# Patient Record
Sex: Female | Born: 1935 | Race: White | Hispanic: No | Smoking: Never smoker
Health system: Southern US, Community
[De-identification: ages and names within clinical notes are randomized; demographics above are authoritative.]

## PROBLEM LIST (undated history)

## (undated) DIAGNOSIS — K644 Residual hemorrhoidal skin tags: Secondary | ICD-10-CM

## (undated) DIAGNOSIS — R609 Edema, unspecified: Secondary | ICD-10-CM

## (undated) DIAGNOSIS — I4891 Unspecified atrial fibrillation: Secondary | ICD-10-CM

## (undated) DIAGNOSIS — R1310 Dysphagia, unspecified: Secondary | ICD-10-CM

## (undated) DIAGNOSIS — G609 Hereditary and idiopathic neuropathy, unspecified: Secondary | ICD-10-CM

## (undated) DIAGNOSIS — H0230 Blepharochalasis unspecified eye, unspecified eyelid: Secondary | ICD-10-CM

## (undated) DIAGNOSIS — I10 Essential (primary) hypertension: Secondary | ICD-10-CM

## (undated) DIAGNOSIS — L723 Sebaceous cyst: Secondary | ICD-10-CM

## (undated) DIAGNOSIS — G2581 Restless legs syndrome: Secondary | ICD-10-CM

## (undated) DIAGNOSIS — G4700 Insomnia, unspecified: Secondary | ICD-10-CM

## (undated) DIAGNOSIS — L719 Rosacea, unspecified: Secondary | ICD-10-CM

## (undated) DIAGNOSIS — R634 Abnormal weight loss: Secondary | ICD-10-CM

## (undated) DIAGNOSIS — R5383 Other fatigue: Secondary | ICD-10-CM

## (undated) DIAGNOSIS — I471 Supraventricular tachycardia: Secondary | ICD-10-CM

## (undated) DIAGNOSIS — R5381 Other malaise: Secondary | ICD-10-CM

## (undated) DIAGNOSIS — L570 Actinic keratosis: Secondary | ICD-10-CM

## (undated) DIAGNOSIS — K602 Anal fissure, unspecified: Secondary | ICD-10-CM

## (undated) DIAGNOSIS — R002 Palpitations: Secondary | ICD-10-CM

## (undated) DIAGNOSIS — IMO0001 Reserved for inherently not codable concepts without codable children: Secondary | ICD-10-CM

## (undated) DIAGNOSIS — R42 Dizziness and giddiness: Secondary | ICD-10-CM

## (undated) DIAGNOSIS — N905 Atrophy of vulva: Secondary | ICD-10-CM

## (undated) DIAGNOSIS — M26629 Arthralgia of temporomandibular joint, unspecified side: Secondary | ICD-10-CM

## (undated) DIAGNOSIS — M2550 Pain in unspecified joint: Secondary | ICD-10-CM

## (undated) DIAGNOSIS — I951 Orthostatic hypotension: Secondary | ICD-10-CM

## (undated) DIAGNOSIS — B351 Tinea unguium: Secondary | ICD-10-CM

## (undated) DIAGNOSIS — K603 Anal fistula: Secondary | ICD-10-CM

## (undated) DIAGNOSIS — K648 Other hemorrhoids: Secondary | ICD-10-CM

## (undated) DIAGNOSIS — I499 Cardiac arrhythmia, unspecified: Secondary | ICD-10-CM

## (undated) DIAGNOSIS — N816 Rectocele: Secondary | ICD-10-CM

## (undated) DIAGNOSIS — E785 Hyperlipidemia, unspecified: Secondary | ICD-10-CM

## (undated) DIAGNOSIS — M79646 Pain in unspecified finger(s): Secondary | ICD-10-CM

## (undated) DIAGNOSIS — I4460 Unspecified fascicular block: Secondary | ICD-10-CM

## (undated) DIAGNOSIS — F070 Personality change due to known physiological condition: Secondary | ICD-10-CM

## (undated) DIAGNOSIS — I4719 Other supraventricular tachycardia: Secondary | ICD-10-CM

## (undated) HISTORY — DX: Insomnia, unspecified: G47.00

## (undated) HISTORY — DX: Anal fissure, unspecified: K60.2

## (undated) HISTORY — DX: Other fatigue: R53.83

## (undated) HISTORY — DX: Edema, unspecified: R60.9

## (undated) HISTORY — DX: Rosacea, unspecified: L71.9

## (undated) HISTORY — DX: Residual hemorrhoidal skin tags: K64.4

## (undated) HISTORY — DX: Other malaise: R53.81

## (undated) HISTORY — DX: Abnormal weight loss: R63.4

## (undated) HISTORY — DX: Atrophy of vulva: N90.5

## (undated) HISTORY — DX: Reserved for inherently not codable concepts without codable children: IMO0001

## (undated) HISTORY — PX: TONSILLECTOMY: SUR1361

## (undated) HISTORY — DX: Tinea unguium: B35.1

## (undated) HISTORY — DX: Unspecified atrial fibrillation: I48.91

## (undated) HISTORY — DX: Restless legs syndrome: G25.81

## (undated) HISTORY — DX: Pain in unspecified joint: M25.50

## (undated) HISTORY — DX: Palpitations: R00.2

## (undated) HISTORY — DX: Blepharochalasis unspecified eye, unspecified eyelid: H02.30

## (undated) HISTORY — DX: Hereditary and idiopathic neuropathy, unspecified: G60.9

## (undated) HISTORY — DX: Other hemorrhoids: K64.8

## (undated) HISTORY — DX: Orthostatic hypotension: I95.1

## (undated) HISTORY — DX: Dizziness and giddiness: R42

## (undated) HISTORY — DX: Dysphagia, unspecified: R13.10

## (undated) HISTORY — DX: Essential (primary) hypertension: I10

## (undated) HISTORY — DX: Rectocele: N81.6

## (undated) HISTORY — DX: Pain in unspecified finger(s): M79.646

## (undated) HISTORY — PX: ADENOIDECTOMY: SUR15

## (undated) HISTORY — DX: Sebaceous cyst: L72.3

## (undated) HISTORY — DX: Supraventricular tachycardia: I47.1

## (undated) HISTORY — DX: Hyperlipidemia, unspecified: E78.5

## (undated) HISTORY — DX: Other supraventricular tachycardia: I47.19

## (undated) HISTORY — DX: Cardiac arrhythmia, unspecified: I49.9

## (undated) HISTORY — DX: Unspecified fascicular block: I44.60

## (undated) HISTORY — DX: Actinic keratosis: L57.0

## (undated) HISTORY — DX: Arthralgia of temporomandibular joint, unspecified side: M26.629

## (undated) HISTORY — DX: Personality change due to known physiological condition: F07.0

## (undated) HISTORY — DX: Anal fistula: K60.3

---

## 1996-08-14 HISTORY — PX: CYST EXCISION: SHX5701

## 1996-08-14 HISTORY — PX: BLADDER SUSPENSION: SHX72

## 1998-02-16 ENCOUNTER — Other Ambulatory Visit: Admission: RE | Admit: 1998-02-16 | Discharge: 1998-02-16 | Payer: Self-pay | Admitting: *Deleted

## 1998-05-20 ENCOUNTER — Ambulatory Visit (HOSPITAL_COMMUNITY): Admission: RE | Admit: 1998-05-20 | Discharge: 1998-05-20 | Payer: Self-pay | Admitting: Obstetrics & Gynecology

## 1999-02-08 ENCOUNTER — Other Ambulatory Visit: Admission: RE | Admit: 1999-02-08 | Discharge: 1999-02-08 | Payer: Self-pay | Admitting: *Deleted

## 1999-08-15 HISTORY — PX: TOTAL ABDOMINAL HYSTERECTOMY: SHX209

## 1999-11-14 ENCOUNTER — Other Ambulatory Visit: Admission: RE | Admit: 1999-11-14 | Discharge: 1999-11-14 | Payer: Self-pay | Admitting: *Deleted

## 1999-11-16 ENCOUNTER — Encounter: Payer: Self-pay | Admitting: *Deleted

## 1999-11-16 ENCOUNTER — Encounter: Admission: RE | Admit: 1999-11-16 | Discharge: 1999-11-16 | Payer: Self-pay | Admitting: *Deleted

## 1999-12-16 ENCOUNTER — Encounter (INDEPENDENT_AMBULATORY_CARE_PROVIDER_SITE_OTHER): Payer: Self-pay | Admitting: Specialist

## 1999-12-16 ENCOUNTER — Other Ambulatory Visit: Admission: RE | Admit: 1999-12-16 | Discharge: 1999-12-16 | Payer: Self-pay | Admitting: *Deleted

## 2000-03-26 ENCOUNTER — Encounter (INDEPENDENT_AMBULATORY_CARE_PROVIDER_SITE_OTHER): Payer: Self-pay | Admitting: Specialist

## 2000-03-26 ENCOUNTER — Inpatient Hospital Stay (HOSPITAL_COMMUNITY): Admission: RE | Admit: 2000-03-26 | Discharge: 2000-03-27 | Payer: Self-pay | Admitting: *Deleted

## 2000-11-19 ENCOUNTER — Other Ambulatory Visit: Admission: RE | Admit: 2000-11-19 | Discharge: 2000-11-19 | Payer: Self-pay | Admitting: *Deleted

## 2004-11-18 DIAGNOSIS — G4700 Insomnia, unspecified: Secondary | ICD-10-CM

## 2004-11-18 HISTORY — DX: Insomnia, unspecified: G47.00

## 2006-08-14 HISTORY — PX: OTHER SURGICAL HISTORY: SHX169

## 2006-11-20 HISTORY — PX: CATARACT EXTRACTION W/ INTRAOCULAR LENS IMPLANT: SHX1309

## 2006-11-21 DIAGNOSIS — R002 Palpitations: Secondary | ICD-10-CM

## 2006-11-21 DIAGNOSIS — R609 Edema, unspecified: Secondary | ICD-10-CM

## 2006-11-21 HISTORY — DX: Palpitations: R00.2

## 2006-11-21 HISTORY — DX: Edema, unspecified: R60.9

## 2006-12-20 ENCOUNTER — Encounter: Payer: Self-pay | Admitting: Internal Medicine

## 2007-02-28 ENCOUNTER — Encounter: Payer: Self-pay | Admitting: Internal Medicine

## 2007-03-22 DIAGNOSIS — I951 Orthostatic hypotension: Secondary | ICD-10-CM

## 2007-03-22 HISTORY — DX: Orthostatic hypotension: I95.1

## 2007-04-03 DIAGNOSIS — R42 Dizziness and giddiness: Secondary | ICD-10-CM

## 2007-04-03 HISTORY — DX: Dizziness and giddiness: R42

## 2008-02-28 DIAGNOSIS — M2550 Pain in unspecified joint: Secondary | ICD-10-CM

## 2008-02-28 DIAGNOSIS — K648 Other hemorrhoids: Secondary | ICD-10-CM

## 2008-02-28 DIAGNOSIS — B351 Tinea unguium: Secondary | ICD-10-CM

## 2008-02-28 HISTORY — DX: Tinea unguium: B35.1

## 2008-02-28 HISTORY — DX: Pain in unspecified joint: M25.50

## 2008-02-28 HISTORY — DX: Other hemorrhoids: K64.8

## 2008-07-28 DIAGNOSIS — K644 Residual hemorrhoidal skin tags: Secondary | ICD-10-CM

## 2008-07-28 DIAGNOSIS — K603 Anal fistula, unspecified: Secondary | ICD-10-CM

## 2008-07-28 DIAGNOSIS — K602 Anal fissure, unspecified: Secondary | ICD-10-CM

## 2008-07-28 HISTORY — DX: Anal fistula, unspecified: K60.30

## 2008-07-28 HISTORY — DX: Anal fissure, unspecified: K60.2

## 2008-07-28 HISTORY — DX: Residual hemorrhoidal skin tags: K64.4

## 2009-01-05 DIAGNOSIS — IMO0001 Reserved for inherently not codable concepts without codable children: Secondary | ICD-10-CM

## 2009-01-05 HISTORY — DX: Reserved for inherently not codable concepts without codable children: IMO0001

## 2009-06-24 ENCOUNTER — Encounter: Payer: Self-pay | Admitting: Internal Medicine

## 2009-10-29 LAB — HM DEXA SCAN

## 2009-11-10 ENCOUNTER — Encounter: Payer: Self-pay | Admitting: Internal Medicine

## 2009-11-10 DIAGNOSIS — I499 Cardiac arrhythmia, unspecified: Secondary | ICD-10-CM

## 2009-11-10 HISTORY — DX: Cardiac arrhythmia, unspecified: I49.9

## 2009-11-18 ENCOUNTER — Encounter: Payer: Self-pay | Admitting: Internal Medicine

## 2009-11-23 DIAGNOSIS — E785 Hyperlipidemia, unspecified: Secondary | ICD-10-CM | POA: Insufficient documentation

## 2009-11-23 DIAGNOSIS — I10 Essential (primary) hypertension: Secondary | ICD-10-CM | POA: Insufficient documentation

## 2009-11-23 DIAGNOSIS — I498 Other specified cardiac arrhythmias: Secondary | ICD-10-CM

## 2009-11-23 DIAGNOSIS — I4891 Unspecified atrial fibrillation: Secondary | ICD-10-CM

## 2009-11-24 ENCOUNTER — Ambulatory Visit: Payer: Self-pay | Admitting: Internal Medicine

## 2009-11-25 ENCOUNTER — Telehealth: Payer: Self-pay | Admitting: Internal Medicine

## 2009-12-15 ENCOUNTER — Telehealth: Payer: Self-pay | Admitting: Internal Medicine

## 2009-12-30 DIAGNOSIS — M26629 Arthralgia of temporomandibular joint, unspecified side: Secondary | ICD-10-CM

## 2009-12-30 HISTORY — DX: Arthralgia of temporomandibular joint, unspecified side: M26.629

## 2010-01-18 ENCOUNTER — Encounter: Payer: Self-pay | Admitting: Internal Medicine

## 2010-07-26 ENCOUNTER — Encounter
Admission: RE | Admit: 2010-07-26 | Discharge: 2010-07-26 | Payer: Self-pay | Source: Home / Self Care | Attending: Internal Medicine | Admitting: Internal Medicine

## 2010-09-13 NOTE — Progress Notes (Signed)
Summary: Patient's at Home Med List and Med Hx  Patient's at Home Med List and Med Hx   Imported By: Marylou Mccoy 01/19/2010 14:10:55  _____________________________________________________________________  External Attachment:    Type:   Image     Comment:   External Document

## 2010-09-13 NOTE — Progress Notes (Signed)
Summary: pt has questions  Phone Note Call from Patient Call back at Home Phone [phone]   Caller: Patient Reason for Call: Talk to Nurse, Talk to Doctor Summary of Call: pt wants to know do you think the ablation is her best option and is there another alternative that would suit her better  Initial call taken by: Omer Jack,  November 25, 2009 9:03 AM  Follow-up for Phone Call        spoke with husband and will have wife call me when she is out of water arerobics. Dennis Bast, RN, BSN  November 25, 2009 10:44 AM will wait until she gets back from vacation to talk to doctor to discuss medicatons vs. ablation. Dr Johney Frame suggested Multaq.  We will review her monitor and I will call her tonight with the results Dennis Bast, RN, BSN  November 25, 2009 1:09 PM  returning call, Migdalia Dk  November 25, 2009 2:56 PM

## 2010-09-13 NOTE — Letter (Signed)
Summary: Discharge Summary  Discharge Summary   Imported By: Marylou Mccoy 01/19/2010 14:09:23  _____________________________________________________________________  External Attachment:    Type:   Image     Comment:   External Document

## 2010-09-13 NOTE — Progress Notes (Signed)
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

## 2010-09-13 NOTE — Consult Note (Signed)
Summary: [facility]  [facility]   Imported By: Marylou Mccoy 01/19/2010 14:08:15  _____________________________________________________________________  External Attachment:    Type:   Image     Comment:   External Document

## 2010-09-13 NOTE — Consult Note (Signed)
Summary: [facility]  [facility]   Imported By: Marylou Mccoy 01/19/2010 13:55:48  _____________________________________________________________________  External Attachment:    Type:   Image     Comment:   External Document

## 2010-09-13 NOTE — Letter (Signed)
Summary: ELectrophysiology/Ablation Procedure Instructions  [facility], Main Office  [address]   [place], [name] [address]   Phone: [phone]  Fax: [phone]     Electrophysiology/Ablation Procedure Instructions    You are scheduled for a(n) SVT ablation on 12/30/09 at 7:30am with Dr. Johney Frame.  1.  Please come to the Short Stay Center at [facility] at 5:30am on the day of your procedure.  2.  Come prepared to stay overnight.   Please bring your insurance cards and a list of your medications.  3.  Come to the [place] office on 5/12/11at 2:30pm for lab work.  The lab at [facility] is open from 8:30 AM to 1:30 PM and 2:30 PM to 5:00 PM.   You do not have to be fasting.  4.  Do not have anything to eat or drink after midnight the night before your procedure.  5.   All of your remaining medications may be taken with a small amount of water.  6.  Educational material received:     _____ Ablation   * Occasionally, EP studies and ablations can become lengthy.  Please make your family aware of this before your procedure starts.  Average time ranges from 2-8 hours for EP studies/ablations.  Your physician will locate your family after the procedure with the results.  * If you have any questions after you get home, please call the office at [phone].  Anselm Pancoast

## 2010-09-13 NOTE — Assessment & Plan Note (Signed)
Summary: appt @3 :15/NEP/   Visit Type:  Initial Consult Referring Provider:  Dr Garen Lah Primary Provider:  Varney Daily, MD   History of Present Illness: Rebekah Graham is a pleasant 77 yo WF with a h/o atrial tachycardia and atrial fibrillation s/p atrial tachycardia ablation at [facility] 2008 who now presents for EP consultation regarding recurrent atrial tachycardia.  She reports having symptoms of palpitations several years ago for which she was evaluated and found to have atrial tachycardia.  She was found to have a right free wall atrial tachycardia ablated by Dr Macon Large.  Unfortunately, she developed return of tachycardia prior to discharge and was placed on flecainide.  She reports intolerance to flecainide due to dizziness. She has had occasional palpitations.  Recently, she was found to have sustained tachycardia, with HR 110s.  She reports symptoms of fatigue and palpitations.  She now presents for EP consultation.  Current Medications (verified): 1)  Evista 60 Mg Tabs (Raloxifene Hcl) .... Take One Tablet By Mouth Once Daily. 2)  Warfarin Sodium 5 Mg Tabs (Warfarin Sodium) .... Use As Directed By Anticoagulation Clinic 3)  Aspirin 81 Mg Tbec (Aspirin) .... Take One Tablet By Mouth Daily 4)  Lisinopril 20 Mg Tabs (Lisinopril) .... Take One Tablet By Mouth Daily 5)  Simvastatin 40 Mg Tabs (Simvastatin) .... Take One Tablet By Mouth Daily At Bedtime 6)  Multivitamins   Tabs (Multiple Vitamin) .... Once Daily 7)  Vitamin B-12 100 Mcg Tabs (Cyanocobalamin) .... Once Daily 8)  Vitamin C 1000 Mg Tabs (Ascorbic Acid) .... Once Daily 9)  Grape Seed 50 Mg Caps (Grape Seed) .... Once Daily 10)  Calcium 600 1500 Mg Tabs (Calcium Carbonate) .... 3 Per Week 11)  Vitamin D 1000 Unit Tabs (Cholecalciferol) .... Once Daily 12)  Magnesium 325mg  Powder .... Once Daily 13)  Turmeric Curcumin  Caps (Misc Natural Products) .... Once Daily 14)  Fish Oil   Oil (Fish Oil) .... Once Daily 15)  Metamucil 30.9 %  Powd (Psyllium) .... Uad  Allergies (verified): 1)  ! Flecainide Acetate  Past History:  Past Medical History: Atrial tachycardia s/p ablation 2008 at [facility] (R free wall [place]) ATRIAL FIBRILLATION (ICD-427.31) DYSLIPIDEMIA (ICD-272.4) HYPERTENSION (ICD-401.9)    Past Surgical History: catheter ablation for atrial tachycardia 2008 [facility]  Family History: mother had alcoholism and CHF related to this  Social History: Lives in [place] with spouse.  Retired Runner, broadcasting/film/video.  Tob- none ETOH- 1-2 glasses of wine/day  Review of Systems       All systems are reviewed and negative except as listed in the HPI.   Vital Signs:  Patient profile:   77 year old female Height:      65 inches Weight:      145 pounds BMI:     24.22 Pulse rate:   112 / minute BP sitting:   156 / 92  (left arm)  Vitals Entered By: Laurance Flatten CMA (November 24, 2009 3:02 PM)  Physical Exam  General:  Well developed, well nourished, in no acute distress. Head:  normocephalic and atraumatic Eyes:  PERRLA/EOM intact; conjunctiva and lids normal. Mouth:  Teeth, gums and palate normal. Oral mucosa normal. Neck:  Neck supple, no JVD. No masses, thyromegaly or abnormal cervical nodes. Lungs:  Clear bilaterally to auscultation and percussion. Heart:  tachy regular rhythm, no m/r/g Abdomen:  Bowel sounds positive; abdomen soft and non-tender without masses, organomegaly, or hernias noted. No hepatosplenomegaly. Msk:  Back normal, normal gait. Muscle strength and tone normal.  Pulses:  pulses normal in all 4 extremities Extremities:  No clubbing or cyanosis. Neurologic:  Alert and oriented x 3. Skin:  Intact without lesions or rashes. Cervical Nodes:  no significant adenopathy Psych:  Normal affect.   Echocardiogram  Procedure date:  12/20/2006  Findings:      LA dimension: 3.5 cm  Interpretation Summary  The left ventricle is normal in structure and function. Ejection Fraction =>55%. There is mild mitial  annular calcification.  Rebekah Pert, MD, [facility]  Nuclear Study  Procedure date:  12/20/2006  Findings:      Impression  The post stress myocardial perfusion images show  a normal pattern of perfusion in the all regions. THe post stress left ventricle is normal.  Exercise capacity 6 METS. EKG is negative for ischemia.  No prior study available for comparision. This is a low risk scan.  Rebekah Guadalajara, MD, [facility]       EKG  Procedure date:  11/18/2009  Findings:      atrial tachycardia (103 bpm),  P wave morphology is negative in the inferior leads, positive in VR, and isoelectric in I and V1.  RsR'  Impression & Recommendations:  Problem # 1:  ATRIAL TACHYCARDIA (ICD-427.89) I have reviewed EKGs from 11/18/09 which document atrial tachycardia.  I do not have recent documentation of atrial fibrillation.  The patient wore a holter monitor this past weekend, however upon calling Dr Gretta Began office, the monitor is presently not available.  We will attempt to obtain monitor results when available. Therapeutic strategies for SVT including medicine and ablation were discussed in detail with the patient today. Risk, benefits, and alternatives to EP study and radiofrequency ablation were also discussed in detail today. These risks include but are not limited to stroke, bleeding, vascular damage, tamponade, perforation, damage to the esophagus, lungs, and other structures, possible pacemaker implantation, pulmonary vein stenosis, worsening renal function, and death. The patient understands these risk and wishes to proceed.  We will therefore proceed with Carto guided EP study and ablation at the next available time.  Problem # 2:  ATRIAL FIBRILLATION (ICD-427.31) Continue coumadin We will consider catheter ablation of afib at the same time as SVT ablation above pending results of her recent holter monitor and also results of EP study  Problem # 3:  HYPERTENSION (ICD-401.9) stable Her  updated medication list for this problem includes:    Aspirin 81 Mg Tbec (Aspirin) .Marland Kitchen... Take one tablet by mouth daily    Lisinopril 20 Mg Tabs (Lisinopril) .Marland Kitchen... Take one tablet by mouth daily  Problem # 4:  DYSLIPIDEMIA (ICD-272.4) stable Her updated medication list for this problem includes:    Simvastatin 40 Mg Tabs (Simvastatin) .Marland Kitchen... Take one tablet by mouth daily at bedtime

## 2010-09-13 NOTE — Procedures (Signed)
Summary: Cardionet Holter Report  Cardionet Holter Report   Imported By: Roderic Ovens 12/22/2009 14:21:03  _____________________________________________________________________  External Attachment:    Type:   Image     Comment:   External Document

## 2010-12-30 NOTE — Op Note (Signed)
[facility]  Patient:    Rebekah Graham, Rebekah Graham                       MRN: [id number] Proc. Date: 03/26/00 Adm. Date:  [id number] Attending:  Marin Comment CC:         Heather Roberts, M.D.  Pershing Cox, M.D.   Operative Report  PREOPERATIVE DIAGNOSES:  Postmenopausal bleeding and submucosal myoma (new growth.)  POSTOPERATIVE DIAGNOSES:  Postmenopausal bleeding and submucosal myoma (new growth.)  OPERATION:  Laparoscopic-assisted vaginal hysterectomy and bilateral salpingo-oophorectomy.  SURGEON:  Pershing Cox, M.D.  ASSISTANT:  Silverio Lay, M.D.  ESTIMATED BLOOD LOSS: 100 cc.  FLUIDS:  2100 cc.  URINE OUTPUT:  Less than 100 cc.  COMPLICATIONS:  None.  SPECIMENS:  Uterus, fallopian tubes and ovaries.  INDICATIONS:  Rebekah Graham is a 77 year old postmenopausal patient who was placed on Evista.  She has recently begun to experience postmenopausal bleeding.  Sonogram led to hysterosonogram which showed a very broad-based uterine myoma arising from the posterior uterine wall.  She had previously had a hysteroscopy in 1995 which showed no evidence of filling defects.  Because of the new growth of this mass and the postmenopausal bleeding on a SERM, a decision was made to bring her to the operating room for hysterectomy. Endometrial biopsy on Dec 15, 1999, showed only benign endometrium.  OPERATIVE FINDINGS:  On exam under anesthesia, the uterus made no descent into the vagina.  The uterus itself was approximately eight weeks in size and there were no adnexal masses palpated.  On hysteroscopic visualization, both ovaries were atrophic.  There were multiple small submucosal myomas noted on the uterus.  The appendix was in a normal position and was normal in appearance. There was no evidence of adhesive disease in the patients pelvis on laparoscopic visualization of the mid to lower abdomen.  DESCRIPTION OF PROCEDURE:   [name] was brought to the operating room with an IV in place.  She had received 1 g of Ancef in the holding area.  She was placed supine on the operating room table and general endotracheal anesthesia was administered without difficulty.  She was then placed into Allen stirrups and exam under anesthesia was performed.  The abdomen including umbilicus, perineum and vagina were prepped with a solution of Hibiclens.  The Foley catheter was sterilely inserted into the urethra.  The patient was draped for an abdominal and vaginal procedure.  The curvilinear incision was made at the base of the umbilicus.  Kelly clamp was used to spread the subcutaneous tissues.  The abdominal wall was lifted and a Veress needle was easily inserted into the peritoneal cavity with confirmation of its normal position by hanging drop technique and normal pressures when gas was attached.  The CO2 gas was used to insufflate the abdominal wall until a pressure of 25 had been reached.  The 10-11 trocar was then easily inserted into the pneumoperitoneum and immediate visualization showed [place] to be in the proper intraperitoneal position.  There was no evidence of laceration to underlying structures.  The patient was placed into Trendelenburg.  In preparing her for this procedure, a bivalve speculum had been inserted into the vagina.  The cervix had been visualized and a Hulka tenaculum was inserted into the uterus for uterine manipulation.  By manipulating the uterus from side-to-side it was clear that we would be able to do this procedure using the laparoscopic technique.  Using transabdominal illumination to  avoid the epigastric vessels, 5 mm ports were placed both in the right and left lower quadrants.  These ports were used for the passing of grasping forceps and the tripolar cautery which was used for all of our dissection in this procedure.  Starting first on the patients right, the round ligament was  lifted and cauterized and then incised.  The technique used by both surgeons on both sides of the pelvis was a triple cautery first to the lateral sides and then in the middle followed by an incision in the middle.  Once the round ligament had been divided, we carefully inspected the pelvis for the ureters.  They were deep in the pelvis and not anywhere near the IP ligaments.  The IP ligaments were lifted with the tripolar, triply cauterized and cut.  We then began the incision along the posterior broad ligament lifting the ovary and extending the cautery until we reached the surface of the uterus.  On each side, we were very close to the presumed internal os and we terminated the dissection.  At this point, both ovaries had been removed from their attachment.  the round ligament was freed. The bladder was lifted and sharp scissors were used to open the peritoneal flap overlying the lower uterine segment.  With careful lifting, the subcutaneous tissues could be dissected with these scissors pushing the bladder down off the lower uterine segment.  At this point, we terminated the laparoscopic procedure.  The gas was evacuated from the peritoneal cavity and the patients leg were lifted up in the supportive Allen stirrups.  The patients vagina was quite small making the dissection through the vagina quite difficul and confirming our impression that this uterus could not have been safely removed using a vaginal procedure. Using both lateral Deavers and a posterior Deaver the cervix was visualized and the Hulka tenaculum was replaced with a single-tooth tenaculum. Vasopressin diluted 1:100 was injected into the cervical stroma circumferentially.  Approximately 15 cc of this solution was used.  A knife was then used to score the paracervical vaginal mucosa.  Mayo scissors were used to dissect the mucosa from the cervix.  An incision was made in the posterior vaginal wall near the cervix, and  we easily  entered the peritoneal cavity. It was still impossible to place a decent posterior retractor because there was such limited space.  Heaney and then Rogers clamps were placed on the uterosacral ligaments which were transected, suture ligated using Heaney technique and then held with a clamp.  Once these sutures had been placed, it was then possible to put the long needle nosed weighted vaginal speculum into the peritoneal cavity, and we had better exposure.  Next, the vaginal mucosa was lifted and a space between the bladder and uterus was created using our previous dissection from above.  This was quite simple.  A Beaver retractor was placed beneath the bladder to lift it. Using a series of Rogers clamps, the pericervical attachments to the cardinal ligament were separated using Heaney technique.  Each of these sutures were suture ligated.  The uterine arteries were taken as a separate bundle.  This stitch was not Heaney ligated.  Once the uterine arteries had been ligated we were near the limits of our cautery from above.  The ovary and fallopian tube were lifted and the residual peritoneum was clamped, cut and suture ligated and the uterus was then removed. This was sent to pathology for permanent section diagnosis.  On the patients left,  there was some bleeding from the uterosacral ligament.  This was secured with a figure-of-eight suture.  The remainder of the vaginal cuff was carefully inspected, and there was no evidence of bleeding.  A McCall culdoplasty was performed by suturing the uterosacral ligaments, reefing between the uterosacral ligaments and then tying the suture firmly.  Later after the vaginal closure, the uterosacral ligaments were tied together.  The vagina was closed from side-to-side using figure-of-eight sutures and packing was placed.  The surgeons and the surgical technician then changed their gloves and we went above.  The laparoscope was reintroduced  into the abdominal cavity and gas was used to reinflate.  We carefully inspected the vaginal cuff.  Several very tiny bleeders were cauterized using the tripolar.  Hemostasis was excellent. We irrigated carefully with the Nezhat, and there was no evidence of bleeding. The appendix was inspected.  At this point, we used the Nezhat to evacuate the pneumoperitoneum watching closely for any signs of bleeding from the cuff after the pressure from the pneumoperitoneum was released.  There was no sign of bleeding.  The gas was reinstated and under direct inspection two 5 mm ports were removed, and there was no evidence of bleeding from epigastric vessels.  Under direct visualization, the 11-12 port was removed as the gas was allowed to escape from the cavity.  The subumbilical port was the only 11-12 port.  The fascia was approximated as carefully as possible using a 0 Vicryl on a UR6 needle.  The skin edges were allowed to fall together, and Steri-Strips were placed.  Over the 5 mm ports, 4-0 Vicryl was used to approximate the subcutaneous tissues, and Steri-Strips were applied. DD:  03/26/00 TD:  03/26/00 Job: [id number]

## 2010-12-30 NOTE — Discharge Summary (Signed)
[facility]  Patient:    Rebekah Graham, Rebekah Graham                       MRN: [id number] Adm. Date:  [id number] Disc. Date: [id number] Attending:  Marin Comment CC:         Heather Roberts, M.D.   Discharge Summary  ADMITTING DIAGNOSES:  Postmenopausal bleeding and submucous myoma of the uterus.  For details of the patients admission history and physical, please see the note dated and transcribed March 26, 2000.  REASON FOR ADMISSION:  The patient is 45.  She has been on Evista which should not stimulate the endometrium and recently experienced postmenopausal bleeding.  Sonogram showed a broad-based uterine myoma arising from the posterior uterine wall.  The patient had had a procedure in 1995 with hysteroscopy, and there was no evidence of filling defect at this time. Because of the new growth in the postmenopausal period, there was some concern about this being a nonbenign lesion and with her history of postmenopausal bleeding, decision was made to proceed with hysterectomy.  HOSPITAL COURSE:  The patient was brought to the operating room on March 26, 2000, and under general anesthesia, laparoscopic assisted vaginal hysterectomy and bilateral salpingo-oophorectomy was performed.  The patient had an estimated blood loss of 100 cc.  On the evening of her surgery, she was up walking in the hall; her Foley was out.  Her packing was out.  She had no nausea.  She was started on liquids and p.o. pain medication.  She consumed a regular diet on the morning of postoperative day #1.  She had a low-grade temperature on that day of 99.3 and was able to void spontaneously.  She had had some bleeding from her Foley catheter prior to its removal, and there was concern that she might have retained a blood clot.  She was asked to push p.o. fluids and later in the day was able to void without difficulty.  She was able to be discharged later in the afternoon on August  14.  DISCHARGE INSTRUCTIONS:  She was given a form with [facility] discharge instructions regarding activity, diet, and pain medication.  She was given prescriptions for Darvocet and Tylox to use if needed for pain relief.  LABORATORY:  Hemoglobin on the morning of discharge was 12.4.  It had been 14.1 preoperatively.  PATHOLOGY:  Final pathology is now available regarding this patients uterus. All of the tissues that were noted were benign.  There was a 4.3 cm submucosal leiomyoma and several other smaller intramural leiomyoma.  The fallopian tubes and ovaries were normal. DD:  04/07/00 TD:  04/09/00 Job: [id number]

## 2011-02-06 ENCOUNTER — Encounter: Payer: Self-pay | Admitting: Internal Medicine

## 2011-03-01 DIAGNOSIS — L723 Sebaceous cyst: Secondary | ICD-10-CM

## 2011-03-01 DIAGNOSIS — H0230 Blepharochalasis unspecified eye, unspecified eyelid: Secondary | ICD-10-CM

## 2011-03-01 HISTORY — DX: Sebaceous cyst: L72.3

## 2011-03-01 HISTORY — DX: Blepharochalasis unspecified eye, unspecified eyelid: H02.30

## 2011-09-19 DIAGNOSIS — H0230 Blepharochalasis unspecified eye, unspecified eyelid: Secondary | ICD-10-CM | POA: Insufficient documentation

## 2011-09-19 DIAGNOSIS — H11003 Unspecified pterygium of eye, bilateral: Secondary | ICD-10-CM | POA: Insufficient documentation

## 2011-09-19 DIAGNOSIS — H269 Unspecified cataract: Secondary | ICD-10-CM | POA: Insufficient documentation

## 2011-09-19 DIAGNOSIS — H11009 Unspecified pterygium of unspecified eye: Secondary | ICD-10-CM | POA: Insufficient documentation

## 2011-09-19 DIAGNOSIS — H179 Unspecified corneal scar and opacity: Secondary | ICD-10-CM | POA: Insufficient documentation

## 2011-10-13 LAB — HM MAMMOGRAPHY: HM Mammogram: NEGATIVE

## 2012-01-17 DIAGNOSIS — R1310 Dysphagia, unspecified: Secondary | ICD-10-CM

## 2012-01-17 HISTORY — DX: Dysphagia, unspecified: R13.10

## 2012-03-05 DIAGNOSIS — R634 Abnormal weight loss: Secondary | ICD-10-CM

## 2012-03-05 DIAGNOSIS — R5381 Other malaise: Secondary | ICD-10-CM

## 2012-03-05 HISTORY — DX: Other malaise: R53.81

## 2012-03-05 HISTORY — DX: Abnormal weight loss: R63.4

## 2012-09-23 DIAGNOSIS — N905 Atrophy of vulva: Secondary | ICD-10-CM

## 2012-09-23 DIAGNOSIS — L570 Actinic keratosis: Secondary | ICD-10-CM

## 2012-09-23 DIAGNOSIS — N816 Rectocele: Secondary | ICD-10-CM

## 2012-09-23 DIAGNOSIS — L719 Rosacea, unspecified: Secondary | ICD-10-CM

## 2012-09-23 HISTORY — DX: Rectocele: N81.6

## 2012-09-23 HISTORY — DX: Rosacea, unspecified: L71.9

## 2012-09-23 HISTORY — DX: Atrophy of vulva: N90.5

## 2012-09-23 HISTORY — DX: Actinic keratosis: L57.0

## 2012-11-20 ENCOUNTER — Encounter: Payer: Self-pay | Admitting: Geriatric Medicine

## 2012-11-20 ENCOUNTER — Ambulatory Visit: Payer: Self-pay | Admitting: Geriatric Medicine

## 2012-11-20 ENCOUNTER — Non-Acute Institutional Stay: Payer: Medicare Other | Admitting: Geriatric Medicine

## 2012-11-20 VITALS — BP 148/88 | HR 62 | Temp 97.7°F | Wt 143.0 lb

## 2012-11-20 DIAGNOSIS — M79644 Pain in right finger(s): Secondary | ICD-10-CM

## 2012-11-20 DIAGNOSIS — M79609 Pain in unspecified limb: Secondary | ICD-10-CM

## 2012-11-20 DIAGNOSIS — M79646 Pain in unspecified finger(s): Secondary | ICD-10-CM | POA: Insufficient documentation

## 2012-11-20 HISTORY — DX: Pain in unspecified finger(s): M79.646

## 2012-11-20 NOTE — Assessment & Plan Note (Addendum)
Right thumb pain and tenderness, no specific injury but possibly overuse from barbells and writing. Appears more tendinitis rather than first urethritis. Treatments to reduce inflammation: Ice t.i.d. x3 days then daily as needed. Aleve 220 mg twice a day x3 days

## 2012-11-20 NOTE — Progress Notes (Signed)
Patient ID: Rebekah Graham, female   DOB: 12-11-1928, 79 y.o.   MRN: [id number] Chief Complaint  Patient presents with  . Hand Problem    thumb on right hand pain, swollen, can't bend thumb, worse past 3-4 days    HPI This 79 year old female resident of [facility] independent living [facility], presents to clinic this afternoon with right thumb that has become progressively more painful, swollen with decreased range of motion over the past 3-4 days. Patient denies any specific injury. 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 Is right handed.  Allergies Reviewed   Medications Reviewed   Review of Systems   DATA OBTAINED: from patient,  GENERAL: Feels well. No fevers, fatigue, change in activity status. No change in appetite, weight  SKIN: No itch, rash or open wounds MOUTH/THROAT: No mouth or tooth pain. No difficulty chewing or swallowing. RESPIRATORY: No cough, wheezing, SOB CARDIAC: No chest pain, palpitations. No edema. MUSCULOSKELETAL: Other than right thumb, No joint pain, swelling or stiffness. No back pain. No muscle ache, pain, weakness. Gait is steady. No recent falls.  NEUROLOGIC: No dizziness, fainting, headache, imbalance, numbness. No change in mental status.  PSYCHIATRIC: No anxiety, depression, behavior issue. Sleeps well.   Physical Exam  Filed Vitals:   11/20/12 1705  BP: 148/88  Pulse: 62  Temp: 97.7 F (36.5 C)   Filed Weights   11/20/12 1705  Weight: 143 lb (64.864 kg)   GENERAL APPEARANCE: No acute distress, appropriately groomed, normal body habitus. Alert, pleasant, conversant. HEAD: Normocephalic, atraumatic EYES: Conjunctiva/lids clear. RESPIRATORY: Breathing is even, unlabored.  MUSCULOSKELETAL: Moves all extremities with full ROM, strength and tone. Back is without kyphosis, scoliosis or spinal process tenderness. Gait is steady  Right thumb is mildly swollen andt  is tender at the first joint. ROM is ,imited (extension) NEUROLOGIC: Oriented to time, place, person.  PSYCHIATRIC: Mood and affect appropriate to situation  ASSESSMENT/PLAN  Thumb pain Right thumb pain and tenderness, specific injury but possibly overuse from barbells and writing. Appears more tendinitis rather than first urethritis. Treatments to reduce inflammation: Ice t.i.d. x3 days then daily as needed. Aleve 220 mg twice a day x3 days   Follow up: AS scheduled or as needed  Kyheem Bathgate T.Erby Sanderson, NP-C 11/20/2012

## 2012-11-24 ENCOUNTER — Encounter: Payer: Self-pay | Admitting: Geriatric Medicine

## 2012-11-24 MED ORDER — NAPROXEN SODIUM 220 MG PO TABS: 220.0000 mg | ORAL_TABLET | Freq: Two times a day (BID) | ORAL | Status: AC

## 2013-01-28 DIAGNOSIS — H02109 Unspecified ectropion of unspecified eye, unspecified eyelid: Secondary | ICD-10-CM | POA: Insufficient documentation

## 2013-03-04 ENCOUNTER — Other Ambulatory Visit: Payer: Self-pay | Admitting: Geriatric Medicine

## 2013-03-04 ENCOUNTER — Other Ambulatory Visit: Payer: Self-pay | Admitting: Internal Medicine

## 2013-03-04 DIAGNOSIS — Z85828 Personal history of other malignant neoplasm of skin: Secondary | ICD-10-CM | POA: Insufficient documentation

## 2013-03-04 MED ORDER — METOPROLOL SUCCINATE ER 50 MG PO TB24: ORAL_TABLET | ORAL | Status: DC

## 2013-03-05 ENCOUNTER — Other Ambulatory Visit: Payer: Self-pay | Admitting: Geriatric Medicine

## 2013-03-05 MED ORDER — METOPROLOL TARTRATE 50 MG PO TABS: 50.0000 mg | ORAL_TABLET | Freq: Three times a day (TID) | ORAL | Status: DC

## 2013-03-19 DIAGNOSIS — Z961 Presence of intraocular lens: Secondary | ICD-10-CM | POA: Insufficient documentation

## 2013-03-24 ENCOUNTER — Non-Acute Institutional Stay: Payer: Medicare Other | Admitting: Internal Medicine

## 2013-03-24 ENCOUNTER — Encounter: Payer: Self-pay | Admitting: Internal Medicine

## 2013-03-24 VITALS — BP 138/98 | HR 62 | Ht 65.0 in | Wt 144.0 lb

## 2013-03-24 DIAGNOSIS — I10 Essential (primary) hypertension: Secondary | ICD-10-CM

## 2013-03-24 DIAGNOSIS — L719 Rosacea, unspecified: Secondary | ICD-10-CM

## 2013-03-24 DIAGNOSIS — I4891 Unspecified atrial fibrillation: Secondary | ICD-10-CM

## 2013-03-24 DIAGNOSIS — G2581 Restless legs syndrome: Secondary | ICD-10-CM | POA: Insufficient documentation

## 2013-03-24 DIAGNOSIS — R609 Edema, unspecified: Secondary | ICD-10-CM

## 2013-03-24 DIAGNOSIS — G4700 Insomnia, unspecified: Secondary | ICD-10-CM

## 2013-03-24 DIAGNOSIS — E785 Hyperlipidemia, unspecified: Secondary | ICD-10-CM | POA: Insufficient documentation

## 2013-03-24 NOTE — Progress Notes (Signed)
Subjective:    Patient ID: Rebekah Graham, female    DOB: 29-May-1929, 79 y.o.   MRN: [id number]  HPI  Had lower eyelid surgery on 03/11/13 at [facility], Dr. . Preparing to have surgery on the upper lids for blepharochalasia.  HTN (hypertension): controlled  Hyperlipidemia: controlled  Restless legs syndrome (RLS): unchanged.  Rosacea: imporoved  Edema: improved  Insomnia, unspecified  Atrial fibrillation: on Pradaxa.  Memory loss. Has some trouble with keeping track of what is happening each day. Trouble with names.   Current Outpatient Prescriptions on File Prior to Visit  Medication Sig Dispense Refill  . Calcium Carbonate (CALCIUM 600) 1500 MG TABS Take 1 tablet by mouth. Twice a week      . cholecalciferol (VITAMIN D) 1000 UNITS tablet Take 1,000 Units by mouth daily.        . dabigatran (PRADAXA) 150 MG CAPS Take 150 mg by mouth every 12 (twelve) hours. Take one twice daily as anticoagulant      . famotidine-calcium carbonate-magnesium hydroxide (PEPCID COMPLETE) 10-800-165 MG CHEW Chew 1 tablet by mouth daily as needed.      . Magnesium Salicylate 325 MG TABS Take 1 tablet by mouth daily.        . metoprolol succinate (TOPROL-XL) 50 MG 24 hr tablet Take one tablet three times daily to control blood pressure and heart rhythm Take with or immediately following a meal.  90 tablet  3  . Misc Natural Products (TURMERIC CURCUMIN) CAPS Take 1 capsule by mouth daily.        . Multiple Vitamin (MULTIVITAMIN) tablet Take 1 tablet by mouth daily.        . simvastatin (ZOCOR) 40 MG tablet Take 40 mg by mouth at bedtime.        . vitamin B-12 (CYANOCOBALAMIN) 100 MCG tablet Take 50 mcg by mouth daily.        Marland Kitchen zolpidem (AMBIEN) 5 MG tablet Take 5 mg by mouth at bedtime as needed for sleep.       No current facility-administered medications on file prior to visit.     Review of Systems  Constitutional: Negative.  Negative for fever and unexpected weight change.  HENT: Negative.    Eyes:       Bilateral blepharochalasia. She is healing from recent lower lid surgery.  Respiratory: Negative.   Cardiovascular: Negative for chest pain and palpitations.       History of AF. Currently in NSR.  Gastrointestinal: Positive for constipation.  Endocrine: Negative.   Genitourinary: Negative.        Urine leakage.  Musculoskeletal: Negative.   Skin:       History of rosacea.  Neurological:       Has noted some changes in memory  Hematological: Negative.   Psychiatric/Behavioral: Negative.        Objective:BP 138/98  Pulse 62  Ht 5\' 5"  (1.651 m)  Wt 144 lb (65.318 kg)  BMI 23.96 kg/m2    Physical Exam  Constitutional: She is oriented to person, place, and time. She appears well-developed and well-nourished.  HENT:  Head: Normocephalic and atraumatic.  Right Ear: External ear normal.  Left Ear: External ear normal.  Nose: Nose normal.  Eyes:  Corrective lenses.  Neck: Neck supple. No JVD present. No tracheal deviation present. No thyromegaly present.  Cardiovascular: Normal rate, regular rhythm, normal heart sounds and intact distal pulses.  Exam reveals no gallop and no friction rub.   No murmur heard. Pulmonary/Chest: Effort normal  and breath sounds normal. No respiratory distress. She has no wheezes. She has no rales.  Abdominal: Bowel sounds are normal. She exhibits no distension and no mass. There is no tenderness.  Musculoskeletal: Normal range of motion. She exhibits no edema and no tenderness.  Lymphadenopathy:    She has no cervical adenopathy.  Neurological: She is alert and oriented to person, place, and time. She has normal reflexes. No cranial nerve deficit. Coordination normal.  Skin: No rash noted. No erythema. No pallor.  Cheeks are slightly red bilaterally  Psychiatric: She has a normal mood and affect. Her behavior is normal. Judgment and thought content normal.     LAB 03/18/13 CMP: normal  Lipids; TC 161. Trig 110, HDL 47, LDL 92      Assessment & Plan:  HTN (hypertension); controlled  Hyperlipidemia: controlled  Restless legs syndrome (RLS): unchanged  Rosacea: improved  Edema: improved  Insomnia, unspecified; improved  Atrial fibrillation: stable. On Pradaxa.

## 2013-03-25 NOTE — Patient Instructions (Signed)
-  continue current medications

## 2013-04-10 ENCOUNTER — Encounter: Payer: Self-pay | Admitting: Internal Medicine

## 2013-05-13 ENCOUNTER — Ambulatory Visit (INDEPENDENT_AMBULATORY_CARE_PROVIDER_SITE_OTHER): Payer: Medicare Other

## 2013-05-13 DIAGNOSIS — Z23 Encounter for immunization: Secondary | ICD-10-CM

## 2013-07-02 ENCOUNTER — Non-Acute Institutional Stay: Payer: Medicare Other | Admitting: Geriatric Medicine

## 2013-07-02 ENCOUNTER — Encounter: Payer: Self-pay | Admitting: Geriatric Medicine

## 2013-07-02 VITALS — BP 160/73 | HR 60 | Temp 97.3°F | Wt 145.0 lb

## 2013-07-02 DIAGNOSIS — R059 Cough, unspecified: Secondary | ICD-10-CM

## 2013-07-02 DIAGNOSIS — R05 Cough: Secondary | ICD-10-CM

## 2013-07-02 NOTE — Progress Notes (Signed)
Patient ID: Rebekah Graham, female   DOB: 1929/02/28, 79 y.o.   MRN: [id number]  [facility] [phone])  Code Status: Jack Quarto Contact Information   Name Relation Home Work Mobile   Portee,[name] Other [phone]        Chief Complaint  Patient presents with  . Cough    deep, coughing up phlegm, wheezing for one week    HPI: This is a 79 y.o. female resident of [facility] Independent [facility]  section.  Evaluation is requested today due to cough.  Patient reports this cough started about a week ago with a dry cough, then progressed to some mild production of phlegm. Also reports some upper airway wheezing. She denies any sore throat, fever, nausea, does have some mild nasal congestion. Has been taking Delsym cough syrup with minimal relief, she has finished one bottle.    Allergies  Allergen Reactions  . Erythromycin Nausea And Vomiting  . Flecainide Acetate     Postural hypotension  . Penicillins   . Simvastatin     Pains in arms and legs       Medication List       This list is accurate as of: 07/02/13 10:15 AM.  Always use your most recent med list.               CALCIUM 600 1500 MG Tabs  Generic drug:  Calcium Carbonate  Take 1 tablet by mouth. Twice a week     cholecalciferol 1000 UNITS tablet  Commonly known as:  VITAMIN D  Take 1,000 Units by mouth daily.     dabigatran 150 MG Caps capsule  Commonly known as:  PRADAXA  Take 150 mg by mouth every 12 (twelve) hours. Take one twice daily as anticoagulant     losartan 100 MG tablet  Commonly known as:  COZAAR  Take 100 mg by mouth daily. Take one tablet daily for blood pressure     Magnesium Salicylate 325 MG Tabs  Take 1 tablet by mouth daily.     metoprolol succinate 50 MG 24 hr tablet  Commonly known as:  TOPROL-XL  Take one tablet three times daily to control blood pressure and heart rhythm Take with or immediately following a meal.     multivitamin  tablet  Take 1 tablet by mouth daily.     PEPCID COMPLETE 10-800-165 MG Chew chewable tablet  Generic drug:  famotidine-calcium carbonate-magnesium hydroxide  Chew 1 tablet by mouth daily as needed.     simvastatin 40 MG tablet  Commonly known as:  ZOCOR  Take 40 mg by mouth at bedtime.     Turmeric Curcumin Caps  Take 1 capsule by mouth daily.     vitamin B-12 100 MCG tablet  Commonly known as:  CYANOCOBALAMIN  Take 50 mcg by mouth daily.       DATA REVIEWED  Radiologic Exams:   Cardiovascular Exams:   Laboratory Studies:      Review of Systems  DATA OBTAINED: from patient,  GENERAL: Feels "OK", a little better than yesterday.   No fevers, fatigue, change in appetite or weight SKIN: No itch, rash or open wounds EYES: No eye pain, dryness or itching  No change in vision EARS: No earache, tinnitus, change in hearing NOSE: Mild congestion, drainage. No bleeding MOUTH/THROAT: No mouth or tooth pain  No sore throat   No difficulty chewing or swallowing RESPIRATORY: Cough, wheezing [present - see HPI  No SOB CARDIAC: No chest  pain, palpitations  No edema. GI: No abdominal pain  No N/V/D or constipation  No heartburn or reflux  PSYCHIATRIC: No feelings of anxiety, depression Sleeps well.  No behavior issue.    Physical Exam Filed Vitals:   07/02/13 0950  BP: 160/73  Pulse: 60  Temp: 97.3 F (36.3 C)  TempSrc: Oral  Weight: 145 lb (65.772 kg)   Body mass index is 24.13 kg/(m^2).  GENERAL APPEARANCE: No acute distress, appropriately groomed, normal body habitus. Alert, pleasant, conversant. SKIN: No diaphoresis, rash, unusual lesions, wounds HEAD: Normocephalic, atraumatic EYES: Conjunctiva/lids clear. Pupils round, reactive.  EARS:  Hearing grossly normal. NOSE: No deformity or discharge. MOUTH/THROAT: Lips w/o lesions. Oral mucosa, tongue moist, w/o lesion. Oropharynx w/o redness or lesions.  NECK: Supple, full ROM. No thyroid tenderness, enlargement or  nodule LYMPHATICS: No head, neck or supraclavicular adenopathy RESPIRATORY: Breathing is even, unlabored. Lung sounds are clear and full.  CARDIOVASCULAR: Heart RRR. No murmur or extra heart sounds MUSCULOSKELETAL:  Gait is steady NEUROLOGIC: Oriented to time, place, person. Speech clear, no tremor.  PSYCHIATRIC: Mood and affect appropriate to situation  ASSESSMENT/PLAN  Cough Cough initially dry now mildly productive, consistent with postnasal drainage. Recommend antihistamine twice a day dosing for the next few days then daily dosing until symptoms resolve.  Recommend increasing fluids both cool and warm liquids. Patient has been instructed to call if cough changes or worsens or if she develops a fever    Follow up: As scheduled with Dr. Annamary Carolin T.Alesha Jaffee, NP-C 07/02/2013

## 2013-07-02 NOTE — Assessment & Plan Note (Signed)
Cough initially dry now mildly productive, consistent with postnasal drainage. Recommend antihistamine twice a day dosing for the next few days then daily dosing until symptoms resolve.  Recommend increasing fluids both cool and warm liquids. Patient has been instructed to call if cough changes or worsens or if she develops a fever

## 2013-08-04 ENCOUNTER — Encounter: Payer: Self-pay | Admitting: Internal Medicine

## 2013-08-04 ENCOUNTER — Non-Acute Institutional Stay: Payer: Medicare Other | Admitting: Internal Medicine

## 2013-08-04 VITALS — BP 132/74 | HR 63 | Resp 12 | Ht 65.0 in | Wt 148.0 lb

## 2013-08-04 DIAGNOSIS — R05 Cough: Secondary | ICD-10-CM

## 2013-08-04 DIAGNOSIS — I4891 Unspecified atrial fibrillation: Secondary | ICD-10-CM

## 2013-08-04 DIAGNOSIS — I10 Essential (primary) hypertension: Secondary | ICD-10-CM

## 2013-08-04 MED ORDER — HYDROCOD POLST-CHLORPHEN POLST 10-8 MG/5ML PO LQCR: ORAL | Status: DC

## 2013-08-04 NOTE — Patient Instructions (Signed)
Call to schedule CXR if cough persists more than 3 more weeks.

## 2013-08-04 NOTE — Progress Notes (Signed)
Patient ID: Rebekah Graham, female   DOB: 15-Dec-1928, 79 y.o.   MRN: [id number]    Location:  [facility] [employer] [facility] (12)    Allergies  Allergen Reactions  . Erythromycin Nausea And Vomiting  . Flecainide Acetate     Postural hypotension  . Penicillins   . Simvastatin     Pains in arms and legs    Chief Complaint  Patient presents with  . URI    Cough x 1 month or longer, runny nose, and sneezing     HPI:  Cough for a month. No fever or sputum. Denies reflux or heartburn. Has rhinorrhea. Using several OTC meds, but no help except from Nyquil. Not able to sing well in the choir.  Medications: Patient's Medications  New Prescriptions   No medications on file  Previous Medications   BIFIDOBACTERIUM INFANTIS (ALIGN) CAPSULE    1 capsule.   CALCIUM CARBONATE (CALCIUM 600) 1500 MG TABS    Take 1 tablet by mouth. 1 by mouth every other day   CHOLECALCIFEROL (VITAMIN D) 1000 UNITS TABLET    Take 1,000 Units by mouth daily.     CYANOCOBALAMIN (GNP VITAMIN B-12 TR) 1000 MCG TBCR    1 tablet.   DABIGATRAN (PRADAXA) 150 MG CAPS    Take 150 mg by mouth every 12 (twelve) hours. Take one twice daily as anticoagulant   DILTIAZEM (DILACOR XR) 120 MG 24 HR CAPSULE    Take 120 mg by mouth daily.   FAMOTIDINE-CALCIUM CARBONATE-MAGNESIUM HYDROXIDE (PEPCID COMPLETE) 10-800-165 MG CHEW    Chew 1 tablet by mouth daily as needed.   LORATADINE (CLARITIN) 10 MG TABLET    10 mg.   LOSARTAN (COZAAR) 50 MG TABLET    Take 50 mg by mouth daily.   MAGNESIUM SALICYLATE 325 MG TABS    Take 1 tablet by mouth daily.     METOPROLOL (LOPRESSOR) 50 MG TABLET    50 mg.   METOPROLOL SUCCINATE (TOPROL-XL) 50 MG 24 HR TABLET    Take one tablet three times daily to control blood pressure and heart rhythm Take with or immediately following a meal.   MISC NATURAL PRODUCTS (TURMERIC CURCUMIN) CAPS    Take 1 capsule by mouth daily.     MULTIPLE VITAMIN (MULTIVITAMIN) TABLET    Take 1  tablet by mouth daily.     SIMVASTATIN (ZOCOR) 40 MG TABLET    Take 40 mg by mouth at bedtime.     TURMERIC (RA TURMERIC) 500 MG CAPS    1 capsule.  Modified Medications   No medications on file  Discontinued Medications   LOSARTAN (COZAAR) 100 MG TABLET    Take 100 mg by mouth daily. Take one tablet daily for blood pressure   VITAMIN B-12 (CYANOCOBALAMIN) 100 MCG TABLET    Take 50 mcg by mouth daily.       Review of Systems  Constitutional: Negative.  Negative for fever and unexpected weight change.  HENT: Negative.   Eyes:       Bilateral blepharochalasia. She is healing from recent lower lid surgery.  Respiratory: Positive for cough (dry). Negative for choking, chest tightness, shortness of breath, wheezing and stridor.   Cardiovascular: Negative for chest pain and palpitations.       History of AF. Currently in NSR.  Gastrointestinal: Positive for constipation.  Endocrine: Negative.   Genitourinary: Negative.        Urine leakage.  Musculoskeletal: Negative.   Skin:  History of rosacea.  Neurological:       Has noted some changes in memory  Hematological: Negative.   Psychiatric/Behavioral: Negative.     Filed Vitals:   08/04/13 1523  BP: 132/74  Pulse: 63  Resp: 12  Height: 5\' 5"  (1.651 m)  Weight: 148 lb (67.132 kg)  SpO2: 94%   Physical Exam  Constitutional: She is oriented to person, place, and time. She appears well-developed and well-nourished.  HENT:  Head: Normocephalic and atraumatic.  Right Ear: External ear normal.  Left Ear: External ear normal.  Nose: Nose normal.  Eyes:  Corrective lenses.  Neck: Neck supple. No JVD present. No tracheal deviation present. No thyromegaly present.  Cardiovascular: Normal rate, regular rhythm, normal heart sounds and intact distal pulses.  Exam reveals no gallop and no friction rub.   No murmur heard. Pulmonary/Chest: Effort normal and breath sounds normal. No respiratory distress. She has no wheezes. She has  no rales.  Abdominal: Bowel sounds are normal. She exhibits no distension and no mass. There is no tenderness.  Musculoskeletal: Normal range of motion. She exhibits no edema and no tenderness.  Lymphadenopathy:    She has no cervical adenopathy.  Neurological: She is alert and oriented to person, place, and time. She has normal reflexes. No cranial nerve deficit. Coordination normal.  Skin: No rash noted. No erythema. No pallor.  Cheeks are slightly red bilaterally  Psychiatric: She has a normal mood and affect. Her behavior is normal. Judgment and thought content normal.   Assessment/Plan Cough - Plan: chlorpheniramine-HYDROcodone (TUSSIONEX PENNKINETIC ER) 10-8 MG/5ML LQCR. If cough persists more than 3 more weeks, do CXR.  HTN (hypertension): controlled  Atrial fibrillation: rate controlled

## 2013-08-25 ENCOUNTER — Encounter: Payer: Self-pay | Admitting: Internal Medicine

## 2013-08-25 ENCOUNTER — Non-Acute Institutional Stay: Payer: Medicare Other | Admitting: Internal Medicine

## 2013-08-25 VITALS — BP 118/76 | HR 72 | Temp 97.6°F | Ht 65.0 in | Wt 146.0 lb

## 2013-08-25 DIAGNOSIS — R05 Cough: Secondary | ICD-10-CM

## 2013-08-25 DIAGNOSIS — R059 Cough, unspecified: Secondary | ICD-10-CM

## 2013-08-25 MED ORDER — BUDESONIDE 180 MCG/ACT IN AEPB: INHALATION_SPRAY | RESPIRATORY_TRACT | Status: DC

## 2013-08-25 MED ORDER — RANITIDINE HCL 150 MG PO TABS: ORAL_TABLET | ORAL | Status: DC

## 2013-08-25 MED ORDER — HYDROCOD POLST-CHLORPHEN POLST 10-8 MG/5ML PO LQCR: ORAL | Status: DC

## 2013-08-25 NOTE — Progress Notes (Signed)
Patient ID: Rebekah Graham, female   DOB: 03/10/1929, 80 y.o.   MRN: [id number]    Location:  [facility] (12)    Allergies  Allergen Reactions  . Erythromycin Nausea And Vomiting  . Flecainide Acetate     Postural hypotension  . Penicillins   . Simvastatin     Pains in arms and legs    Chief Complaint  Patient presents with  . Cough    off and on. Gets better, then gets worse. No SOB. Tussionex helps when she takes it.     HPI:  Cough: Started in Nov 2014. Dry. Sometimes has heartburn. May be worse when when she first lays down. No fever. It is not worsened at meals. Has mild dysphagia with swallowing pills.    Medications: Patient's Medications  New Prescriptions   No medications on file  Previous Medications   BIFIDOBACTERIUM INFANTIS (ALIGN) CAPSULE    1 capsule.   CALCIUM CARBONATE (CALCIUM 600) 1500 MG TABS    Take 1 tablet by mouth. 1 by mouth every other day   CHLORPHENIRAMINE-HYDROCODONE (TUSSIONEX PENNKINETIC ER) 10-8 MG/5ML [facility]    1/2/ to 1 tsp every 12 hours as needed to suppress cough   CHOLECALCIFEROL (VITAMIN D) 1000 UNITS TABLET    Take 1,000 Units by mouth daily.     CYANOCOBALAMIN (GNP VITAMIN B-12 TR) 1000 MCG TBCR    1 tablet.   DABIGATRAN (PRADAXA) 150 MG CAPS    Take 150 mg by mouth every 12 (twelve) hours. Take one twice daily as anticoagulant   DILTIAZEM (DILACOR XR) 120 MG 24 HR CAPSULE    Take 120 mg by mouth daily.   FAMOTIDINE-CALCIUM CARBONATE-MAGNESIUM HYDROXIDE (PEPCID COMPLETE) 10-800-165 MG CHEW    Chew 1 tablet by mouth daily as needed.   LORATADINE (CLARITIN) 10 MG TABLET    10 mg.   LOSARTAN (COZAAR) 50 MG TABLET    Take 50 mg by mouth daily.   MAGNESIUM SALICYLATE 657 MG TABS    Take 1 tablet by mouth daily.     METOPROLOL (LOPRESSOR) 50 MG TABLET    50 mg.   METOPROLOL SUCCINATE (TOPROL-XL) 50 MG 24 HR TABLET    Take one tablet three times daily to control blood pressure and heart  rhythm Take with or immediately following a meal.   MISC NATURAL PRODUCTS (TURMERIC CURCUMIN) CAPS    Take 1 capsule by mouth daily.     MULTIPLE VITAMIN (MULTIVITAMIN) TABLET    Take 1 tablet by mouth daily.     SIMVASTATIN (ZOCOR) 40 MG TABLET    Take 40 mg by mouth at bedtime.     TURMERIC (RA TURMERIC) 500 MG CAPS    1 capsule.  Modified Medications   No medications on file  Discontinued Medications   No medications on file     Review of Systems  Constitutional: Negative.  Negative for fever and unexpected weight change.  HENT: Negative.   Eyes:       Bilateral blepharochalasia. She is healing from recent lower lid surgery.  Respiratory: Positive for cough (dry). Negative for choking, chest tightness, shortness of breath, wheezing and stridor.   Cardiovascular: Negative for chest pain and palpitations.       History of AF. Currently in NSR.  Gastrointestinal: Positive for constipation.  Endocrine: Negative.   Genitourinary: Negative.        Urine leakage.  Musculoskeletal: Negative.   Skin:  History of rosacea.  Neurological:       Has noted some changes in memory  Hematological: Negative.   Psychiatric/Behavioral: Negative.     Filed Vitals:   08/25/13 1416  BP: 118/76  Pulse: 72  Temp: 97.6 F (36.4 C)  TempSrc: Oral  Height: 5\' 5"  (1.651 m)  Weight: 146 lb (66.225 kg)   Physical Exam  Constitutional: She is oriented to person, place, and time. She appears well-developed and well-nourished.  HENT:  Head: Normocephalic and atraumatic.  Right Ear: External ear normal.  Left Ear: External ear normal.  Nose: Nose normal.  Eyes:  Corrective lenses.  Neck: Neck supple. No JVD present. No tracheal deviation present. No thyromegaly present.  Cardiovascular: Normal rate, regular rhythm, normal heart sounds and intact distal pulses.  Exam reveals no gallop and no friction rub.   No murmur heard. Pulmonary/Chest: Effort normal and breath sounds normal. No  respiratory distress. She has no wheezes. She has no rales.  Abdominal: Bowel sounds are normal. She exhibits no distension and no mass. There is no tenderness.  Musculoskeletal: Normal range of motion. She exhibits no edema and no tenderness.  Lymphadenopathy:    She has no cervical adenopathy.  Neurological: She is alert and oriented to person, place, and time. She has normal reflexes. No cranial nerve deficit. Coordination normal.  Skin: No rash noted. No erythema. No pallor.  Cheeks are slightly red bilaterally  Psychiatric: She has a normal mood and affect. Her behavior is normal. Judgment and thought content normal.     Labs reviewed: No visits with results within 3 Month(s) from this visit. Latest known visit with results is:  Abstract on 03/21/2013  Component Date Value Range Status  . HM Mammogram 10/13/2011 negative   Final  . HM Dexa Scan 10/29/2009 slight decrease in bone density    Final      Assessment/Plan 1. Cough ? Etiology. Suspect reflux. If the medications prescribed today are not helpfu;l, she should see pulmonologist for further advice - budesonide (PULMICORT FLEXHALER) 180 MCG/ACT inhaler; Inhale one puff twice daily  Dispense: 1 Inhaler; Refill: 0 - ranitidine (ZANTAC) 150 MG tablet; One twice daily to reduce stomach acid acid to help reduce cough  Dispense: 60 tablet; Refill: 0 - chlorpheniramine-HYDROcodone (TUSSIONEX PENNKINETIC ER) 10-8 MG/5ML LQCR; 1/2/ to 1 tsp every 12 hours as needed to suppress cough  Dispense: 120 mL; Refill: 0

## 2013-08-25 NOTE — Patient Instructions (Signed)
Use medications as directed. If cough does not improve, we will refer you to a pulmonary specialist for further evaluation.

## 2013-09-10 ENCOUNTER — Non-Acute Institutional Stay: Payer: Medicare Other | Admitting: Geriatric Medicine

## 2013-09-10 VITALS — BP 120/72 | HR 60 | Temp 97.5°F | Ht 65.0 in | Wt 143.0 lb

## 2013-09-10 DIAGNOSIS — R197 Diarrhea, unspecified: Secondary | ICD-10-CM

## 2013-09-10 NOTE — Progress Notes (Signed)
Patient ID: Rebekah Graham, female   DOB: 14-Apr-1929, 80 y.o.   MRN: [id number]  [place] [phone])  Code Status: FullCode, [place]   Name Relation Home Work [place] Other [phone]        Chief Complaint  Patient presents with  . Diarrhea    since 09/06/12 Saturday, took Pepto-Bismol for 2 days, then Imodium for 2 days. Goes 8-9 times a day, real waterly. No fever, no vomiting.    HPI: This is a 80 y.o. female resident of [place]  section.  Evaluation is requested today due to GI complaints. Patient was in her usual state of health until Friday or Saturday, January 24. She suddenly had diarrhea, multiple stools very loose sometimes nearly incontinent. She initially took Pepto-Bismol for 2 days then Imodium for 2 days. Neither of these medicines decreased number of stools; stool remains very loose sometimes watery. Is now very dark. The patient denies any fever, abdominal pain, vomiting, nausea. She's not traveled outside of [place] since December when she went to [place], [place]. Prior to that she had she was on a cruise to the [place] in October. She eats most meals at [employer], did have lunch at the [facility] last week. Spouse also at the same meal, he has not had the same symptoms.   Allergies  Allergen Reactions  . Erythromycin Nausea And Vomiting  . Flecainide Acetate     Postural hypotension  . Penicillins   . Simvastatin     Pains in arms and legs       Medication List       This list is accurate as of: 09/10/13 11:40 AM.  Always use your most recent med list.               bifidobacterium infantis capsule  1 capsule.     budesonide 180 MCG/ACT inhaler  Commonly known as:  PULMICORT FLEXHALER  Inhale one puff twice daily     CALCIUM 600 1500 MG Tabs  Generic drug:  Calcium Carbonate  Take 1 tablet by mouth. 1 by mouth every  other day     chlorpheniramine-HYDROcodone 10-8 MG/5ML Lqcr  Commonly known as:  TUSSIONEX PENNKINETIC ER  1/2/ to 1 tsp every 12 hours as needed to suppress cough     cholecalciferol 1000 UNITS tablet  Commonly known as:  VITAMIN D  Take 1,000 Units by mouth daily.     dabigatran 150 MG Caps capsule  Commonly known as:  PRADAXA  Take 150 mg by mouth every 12 (twelve) hours. Take one twice daily as anticoagulant     diltiazem 120 MG 24 hr capsule  Commonly known as:  DILACOR XR  Take 120 mg by mouth daily.     GNP VITAMIN B-12 TR 1000 MCG Tbcr  Generic drug:  Cyanocobalamin  1 tablet.     loratadine 10 MG tablet  Commonly known as:  CLARITIN  10 mg.     losartan 50 MG tablet  Commonly known as:  COZAAR  Take 50 mg by mouth daily.     Magnesium Salicylate 322 MG Tabs  Take 1 tablet by mouth daily.     metoprolol 50 MG tablet  Commonly known as:  LOPRESSOR  50 mg.     metoprolol succinate 50 MG 24 hr tablet  Commonly known as:  TOPROL-XL  Take one tablet three times daily to control  blood pressure and heart rhythm Take with or immediately following a meal.     multivitamin tablet  Take 1 tablet by mouth daily.     PEPCID COMPLETE 10-800-165 MG Chew chewable tablet  Generic drug:  famotidine-calcium carbonate-magnesium hydroxide  Chew 1 tablet by mouth daily as needed.     RA TURMERIC 500 MG Caps  Generic drug:  Turmeric  1 capsule.     ranitidine 150 MG tablet  Commonly known as:  ZANTAC  One twice daily to reduce stomach acid acid to help reduce cough     simvastatin 40 MG tablet  Commonly known as:  ZOCOR  Take 40 mg by mouth at bedtime.     Turmeric Curcumin Caps  Take 1 capsule by mouth daily.       DATA REVIEWED  Radiologic Exams:   Cardiovascular Exams:   Laboratory Studies:      REVIEW OF SYSTEMS DATA OBTAINED: from patient,  GENERAL: Feels well, a little tired the   No fevers, fatigue. Appetite decreased due to diarrhea SKIN: No itch,  rash or open wounds EYES: No eye pain, dryness or itching  No change in vision EARS: No earache, tinnitus, change in hearing NOSE: No congestion drainage or bleeding. No bleeding MOUTH/THROAT: No mouth or tooth pain  No sore throat   No difficulty chewing or swallowing RESPIRATORY: No cough , wheezing or shortness of breath CARDIAC: No chest pain, palpitations  No edema. GI: SEE HPI GU: No dysuria, frequency urgency. No change in urine output PSYCHIATRIC: No feelings of anxiety, depression Sleeps well.  No behavior issue.    PHYSICAL EXAM Filed Vitals:   09/10/13 1438  BP: 120/72  Pulse: 60  Temp: 97.5 F (36.4 C)  TempSrc: Oral  Height: 5\' 5"  (1.651 m)  Weight: 143 lb (64.864 kg)   Body mass index is 23.8 kg/(m^2).  GENERAL APPEARANCE: No acute distress, appropriately groomed, normal body habitus. Alert, pleasant, conversant. SKIN: No diaphoresis, rash, unusual lesions, wounds HEAD: Normocephalic, atraumatic EYES: Conjunctiva/lids clear. Pupils round, reactive.  EARS:  Hearing grossly normal. NOSE: No deformity or discharge. MOUTH/THROAT: Lips w/o lesions. Oral mucosa, tongue moist, w/o lesion. Oropharynx w/o redness or lesions.  RESPIRATORY: Breathing is even, unlabored. Lung sounds are clear and full.  CARDIOVASCULAR: Heart RRR. No murmur or extra heart sounds GASTROINTESTINAL: Abdomen is soft, non-tender, not distended w/ normal bowel sounds. No hepatic or splenic enlargement. No mass, ventral or inguinal hernia.  RECTAL: No anal fissure, skin tag or external hemorrhoid. Sphincter tone WNL. No rectal mass. Stool is brown, heme negative. MUSCULOSKELETAL:  Gait is steady NEUROLOGIC: Oriented to time, place, person. Speech clear, no tremor.  PSYCHIATRIC: Mood and affect appropriate to situation  ASSESSMENT/PLAN  Diarrhea Acute onset of diarrhea 4 days ago no other significant GI symptoms, no fever. No travel other than cruise to the [place] in October 2014. Send stool  studies for culture, ova and parasites, C. difficile. Advised patient to continue to hydrate well, limit diet to bland soft foods, may use Imodium for the next couple of days.    Follow up: As scheduled with Dr. Elvera Lennox T.Ola Raap, NP-C 09/10/2013

## 2013-09-11 DIAGNOSIS — R197 Diarrhea, unspecified: Secondary | ICD-10-CM | POA: Insufficient documentation

## 2013-09-11 NOTE — Assessment & Plan Note (Signed)
Acute onset of diarrhea 4 days ago no other significant GI symptoms, no fever. No travel other than cruise to the [place] in October 2014. Send stool studies for culture, ova and parasites, C. difficile. Advised patient to continue to hydrate well, limit diet to bland soft foods, may use Imodium for the next couple of days.

## 2013-09-17 ENCOUNTER — Telehealth: Payer: Self-pay

## 2013-09-17 NOTE — Telephone Encounter (Signed)
Test results on stool done 09/10/13 were negative, spoke with patient. Will keep appointment with Dr. Nyoka Cowden 09/29/13

## 2013-09-19 ENCOUNTER — Other Ambulatory Visit: Payer: Self-pay | Admitting: Internal Medicine

## 2013-09-23 LAB — HEPATIC FUNCTION PANEL
ALT: 11 U/L (ref 7–35)
AST: 19 U/L (ref 13–35)
Alkaline Phosphatase: 48 U/L (ref 25–125)
BILIRUBIN, TOTAL: 0.4 mg/dL

## 2013-09-23 LAB — LIPID PANEL
Cholesterol: 120 mg/dL (ref 0–200)
HDL: 26 mg/dL — AB (ref 35–70)
LDL Cholesterol: 82 mg/dL
TRIGLYCERIDES: 98 mg/dL (ref 40–160)

## 2013-09-23 LAB — BASIC METABOLIC PANEL
BUN: 12 mg/dL (ref 4–21)
CREATININE: 0.7 mg/dL (ref 0.5–1.1)
Glucose: 87 mg/dL
Potassium: 4.1 mmol/L (ref 3.4–5.3)
SODIUM: 137 mmol/L (ref 137–147)

## 2013-09-29 ENCOUNTER — Encounter: Payer: Self-pay | Admitting: Internal Medicine

## 2013-09-29 ENCOUNTER — Non-Acute Institutional Stay: Payer: Medicare Other | Admitting: Internal Medicine

## 2013-09-29 VITALS — BP 130/70 | HR 64 | Ht 64.0 in | Wt 141.0 lb

## 2013-09-29 DIAGNOSIS — R059 Cough, unspecified: Secondary | ICD-10-CM

## 2013-09-29 DIAGNOSIS — I10 Essential (primary) hypertension: Secondary | ICD-10-CM

## 2013-09-29 DIAGNOSIS — G4700 Insomnia, unspecified: Secondary | ICD-10-CM

## 2013-09-29 DIAGNOSIS — I4891 Unspecified atrial fibrillation: Secondary | ICD-10-CM

## 2013-09-29 DIAGNOSIS — R05 Cough: Secondary | ICD-10-CM

## 2013-09-29 DIAGNOSIS — E785 Hyperlipidemia, unspecified: Secondary | ICD-10-CM

## 2013-09-29 DIAGNOSIS — H6120 Impacted cerumen, unspecified ear: Secondary | ICD-10-CM

## 2013-09-29 MED ORDER — DABIGATRAN ETEXILATE MESYLATE 150 MG PO CAPS: ORAL_CAPSULE | ORAL | Status: DC

## 2013-09-29 NOTE — Progress Notes (Signed)
Patient ID: Rebekah Graham, female   DOB: January 31, 1929, 80 y.o.   MRN: [id number]    Nursing Home Location:  [place] of Service: Clinic (12)  PCP: Estill Dooms, MD  Code Status: LIVING WILL  Allergies  Allergen Reactions  . Erythromycin Nausea And Vomiting  . Flecainide Acetate     Postural hypotension  . Penicillins   . Simvastatin     Pains in arms and legs    Chief Complaint  Patient presents with  . Medical Managment of Chronic Issues    Comprehensive exam: blood prssure, cholesterol, insomnia, A-Fib    HPI:  Atrial fibrillation - anticoagulated with dabigatran (PRADAXA) 150 MG CAPS capsule  HTN (hypertension): controlled  Hyperlipidemia: controlled  Insomnia, unspecified: feels like she is getting enough sleep now  Cough: persistent and dry. Intermittent.  Impacted cerumen: right ear      Past Medical History  Diagnosis Date  . Atrial tachycardia     s/p ablation 2008. [facility] (R free wall Atach)  . Atrial fibrillation   . HTN (hypertension)   . Hyperlipidemia   . Personality change due to conditions classified elsewhere   . Restless legs syndrome (RLS)   . Unspecified hereditary and idiopathic peripheral neuropathy   . Left bundle branch hemiblock   . Thumb pain 11/20/2012  . Rectocele 09/23/2012  . Atrophy of vulva 09/23/2012  . Rosacea 09/23/2012  . Actinic keratosis 09/23/2012  . Other malaise and fatigue 03/05/2012  . Loss of weight 03/05/2012  . Dysphagia, unspecified(787.20) 01/17/2012  . Blepharochalasis 03/01/2011  . Sebaceous cyst 03/01/2011  . Arthralgia of temporomandibular joint 12/30/2009  . Cardiac dysrhythmia, unspecified 11/10/2009  . Myalgia and myositis, unspecified 01/05/2009  . External hemorrhoids without mention of complication [id number]  . 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 Dermatophytosis of nail 02/28/2008  . Internal hemorrhoids without mention of complication [id number]  . Pain in joint, site  unspecified 02/28/2008  . Dizziness and giddiness 04/03/2007  . Orthostatic hypotension 03/22/2007  . Edema 11/21/2006  . Palpitations 11/21/2006  . Insomnia, unspecified 11/18/2004    Past Surgical History  Procedure Laterality Date  . Catheter ablation  2008    for atrial tachycardia  . Total abdominal hysterectomy  2001    uterine bleeding  . Cyst excision  1998    mucus cyst removal from finger  . Bladder suspension  1998  . Cataract extraction w/ intraocular lens implant Right 4/8//2008    Dr. Charise Killian    CONSULTANTS Cardio: Einar Gip Ophth: Epes  PAST PROCEDURES 1987-X-ray of cervical spine 1989-Left knee xray 1999-CXR 2001-Ultrasound of pelvic 2001-EKG 2001-Stress Test 2002-Pap Smear 2004-Bone Density 2004-Mammogram 2007-Mammogram 2007-Nerve Conduction Study 2007-Peripheral Nerve Conduction Study of legs: normal 09-18-2006 Mammogram negative 2008 Renal US: no charge. Slight right renal artery stenosis.2008 ETT 12-20-2006 Stress test-low risk scan 12-20-2006 2D Echo EF >55%  02-26-2007 Radiofrequency Ablation Procedure Dr. Delight Ovens [facility] 03-21-2007 Renal doppler Right renal artery elevated velocities suggesting greater than 60% diameter  reduction however FMD cannot be excluded. Left normal patency 05/09/07 Abd [place]: Small hepatic and left renal cysts. Cannot exclude left renal solid mass; Suggest CT abd 05-15-2007 CT Abdomen/pelvic no renal mass. 08/28/07 MRI abdomen: Tiny hepatic cysts, otherwise negative study. 10/01/2008 Mammogram Normal  10/05/2009 Mammogram Normal  10/29/2009 Bone Density Very Slight decrease in bone density  07/26/2010 Chest X-Ray No active lund disease. Slight hyperaeration Mild cardiomegaly  10/10/2010 Mammogram Normal    10/13/2011 Mammogram negative  Social History: History   Social History  . Marital Status: Married    Spouse Name: N/A    Number of Children: N/A  . Years of Education: N/A   Occupational History  . Retired Pharmacist, hospital     Social History Main Topics  . Smoking status: Never Smoker   . Smokeless tobacco: Never Used     Comment: tobacco  - none  . Alcohol Use: Yes     Comment: 1-2 glasses of wine/gin daily   . Drug Use: No  . Sexual Activity: None   Other Topics Concern  . 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 Mother Deceased 79    alcoholism and CHF related to this  . Father Deceased 19  . Sister Deceased   . Sister Alive   . Daughter Alive   . Son Alive   . Son Alive   . Son Deceased     skiing accident  . Sister Alive    Family History  Problem Relation Age of Onset  . Heart disease Mother   . Heart disease Father   . Cancer Sister     breast     Medications: Patient's Medications  New Prescriptions   No medications on file  Previous Medications   BIFIDOBACTERIUM INFANTIS (ALIGN) CAPSULE    1 capsule.   BUDESONIDE (PULMICORT FLEXHALER) 180 MCG/ACT INHALER    Inhale one puff twice daily   CALCIUM CARBONATE (CALCIUM 600) 1500 MG TABS    Take 1 tablet by mouth. 1 by mouth every other day   CHLORPHENIRAMINE-HYDROCODONE (TUSSIONEX PENNKINETIC ER) 10-8 MG/5ML [facility]    1/2/ to 1 tsp every 12 hours as needed to suppress cough   CHOLECALCIFEROL (VITAMIN D) 1000 UNITS TABLET    Take 1,000 Units by mouth daily.     DABIGATRAN (PRADAXA) 150 MG CAPS    Take 150 mg by mouth every 12 (twelve) hours. Take one twice daily as anticoagulant   DILTIAZEM (DILACOR XR) 120 MG 24 HR CAPSULE    Take 120 mg by mouth daily.   FAMOTIDINE-CALCIUM CARBONATE-MAGNESIUM HYDROXIDE (PEPCID COMPLETE) 10-800-165 MG CHEW    Chew 1 tablet by mouth daily as needed.   LORATADINE (CLARITIN) 10 MG TABLET    10 mg.   LOSARTAN (COZAAR) 50 MG TABLET    Take 50 mg by mouth daily.   MAGNESIUM SALICYLATE 536 MG TABS    Take 1 tablet by mouth daily.      METOPROLOL SUCCINATE (TOPROL-XL) 50 MG 24 HR TABLET    Take one tablet three times daily to control blood pressure and heart rhythm Take with or immediately following a meal.   MISC NATURAL PRODUCTS (TURMERIC CURCUMIN) CAPS    Take 1 capsule by mouth daily.     MULTIPLE VITAMIN (MULTIVITAMIN) TABLET    Take 1 tablet by mouth daily.     RANITIDINE (ZANTAC) 150 MG TABLET    take 1 tablet by mouth twice a day to REDUCE STOMACH ACID to HELP REDUCE cough   SIMVASTATIN (ZOCOR) 40 MG TABLET    Take 40 mg by mouth at bedtime.     TURMERIC (RA TURMERIC) 500 MG CAPS    1 capsule.  Modified Medications   No medications on file  Discontinued Medications   CYANOCOBALAMIN (  GNP VITAMIN B-12 TR) 1000 MCG TBCR    1 tablet.   METOPROLOL (LOPRESSOR) 50 MG TABLET    50 mg.    Immunization History  Administered Date(s) Administered  . Influenza Whole 06/12/2012  . Influenza,inj,Quad PF,36+ Mos 05/13/2013  . Pneumococcal Polysaccharide-23 08/14/1997  . Td 04/15/2011  . Zoster 08/15/2007     Review of Systems  Constitutional: Negative.  Negative for fever and unexpected weight change.  HENT: Positive for hearing loss.   Eyes:       Bilateral blepharochalasia. She is healing from recent lower lid surgery.  Respiratory: Positive for cough (dry). Negative for choking, chest tightness, shortness of breath, wheezing and stridor.   Cardiovascular: Negative for chest pain and palpitations.       History of AF. Currently in NSR.  Gastrointestinal: Positive for constipation.  Endocrine: Negative.   Genitourinary: Negative.        Urine leakage.  Musculoskeletal: Negative.   Skin:       History of rosacea.  Allergic/Immunologic: Negative.   Neurological:       Has noted some changes in memory  Hematological: Negative.   Psychiatric/Behavioral: Negative.       Filed Vitals:   09/29/13 1516  BP: 130/70  Pulse: 64  Height: 5\' 4"  (1.626 m)  Weight: 141 lb (63.957 kg)   Physical Exam    Constitutional: She is oriented to person, place, and time. She appears well-developed and well-nourished. No distress.  HENT:  Head: Normocephalic and atraumatic.  Nose: Nose normal.  Mouth/Throat: Oropharyngeal exudate present.  Wax impaction in right ext canal.  Eyes:  Corrective lenses  Neck: No JVD present. No tracheal deviation present. No thyromegaly present.  Respiratory: No respiratory distress. She has no wheezes. She has no rales. She exhibits no tenderness.  GI: She exhibits no distension and no mass. There is no tenderness. There is no rebound and no guarding.  Genitourinary: Guaiac negative stool. No vaginal discharge found.  Vulvar and vaginal atrophy and small rectocele  Musculoskeletal: She exhibits no edema and no tenderness.  Lymphadenopathy:    She has no cervical adenopathy.  Neurological: She is alert and oriented to person, place, and time. She has normal reflexes. No cranial nerve deficit. Coordination normal.  Skin: No rash noted. No erythema. No pallor.  Psychiatric: She has a normal mood and affect. Her behavior is normal. Judgment and thought content normal.       Labs reviewed: Abstract on 09/26/2013  Component Date Value Ref Range Status  . Glucose 09/23/2013 87   Final  . BUN 09/23/2013 12  4 - 21 mg/dL Final  . Creatinine 09/23/2013 0.7  0.5 - 1.1 mg/dL Final  . Potassium 09/23/2013 4.1  3.4 - 5.3 mmol/L Final  . Sodium 09/23/2013 137  137 - 147 mmol/L Final  . Triglycerides 09/23/2013 98  40 - 160 mg/dL Final  . Cholesterol 09/23/2013 120  0 - 200 mg/dL Final  . HDL 09/23/2013 26* 35 - 70 mg/dL Final  . LDL Cholesterol 09/23/2013 82   Final  . Alkaline Phosphatase 09/23/2013 48  25 - 125 U/L Final  . ALT 09/23/2013 11  7 - 35 U/L Final  . AST 09/23/2013 19  13 - 35 U/L Final  . Bilirubin, Total 09/23/2013 0.4   Final      Assessment/Plan 1. Atrial fibrillation Seems to be in NSR today - dabigatran (PRADAXA) 150 MG CAPS capsule; Take  one twice daily as anticoagulant  Dispense: 180  capsule; Refill: 3  2. HTN (hypertension) controlled  3. Hyperlipidemia controlled  4. Insomnia, unspecified improved  5. Cough Mild. Persistent.  6. Impacted cerumen Removed with forceps and lavage. Small amount of blood in the RIGHT EAC following procedure.

## 2013-10-01 ENCOUNTER — Telehealth: Payer: Self-pay

## 2013-10-01 NOTE — Telephone Encounter (Signed)
Dr. Nyoka Cowden did ear lavage on right ear Monday. That night her ear bleed got on her pillow. Had Mliss Sax [facility] clinic nurse)  look at it, there's some dry blood in the ear. Spoke with Dr. Nyoka Cowden, he was sorry about that, when he removed the wax it probably caused an abrasion. When she takes a shower let the water run in her ear, it will help get the dry blood out. As long as she's not having any pain, we could see her next Monday 23th. Called Mrs Horsfall back, she is not having any pain. Told her to call back if any further problems.

## 2013-10-17 ENCOUNTER — Encounter: Payer: Self-pay | Admitting: Internal Medicine

## 2014-01-02 ENCOUNTER — Telehealth: Payer: Self-pay

## 2014-01-02 NOTE — Telephone Encounter (Signed)
Prior Authorization was initiated for Pradaxa 150 mg cap- 1 by mouth twice a day as anticoagulant. Medication was approved trough 01/03/2015 Case ID # [id number]  I contacted [facility] to inform them rx was approved

## 2014-01-07 ENCOUNTER — Other Ambulatory Visit: Payer: Self-pay | Admitting: *Deleted

## 2014-01-07 MED ORDER — SIMVASTATIN 40 MG PO TABS: ORAL_TABLET | ORAL | Status: DC

## 2014-01-07 NOTE — Telephone Encounter (Signed)
[facility] 

## 2014-03-24 LAB — BASIC METABOLIC PANEL
BUN: 18 mg/dL (ref 4–21)
Creatinine: 0.8 mg/dL (ref 0.5–1.1)
GLUCOSE: 91 mg/dL
Potassium: 4 mmol/L (ref 3.4–5.3)
Sodium: 138 mmol/L (ref 137–147)

## 2014-03-24 LAB — LIPID PANEL
Cholesterol: 152 mg/dL (ref 0–200)
HDL: 47 mg/dL (ref 35–70)
LDL CALC: 108 mg/dL
LDl/HDL Ratio: 2.3
Triglycerides: 91 mg/dL (ref 40–160)

## 2014-03-24 LAB — HEPATIC FUNCTION PANEL
ALT: 16 U/L (ref 7–35)
AST: 25 U/L (ref 13–35)
Alkaline Phosphatase: 67 U/L (ref 25–125)
Bilirubin, Total: 0.6 mg/dL

## 2014-03-30 ENCOUNTER — Non-Acute Institutional Stay: Payer: Medicare Other | Admitting: Internal Medicine

## 2014-03-30 ENCOUNTER — Encounter: Payer: Self-pay | Admitting: Internal Medicine

## 2014-03-30 VITALS — BP 108/60 | HR 56 | Wt 146.0 lb

## 2014-03-30 DIAGNOSIS — H2512 Age-related nuclear cataract, left eye: Secondary | ICD-10-CM | POA: Insufficient documentation

## 2014-03-30 DIAGNOSIS — I10 Essential (primary) hypertension: Secondary | ICD-10-CM

## 2014-03-30 DIAGNOSIS — Z7901 Long term (current) use of anticoagulants: Secondary | ICD-10-CM | POA: Insufficient documentation

## 2014-03-30 DIAGNOSIS — I4891 Unspecified atrial fibrillation: Secondary | ICD-10-CM

## 2014-03-30 DIAGNOSIS — E785 Hyperlipidemia, unspecified: Secondary | ICD-10-CM

## 2014-03-30 DIAGNOSIS — I482 Chronic atrial fibrillation, unspecified: Secondary | ICD-10-CM

## 2014-03-30 DIAGNOSIS — G4700 Insomnia, unspecified: Secondary | ICD-10-CM

## 2014-03-30 NOTE — Progress Notes (Signed)
Patient ID: Rebekah Graham, female   DOB: 05-05-1929, 80 y.o.   MRN: [id number]    Location:  [facility] (12)    Allergies  Allergen Reactions  . Erythromycin Nausea And Vomiting  . Flecainide Acetate     Postural hypotension  . Penicillins   . Simvastatin     Pains in arms and legs    Chief Complaint  Patient presents with  . Medical Management of Chronic Issues    blood pressure, A-Fib, cholesterol    HPI:  Essential hypertension: controlled  Chronic atrial fibrillation: rate controlled  Hyperlipidemia:controlled  Long term (current) use of anticoagulants: on Pradaxa for AF  Insomnia, unspecified: doing well    Medications: Patient's Medications  New Prescriptions   No medications on file  Previous Medications   BIFIDOBACTERIUM INFANTIS (ALIGN) CAPSULE    1 capsule.   BUDESONIDE (PULMICORT FLEXHALER) 180 MCG/ACT INHALER    Inhale one puff twice daily   CALCIUM CARBONATE (CALCIUM 600) 1500 MG TABS    Take 1 tablet by mouth. 1 by mouth every other day   CHLORPHENIRAMINE-HYDROCODONE (TUSSIONEX PENNKINETIC ER) 10-8 MG/5ML [facility]    1/2/ to 1 tsp every 12 hours as needed to suppress cough   CHOLECALCIFEROL (VITAMIN D) 1000 UNITS TABLET    Take 1,000 Units by mouth daily.     DABIGATRAN (PRADAXA) 150 MG CAPS CAPSULE    Take one twice daily as anticoagulant   DILTIAZEM (DILACOR XR) 120 MG 24 HR CAPSULE    Take 120 mg by mouth daily.   FAMOTIDINE-CALCIUM CARBONATE-MAGNESIUM HYDROXIDE (PEPCID COMPLETE) 10-800-165 MG CHEW    Chew 1 tablet by mouth daily as needed.   LORATADINE (CLARITIN) 10 MG TABLET    10 mg.   LOSARTAN (COZAAR) 50 MG TABLET    Take 50 mg by mouth daily.   METOPROLOL SUCCINATE (TOPROL-XL) 50 MG 24 HR TABLET    Take one tablet three times daily to control blood pressure and heart rhythm Take with or immediately following a meal.   MISC NATURAL PRODUCTS (TURMERIC CURCUMIN) CAPS    Take 1 capsule by mouth daily.      MULTIPLE VITAMIN (MULTIVITAMIN) TABLET    Take 1 tablet by mouth daily.     SIMVASTATIN (ZOCOR) 40 MG TABLET    Take one tablet by mouth once daily to lower cholesterol  Modified Medications   No medications on file  Discontinued Medications   MAGNESIUM SALICYLATE 106 MG TABS    Take 1 tablet by mouth daily.     RANITIDINE (ZANTAC) 150 MG TABLET    take 1 tablet by mouth twice a day to REDUCE STOMACH ACID to HELP REDUCE cough   TURMERIC (RA TURMERIC) 500 MG CAPS    1 capsule.     Review of Systems  Constitutional: Negative.  Negative for fever and unexpected weight change.  HENT: Positive for hearing loss.   Eyes: Positive for visual disturbance (corrective lenses).  Respiratory: Positive for cough (dry). Negative for choking, chest tightness, shortness of breath, wheezing and stridor.   Cardiovascular: Negative for chest pain and palpitations.       History of AF. Currently in NSR.  Gastrointestinal: Positive for constipation.  Endocrine: Negative.   Genitourinary: Negative.        Urine leakage.  Musculoskeletal: Negative.   Skin:       History of rosacea.  Allergic/Immunologic: Negative.   Neurological:       Has noted  some changes in memory  Hematological: Negative.   Psychiatric/Behavioral: Negative.     Filed Vitals:   03/30/14 1437  BP: 108/60  Pulse: 56  Weight: 146 lb (66.225 kg)   Body mass index is 25.05 kg/(m^2).  Physical Exam  Constitutional: She is oriented to person, place, and time. She appears well-developed and well-nourished.  HENT:  Head: Normocephalic and atraumatic.  Right Ear: External ear normal.  Left Ear: External ear normal.  Nose: Nose normal.  Eyes:  Corrective lenses.  Neck: Neck supple. No JVD present. No tracheal deviation present. No thyromegaly present.  Cardiovascular: Normal rate, regular rhythm, normal heart sounds and intact distal pulses.  Exam reveals no gallop and no friction rub.   No murmur heard. Pulmonary/Chest:  Effort normal and breath sounds normal. No respiratory distress. She has no wheezes. She has no rales.  Abdominal: Bowel sounds are normal. She exhibits no distension and no mass. There is no tenderness.  Musculoskeletal: Normal range of motion. She exhibits no edema and no tenderness.  Lymphadenopathy:    She has no cervical adenopathy.  Neurological: She is alert and oriented to person, place, and time. She has normal reflexes. No cranial nerve deficit. Coordination normal.  Skin: No rash noted. No erythema. No pallor.  Cheeks are slightly red bilaterally  Psychiatric: She has a normal mood and affect. Her behavior is normal. Judgment and thought content normal.     Labs reviewed: Nursing Home on 03/30/2014  Component Date Value Ref Range Status  . Glucose 03/24/2014 91   Final  . BUN 03/24/2014 18  4 - 21 mg/dL Final  . Creatinine 03/24/2014 0.8  0.5 - 1.1 mg/dL Final  . Potassium 03/24/2014 4.0  3.4 - 5.3 mmol/L Final  . Sodium 03/24/2014 138  137 - 147 mmol/L Final  . LDl/HDL Ratio 03/24/2014 2.3   Final  . Triglycerides 03/24/2014 91  40 - 160 mg/dL Final  . Cholesterol 03/24/2014 152  0 - 200 mg/dL Final  . HDL 03/24/2014 47  35 - 70 mg/dL Final  . LDL Cholesterol 03/24/2014 108   Final  . Alkaline Phosphatase 03/24/2014 67  25 - 125 U/L Final  . ALT 03/24/2014 16  7 - 35 U/L Final  . AST 03/24/2014 25  13 - 35 U/L Final  . Bilirubin, Total 03/24/2014 0.6   Final      Assessment/Plan  1. Essential hypertension controlled  2. Chronic atrial fibrillation Rate controlled  3. Hyperlipidemia controlled  4. Long term (current) use of anticoagulants For AF  5. Insomnia, unspecified controlled

## 2014-04-03 ENCOUNTER — Other Ambulatory Visit: Payer: Self-pay | Admitting: Internal Medicine

## 2014-09-29 LAB — LIPID PANEL
Cholesterol: 150 mg/dL (ref 0–200)
HDL: 47 mg/dL (ref 35–70)
LDL Cholesterol: 98 mg/dL
LDl/HDL Ratio: 2.1
Triglycerides: 93 mg/dL (ref 40–160)

## 2014-09-29 LAB — HEPATIC FUNCTION PANEL
ALT: 12 U/L (ref 7–35)
AST: 20 U/L (ref 13–35)
Alkaline Phosphatase: 59 U/L (ref 25–125)
BILIRUBIN, TOTAL: 0.5 mg/dL

## 2014-09-29 LAB — BASIC METABOLIC PANEL
BUN: 14 mg/dL (ref 4–21)
Creatinine: 0.8 mg/dL (ref 0.5–1.1)
Glucose: 88 mg/dL
Potassium: 4.2 mmol/L (ref 3.4–5.3)
SODIUM: 139 mmol/L (ref 137–147)

## 2014-10-02 ENCOUNTER — Encounter: Payer: Self-pay | Admitting: *Deleted

## 2014-10-05 ENCOUNTER — Encounter: Payer: Self-pay | Admitting: Internal Medicine

## 2014-10-06 ENCOUNTER — Non-Acute Institutional Stay: Payer: Medicare Other | Admitting: Internal Medicine

## 2014-10-06 ENCOUNTER — Encounter: Payer: Self-pay | Admitting: Internal Medicine

## 2014-10-06 VITALS — BP 122/70 | HR 68 | Temp 97.7°F | Ht 64.0 in | Wt 150.0 lb

## 2014-10-06 DIAGNOSIS — Z418 Encounter for other procedures for purposes other than remedying health state: Secondary | ICD-10-CM | POA: Diagnosis not present

## 2014-10-06 DIAGNOSIS — I482 Chronic atrial fibrillation, unspecified: Secondary | ICD-10-CM

## 2014-10-06 DIAGNOSIS — I10 Essential (primary) hypertension: Secondary | ICD-10-CM

## 2014-10-06 DIAGNOSIS — H6123 Impacted cerumen, bilateral: Secondary | ICD-10-CM | POA: Diagnosis not present

## 2014-10-06 DIAGNOSIS — Z7901 Long term (current) use of anticoagulants: Secondary | ICD-10-CM | POA: Diagnosis not present

## 2014-10-06 DIAGNOSIS — I872 Venous insufficiency (chronic) (peripheral): Secondary | ICD-10-CM | POA: Diagnosis not present

## 2014-10-06 DIAGNOSIS — Z299 Encounter for prophylactic measures, unspecified: Secondary | ICD-10-CM

## 2014-10-06 DIAGNOSIS — E785 Hyperlipidemia, unspecified: Secondary | ICD-10-CM | POA: Diagnosis not present

## 2014-10-06 DIAGNOSIS — R32 Unspecified urinary incontinence: Secondary | ICD-10-CM

## 2014-10-06 NOTE — Progress Notes (Signed)
Patient ID: Rebekah Graham, female   DOB: 02/18/1929, 81 y.o.   MRN: [id number]   Location:  [facility]  Code Status: DNR  Allergies  Allergen Reactions  . Erythromycin Nausea And Vomiting  . Flecainide Acetate     Postural hypotension  . Penicillins   . Simvastatin     Pains in arms and legs    Chief Complaint  Patient presents with  . Annual Exam    Comprehensive exam :  blood pressure, A-Fib, cholesterol    HPI: Patient is a 81 y.o. white female seen in the office today for an annual exam.  No new complaints today.   Has a rash on her neck. Has seen the dermatologist, Dr. Pablo Ledger, for this, uses triamcinolone with relief.   Started in a women's health study to research if taking cocoa extract reduces risk of cardiovascular incidents.COSMOS study.    A-fib is controlled, she is on Pradaxa, followed by Dr. Einar Gip. Notes she feels her pulse at night when she lies down at night and thinks it might be going faster then vs. In the daytime. Sees Dr. Einar Gip again in ~April. No palpitations, chest pain, dyspnea,   Occasional edema in ankles for a few days, but is better now. Asked about the brown discoloration on her anterior shins.  MMSE 30/30. No deficits in memory or concentration. Passed clock drawing.  Mood is doing well, gets discouraged sometimes when her husband isn't doing as good, but overall feels good.   Notes urinary leaking. Also complains of difficulty starting to void when she needs to have a bowel movement. Has history of rectocele.   Review of Systems:  Review of Systems  Constitutional: Negative for malaise/fatigue.  HENT: Negative for hearing loss.   Eyes: Negative for blurred vision and double vision.       Wears glasses, followed by ophthalmologist.  Respiratory: Negative for cough, hemoptysis and shortness of breath.   Cardiovascular: Negative for chest pain, palpitations and orthopnea.  Gastrointestinal: Negative for heartburn, nausea,  vomiting, abdominal pain, diarrhea, constipation and blood in stool.  Genitourinary: Negative for dysuria, urgency, frequency and hematuria.       Urinary leaking  Musculoskeletal: Negative for myalgias, joint pain and falls.  Skin: Positive for rash.  Neurological: Positive for tingling. Negative for dizziness, sensory change, weakness and headaches.       In toes, has had for years   Endo/Heme/Allergies: Bruises/bleeds easily.       On pradaxa  Psychiatric/Behavioral: Negative for depression.    Past Medical History  Diagnosis Date  . Atrial tachycardia     s/p ablation 2008. [facility] (R free wall Atach)  . Atrial fibrillation   . HTN (hypertension)   . Hyperlipidemia   . Personality change due to conditions classified elsewhere   . Restless legs syndrome (RLS)   . Unspecified hereditary and idiopathic peripheral neuropathy   . Left bundle branch hemiblock   . Thumb pain 11/20/2012  . Rectocele 09/23/2012  . Atrophy of vulva 09/23/2012  . Rosacea 09/23/2012  . Actinic keratosis 09/23/2012  . Other malaise and fatigue 03/05/2012  . Loss of weight 03/05/2012  . Dysphagia, unspecified(787.20) 01/17/2012  . Blepharochalasis 03/01/2011  . Sebaceous cyst 03/01/2011  . Arthralgia of temporomandibular joint 12/30/2009  . Cardiac dysrhythmia, unspecified 11/10/2009  . Myalgia and myositis, unspecified 01/05/2009  . External hemorrhoids without mention of complication 57/32/2025  . 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 Dermatophytosis of nail 02/28/2008  . Internal hemorrhoids  without mention of complication 04/17/91  . Pain in joint, site unspecified 02/28/2008  . Dizziness and giddiness 04/03/2007  . Orthostatic hypotension 03/22/2007  . Edema 11/21/2006  . Palpitations 11/21/2006  . Insomnia, unspecified 11/18/2004    Past Surgical History  Procedure Laterality Date  . Catheter ablation  2008    for atrial tachycardia  . Total abdominal hysterectomy  2001    uterine bleeding  . Cyst excision   1998    mucus cyst removal from finger  . Bladder suspension  1998  . Cataract extraction w/ intraocular lens implant Right 4/8//2008    Dr. Charise Killian    Social History:   reports that she has never smoked. She has never used smokeless tobacco. She reports that she drinks alcohol. She reports that she does not use illicit drugs.  Family History  Problem Relation Age of Onset  . Heart disease Mother   . Heart disease Father   . Cancer Sister     breast    Medications: Patient's Medications  New Prescriptions   No medications on file  Previous Medications   BIFIDOBACTERIUM INFANTIS (ALIGN) CAPSULE    1 capsule.   BUDESONIDE (PULMICORT FLEXHALER) 180 MCG/ACT INHALER    Inhale one puff twice daily   CHLORPHENIRAMINE-HYDROCODONE (TUSSIONEX PENNKINETIC ER) 10-8 MG/5ML [facility]    1/2/ to 1 tsp every 12 hours as needed to suppress cough   CHOLECALCIFEROL (VITAMIN D) 1000 UNITS TABLET    Take 1,000 Units by mouth daily.     DABIGATRAN (PRADAXA) 150 MG CAPS CAPSULE    Take one twice daily as anticoagulant   DILTIAZEM (DILACOR XR) 120 MG 24 HR CAPSULE    Take 120 mg by mouth daily.   LOSARTAN (COZAAR) 50 MG TABLET    Take 50 mg by mouth daily.   METOPROLOL SUCCINATE (TOPROL-XL) 50 MG 24 HR TABLET    take 1 tablet by mouth three times a day to CONTROL HEART RHYTHM   MISC NATURAL PRODUCTS (TURMERIC CURCUMIN) CAPS    Take 1 capsule by mouth daily.     SIMVASTATIN (ZOCOR) 40 MG TABLET    Take one tablet by mouth once daily to lower cholesterol   TRIAMCINOLONE CREAM (KENALOG) 0.1 %    Apply twice daily for 2 weeks  Modified Medications   No medications on file  Discontinued Medications   CALCIUM CARBONATE (CALCIUM 600) 1500 MG TABS    Take 1 tablet by mouth. 1 by mouth every other day   FAMOTIDINE-CALCIUM CARBONATE-MAGNESIUM HYDROXIDE (PEPCID COMPLETE) 10-800-165 MG CHEW    Chew 1 tablet by mouth daily as needed.   LORATADINE (CLARITIN) 10 MG TABLET    10 mg.   MULTIPLE VITAMIN (MULTIVITAMIN) TABLET     Take 1 tablet by mouth daily.       Physical Exam: Filed Vitals:   10/06/14 1450  BP: 122/70  Pulse: 68  Temp: 97.7 F (36.5 C)  TempSrc: Oral  Height: 5\' 4"  (1.626 m)  Weight: 150 lb (68.04 kg)  SpO2: 99%   Physical Exam  Constitutional: She is oriented to person, place, and time. She appears well-developed and well-nourished.  HENT:  Head: Normocephalic.  Mouth/Throat: Oropharynx is clear and moist.  R and left cerumen impactions  Eyes: EOM are normal. Pupils are equal, round, and reactive to light.  Neck: Normal range of motion. Neck supple. No JVD present. No thyromegaly present.  Cardiovascular: Normal heart sounds and intact distal pulses.   Irregular rhythm, a-fib, bilateral ankle  edema L>R  Pulmonary/Chest: Effort normal and breath sounds normal. No respiratory distress. She has no wheezes. She has no rales.  Abdominal: Soft. Bowel sounds are normal. She exhibits no distension. There is no tenderness.  Musculoskeletal: Normal range of motion. She exhibits tenderness.  Bilateral swelling in thumb and finger joints, tenderness in bilateral lower extremeties at calfs  Neurological: She is alert and oriented to person, place, and time. She has normal reflexes. She displays normal reflexes. No cranial nerve deficit. She exhibits normal muscle tone. Coordination normal.  Skin: Skin is warm and dry. Rash noted. There is erythema.  mildy erythematous rash on neck and upper back, erythema on bilateral cheeks, discolored areas on bilateral lower extremities, bilateral foot redness  Psychiatric: She has a normal mood and affect. Her behavior is normal. Judgment and thought content normal.  Vitals reviewed.   Labs reviewed: Basic Metabolic Panel:  Recent Labs  03/24/14 09/29/14  NA 138 139  K 4.0 4.2  BUN 18 14  CREATININE 0.8 0.8   Liver Function Tests:  Recent Labs  03/24/14 09/29/14  AST 25 20  ALT 16 12  ALKPHOS 67 59   No results for input(s): LIPASE, AMYLASE  in the last 8760 hours. No results for input(s): AMMONIA in the last 8760 hours. CBC: No results for input(s): WBC, NEUTROABS, HGB, HCT, MCV, PLT in the last 8760 hours. Lipid Panel:  Recent Labs  03/24/14 09/29/14  CHOL 152 150  HDL 47 47  LDLCALC 108 98  TRIG 91 93   Assessment/Plan 1. Chronic atrial fibrillation - follows with Dr. Einar Gip for this -continues on diltiazem 120mg  daily, toprol xl 50mg  daily and pradaxa w/o any concerns for bleeding -advised to feel her pulse at her wrist when she is thinking it is increased when she is at rest--suspect she is just noticing her afib when she is relaxed b/c she is very active all day long  2. Essential hypertension -bp controlled with diltiazem, metoprolol, losartan and w/o side effects, f/u bmp before next routine  3. Long term current use of anticoagulant therapy -cont pradaxa, f/u cbc before next routine  4. Hyperlipidemia -lipids at goal with zocor 40mg  -recheck in 1 year  5. Chronic venous insufficiency -explained that the color changes in her legs are due to this, encouraged her to elevate her feet the little time she spends sitting down and relaxing  6. Urinary incontinence, unspecified incontinence type -encouraged kegel exercises, has rectocele that does not help  7. Discussion of goals of care - DNR (Do Not Resuscitate) form was completed  8. Impacted cerumen, bilateral -recommended debrox drops nightly for a week--if cerumen is not coming out, then she may need to come in to have them flushed   Labs/tests ordered:  Cbc, cmp, flp before Next appt:  6 mo f/u  [facility] L. Brittin Janik, D.O. [facility] [address], [place] [address] Cell Phone (Mon-Fri 8am-5pm):  [phone] On Call:  [phone] & follow prompts after 5pm & weekends Office Phone:  [phone] Office Fax:  [phone]

## 2014-10-06 NOTE — Progress Notes (Signed)
Passed clock drawing 

## 2014-10-07 ENCOUNTER — Encounter: Payer: Self-pay | Admitting: Internal Medicine

## 2014-10-16 ENCOUNTER — Encounter: Payer: Self-pay | Admitting: Internal Medicine

## 2015-01-01 ENCOUNTER — Other Ambulatory Visit: Payer: Self-pay | Admitting: *Deleted

## 2015-01-01 MED ORDER — SIMVASTATIN 40 MG PO TABS: ORAL_TABLET | ORAL | Status: DC

## 2015-01-01 NOTE — Telephone Encounter (Signed)
[facility] 

## 2015-01-04 DIAGNOSIS — H353190 Nonexudative age-related macular degeneration, unspecified eye, stage unspecified: Secondary | ICD-10-CM | POA: Insufficient documentation

## 2015-03-30 LAB — BASIC METABOLIC PANEL
BUN: 15 mg/dL (ref 4–21)
Creatinine: 0.8 mg/dL (ref 0.5–1.1)
Glucose: 91 mg/dL
Potassium: 4.1 mmol/L (ref 3.4–5.3)
SODIUM: 138 mmol/L (ref 137–147)

## 2015-03-30 LAB — CBC AND DIFFERENTIAL
HEMATOCRIT: 42 % (ref 36–46)
HEMOGLOBIN: 14 g/dL (ref 12.0–16.0)
Platelets: 225 10*3/uL (ref 150–399)
WBC: 4.8 10^3/mL

## 2015-03-30 LAB — HEPATIC FUNCTION PANEL
ALT: 10 U/L (ref 7–35)
AST: 20 U/L (ref 13–35)
Alkaline Phosphatase: 63 U/L (ref 25–125)
BILIRUBIN, TOTAL: 0.6 mg/dL

## 2015-04-01 ENCOUNTER — Other Ambulatory Visit: Payer: Self-pay

## 2015-04-06 ENCOUNTER — Encounter: Payer: Self-pay | Admitting: Internal Medicine

## 2015-04-07 ENCOUNTER — Encounter: Payer: Self-pay | Admitting: Internal Medicine

## 2015-04-07 ENCOUNTER — Non-Acute Institutional Stay: Payer: Medicare Other | Admitting: Internal Medicine

## 2015-04-07 VITALS — BP 102/60 | HR 50 | Temp 98.2°F | Wt 152.0 lb

## 2015-04-07 DIAGNOSIS — Z7901 Long term (current) use of anticoagulants: Secondary | ICD-10-CM

## 2015-04-07 DIAGNOSIS — S8991XA Unspecified injury of right lower leg, initial encounter: Secondary | ICD-10-CM

## 2015-04-07 DIAGNOSIS — I10 Essential (primary) hypertension: Secondary | ICD-10-CM | POA: Diagnosis not present

## 2015-04-07 DIAGNOSIS — I872 Venous insufficiency (chronic) (peripheral): Secondary | ICD-10-CM | POA: Diagnosis not present

## 2015-04-07 DIAGNOSIS — M25562 Pain in left knee: Secondary | ICD-10-CM | POA: Diagnosis not present

## 2015-04-07 DIAGNOSIS — I482 Chronic atrial fibrillation, unspecified: Secondary | ICD-10-CM

## 2015-04-07 DIAGNOSIS — Z9181 History of falling: Secondary | ICD-10-CM

## 2015-04-07 NOTE — Progress Notes (Signed)
Location:  [facility] Code Status: DNR Goals of Care: Advanced Directive information Does patient have an advance directive?: Yes, Type of Advance Directive: [place];Out of facility DNR (pink MOST or yellow form), Pre-existing out of facility DNR order (yellow form or pink MOST form): Yellow form placed in chart (order not valid for inpatient use)   Chief Complaint  Patient presents with  . Medical Management of Chronic Issues    A-Fib, blood pressure  . Wound Check    right lower leg has wound since July   . Fall    in July on left knee, still hurts  . Edema    in legs off and on  . Verrucous Vulgaris    on right ring finger    HPI: Patient is a 81 y.o. female seen in the office today for 6th month followup.   She has been doing well int he interim.   Has a small wound on her R leg, from tripping while adjusting some chairs, that she doesn't think is healing well. Injured approximately 6 weeks ago. Doesn't look worse than six weeks ago, but hasn't completley healed. Her pedicurist thought it might look infected. She also injured her left knee, if she kneels on something hard it continues to hurt, but otherwise it has healed.   She has a wart on her R ring finger. She hasn't tried any interventions for it but is wondering if she should.   Has some edema in bilateral ankles, worse in the evenings. Resolved in the mornings.  Has a supplement called [employer]- "helps memory focus mental clarity and mood". It has green tea extract which she noted in a consumer reports article stated to avoid. She is wondering if she should not take it.   Cognitive Function assessed using 6CIT test- score 0. She remains physically active and at a high functioning level. Lives independent and is independent with all ADL's. She is traveling to [place] for her granddaughter's wedding. PHQ Depression scale noted total score of 0.   Noted she has some bradycardia  today, however, this may be inaccurate as she has an irregular rhythm upon ausculation. Denies lightheadedness, fatigue, palpitations. Has an appointment with her cardiologist in a few months.   Labs reviewed with her as all within normal range.   She is eating and sleeping well. Trying to avoid too many desserts.   Review of Systems:  Review of Systems  Constitutional: Negative for fever, chills, weight loss and malaise/fatigue.  Eyes: Negative.  Negative for blurred vision and double vision.  Respiratory: Negative for cough, hemoptysis, shortness of breath and wheezing.   Cardiovascular: Positive for leg swelling. Negative for chest pain, palpitations and orthopnea.  Gastrointestinal: Negative for nausea, vomiting, abdominal pain, diarrhea and constipation.  Musculoskeletal: Positive for falls.  Neurological: Negative for dizziness, tingling, tremors and headaches.  Psychiatric/Behavioral: Negative for depression. The patient is not nervous/anxious.     Past Medical History  Diagnosis Date  . Atrial tachycardia     s/p ablation 2008. [facility] (R free wall Atach)  . Atrial fibrillation   . HTN (hypertension)   . Hyperlipidemia   . Personality change due to conditions classified elsewhere   . Restless legs syndrome (RLS)   . Unspecified hereditary and idiopathic peripheral neuropathy   . Left bundle branch hemiblock   . Thumb pain 11/20/2012  . Rectocele 09/23/2012  . Atrophy of vulva 09/23/2012  . Rosacea 09/23/2012  . Actinic keratosis  09/23/2012  . Other malaise and fatigue 03/05/2012  . Loss of weight 03/05/2012  . Dysphagia, unspecified(787.20) 01/17/2012  . Blepharochalasis 03/01/2011  . Sebaceous cyst 03/01/2011  . Arthralgia of temporomandibular joint 12/30/2009  . Cardiac dysrhythmia, unspecified 11/10/2009  . Myalgia and myositis, unspecified 01/05/2009  . External hemorrhoids without mention of complication 44/31/5400  . 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 Dermatophytosis of  nail 02/28/2008  . Internal hemorrhoids without mention of complication 8/67/6195  . Pain in joint, site unspecified 02/28/2008  . Dizziness and giddiness 04/03/2007  . Orthostatic hypotension 03/22/2007  . Edema 11/21/2006  . Palpitations 11/21/2006  . Insomnia, unspecified 11/18/2004    Past Surgical History  Procedure Laterality Date  . Catheter ablation  2008    for atrial tachycardia  . Total abdominal hysterectomy  2001    uterine bleeding  . Cyst excision  1998    mucus cyst removal from finger  . Bladder suspension  1998  . Cataract extraction w/ intraocular lens implant Right 4/8//2008    Dr. Charise Killian    Allergies  Allergen Reactions  . Erythromycin Nausea And Vomiting  . Flecainide Acetate     Postural hypotension  . Penicillins   . Simvastatin     Pains in arms and legs   Medications: Patient's Medications  New Prescriptions   No medications on file  Previous Medications   BIFIDOBACTERIUM INFANTIS (ALIGN) CAPSULE    1 capsule.   BUDESONIDE (PULMICORT FLEXHALER) 180 MCG/ACT INHALER    Inhale one puff twice daily   CALCIUM-MAGNESIUM-VITAMIN D (CALCIUM MAGNESIUM PO)    Take by mouth. 400 units one daily   DABIGATRAN (PRADAXA) 150 MG CAPS CAPSULE    Take one twice daily as anticoagulant   DILTIAZEM (DILACOR XR) 120 MG 24 HR CAPSULE    Take 120 mg by mouth daily.   LOSARTAN (COZAAR) 50 MG TABLET    Take 50 mg by mouth daily.   METOPROLOL SUCCINATE (TOPROL-XL) 50 MG 24 HR TABLET    take 1 tablet by mouth three times a day to CONTROL HEART RHYTHM   MISC NATURAL PRODUCTS (TURMERIC CURCUMIN) CAPS    Take 1 capsule by mouth daily.     MULTIPLE VITAMIN (MULTIVITAMIN) TABLET    Take 1 tablet by mouth daily.   MULTIPLE VITAMINS-MINERALS (PRESERVISION AREDS PO)    Take by mouth. Take one tablet daily   SIMVASTATIN (ZOCOR) 40 MG TABLET    Take one tablet by mouth once daily to lower cholesterol   TRIAMCINOLONE CREAM (KENALOG) 0.1 %    Apply twice daily for 2 weeks  Modified Medications     No medications on file  Discontinued Medications   CHLORPHENIRAMINE-HYDROCODONE (TUSSIONEX PENNKINETIC ER) 10-8 MG/5ML [facility]    1/2/ to 1 tsp every 12 hours as needed to suppress cough   CHOLECALCIFEROL (VITAMIN D) 1000 UNITS TABLET    Take 1,000 Units by mouth daily.      Physical Exam: Filed Vitals:   04/07/15 1437  BP: 102/60  Pulse: 50  Temp: 98.2 F (36.8 C)  TempSrc: Oral  Weight: 152 lb (68.947 kg)  SpO2: 98%   Physical Exam  Constitutional: She is oriented to person, place, and time. She appears well-developed and well-nourished. No distress.  HENT:  Head: Normocephalic and atraumatic.  Eyes: Pupils are equal, round, and reactive to light.  Cardiovascular: Normal heart sounds.  Exam reveals no gallop and no friction rub.   No murmur heard. Irregular rhythm  Musculoskeletal: Normal  range of motion. She exhibits edema.  Trace bilateral pedal edema  Neurological: She is alert and oriented to person, place, and time.  Skin: Skin is warm and dry.  Erythema and hyperpigmentation on anterior bilateral lower legs from above ankle to below the knee Small <27mm healing laceration on R lateral lower leg. Round, pitted, eschar noted in middle of wound, appears to be healing.  Psychiatric: She has a normal mood and affect. Her behavior is normal. Judgment and thought content normal.    Labs reviewed: Basic Metabolic Panel:  Recent Labs  09/29/14 03/30/15  NA 139 138  K 4.2 4.1  BUN 14 15  CREATININE 0.8 0.8   Liver Function Tests:  Recent Labs  09/29/14 03/30/15  AST 20 20  ALT 12 10  ALKPHOS 59 63   No results for input(s): LIPASE, AMYLASE in the last 8760 hours. No results for input(s): AMMONIA in the last 8760 hours. CBC:  Recent Labs  03/30/15  WBC 4.8  HGB 14.0  HCT 42  PLT 225   Lipid Panel:  Recent Labs  09/29/14  CHOL 150  HDL 47  LDLCALC 98  TRIG 93   No results found for: HGBA1C  Procedures since last visit:  Assessment/Plan 1. Right  leg injury, initial encounter- from fall approx 6 weeks ago. It is small and appears to be healing well. Healing may be delayed due to vascular insufficiency. Advised to monitor and call if any signs of infection.  2. Left knee pain- new onset from fall. Not interfering with her gait. Continue to monitor.   3. H/O fall- one time incident related to getting tangled in chairs. Fall risk assessed. No apparent organic cause. Will continue to monitor.  4. Chronic atrial fibrillation- stable. Rate controlled with current medication regimen. Being followed by Dr. Juliet Rude. Recommended that she not take the supplement she was asking about due to the Green Tea effects on heart rate and blood pressure and unknown interactions with her current medications.  5. Long term current use of anticoagulant therapy- continue Pradaxa for chronic A-Fib as prescribed. Monitor CBC and for signs of bleeding.   6. Essential hypertension- well controlled with current medication. Metoprolol reduced from TID to BID due to possible bradycardia.  7. Chronic venous insufficiency- persistent and chronic. Elevate feet when resting to reduce pedal edema.  Labs/tests ordered: none Next appt: Follow up in 6 months with Dr. Mariea Clonts for annual physical.  Mariana Kaufman, RN, BSN, OCN [facility] [address], [place] [address]

## 2015-04-24 ENCOUNTER — Other Ambulatory Visit: Payer: Self-pay | Admitting: Internal Medicine

## 2015-07-14 ENCOUNTER — Emergency Department (HOSPITAL_COMMUNITY)
Admission: EM | Admit: 2015-07-14 | Discharge: 2015-07-15 | Disposition: A | Discharge status: Home / Self Care | Payer: Medicare Other | Attending: Emergency Medicine | Admitting: Emergency Medicine

## 2015-07-14 ENCOUNTER — Encounter (HOSPITAL_COMMUNITY): Payer: Self-pay | Admitting: Emergency Medicine

## 2015-07-14 ENCOUNTER — Emergency Department (HOSPITAL_COMMUNITY): Payer: Medicare Other

## 2015-07-14 DIAGNOSIS — Z8669 Personal history of other diseases of the nervous system and sense organs: Secondary | ICD-10-CM | POA: Insufficient documentation

## 2015-07-14 DIAGNOSIS — Z8742 Personal history of other diseases of the female genital tract: Secondary | ICD-10-CM | POA: Diagnosis not present

## 2015-07-14 DIAGNOSIS — Y9389 Activity, other specified: Secondary | ICD-10-CM | POA: Diagnosis not present

## 2015-07-14 DIAGNOSIS — E785 Hyperlipidemia, unspecified: Secondary | ICD-10-CM | POA: Diagnosis not present

## 2015-07-14 DIAGNOSIS — W010XXA Fall on same level from slipping, tripping and stumbling without subsequent striking against object, initial encounter: Secondary | ICD-10-CM | POA: Insufficient documentation

## 2015-07-14 DIAGNOSIS — S41102A Unspecified open wound of left upper arm, initial encounter: Secondary | ICD-10-CM | POA: Diagnosis not present

## 2015-07-14 DIAGNOSIS — Z872 Personal history of diseases of the skin and subcutaneous tissue: Secondary | ICD-10-CM | POA: Insufficient documentation

## 2015-07-14 DIAGNOSIS — Z8739 Personal history of other diseases of the musculoskeletal system and connective tissue: Secondary | ICD-10-CM | POA: Diagnosis not present

## 2015-07-14 DIAGNOSIS — Z8719 Personal history of other diseases of the digestive system: Secondary | ICD-10-CM | POA: Insufficient documentation

## 2015-07-14 DIAGNOSIS — W19XXXA Unspecified fall, initial encounter: Secondary | ICD-10-CM

## 2015-07-14 DIAGNOSIS — Z23 Encounter for immunization: Secondary | ICD-10-CM | POA: Insufficient documentation

## 2015-07-14 DIAGNOSIS — Z7951 Long term (current) use of inhaled steroids: Secondary | ICD-10-CM | POA: Insufficient documentation

## 2015-07-14 DIAGNOSIS — Y92481 Parking lot as the place of occurrence of the external cause: Secondary | ICD-10-CM | POA: Diagnosis not present

## 2015-07-14 DIAGNOSIS — I4891 Unspecified atrial fibrillation: Secondary | ICD-10-CM | POA: Insufficient documentation

## 2015-07-14 DIAGNOSIS — I10 Essential (primary) hypertension: Secondary | ICD-10-CM | POA: Insufficient documentation

## 2015-07-14 DIAGNOSIS — Y998 Other external cause status: Secondary | ICD-10-CM | POA: Diagnosis not present

## 2015-07-14 DIAGNOSIS — S41112A Laceration without foreign body of left upper arm, initial encounter: Secondary | ICD-10-CM

## 2015-07-14 DIAGNOSIS — S81802A Unspecified open wound, left lower leg, initial encounter: Secondary | ICD-10-CM | POA: Insufficient documentation

## 2015-07-14 DIAGNOSIS — Z880 Allergy status to penicillin: Secondary | ICD-10-CM | POA: Diagnosis not present

## 2015-07-14 DIAGNOSIS — Z79899 Other long term (current) drug therapy: Secondary | ICD-10-CM | POA: Diagnosis not present

## 2015-07-14 DIAGNOSIS — S81812A Laceration without foreign body, left lower leg, initial encounter: Secondary | ICD-10-CM

## 2015-07-14 DIAGNOSIS — S8992XA Unspecified injury of left lower leg, initial encounter: Secondary | ICD-10-CM | POA: Diagnosis present

## 2015-07-14 MED ORDER — HYDROCODONE-ACETAMINOPHEN 5-325 MG PO TABS
2.0000 | ORAL_TABLET | Freq: Once | ORAL | Status: AC
  Administered 2015-07-14: 2 via ORAL
  Filled 2015-07-14: qty 2

## 2015-07-14 MED ORDER — TETANUS-DIPHTH-ACELL PERTUSSIS 5-2.5-18.5 LF-MCG/0.5 IM SUSP
0.5000 mL | Freq: Once | INTRAMUSCULAR | Status: AC
  Administered 2015-07-15: 0.5 mL via INTRAMUSCULAR
  Filled 2015-07-14: qty 0.5

## 2015-07-14 MED ORDER — SODIUM CHLORIDE 0.9 % IV BOLUS (SEPSIS)
500.0000 mL | Freq: Once | INTRAVENOUS | Status: AC
  Administered 2015-07-14: 500 mL via INTRAVENOUS

## 2015-07-14 NOTE — ED Provider Notes (Signed)
Patient seen/examined in the Emergency Department in conjunction with Midlevel Provider  Patient reports falling outside and she sustained large wounds to left LE Exam : large wounds noted with skin tear.  No active bleeding Plan: advised on wound care, will obtain xray imaging and advised antibiotics as chance of wound infection is high    Ripley Fraise, MD 07/14/15 2332

## 2015-07-14 NOTE — ED Notes (Signed)
Bed: [facility] Expected date:  Expected time:  Means of arrival:  Comments: EMS 10F lac to leg fall at choir practice

## 2015-07-14 NOTE — ED Notes (Signed)
Pt BIBA, reports falling outside, on concrete mechanical fall, - LOC, - hitting head. Pt did sustain lac to LLE and RFA. Pt then drove home to [facility] where lac was bandaged. [facility] staff states "you can see the bone." Pt does take Pradaxa.

## 2015-07-14 NOTE — ED Provider Notes (Signed)
CSN: [id number]     Arrival date & time 07/14/15  2135 History   First MD Initiated Contact with Patient 07/14/15 2200     Chief Complaint  Patient presents with  . Laceration   HPI  Ms. Nishimura is an 81 year old female with PMHx of a fib on pradaxa presenting after a fall with a large lower leg wound. Pt was walking across a parking lot when she tripped over a curb and fell on her left leg. She sustained a large shearing injury of the left lateral lower extremity and a smaller wound to the left posterior forearm. She lives in assisted living and they bandaged the wound before transferring her to [facility]. Pt had bled through wound dressing upon arrival. She reports pain at the site of the wound but has no other complaints. She did not hit her head or lose consciousness. She has no other complaints at this time.   Past Medical History  Diagnosis Date  . Atrial tachycardia ([place])     s/p ablation 2008. [facility] (R free wall Atach)  . Atrial fibrillation ([place])   . HTN (hypertension)   . Hyperlipidemia   . Personality change due to conditions classified elsewhere   . Restless legs syndrome (RLS)   . Unspecified hereditary and idiopathic peripheral neuropathy   . Left bundle branch hemiblock   . Thumb pain 11/20/2012  . Rectocele 09/23/2012  . Atrophy of vulva 09/23/2012  . Rosacea 09/23/2012  . Actinic keratosis 09/23/2012  . Other malaise and fatigue 03/05/2012  . Loss of weight 03/05/2012  . Dysphagia, unspecified(787.20) 01/17/2012  . Blepharochalasis 03/01/2011  . Sebaceous cyst 03/01/2011  . Arthralgia of temporomandibular joint 12/30/2009  . Cardiac dysrhythmia, unspecified 11/10/2009  . Myalgia and myositis, unspecified 01/05/2009  . External hemorrhoids without mention of complication 123XX123  . 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 Dermatophytosis of nail 02/28/2008  . Internal hemorrhoids without mention of complication 99991111  . Pain in joint, site unspecified 02/28/2008  . Dizziness and  giddiness 04/03/2007  . Orthostatic hypotension 03/22/2007  . Edema 11/21/2006  . Palpitations 11/21/2006  . Insomnia, unspecified 11/18/2004   Past Surgical History  Procedure Laterality Date  . Catheter ablation  2008    for atrial tachycardia  . Total abdominal hysterectomy  2001    uterine bleeding  . Cyst excision  1998    mucus cyst removal from finger  . Bladder suspension  1998  . Cataract extraction w/ intraocular lens implant Right 4/8//2008    Dr. Charise Killian   Family History  Problem Relation Age of Onset  . Heart disease Mother   . Heart disease Father   . Cancer Sister     breast   Social History  Substance Use Topics  . Smoking status: Never Smoker   . Smokeless tobacco: Never Used     Comment: tobacco  - none  . Alcohol Use: Yes     Comment: 1-2 glasses of wine/gin daily    OB History    No data available     Review of Systems  Skin: Positive for wound.  All other systems reviewed and are negative.     Allergies  Erythromycin; Flecainide acetate; Penicillins; and Simvastatin  Home Medications   Prior to Admission medications   Medication Sig Start Date End Date Taking? Authorizing Provider  cholecalciferol (VITAMIN D) 1000 UNITS tablet Take 1,000 Units by mouth daily.   Yes Historical Provider, MD  dabigatran (PRADAXA) 150 MG CAPS  capsule Take one twice daily as anticoagulant 09/29/13  Yes Estill Dooms, MD  diltiazem (DILACOR XR) 120 MG 24 hr capsule Take 120 mg by mouth daily.   Yes Historical Provider, MD  losartan (COZAAR) 50 MG tablet Take 50 mg by mouth daily.   Yes Historical Provider, MD  metoprolol succinate (TOPROL-XL) 50 MG 24 hr tablet take 1 tablet by mouth three times a day TO CONTROL HEART RHYTHM Patient taking differently: take 1 tablet by mouth two times a day TO CONTROL HEART RHYTHM 04/26/15  Yes Tiffany L Reed, DO  Misc Natural Products (TURMERIC CURCUMIN) CAPS Take 1 capsule by mouth daily.     Yes Historical Provider, MD  Multiple Vitamin  (MULTIVITAMIN) tablet Take 1 tablet by mouth daily.   Yes Historical Provider, MD  Multiple Vitamins-Minerals (PRESERVISION AREDS PO) Take 1 tablet by mouth daily.    Yes Historical Provider, MD  simvastatin (ZOCOR) 40 MG tablet Take one tablet by mouth once daily to lower cholesterol 01/01/15  Yes Tiffany L Reed, DO  budesonide (PULMICORT FLEXHALER) 180 MCG/ACT inhaler Inhale one puff twice daily Patient taking differently: Inhale one puff twice daily, as needed 08/25/13   Estill Dooms, MD  clindamycin (CLEOCIN) 300 MG capsule Take 1 capsule (300 mg total) by mouth 4 (four) times daily. 07/15/15   Aashir Umholtz, PA-C  HYDROcodone-acetaminophen (NORCO/VICODIN) 5-325 MG tablet Take 1-2 tablets by mouth every 6 (six) hours as needed. 07/15/15   Jarick Harkins, PA-C   BP 143/86 mmHg  Pulse 49  Temp(Src) 97.7 F (36.5 C) (Oral)  Resp 13  SpO2 100% Physical Exam  Constitutional: She appears well-developed and well-nourished. No distress.  HENT:  Head: Normocephalic and atraumatic.  Eyes: Conjunctivae are normal. Right eye exhibits no discharge. Left eye exhibits no discharge. No scleral icterus.  Neck: Normal range of motion.  Cardiovascular: Normal rate, regular rhythm and intact distal pulses.   Pedal and radial pulses palpable. Cap refill < 3  Pulmonary/Chest: Effort normal. No respiratory distress.  Musculoskeletal: Normal range of motion.  FROM of knee, ankle and toes intact. Moderate pain over area of wound. FROM of left upper extremity intact.   Neurological: [name] Coordination normal.  5/5 strength of the ankles b/l. 5/5 grip strength b/l. Sensation to light touch intact.   Skin: Skin is warm and dry.  Large skin shearing wound of left lateral lower leg. Wound approximately 14 cm in length and 9 cm at widest point. Large hematoma has formed that prevents good re-approximation of the skin. Partial evacuation of the wound reveals extension of the wound to the tibia. Wound irrigated  well and base visualized in bloodless field. No foreign bodies present. Remaining skin flap covers approximately half the wound.   Smaller skin shearing wound immediately superior to larger leg wound. Wound is approximately 7 cm by 5 cm. Wound irrigated and no foreign bodies visualized. Wound edges approximate well.   Smaller shearing injury with bleeding controlled of left posterior forearm. Wound is approximately 6 cm in length and 2 cm wide. Bleeding controlled without dressing. Skin approximates well. Wound base visualized after irrigation and no foreign bodies present.   Psychiatric: She has a normal mood and affect. Her behavior is normal.  Nursing note and vitals reviewed.   ED Course  Procedures (including critical care time) Labs Review Labs Reviewed - No data to display  Imaging Review Dg Tibia/fibula Left  07/14/2015  CLINICAL DATA:  Tripped and fell tonight. EXAM: LEFT  TIBIA AND FIBULA - 2 VIEW COMPARISON:  None. FINDINGS: There is no evidence of fracture or other focal bone lesions. Soft tissues are disrupted at the antero lateral aspect of the distal lower leg. IMPRESSION: Negative for acute fracture.  No radiopaque foreign body. Electronically Signed   By: Andreas Newport M.D.   On: 07/14/2015 23:03   Dg Ankle Complete Left  07/14/2015  CLINICAL DATA:  Tripped and fell tonight. EXAM: LEFT ANKLE COMPLETE - 3+ VIEW COMPARISON:  None. FINDINGS: Negative for acute fracture, dislocation or radiopaque foreign body. There is disruption of the soft tissues at the anterior lateral aspect of the distal lower leg. IMPRESSION: Negative for fracture or radiopaque foreign body. Electronically Signed   By: Andreas Newport M.D.   On: 07/14/2015 23:04   I have personally reviewed and evaluated these images and lab results as part of my medical decision-making.   EKG Interpretation None      MDM   Final diagnoses:  Fall  Skin tear of lower leg without complication, left, initial  encounter  Skin tear of upper arm without complication, left, initial encounter   81 year old female presenting with shearing injuries after a fall. Left leg and left arm are neurovascularly intact. Xrays are negative for fracture or dislocation. Large shearing wound to the left, lateral lower leg that is not amenable to suture repair. Large hematoma has formed which retracts the skin preventing good approximation. Smaller skin shear noted to the left forearm that is easily approximated and dressed. Dr. Claudine Mouton partially evacuated the hematoma for better skin approximation which showed depth of wound to tibia. Consulted Dr. Marcello Moores with surgery to discuss wound care and possible urgent surgical hematoma evacuation. She recommends wet to dry dressing and close follow up in the wound clinic. Will dress wounds as directed. Pt tetanus updated. Pt is able to ambulate in ED. Will discharge with keflex and pain medication. Instructed to follow up with wound clinic tomorrow, general surgery and follow closely with PCP. Dr. Christy Gentles and Dr. Claudine Mouton have seen and evaluated this patient and agree with the stated plan. Strict return precautions given in discharge paperwork and discussed with pt at bedside. Pt stable for discharge     Josephina Gip, PA-C 07/15/15 0142  Ripley Fraise, MD 07/15/15 1146

## 2015-07-14 NOTE — ED Notes (Signed)
Pt moved to Res A after X-Ray; bleeding controlled at present; family at bedside

## 2015-07-15 DIAGNOSIS — S81802A Unspecified open wound, left lower leg, initial encounter: Secondary | ICD-10-CM | POA: Diagnosis not present

## 2015-07-15 MED ORDER — HYDROCODONE-ACETAMINOPHEN 5-325 MG PO TABS: 1.0000 | ORAL_TABLET | Freq: Four times a day (QID) | ORAL | Status: DC | PRN

## 2015-07-15 MED ORDER — CLINDAMYCIN HCL 300 MG PO CAPS: 300.0000 mg | ORAL_CAPSULE | Freq: Four times a day (QID) | ORAL | Status: DC

## 2015-07-15 NOTE — ED Notes (Signed)
Pt assisted to ambulate to the BR and back; pt ambulatory with assist for steadiness; pt tolerated well; pt states that she feels more pressure to left lower leg when ambulating; upon returning from ambulating blood noted to surface of gauze; no acute bleeding noted to dressing

## 2015-07-15 NOTE — ED Notes (Signed)
Wet to dry dressing to left lower leg placed cover with abd and gauze; pt tolerated well with no acute distress

## 2015-07-15 NOTE — ED Notes (Signed)
Tegaderm placed to left upper shin area skin tear and left posterior forearm skin tear

## 2015-07-15 NOTE — ED Notes (Signed)
Pt began to c/o dizziness, pt HOB decreased, noted to be a fib on monitor with HR between 40 and 49, skin cool pale and diaphoretic. Lahoma Crocker, PA aware.

## 2015-07-15 NOTE — ED Notes (Signed)
Bandage removed to LLE, blood began to spurt from wound. PA aware. CN notified of need for RES room. Pressure bandage applied.

## 2015-07-15 NOTE — Discharge Instructions (Signed)
Schedule a follow up appointment with the [facility] as soon as possible. Return to ED with any signs of infections. Take antibiotics as prescribed   Wound Care Taking care of your wound properly can help to prevent pain and infection. It can also help your wound to heal more quickly.  HOW TO CARE FOR YOUR WOUND  Take or apply over-the-counter and prescription medicines only as told by your health care provider.  If you were prescribed antibiotic medicine, take or apply it as told by your health care provider. Do not stop using the antibiotic even if your condition improves.  Clean the wound each day or as told by your health care provider.  Wash the wound with mild soap and water.  Rinse the wound with water to remove all soap.  Pat the wound dry with a clean towel. Do not rub it.  There are many different ways to close and cover a wound. For example, a wound can be covered with stitches (sutures), skin glue, or adhesive strips. Follow instructions from your health care provider about:  How to take care of your wound.  When and how you should change your bandage (dressing).  When you should remove your dressing.  Removing whatever was used to close your wound.  Check your wound every day for signs of infection. Watch for:  Redness, swelling, or pain.  Fluid, blood, or pus.  Keep the dressing dry until your health care provider says it can be removed. Do not take baths, swim, use a hot tub, or do anything that would put your wound underwater until your health care provider approves.  Raise (elevate) the injured area above the level of your heart while you are sitting or lying down.  Do not scratch or pick at the wound.  Keep all follow-up visits as told by your health care provider. This is important. SEEK MEDICAL CARE IF:  You received a tetanus shot and you have swelling, severe pain, redness, or bleeding at the injection site.  You have a fever.  Your pain  is not controlled with medicine.  You have increased redness, swelling, or pain at the site of your wound.  You have fluid, blood, or pus coming from your wound.  You notice a bad smell coming from your wound or your dressing. SEEK IMMEDIATE MEDICAL CARE IF:  You have a red streak going away from your wound.   This information is not intended to replace advice given to you by your health care provider. Make sure you discuss any questions you have with your health care provider.   Document Released: 05/09/2008 Document Revised: 12/15/2014 Document Reviewed: 07/27/2014 Elsevier Interactive Patient Education 2016 [employer]  Skin Tear Care A skin tear is a wound in which the top layer of skin has peeled off. This is a common problem with aging because the skin becomes thinner and more fragile as a person gets older. In addition, some medicines, such as oral corticosteroids, can lead to skin thinning if taken for long periods of time.  A skin tear is often repaired with tape or skin adhesive strips. This keeps the skin that has been peeled off in contact with the healthier skin beneath. Depending on the location of the wound, a bandage (dressing) may be applied over the tape or skin adhesive strips. Sometimes, during the healing process, the skin turns black and dies. Even when this happens, the torn skin acts as a good dressing until the skin underneath  gets healthier and repairs itself. HOME CARE INSTRUCTIONS   Change dressings once per day or as directed by your caregiver.  Gently clean the skin tear and the area around the tear using saline solution or mild soap and water.  Do not rub the injured skin dry. Let the area air dry.  Apply petroleum jelly or an antibiotic cream or ointment to keep the tear moist. This will help the wound heal. Do not allow a scab to form.  If the dressing sticks before the next dressing change, moisten it with warm soapy water and gently remove  it.  Protect the injured skin until it has healed.  Only take over-the-counter or prescription medicines as directed by your caregiver.  Take showers or baths using warm soapy water. Apply a new dressing after the shower or bath.  Keep all follow-up appointments as directed by your caregiver.  SEEK IMMEDIATE MEDICAL CARE IF:   You have redness, swelling, or increasing pain in the skin tear.  You havepus coming from the skin tear.  You have chills.  You have a red streak that goes away from the skin tear.  You have a bad smell coming from the tear or dressing.  You have a fever or persistent symptoms for more than 2-3 days.  You have a fever and your symptoms suddenly get worse. MAKE SURE YOU:  Understand these instructions.  Will watch this condition.  Will get help right away if your child is not doing well or gets worse.   This information is not intended to replace advice given to you by your health care provider. Make sure you discuss any questions you have with your health care provider.   Document Released: 04/25/2001 Document Revised: 04/24/2012 Document Reviewed: 02/12/2012 Elsevier Interactive Patient Education [employer].

## 2015-07-15 NOTE — ED Notes (Signed)
Pt with 15cm x 9cm skin tear to left lower leg; large hematoma noted to under the skin tear flap; Unable to pull the skin tear flap back over skin due to hematoma; Josephina Gip PA notified of unable to pull skin flap back.

## 2015-07-15 NOTE — ED Notes (Addendum)
Dr Claudine Mouton in room and attempting to evacuate hematoma; several pieces of the blood clot removed by Dr Claudine Mouton but still unable to cover wound with skin tear flap; wound continues to ooze blood; Dr Claudine Mouton advised that he wants to talk to surgery prior to wrapping leg

## 2015-07-15 NOTE — ED Notes (Signed)
Cleansed left lower leg with water and wound cleanser

## 2015-07-19 ENCOUNTER — Non-Acute Institutional Stay: Payer: Medicare Other | Admitting: Adult Health

## 2015-07-19 DIAGNOSIS — T148 Other injury of unspecified body region: Secondary | ICD-10-CM | POA: Diagnosis not present

## 2015-07-19 DIAGNOSIS — T148XXA Other injury of unspecified body region, initial encounter: Principal | ICD-10-CM

## 2015-07-19 DIAGNOSIS — L089 Local infection of the skin and subcutaneous tissue, unspecified: Secondary | ICD-10-CM

## 2015-07-19 LAB — HEPATIC FUNCTION PANEL
ALK PHOS: 60 U/L (ref 25–125)
ALT: 10 U/L (ref 7–35)
AST: 19 U/L (ref 13–35)
Bilirubin, Total: 0.5 mg/dL

## 2015-07-19 LAB — BASIC METABOLIC PANEL
BUN: 14 mg/dL (ref 4–21)
Creatinine: 1 mg/dL (ref 0.5–1.1)
GLUCOSE: 118 mg/dL
POTASSIUM: 3.9 mmol/L (ref 3.4–5.3)
SODIUM: 135 mmol/L — AB (ref 137–147)

## 2015-07-19 LAB — CBC AND DIFFERENTIAL
HEMATOCRIT: 33 % — AB (ref 36–46)
Hemoglobin: 10.9 g/dL — AB (ref 12.0–16.0)
Platelets: 265 10*3/uL (ref 150–399)
WBC: 6.6 10^3/mL

## 2015-07-19 LAB — POCT ERYTHROCYTE SEDIMENTATION RATE, NON-AUTOMATED: Sed Rate: 32 mm

## 2015-07-19 NOTE — Progress Notes (Signed)
Patient ID: Rebekah Graham, female   DOB: [birth year], 81 y.o.   MRN: [id number]     Nursing Home Location:  [place]    Patient Care Team: Gayland Curry, DO as PCP - General (Geriatric Medicine) [place] Jeri Cos, NP as Nurse Practitioner (Geriatric Medicine) Adrian Prows, MD as Consulting Physician (Cardiology) Earlean Polka, MD as Consulting Physician (Ophthalmology)   Place of Service: Clinic (12)  Chief Complaint  Patient presents with  . Acute Visit    f/u wound    HPI:  81 y.o.female  residing at [facility], independent living. I am here to evaluate her wound.  She fell on 11/30 by tripping over a curb.  She developed 3 skin tears.  One on the left upper arm, one on the left upper shin and one on the left lower shin.She was seen in the ER by surgery and sent home with a plan to f/u with surgery and the wound care center.  She has been on clindamycin and using norco for pain. The nurse at [facility] has been changing the dressing with vaseline gauze.  There is serosanguineous drainage from the dressing but no erythema or purulent per the resident and nurse. The resident reports shaking today but no other associated symptoms. She has received a tetanus injection in the ER.   Review of Systems:  Review of Systems  Constitutional: Positive for chills. Negative for fever, diaphoresis, activity change, appetite change and fatigue.  HENT: Negative for congestion.   Respiratory: Negative for cough and shortness of breath.   Cardiovascular: Negative for chest pain, palpitations and leg swelling.  Gastrointestinal: Negative for diarrhea, constipation and abdominal distention.  Genitourinary: Negative for dysuria.  Musculoskeletal: Negative for arthralgias.  Psychiatric/Behavioral: Negative for behavioral problems, confusion and agitation.    Medications: Patient's Medications  New Prescriptions   No  medications on file  Previous Medications   BUDESONIDE (PULMICORT FLEXHALER) 180 MCG/ACT INHALER    Inhale one puff twice daily   CHOLECALCIFEROL (VITAMIN D) 1000 UNITS TABLET    Take 1,000 Units by mouth daily.   CLINDAMYCIN (CLEOCIN) 300 MG CAPSULE    Take 1 capsule (300 mg total) by mouth 4 (four) times daily.   DABIGATRAN (PRADAXA) 150 MG CAPS CAPSULE    Take one twice daily as anticoagulant   DILTIAZEM (DILACOR XR) 120 MG 24 HR CAPSULE    Take 120 mg by mouth daily.   HYDROCODONE-ACETAMINOPHEN (NORCO/VICODIN) 5-325 MG TABLET    Take 1-2 tablets by mouth every 6 (six) hours as needed.   LOSARTAN (COZAAR) 50 MG TABLET    Take 50 mg by mouth daily.   METOPROLOL SUCCINATE (TOPROL-XL) 50 MG 24 HR TABLET    take 1 tablet by mouth three times a day TO CONTROL HEART RHYTHM   MISC NATURAL PRODUCTS (TURMERIC CURCUMIN) CAPS    Take 1 capsule by mouth daily.     MULTIPLE VITAMIN (MULTIVITAMIN) TABLET    Take 1 tablet by mouth daily.   MULTIPLE VITAMINS-MINERALS (PRESERVISION AREDS PO)    Take 1 tablet by mouth daily.    SIMVASTATIN (ZOCOR) 40 MG TABLET    Take one tablet by mouth once daily to lower cholesterol  Modified Medications   No medications on file  Discontinued Medications   No medications on file     Physical Exam:  There were no vitals filed for this visit.  Physical Exam  Constitutional: She is oriented to person, place, and time.  No distress.  HENT:  Head: Normocephalic and atraumatic.  Neurological: She is alert and oriented to person, place, and time.  Skin: Skin is warm and dry. She is not diaphoretic.  Left upper arm with skin tear with no erythema or purulent drainage, bloody drainage noted. Left upper shin with skin tear with bloody drainage, skin flap covering skin tear with purplish discoloration Left lower shin with full thickness skin tear with tendon and bone exposed. Wound bed is pink with no purulent drainage.  Flap will not approximate with the wound. There is  surrounding ecchymoses to the wound and skin flap.  Psychiatric: Affect normal.    Wt Readings from Last 3 Encounters:  04/07/15 152 lb (68.947 kg)  10/06/14 150 lb (68.04 kg)  03/30/14 146 lb (66.225 kg)     Labs reviewed/Significant Diagnostic Results:  Basic Metabolic Panel:  Recent Labs  09/29/14 03/30/15  NA 139 138  K 4.2 4.1  BUN 14 15  CREATININE 0.8 0.8   Liver Function Tests:  Recent Labs  09/29/14 03/30/15  AST 20 20  ALT 12 10  ALKPHOS 59 63   No results for input(s): LIPASE, AMYLASE in the last 8760 hours. No results for input(s): AMMONIA in the last 8760 hours. CBC:  Recent Labs  03/30/15  WBC 4.8  HGB 14.0  HCT 42  PLT 225   CBG: No results for input(s): GLUCAP in the last 8760 hours. TSH: No results for input(s): TSH in the last 8760 hours. A1C: No results found for: HGBA1C Lipid Panel:  Recent Labs  09/29/14  CHOL 150  HDL 47  LDLCALC 98  TRIG 93       Assessment/Plan  1)Skin tear with complication -probable wound infection -move to rehab unit for IV antibiotics -CBC, CMP, ESR -may not have shower -activity as tolerated, nothing rigorous -culture the wound -staff has called and had her apt moved up to 12/6 at 215 pm with the wound care center -she will need close monitoring and if there are signs of toxicity she will need to go to the ER -dressing changes are as follows: mepitel to wound bed of left lower shin with foam dressing on top and wrap in kerlix change daily -tegaderm to left upper arm and left upper shin dressing changed a 5-7 days or if soiled -d/c clindamycin -begin vanc and zosyn per pharmacy -consider picc line in am   Discussed the above findings with Dr. Sunday Corn, [place] [phone]

## 2015-07-20 ENCOUNTER — Non-Acute Institutional Stay (SKILLED_NURSING_FACILITY): Payer: Medicare Other | Admitting: Internal Medicine

## 2015-07-20 ENCOUNTER — Encounter (HOSPITAL_BASED_OUTPATIENT_CLINIC_OR_DEPARTMENT_OTHER): Payer: Medicare Other | Attending: General Surgery

## 2015-07-20 ENCOUNTER — Encounter: Payer: Self-pay | Admitting: Internal Medicine

## 2015-07-20 DIAGNOSIS — I482 Chronic atrial fibrillation, unspecified: Secondary | ICD-10-CM

## 2015-07-20 DIAGNOSIS — I872 Venous insufficiency (chronic) (peripheral): Secondary | ICD-10-CM | POA: Insufficient documentation

## 2015-07-20 DIAGNOSIS — S86222A Laceration of muscle(s) and tendon(s) of anterior muscle group at lower leg level, left leg, initial encounter: Secondary | ICD-10-CM | POA: Diagnosis present

## 2015-07-20 DIAGNOSIS — G629 Polyneuropathy, unspecified: Secondary | ICD-10-CM | POA: Diagnosis not present

## 2015-07-20 DIAGNOSIS — Z7901 Long term (current) use of anticoagulants: Secondary | ICD-10-CM | POA: Diagnosis not present

## 2015-07-20 DIAGNOSIS — L089 Local infection of the skin and subcutaneous tissue, unspecified: Secondary | ICD-10-CM

## 2015-07-20 DIAGNOSIS — Z9181 History of falling: Secondary | ICD-10-CM | POA: Diagnosis not present

## 2015-07-20 DIAGNOSIS — W19XXXA Unspecified fall, initial encounter: Secondary | ICD-10-CM | POA: Insufficient documentation

## 2015-07-20 DIAGNOSIS — T148 Other injury of unspecified body region: Secondary | ICD-10-CM

## 2015-07-20 DIAGNOSIS — T148XXA Other injury of unspecified body region, initial encounter: Principal | ICD-10-CM

## 2015-07-20 DIAGNOSIS — Z79899 Other long term (current) drug therapy: Secondary | ICD-10-CM | POA: Diagnosis not present

## 2015-07-20 DIAGNOSIS — I10 Essential (primary) hypertension: Secondary | ICD-10-CM

## 2015-07-20 DIAGNOSIS — M1990 Unspecified osteoarthritis, unspecified site: Secondary | ICD-10-CM | POA: Insufficient documentation

## 2015-07-20 NOTE — Progress Notes (Signed)
Patient ID: Rebekah Graham, female   DOB: 1929-02-08, 81 y.o.   MRN: [id number]  Provider:  Rexene Edison. Mariea Clonts, D.O., C.M.D. Location:  [facility] PCP: Ajanay Farve, DO  Code Status: DNR Goals of Care: Advanced Directive information Does patient have an advance directive?: Yes, Type of Advance Directive: [place];Out of facility DNR (pink MOST or yellow form), Pre-existing out of facility DNR order (yellow form or pink MOST form): Yellow form placed in chart (order not valid for inpatient use)   Chief Complaint  Patient presents with  . New Admit To SNF    Rehab for IV antibotic for open leg wound with exposed bone    HPI: Patient is a 81 y.o. female seen today for admission to [facility] s/p fall with left leg injury.  [name] normally lives in independent living.  She fell on 11/30  tripping over a curb. She developed 3 skin tears. One on the left posterior upper arm, one on the left upper shin and one on the left lower shin.  She is on pradaxa and developed a large hematoma and severe ecchymoses of her lower leg and foot.  She was seen in the ER by surgery and sent home with a plan to f/u with surgery and the wound care center. She has been on clindamycin and using norco for pain. The nurse at [facility] has been changing the dressing with vaseline gauze. When she was seen by NP Wert, there is serosanguineous drainage from the dressing but no erythema or purulent drainage per the resident and nurse. The resident was shaking yesterday but no other associated symptoms. She has received a tetanus injection in the ER.  Later in the day, she was noted to have a temp of 100.3 by the clinic nurse (12/5) and felt warm when she was going back to her IL from from the clinic.  She was reevaluated by the NP and started on vanc and zosyn in place of clindamycin and admitted to rehab.  She will see the wound care center later today.    She admits to continued severe pain in the  left lower leg during dressing changes primarily.  She has had continued drainage from the wound requiring the dressing to be changed often.  It is covered with mepilex and gauze and wrapped with unnaboot.  She is using hydrocodone for pain.   Review of Systems  Constitutional: Negative for fever and chills.  HENT: Negative for hearing loss.   Eyes: Negative for blurred vision.       Glasses  Respiratory: Negative for shortness of breath.   Cardiovascular: Positive for leg swelling. Negative for chest pain.  Gastrointestinal: Negative for abdominal pain.  Genitourinary: Negative for dysuria.  Musculoskeletal: Positive for falls.  Skin: Negative for rash.  Neurological: Negative for dizziness.  Endo/Heme/Allergies: Bruises/bleeds easily.  Psychiatric/Behavioral: Negative for depression and memory loss.    Past Medical History  Diagnosis Date  . Atrial tachycardia ([place])     s/p ablation 2008. [facility] (R free wall At[place])  . Atrial fibrillation ([place])   . HTN (hypertension)   . Hyperlipidemia   . Personality change due to conditions classified elsewhere   . Restless legs syndrome (RLS)   . Unspecified hereditary and idiopathic peripheral neuropathy   . Left bundle branch hemiblock   . Thumb pain 11/20/2012  . Rectocele 09/23/2012  . Atrophy of vulva 09/23/2012  . Rosacea 09/23/2012  . Actinic keratosis 09/23/2012  . Other malaise and fatigue  03/05/2012  . Loss of weight 03/05/2012  . Dysphagia, unspecified(787.20) 01/17/2012  . Blepharochalasis 03/01/2011  . Sebaceous cyst 03/01/2011  . Arthralgia of temporomandibular joint 12/30/2009  . Cardiac dysrhythmia, unspecified 11/10/2009  . Myalgia and myositis, unspecified 01/05/2009  . External hemorrhoids without mention of complication 123XX123  . 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 Dermatophytosis of nail 02/28/2008  . Internal hemorrhoids without mention of complication 99991111  . Pain in joint, site unspecified 02/28/2008  . Dizziness  and giddiness 04/03/2007  . Orthostatic hypotension 03/22/2007  . Edema 11/21/2006  . Palpitations 11/21/2006  . Insomnia, unspecified 11/18/2004   Past Surgical History  Procedure Laterality Date  . Catheter ablation  2008    for atrial tachycardia  . Total abdominal hysterectomy  2001    uterine bleeding  . Cyst excision  1998    mucus cyst removal from finger  . Bladder suspension  1998  . Cataract extraction w/ intraocular lens implant Right 4/8//2008    Dr. Charise Killian    reports that she has never smoked. She has never used smokeless tobacco. She reports that she drinks alcohol. She reports that she does not use illicit drugs. Family History  Problem Relation Age of Onset  . Heart disease Mother   . Heart disease Father   . Cancer Sister     breast    Allergies  Allergen Reactions  . Erythromycin Nausea And Vomiting  . Flecainide Acetate     Postural hypotension  . Penicillins   . Simvastatin     Pains in arms and legs      Medication List       This list is accurate as of: 07/20/15 11:59 PM.  Always use your most recent med list.               budesonide 180 MCG/ACT inhaler  Commonly known as:  PULMICORT FLEXHALER  Inhale one puff twice daily     cholecalciferol 1000 UNITS tablet  Commonly known as:  VITAMIN D  Take 1,000 Units by mouth daily.     clindamycin 300 MG capsule  Commonly known as:  CLEOCIN  Take 1 capsule (300 mg total) by mouth 4 (four) times daily.     dabigatran 150 MG Caps capsule  Commonly known as:  PRADAXA  Take one twice daily as anticoagulant     diltiazem 120 MG 24 hr capsule  Commonly known as:  DILACOR XR  Take 120 mg by mouth daily.     HYDROcodone-acetaminophen 5-325 MG tablet  Commonly known as:  NORCO/VICODIN  Take 1-2 tablets by mouth every 6 (six) hours as needed.     losartan 50 MG tablet  Commonly known as:  COZAAR  Take 50 mg by mouth daily.     metoprolol succinate 50 MG 24 hr tablet  Commonly known as:  TOPROL-XL   take 1 tablet by mouth three times a day TO CONTROL HEART RHYTHM     multivitamin tablet  Take 1 tablet by mouth daily.     PRESERVISION AREDS PO  Take 1 tablet by mouth daily.     simvastatin 40 MG tablet  Commonly known as:  ZOCOR  Take one tablet by mouth once daily to lower cholesterol     Turmeric Curcumin Caps  Take 1 capsule by mouth daily.        Physical Exam: Filed Vitals:   07/20/15 1033  BP: 159/72  Pulse: 82  Temp: 98.8 F (37.1 C)  Resp: 19  Height: 5\' 4"  (1.626 m)  Weight: 151 lb (68.493 kg)   Body mass index is 25.91 kg/(m^2). Physical Exam  Constitutional: She is oriented to person, place, and time. She appears well-developed and well-nourished. No distress.  HENT:  Head: Normocephalic and atraumatic.  Right Ear: External ear normal.  Left Ear: External ear normal.  Nose: Nose normal.  Mouth/Throat: Oropharynx is clear and moist. No oropharyngeal exudate.  Eyes: Conjunctivae and EOM are normal. Pupils are equal, round, and reactive to light.  glasses  Neck: Neck supple.  Cardiovascular: Normal heart sounds and intact distal pulses.   irreg irreg  Pulmonary/Chest: Effort normal and breath sounds normal. No respiratory distress.  Abdominal: Soft. Bowel sounds are normal. She exhibits no distension and no mass. There is no tenderness.  Musculoskeletal: Normal range of motion.  Neurological: She is alert and oriented to person, place, and time. No cranial nerve deficit.  Skin:  Ecchymoses of lower leg into foot; large laceration of anterior lower shin with bone exposed and hematoma on lateral aspect, draining serosanguinous fluid; upper shin with another laceration with skin mostly approximated; another skin tear on posterior left arm covered with clear dressing and well approximated  Psychiatric: She has a normal mood and affect.    Labs reviewed: Basic Metabolic Panel:  Recent Labs  09/29/14 03/30/15 07/19/15  NA 139 138 135*  K 4.2 4.1 3.9   BUN 14 15 14   CREATININE 0.8 0.8 1.0   Liver Function Tests:  Recent Labs  09/29/14 03/30/15 07/19/15  AST 20 20 19   ALT 12 10 10   ALKPHOS 59 63 60   No results for input(s): LIPASE, AMYLASE in the last 8760 hours. No results for input(s): AMMONIA in the last 8760 hours. CBC:  Recent Labs  03/30/15 07/19/15  WBC 4.8 6.6  HGB 14.0 10.9*  HCT 42 33*  PLT 225 265   Cardiac Enzymes: No results for input(s): CKTOTAL, CKMB, CKMBINDEX, TROPONINI in the last 8760 hours. BNP: Invalid input(s): POCBNP No results found for: HGBA1C No results found for: TSH No results found for: VITAMINB12 No results found for: FOLATE No results found for: IRON, TIBC, FERRITIN  Imaging and Procedures obtained prior to SNF admission: Left ankle xrays 07/14/15:  Negative for fracture or radiopaque foreign body.  Left tib/fib xrayss 07/14/15:  Negative for acute fracture. No radiopaque foreign body  Assessment/Plan: 1. 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 H/O fall -atypical for her but tripped on curb  3. Chronic atrial fibrillation (HCC) -cont diltiazem and metoprolol plus pradaxa as blood thinner -monitor rate and bp--may need adjustments  4. Long term current use of anticoagulant therapy -continues pradaxa--has a hematoma on left lower shin wound area  5. Essential hypertension -cont diltiazem, cozaar, metoprolol succinate (strange dose of bid for 24 hr med)  6. Chronic venous insufficiency -does have some baseline mild edema due to this -using coban wraps for her wound dressing, elevating feet at rest  Functional status: independent  Family/ staff Communication: did update her husband and discuss with rehab nurse and nurse manager  Labs/tests ordered:  F/u cbc, bmp in 2  days for more info  Rishik Tubby L. Dabria Wadas, D.O. [facility] [address], [place] [address] Cell  Phone (Mon-Fri 8am-5pm):  [phone] On Call:  [phone] & follow prompts after 5pm & weekends Office Phone:  [phone] Office Fax:  [phone]

## 2015-07-21 ENCOUNTER — Encounter: Payer: Medicare Other | Admitting: Internal Medicine

## 2015-07-23 ENCOUNTER — Encounter: Payer: Self-pay | Admitting: Adult Health

## 2015-07-23 ENCOUNTER — Non-Acute Institutional Stay (SKILLED_NURSING_FACILITY): Payer: Medicare Other | Admitting: Adult Health

## 2015-07-23 DIAGNOSIS — I10 Essential (primary) hypertension: Secondary | ICD-10-CM | POA: Diagnosis not present

## 2015-07-23 DIAGNOSIS — I480 Paroxysmal atrial fibrillation: Secondary | ICD-10-CM

## 2015-07-23 DIAGNOSIS — S86222A Laceration of muscle(s) and tendon(s) of anterior muscle group at lower leg level, left leg, initial encounter: Secondary | ICD-10-CM | POA: Diagnosis not present

## 2015-07-23 DIAGNOSIS — S81802D Unspecified open wound, left lower leg, subsequent encounter: Secondary | ICD-10-CM

## 2015-07-23 DIAGNOSIS — S81812D Laceration without foreign body, left lower leg, subsequent encounter: Secondary | ICD-10-CM

## 2015-07-23 NOTE — Progress Notes (Signed)
Patient ID: Rodolph Bong, female   DOB: September 05, 1928, 81 y.o.   MRN: [id number]     Nursing Home Location:  [facility]    Patient Care Team: Gayland Curry, DO as PCP - General (Geriatric Medicine) [facility] Jeri Cos, NP as Nurse Practitioner (Geriatric Medicine) Adrian Prows, MD as Consulting Physician (Cardiology) Earlean Polka, MD as Consulting Physician (Ophthalmology)   Place of Service: SNF (31)  Chief Complaint  Patient presents with  . Acute Visit    f/u wound    HPI:  81 y.o.female residing at [facility], rehab care section . I am here to evaluate her for discharge. She fell on 11/30 by tripping over a curb.  She developed 3 skin tears.  One on the left upper arm, one on the left upper shin and one on the left lower shin.She was seen in the ER by surgery and sent home with a plan to f/u with surgery and the wound care center.  She was admitted to rehab on 12/5 and placed on vanc and zosyn for chills and a temp of 100.3 on clindamycin. This was stopped by the wound care center. She is returning to the today for a wound dressing change. She has not had any fever, drainage, or erythema.   She has a hx of afib and her HR has ranged 82-113. Also, she has a hx of htn, bp has ranged 145-171/72-91.   She has not had any edema, sob, or cp.  Review of Systems:  Review of Systems  Constitutional: Positive for activity change. Negative for fever, chills, diaphoresis, appetite change and fatigue.  HENT: Negative for congestion.   Respiratory: Negative for cough and shortness of breath.   Cardiovascular: Negative for chest pain and leg swelling.  Gastrointestinal: Negative for constipation and abdominal distention.  Musculoskeletal: Positive for gait problem (using walker occasionally). Negative for back pain and arthralgias.  Neurological: Negative for dizziness.    Medications: Patient's Medications  New  Prescriptions   No medications on file  Previous Medications   BUDESONIDE (PULMICORT FLEXHALER) 180 MCG/ACT INHALER    Inhale one puff twice daily   CHOLECALCIFEROL (VITAMIN D) 1000 UNITS TABLET    Take 1,000 Units by mouth daily.   CLINDAMYCIN (CLEOCIN) 300 MG CAPSULE    Take 1 capsule (300 mg total) by mouth 4 (four) times daily.   DABIGATRAN (PRADAXA) 150 MG CAPS CAPSULE    Take one twice daily as anticoagulant   DILTIAZEM (DILACOR XR) 120 MG 24 HR CAPSULE    Take 120 mg by mouth daily.   HYDROCODONE-ACETAMINOPHEN (NORCO/VICODIN) 5-325 MG TABLET    Take 1-2 tablets by mouth every 6 (six) hours as needed.   LOSARTAN (COZAAR) 50 MG TABLET    Take 50 mg by mouth daily.   METOPROLOL SUCCINATE (TOPROL-XL) 50 MG 24 HR TABLET    take 1 tablet by mouth three times a day TO CONTROL HEART RHYTHM   MISC NATURAL PRODUCTS (TURMERIC CURCUMIN) CAPS    Take 1 capsule by mouth daily.     MULTIPLE VITAMIN (MULTIVITAMIN) TABLET    Take 1 tablet by mouth daily.   MULTIPLE VITAMINS-MINERALS (PRESERVISION AREDS PO)    Take 1 tablet by mouth daily.    SIMVASTATIN (ZOCOR) 40 MG TABLET    Take one tablet by mouth once daily to lower cholesterol  Modified Medications   No medications on file  Discontinued Medications   No medications on file  Physical Exam:  Filed Vitals:   07/23/15 1017  BP: 145/86  Pulse: 99  Temp: 98 F (36.7 C)  Resp: 15    Physical Exam  Constitutional: She is oriented to person, place, and time. No distress.  HENT:  Head: Normocephalic and atraumatic.  Neck: No JVD present.  Cardiovascular:  Irregular, rate 99, no edema  Pulmonary/Chest: Effort normal and breath sounds normal. No respiratory distress.  Abdominal: Soft. Bowel sounds are normal. She exhibits no distension.  Musculoskeletal: She exhibits no edema or tenderness.  Neurological: She is alert and oriented to person, place, and time.  Skin: Skin is warm and dry. She is not diaphoretic.  Left lower ext with  dressing in place wrapped with kerlix and sleeve. +CMS  Psychiatric: Affect normal.    Wt Readings from Last 3 Encounters:  07/20/15 151 lb (68.493 kg)  04/07/15 152 lb (68.947 kg)  10/06/14 150 lb (68.04 kg)     Labs reviewed/Significant Diagnostic Results:  Basic Metabolic Panel:  Recent Labs  09/29/14 03/30/15 07/19/15  NA 139 138 135*  K 4.2 4.1 3.9  BUN $Re'14 15 14  'BrQ$ CREATININE 0.8 0.8 1.0   Liver Function Tests:  Recent Labs  09/29/14 03/30/15 07/19/15  AST $Re'20 20 19  'GSt$ ALT $R'12 10 10  'Zz$ ALKPHOS 59 63 60   No results for input(s): LIPASE, AMYLASE in the last 8760 hours. No results for input(s): AMMONIA in the last 8760 hours. CBC:  Recent Labs  03/30/15 07/19/15  WBC 4.8 6.6  HGB 14.0 10.9*  HCT 42 33*  PLT 225 265   CBG: No results for input(s): GLUCAP in the last 8760 hours. TSH: No results for input(s): TSH in the last 8760 hours. A1C: No results found for: HGBA1C Lipid Panel:  Recent Labs  09/29/14  CHOL 150  HDL 47  LDLCALC 98  TRIG 93       Assessment/Plan  1. Skin tear of left lower leg without complication, subsequent encounter -continue dressing changes per wound center and monitor of temp while off antibiotics -most recent labs indicate normal WBC and only slightly elevated esr  2. Paroxysmal atrial fibrillation (HCC) -rate above goal, increase dilacor to 240 mg daily  3. Essential hypertension -above goal consistently, see #2 -monitor BP and pulse qshift over the weekend      Cindi Carbon, [place] [phone]

## 2015-07-26 ENCOUNTER — Encounter: Payer: Self-pay | Admitting: Adult Health

## 2015-07-26 ENCOUNTER — Non-Acute Institutional Stay (SKILLED_NURSING_FACILITY): Payer: Medicare Other | Admitting: Adult Health

## 2015-07-26 DIAGNOSIS — L089 Local infection of the skin and subcutaneous tissue, unspecified: Secondary | ICD-10-CM | POA: Diagnosis not present

## 2015-07-26 DIAGNOSIS — T148XXA Other injury of unspecified body region, initial encounter: Principal | ICD-10-CM

## 2015-07-26 DIAGNOSIS — T148 Other injury of unspecified body region: Secondary | ICD-10-CM

## 2015-07-26 DIAGNOSIS — I480 Paroxysmal atrial fibrillation: Secondary | ICD-10-CM

## 2015-07-26 DIAGNOSIS — I10 Essential (primary) hypertension: Secondary | ICD-10-CM

## 2015-07-26 LAB — CBC AND DIFFERENTIAL
HCT: 32 % — AB (ref 36–46)
HEMOGLOBIN: 10.7 g/dL — AB (ref 12.0–16.0)
Platelets: 333 10*3/uL (ref 150–399)
WBC: 6.2 10^3/mL

## 2015-07-26 LAB — POCT ERYTHROCYTE SEDIMENTATION RATE, NON-AUTOMATED: Sed Rate: 32 mm

## 2015-07-26 NOTE — Progress Notes (Signed)
Patient ID: Rebekah Graham, female   DOB: [birth year], 81 y.o.   MRN: [id number]     Nursing Home Location:  [facility]    Patient Care Team: Rebekah Curry, DO as PCP - General (Geriatric Medicine) Rebekah Champ Jeri Cos, NP as Nurse Practitioner (Geriatric Medicine) Rebekah Prows, MD as Consulting Physician (Cardiology) Rebekah Polka, MD as Consulting Physician (Ophthalmology)   Place of Service: SNF (31)  Chief Complaint  Patient presents with  . Acute Visit    f/u fever    HPI:  81 y.o.female residing at [facility], residing in rehab section . I am evaluating her for low grade fever noted on 12/11 at 99.5 and 100.3  She fell on 11/30 by tripping over a curb.  She developed 3 skin tears.  One on the left upper arm, one on the left upper shin and one on the left lower shin.She was seen in the ER by surgery and sent home with a plan to f/u with surgery and the wound care center.  She was admitted to rehab on 12/5 and placed on vanc and zosyn for chills and a temp of 100.3 on clindamycin, considering there was bone exposed in the wound bed. A wound culture was obtained but there was no growth. This was stopped by the wound care center due to a normal, healing wound the following day. Today she is reporting chills and has green drainage from her wound. She is on Dilacor for afib and this was increased on 12/9 due to an elevated HR and BP.  Her Hr and BP have responded nicely.  Review of Systems:  Review of Systems  Constitutional: Positive for fever (low grade), chills and activity change (needs walker ). Negative for diaphoresis, appetite change and fatigue.  HENT: Negative for congestion.   Respiratory: Negative for cough and shortness of breath.   Cardiovascular: Negative for chest pain, palpitations and leg swelling.  Gastrointestinal: Negative for constipation and abdominal distention.  Musculoskeletal: Positive for  gait problem (using walker occasionally). Negative for back pain and arthralgias.  Skin: Positive for wound (purulent drainage and odor).  Neurological: Negative for dizziness.    Medications: Patient's Medications  New Prescriptions   No medications on file  Previous Medications   BUDESONIDE (PULMICORT FLEXHALER) 180 MCG/ACT INHALER    Inhale one puff twice daily   CHOLECALCIFEROL (VITAMIN D) 1000 UNITS TABLET    Take 1,000 Units by mouth daily.   DABIGATRAN (PRADAXA) 150 MG CAPS CAPSULE    Take one twice daily as anticoagulant   DILTIAZEM (DILACOR XR) 240 MG 24 HR CAPSULE    Take 240 mg by mouth daily.   HYDROCODONE-ACETAMINOPHEN (NORCO/VICODIN) 5-325 MG TABLET    Take 1-2 tablets by mouth every 6 (six) hours as needed.   LACTOSE FREE NUTRITION (BOOST) LIQD    Take 237 mLs by mouth 3 (three) times daily between meals.   LOSARTAN (COZAAR) 50 MG TABLET    Take 50 mg by mouth daily.   METOPROLOL SUCCINATE (TOPROL-XL) 50 MG 24 HR TABLET    take 1 tablet by mouth three times a day TO CONTROL HEART RHYTHM   MISC NATURAL PRODUCTS (TURMERIC CURCUMIN) CAPS    Take 1 capsule by mouth daily.     MULTIPLE VITAMIN (MULTIVITAMIN) TABLET    Take 1 tablet by mouth daily.   MULTIPLE VITAMINS-MINERALS (PRESERVISION AREDS PO)    Take 1 tablet by mouth daily.    SIMVASTATIN (ZOCOR) 40  MG TABLET    Take one tablet by mouth once daily to lower cholesterol   VITAMIN C (ASCORBIC ACID) 500 MG TABLET    Take 500 mg by mouth daily.  Modified Medications   No medications on file  Discontinued Medications   CLINDAMYCIN (CLEOCIN) 300 MG CAPSULE    Take 1 capsule (300 mg total) by mouth 4 (four) times daily.   DILTIAZEM (DILACOR XR) 120 MG 24 HR CAPSULE    Take 120 mg by mouth daily.     Physical Exam:  Filed Vitals:   07/26/15 1230  Temp: 98.5 F (36.9 C)    Physical Exam  Constitutional: She is oriented to person, place, and time. No distress.  HENT:  Head: Normocephalic and atraumatic.  Neck: No JVD  present.  Cardiovascular:  Irregular, no edema  Pulmonary/Chest: Effort normal and breath sounds normal. No respiratory distress.  Abdominal: Soft. Bowel sounds are normal. She exhibits no distension.  Musculoskeletal: She exhibits no edema or tenderness.  Neurological: She is alert and oriented to person, place, and time.  Skin: Skin is warm and dry. She is not diaphoretic.  Left lower leg wound with 80% pink wound bed, 20% whitish/yellow, beginning to fill in.  There is an odor and green/serosang. Drainage coming from the wound. Upper left leg skin tear without drainage or erythema. Faint but palpable pulse to left lower leg.  Psychiatric: Affect normal.    Wt Readings from Last 3 Encounters:  07/20/15 151 lb (68.493 kg)  04/07/15 152 lb (68.947 kg)  10/06/14 150 lb (68.04 kg)     Labs reviewed/Significant Diagnostic Results:  Basic Metabolic Panel:  Recent Labs  09/29/14 03/30/15 07/19/15  NA 139 138 135*  K 4.2 4.1 3.9  BUN $Re'14 15 14  'guO$ CREATININE 0.8 0.8 1.0   Liver Function Tests:  Recent Labs  09/29/14 03/30/15 07/19/15  AST $Re'20 20 19  'MRU$ ALT $R'12 10 10  'LI$ ALKPHOS 59 63 60   No results for input(s): LIPASE, AMYLASE in the last 8760 hours. No results for input(s): AMMONIA in the last 8760 hours. CBC:  Recent Labs  03/30/15 07/19/15  WBC 4.8 6.6  HGB 14.0 10.9*  HCT 42 33*  PLT 225 265   CBG: No results for input(s): GLUCAP in the last 8760 hours. TSH: No results for input(s): TSH in the last 8760 hours. A1C: No results found for: HGBA1C Lipid Panel:  Recent Labs  09/29/14  CHOL 150  HDL 47  LDLCALC 98  TRIG 93       Assessment/Plan  1. Infected wound -[name] is noted to have chills and low grade fever with purulent drainage and odor from the wound. We will reculture the wound and start Doxycycline 100 mg BID for 10 days -she will follow up with the wound center and I will fax my note to them -Despite the above findings her wound is filling and  reducing in size. -will recheck CBC with diff and ESR (follow trend to monitor for osteomyelitis)  2. Paroxysmal atrial fibrillation (HCC) -improved, continue Dilacor at 240 mg daily -elevated rate most likely due to underlying infection  3. Essential hypertension -improved -continue Dilacor at 240 mg dialy      Cindi Carbon, [place] [phone]

## 2015-07-27 ENCOUNTER — Encounter: Payer: Self-pay | Admitting: Internal Medicine

## 2015-07-27 ENCOUNTER — Non-Acute Institutional Stay (SKILLED_NURSING_FACILITY): Payer: Medicare Other | Admitting: Internal Medicine

## 2015-07-27 DIAGNOSIS — L089 Local infection of the skin and subcutaneous tissue, unspecified: Secondary | ICD-10-CM

## 2015-07-27 DIAGNOSIS — S81802D Unspecified open wound, left lower leg, subsequent encounter: Secondary | ICD-10-CM

## 2015-07-27 DIAGNOSIS — S81812D Laceration without foreign body, left lower leg, subsequent encounter: Secondary | ICD-10-CM

## 2015-07-27 DIAGNOSIS — T148XXA Other injury of unspecified body region, initial encounter: Principal | ICD-10-CM

## 2015-07-27 DIAGNOSIS — T148 Other injury of unspecified body region: Secondary | ICD-10-CM | POA: Diagnosis not present

## 2015-07-27 DIAGNOSIS — S86222A Laceration of muscle(s) and tendon(s) of anterior muscle group at lower leg level, left leg, initial encounter: Secondary | ICD-10-CM | POA: Diagnosis not present

## 2015-07-27 DIAGNOSIS — I4891 Unspecified atrial fibrillation: Secondary | ICD-10-CM | POA: Diagnosis not present

## 2015-07-27 DIAGNOSIS — I482 Chronic atrial fibrillation: Secondary | ICD-10-CM | POA: Diagnosis not present

## 2015-07-27 DIAGNOSIS — I872 Venous insufficiency (chronic) (peripheral): Secondary | ICD-10-CM | POA: Diagnosis not present

## 2015-07-27 DIAGNOSIS — I10 Essential (primary) hypertension: Secondary | ICD-10-CM | POA: Diagnosis not present

## 2015-07-27 NOTE — Progress Notes (Signed)
Patient ID: Rebekah Graham, female   DOB: 1929-07-18, 81 y.o.   MRN: [id number]   Location: [facility] Provider: Gwenith Spitz. Renato Gails, D.O., C.M.D.  Code Status: DNR Goals of Care: Advanced Directive information Advanced Directives 07/27/2015  Does patient have an advance directive? Yes  Type of Estate agent of [place];Out of facility DNR (pink MOST or yellow form)  Does patient want to make changes to advanced directive? -  Copy of advanced directive(s) in chart? Yes  Pre-existing out of facility DNR order (yellow form or pink MOST form) Yellow form placed in chart (order not valid for inpatient use)     Chief Complaint  Patient presents with  . Acute Visit    follow up on temperature, wound infection on left leg    HPI: Patient is a 81 y.o. female seen in the office today for an acute visit to f/u on her fevers, left leg wound infection. Per nursing staff and patient, she has not had any more fevers even low grade since 07/25/15.  Staff had requested that I look at her wound today during dressing change due to green discharge.  She is completing a course of doxy for 10 days.  She also reports overall improvement at this point.  Leg pain is primarily during her dressing changes.    Review of Systems:  Review of Systems  Constitutional: Negative for fever, chills and malaise/fatigue.  Respiratory: Negative for shortness of breath.   Cardiovascular: Positive for leg swelling. Negative for chest pain and palpitations.       Left  Musculoskeletal:       No new falls since injury  Skin:       Left leg two wounds, left arm laceration healing  Neurological: Negative for weakness.    Past Medical History  Diagnosis Date  . Atrial tachycardia (HCC)     s/p ablation 2008. [facility] (R free wall Atach)  . Atrial fibrillation (HCC)   . HTN (hypertension)   . Hyperlipidemia   . Personality change due to conditions classified elsewhere   . Restless legs syndrome  (RLS)   . Unspecified hereditary and idiopathic peripheral neuropathy   . Left bundle branch hemiblock   . Thumb pain 11/20/2012  . Rectocele 09/23/2012  . Atrophy of vulva 09/23/2012  . Rosacea 09/23/2012  . Actinic keratosis 09/23/2012  . Other malaise and fatigue 03/05/2012  . Loss of weight 03/05/2012  . Dysphagia, unspecified(787.20) 01/17/2012  . Blepharochalasis 03/01/2011  . Sebaceous cyst 03/01/2011  . Arthralgia of temporomandibular joint 12/30/2009  . Cardiac dysrhythmia, unspecified 11/10/2009  . Myalgia and myositis, unspecified 01/05/2009  . External hemorrhoids without mention of complication 07/28/2008  . 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 Dermatophytosis of nail 02/28/2008  . Internal hemorrhoids without mention of complication 02/28/2008  . Pain in joint, site unspecified 02/28/2008  . Dizziness and giddiness 04/03/2007  . Orthostatic hypotension 03/22/2007  . Edema 11/21/2006  . Palpitations 11/21/2006  . Insomnia, unspecified 11/18/2004    Past Surgical History  Procedure Laterality Date  . Catheter ablation  2008    for atrial tachycardia  . Total abdominal hysterectomy  2001    uterine bleeding  . Cyst excision  1998    mucus cyst removal from finger  . Bladder suspension  1998  . Cataract extraction w/ intraocular lens implant Right 4/8//2008    Dr. Emmit Pomfret    Allergies  Allergen Reactions  . Erythromycin Nausea And Vomiting  . Flecainide Acetate  Postural hypotension  . Penicillins   . Simvastatin     Pains in arms and legs      Medication List       This list is accurate as of: 07/27/15 11:59 PM.  Always use your most recent med list.               cholecalciferol 1000 units tablet  Commonly known as:  VITAMIN D  Take 1,000 Units by mouth daily.     dabigatran 150 MG Caps capsule  Commonly known as:  PRADAXA  Take one twice daily as anticoagulant     diltiazem 240 MG 24 hr capsule  Commonly known as:  DILACOR XR  Take 240 mg by mouth daily.      doxycycline 100 MG capsule  Commonly known as:  VIBRAMYCIN  Take 100 mg by mouth. Take one twice daily for infection for 10 days     HYDROcodone-acetaminophen 5-325 MG tablet  Commonly known as:  NORCO/VICODIN  Take 1-2 tablets by mouth every 6 (six) hours as needed.     lactose free nutrition Liqd  Take 237 mLs by mouth. One twice a day     losartan 50 MG tablet  Commonly known as:  COZAAR  Take 50 mg by mouth daily.     metoprolol succinate 50 MG 24 hr tablet  Commonly known as:  TOPROL-XL  Take 50 mg by mouth. Take one tablet twice daily for heart rhythm     multivitamin tablet  Take 1 tablet by mouth daily.     PRESERVISION AREDS PO  Take 1 tablet by mouth daily.     simvastatin 40 MG tablet  Commonly known as:  ZOCOR  Take one tablet by mouth once daily to lower cholesterol     Turmeric Curcumin Caps  Take 1 capsule by mouth daily.     vitamin C 500 MG tablet  Commonly known as:  ASCORBIC ACID  Take 500 mg by mouth daily.        Physical Exam: Filed Vitals:   07/27/15 1041  BP: 175/96  Pulse: 92  Temp: 97.1 F (36.2 C)  Resp: 18  Height: $Remove'5\' 4"'eWYdTkn$  (1.626 m)  Weight: 152 lb (68.947 kg)   Body mass index is 26.08 kg/(m^2). Physical Exam  Constitutional: She is oriented to person, place, and time. She appears well-developed and well-nourished. No distress.  Cardiovascular: Normal heart sounds and intact distal pulses.   Slight tachy, irreg  Pulmonary/Chest: Effort normal and breath sounds normal. No respiratory distress.  Musculoskeletal: Normal range of motion.  Neurological: She is alert and oriented to person, place, and time.  Skin:  Left lower leg wounds:  Upper wound with small open area still present; lower wound primarily closed and has raw pink tissue on lower aspect and dark greenish gray tissue on lateral aspect of the wound/eschar forming; dressing remain moist during changes with some sanguinous and some yellow/green drainage    Labs  reviewed: Basic Metabolic Panel:  Recent Labs  09/29/14 03/30/15 07/19/15  NA 139 138 135*  K 4.2 4.1 3.9  BUN $Re'14 15 14  'JSl$ CREATININE 0.8 0.8 1.0   Liver Function Tests:  Recent Labs  09/29/14 03/30/15 07/19/15  AST $Re'20 20 19  'IKQ$ ALT $R'12 10 10  'ge$ ALKPHOS 59 63 60   No results for input(s): LIPASE, AMYLASE in the last 8760 hours. No results for input(s): AMMONIA in the last 8760 hours. CBC:  Recent Labs  03/30/15 07/19/15 07/26/15  WBC 4.8 6.6 6.2  HGB 14.0 10.9* 10.7*  HCT 42 33* 32*  PLT 225 265 333   Lipid Panel:  Recent Labs  09/29/14  CHOL 150  HDL 47  LDLCALC 98  TRIG 93   No results found for: HGBA1C  Assessment/Plan 1. Post-traumatic wound infection ([place]) Appears this is gradually improving and healing with doxycyline abx and wound care at [facility] -complete abx course and keep wound care f/u -f/u cbc, esr next visit  2. Skin tear of left lower leg without complication, subsequent encounter Upper wound healing well w/o s/s of infection  3. Atrial fibrillation with rapid ventricular response (HCC) -rate a little better today since adjustment of CCB, but remains higher than normal--cont to monitor -some of this may be pain that she does not report, as well   Labs/tests ordered:   Orders Placed This Encounter  Procedures  . CBC and differential    This external order was created through the Results Console.  Marland Kitchen POCT erythrocyte sed rate, Non-automated    This external order was created through the Results Console.    Bessy Reaney L. Kelce Bouton, D.O. [facility] [address], [place] [address] Cell Phone (Mon-Fri 8am-5pm):  [phone] On Call:  [phone] & follow prompts after 5pm & weekends Office Phone:  [phone] Office Fax:  [phone]

## 2015-07-28 ENCOUNTER — Encounter (HOSPITAL_BASED_OUTPATIENT_CLINIC_OR_DEPARTMENT_OTHER): Payer: Medicare (Managed Care)

## 2015-07-30 ENCOUNTER — Encounter: Payer: Self-pay | Admitting: Adult Health

## 2015-07-30 ENCOUNTER — Non-Acute Institutional Stay (SKILLED_NURSING_FACILITY): Payer: Medicare Other | Admitting: Adult Health

## 2015-07-30 DIAGNOSIS — E785 Hyperlipidemia, unspecified: Secondary | ICD-10-CM

## 2015-07-30 DIAGNOSIS — T148 Other injury of unspecified body region: Secondary | ICD-10-CM | POA: Diagnosis not present

## 2015-07-30 DIAGNOSIS — L089 Local infection of the skin and subcutaneous tissue, unspecified: Secondary | ICD-10-CM | POA: Diagnosis not present

## 2015-07-30 DIAGNOSIS — I480 Paroxysmal atrial fibrillation: Secondary | ICD-10-CM

## 2015-07-30 DIAGNOSIS — I10 Essential (primary) hypertension: Secondary | ICD-10-CM

## 2015-07-30 DIAGNOSIS — T148XXA Other injury of unspecified body region, initial encounter: Principal | ICD-10-CM

## 2015-07-30 NOTE — Progress Notes (Signed)
Patient ID: Rebekah Graham, female   DOB: 1929-04-19, 81 y.o.   MRN: [id number]     Nursing Home Location:  [place]    Patient Care Team: Gayland Curry, DO as PCP - General (Geriatric Medicine) [facility] Jeri Cos, NP as Nurse Practitioner (Geriatric Medicine) Adrian Prows, MD as Consulting Physician (Cardiology) Earlean Polka, MD as Consulting Physician (Ophthalmology)   Place of Service: SNF (31)  Chief Complaint  Patient presents with  . Acute Visit    f/u wound, ?d/c    HPI:  81 y.o.female residing at [facility], residing in rehab section . She fell on 11/30 by tripping over a curb.  She developed 3 skin tears.  One on the left upper arm, one on the left upper shin and one on the left lower shin.She was seen in the ER by surgery and sent home with a plan to f/u with surgery and the wound care center.  She was admitted to rehab on 12/5 and placed on vanc and zosyn for chills and a temp of 100.3 on clindamycin, considering there was bone exposed in the wound bed. A wound culture was obtained but there was no growth. This was stopped by the wound care center due to a normal, healing wound the following day. She developed green drainage, odor from the wound, and a temp of 100.3 and was placed on Doxycycline. Her wound culture did not show any dominant bacterial growth.  She is on Dilacor for afib and this was increased on 12/9 due to an elevated HR and BP.  Her HR has ranged 55-105 over the past two days.  She continues to have a low grade temp each day of 99.5.  She continues with some drainage from the wound (yellow and red) but there is no more green drainage. She denies any increase in pain from the wound. The wound continues to fill in. The wound care center changed her dressing to include a collagen dressing and calcium alginate.  She denies any cough, congestion, urinary symptoms, SOB, CP, etc. ESR has been 32,  CBC shows normal WBC with no left shift.  Review of Systems:  Review of Systems  Constitutional: Positive for fever (low grade). Negative for chills, diaphoresis, activity change, appetite change and fatigue.  HENT: Negative for congestion.   Respiratory: Negative for cough and shortness of breath.   Cardiovascular: Negative for chest pain, palpitations and leg swelling.  Gastrointestinal: Negative for constipation and abdominal distention.  Genitourinary: Negative for dysuria and difficulty urinating.  Musculoskeletal: Positive for gait problem (using walker occasionally). Negative for back pain and arthralgias.  Skin: Positive for wound (purulent drainage and odor).  Neurological: Negative for dizziness.    Medications: Patient's Medications  New Prescriptions   No medications on file  Previous Medications   CHOLECALCIFEROL (VITAMIN D) 1000 UNITS TABLET    Take 1,000 Units by mouth daily.   DABIGATRAN (PRADAXA) 150 MG CAPS CAPSULE    Take one twice daily as anticoagulant   DILTIAZEM (DILACOR XR) 240 MG 24 HR CAPSULE    Take 240 mg by mouth daily.   DOXYCYCLINE (VIBRAMYCIN) 100 MG CAPSULE    Take 100 mg by mouth. Take one twice daily for infection for 10 days   HYDROCODONE-ACETAMINOPHEN (NORCO/VICODIN) 5-325 MG TABLET    Take 1-2 tablets by mouth every 6 (six) hours as needed.   LACTOSE FREE NUTRITION (BOOST) LIQD    Take 237 mLs by mouth. One twice  a day   LOSARTAN (COZAAR) 50 MG TABLET    Take 50 mg by mouth daily.   METOPROLOL SUCCINATE (TOPROL-XL) 50 MG 24 HR TABLET    Take 50 mg by mouth. Take one tablet twice daily for heart rhythm   MISC NATURAL PRODUCTS (TURMERIC CURCUMIN) CAPS    Take 1 capsule by mouth daily.     MULTIPLE VITAMIN (MULTIVITAMIN) TABLET    Take 1 tablet by mouth daily.   MULTIPLE VITAMINS-MINERALS (PRESERVISION AREDS PO)    Take 1 tablet by mouth daily.    SIMVASTATIN (ZOCOR) 40 MG TABLET    Take one tablet by mouth once daily to lower cholesterol   VITAMIN C  (ASCORBIC ACID) 500 MG TABLET    Take 500 mg by mouth daily.  Modified Medications   No medications on file  Discontinued Medications   No medications on file     Physical Exam:  Filed Vitals:   07/30/15 1126  BP: 141/85  Pulse: 105  Temp: 97 F (36.1 C)  Resp: 20    Physical Exam  Constitutional: She is oriented to person, place, and time. No distress.  HENT:  Head: Normocephalic and atraumatic.  Neck: No JVD present.  Cardiovascular:  Irregular, no edema. +CMS to LLE  Pulmonary/Chest: Effort normal and breath sounds normal. No respiratory distress.  Abdominal: Soft. Bowel sounds are normal. She exhibits no distension.  No CVA tenderness or s/p tenderness  Musculoskeletal: She exhibits no edema or tenderness.  Neurological: She is alert and oriented to person, place, and time.  Skin: Skin is warm and dry. She is not diaphoretic.  Left lower leg wound with mostly pink tissue, continues to fill in.  Mild odor present, improved. Drainage coming from the wound is yellow/sang, thick, moderate amount. Upper left leg skin tear healed.  Psychiatric: Affect normal.    Wt Readings from Last 3 Encounters:  07/27/15 152 lb (68.947 kg)  07/20/15 151 lb (68.493 kg)  04/07/15 152 lb (68.947 kg)     Labs reviewed/Significant Diagnostic Results:  Basic Metabolic Panel:  Recent Labs  09/29/14 03/30/15 07/19/15  NA 139 138 135*  K 4.2 4.1 3.9  BUN _0 CREATININE 0.8 0.8 1.0   Liver Function Tests:  Recent Labs  09/29/14 03/30/15 07/19/15  AST _1 ALT _2 ALKPHOS 59 63 60   No results for input(s): LIPASE, AMYLASE in the last 8760 hours. No results for input(s): AMMONIA in the last 8760 hours. CBC:  Recent Labs  03/30/15 07/19/15 07/26/15  WBC 4.8 6.6 6.2  HGB 14.0 10.9* 10.7*  HCT 42 33* 32*  PLT 225 265 333   CBG: No results for input(s): GLUCAP in the last 8760 hours. TSH: No results for input(s): TSH in the last 8760 hours. A1C: No  results found for: HGBA1C Lipid Panel:  Recent Labs  09/29/14  CHOL 150  HDL 47  LDLCALC 98  TRIG 93       Assessment/Plan  1. Infected wound -some improvement, however, resident continues with low grade temps with no other obvious reason -wound continues to fill in, drainage is less but still present, less green in color -continue doxy to complete course -f/u with wound care on Tuesday 12/20 -may d/c home if no temp >100 or other s/s of infection -staff to perform dressing changes as per wound care center orders TID, resident may remove dressing for showers per wound center -continue prn norco for pain  with dressing changes -continue boost and Vitamin C for wound healing  2. Afib -improved rate with some variability -continue cardizem, metoprolol -continue pradaxa for CVA risk reduction  3. HTN -improved, BMP WNL -continue current meds  4. HLD -continue zocor, followed outpatient    Resident will need f/u with Dr. Mariea Clonts in 1-2 weeks in regards to wound infection, afib, and htn. May discharge on Monday as her rehab days are ending at [facility]. Staff to follow wound outpatient with [facility] during dressing changes. Resident has been educated upon how to remove the dressing prior to showers and s/s of infection.       Cindi Carbon, ANP [facility] [phone]

## 2015-08-03 DIAGNOSIS — I482 Chronic atrial fibrillation: Secondary | ICD-10-CM | POA: Diagnosis not present

## 2015-08-03 DIAGNOSIS — I872 Venous insufficiency (chronic) (peripheral): Secondary | ICD-10-CM | POA: Diagnosis not present

## 2015-08-03 DIAGNOSIS — I10 Essential (primary) hypertension: Secondary | ICD-10-CM | POA: Diagnosis not present

## 2015-08-03 DIAGNOSIS — S86222A Laceration of muscle(s) and tendon(s) of anterior muscle group at lower leg level, left leg, initial encounter: Secondary | ICD-10-CM | POA: Diagnosis not present

## 2015-08-08 DIAGNOSIS — Z7901 Long term (current) use of anticoagulants: Secondary | ICD-10-CM | POA: Diagnosis not present

## 2015-08-08 DIAGNOSIS — S86222D Laceration of muscle(s) and tendon(s) of anterior muscle group at lower leg level, left leg, subsequent encounter: Secondary | ICD-10-CM | POA: Diagnosis not present

## 2015-08-08 DIAGNOSIS — Z4800 Encounter for change or removal of nonsurgical wound dressing: Secondary | ICD-10-CM | POA: Diagnosis not present

## 2015-08-08 DIAGNOSIS — I482 Chronic atrial fibrillation: Secondary | ICD-10-CM | POA: Diagnosis not present

## 2015-08-10 DIAGNOSIS — I10 Essential (primary) hypertension: Secondary | ICD-10-CM | POA: Diagnosis not present

## 2015-08-10 DIAGNOSIS — S86222A Laceration of muscle(s) and tendon(s) of anterior muscle group at lower leg level, left leg, initial encounter: Secondary | ICD-10-CM | POA: Diagnosis not present

## 2015-08-10 DIAGNOSIS — I482 Chronic atrial fibrillation: Secondary | ICD-10-CM | POA: Diagnosis not present

## 2015-08-10 DIAGNOSIS — I872 Venous insufficiency (chronic) (peripheral): Secondary | ICD-10-CM | POA: Diagnosis not present

## 2015-08-11 ENCOUNTER — Encounter: Payer: Medicare Other | Admitting: Internal Medicine

## 2015-08-17 ENCOUNTER — Encounter (HOSPITAL_BASED_OUTPATIENT_CLINIC_OR_DEPARTMENT_OTHER): Payer: Medicare Other | Attending: Internal Medicine

## 2015-08-17 DIAGNOSIS — I482 Chronic atrial fibrillation: Secondary | ICD-10-CM | POA: Insufficient documentation

## 2015-08-17 DIAGNOSIS — G629 Polyneuropathy, unspecified: Secondary | ICD-10-CM | POA: Insufficient documentation

## 2015-08-17 DIAGNOSIS — M1990 Unspecified osteoarthritis, unspecified site: Secondary | ICD-10-CM | POA: Insufficient documentation

## 2015-08-17 DIAGNOSIS — I10 Essential (primary) hypertension: Secondary | ICD-10-CM | POA: Diagnosis not present

## 2015-08-17 DIAGNOSIS — S86222A Laceration of muscle(s) and tendon(s) of anterior muscle group at lower leg level, left leg, initial encounter: Secondary | ICD-10-CM | POA: Insufficient documentation

## 2015-08-17 DIAGNOSIS — W19XXXA Unspecified fall, initial encounter: Secondary | ICD-10-CM | POA: Diagnosis not present

## 2015-08-24 DIAGNOSIS — S86222A Laceration of muscle(s) and tendon(s) of anterior muscle group at lower leg level, left leg, initial encounter: Secondary | ICD-10-CM | POA: Diagnosis not present

## 2015-08-31 DIAGNOSIS — S86222A Laceration of muscle(s) and tendon(s) of anterior muscle group at lower leg level, left leg, initial encounter: Secondary | ICD-10-CM | POA: Diagnosis not present

## 2015-09-07 DIAGNOSIS — S86222A Laceration of muscle(s) and tendon(s) of anterior muscle group at lower leg level, left leg, initial encounter: Secondary | ICD-10-CM | POA: Diagnosis not present

## 2015-09-14 DIAGNOSIS — S86222A Laceration of muscle(s) and tendon(s) of anterior muscle group at lower leg level, left leg, initial encounter: Secondary | ICD-10-CM | POA: Diagnosis not present

## 2015-09-21 ENCOUNTER — Encounter (HOSPITAL_BASED_OUTPATIENT_CLINIC_OR_DEPARTMENT_OTHER): Payer: Medicare Other | Attending: Internal Medicine

## 2015-09-21 DIAGNOSIS — S86222A Laceration of muscle(s) and tendon(s) of anterior muscle group at lower leg level, left leg, initial encounter: Secondary | ICD-10-CM | POA: Diagnosis not present

## 2015-09-21 DIAGNOSIS — I872 Venous insufficiency (chronic) (peripheral): Secondary | ICD-10-CM | POA: Diagnosis not present

## 2015-09-21 DIAGNOSIS — I482 Chronic atrial fibrillation: Secondary | ICD-10-CM | POA: Insufficient documentation

## 2015-09-21 DIAGNOSIS — L97829 Non-pressure chronic ulcer of other part of left lower leg with unspecified severity: Secondary | ICD-10-CM | POA: Insufficient documentation

## 2015-09-21 DIAGNOSIS — W19XXXA Unspecified fall, initial encounter: Secondary | ICD-10-CM | POA: Insufficient documentation

## 2015-09-21 DIAGNOSIS — Z7901 Long term (current) use of anticoagulants: Secondary | ICD-10-CM | POA: Diagnosis not present

## 2015-09-28 DIAGNOSIS — L97829 Non-pressure chronic ulcer of other part of left lower leg with unspecified severity: Secondary | ICD-10-CM | POA: Diagnosis not present

## 2015-09-28 LAB — CBC AND DIFFERENTIAL
HEMATOCRIT: 44 % (ref 36–46)
Hemoglobin: 14.2 g/dL (ref 12.0–16.0)
PLATELETS: 254 10*3/uL (ref 150–399)
WBC: 5 10^3/mL

## 2015-09-28 LAB — HEPATIC FUNCTION PANEL
ALT: 11 U/L (ref 7–35)
AST: 21 U/L (ref 13–35)
Alkaline Phosphatase: 65 U/L (ref 25–125)
Bilirubin, Total: 0.5 mg/dL

## 2015-09-28 LAB — BASIC METABOLIC PANEL
BUN: 19 mg/dL (ref 4–21)
Creatinine: 0.8 mg/dL (ref 0.5–1.1)
Glucose: 84 mg/dL
Potassium: 4.4 mmol/L (ref 3.4–5.3)
Sodium: 141 mmol/L (ref 137–147)

## 2015-09-28 LAB — LIPID PANEL
CHOLESTEROL: 152 mg/dL (ref 0–200)
HDL: 44 mg/dL (ref 35–70)
LDL CALC: 95 mg/dL
LDL/HDL RATIO: 2.2
TRIGLYCERIDES: 90 mg/dL (ref 40–160)

## 2015-09-29 ENCOUNTER — Other Ambulatory Visit: Payer: Self-pay

## 2015-10-05 DIAGNOSIS — L97829 Non-pressure chronic ulcer of other part of left lower leg with unspecified severity: Secondary | ICD-10-CM | POA: Diagnosis not present

## 2015-10-06 ENCOUNTER — Non-Acute Institutional Stay: Payer: Medicare Other | Admitting: Internal Medicine

## 2015-10-06 ENCOUNTER — Encounter: Payer: Self-pay | Admitting: Internal Medicine

## 2015-10-06 VITALS — BP 100/62 | HR 60 | Temp 98.2°F | Ht 64.0 in | Wt 150.0 lb

## 2015-10-06 DIAGNOSIS — I10 Essential (primary) hypertension: Secondary | ICD-10-CM | POA: Diagnosis not present

## 2015-10-06 DIAGNOSIS — S81812S Laceration without foreign body, left lower leg, sequela: Secondary | ICD-10-CM

## 2015-10-06 DIAGNOSIS — I480 Paroxysmal atrial fibrillation: Secondary | ICD-10-CM

## 2015-10-06 DIAGNOSIS — E785 Hyperlipidemia, unspecified: Secondary | ICD-10-CM

## 2015-10-06 DIAGNOSIS — M19041 Primary osteoarthritis, right hand: Secondary | ICD-10-CM

## 2015-10-06 DIAGNOSIS — I872 Venous insufficiency (chronic) (peripheral): Secondary | ICD-10-CM

## 2015-10-06 DIAGNOSIS — Z0000 Encounter for general adult medical examination without abnormal findings: Secondary | ICD-10-CM | POA: Diagnosis not present

## 2015-10-06 DIAGNOSIS — N3941 Urge incontinence: Secondary | ICD-10-CM | POA: Diagnosis not present

## 2015-10-06 DIAGNOSIS — S81802S Unspecified open wound, left lower leg, sequela: Secondary | ICD-10-CM | POA: Diagnosis not present

## 2015-10-06 MED ORDER — DICLOFENAC SODIUM 1 % TD GEL: 2.0000 g | Freq: Four times a day (QID) | TRANSDERMAL | Status: DC

## 2015-10-06 NOTE — Progress Notes (Signed)
Patient ID: Rebekah Graham, female   DOB: 08-26-1928, 82 y.o.   MRN: [id number] MMSE 29/30 passed clock drawing

## 2015-10-06 NOTE — Progress Notes (Signed)
Patient ID: Rebekah Graham, female   DOB: 1929/01/02, 82 y.o.   MRN: [id number]   Location:  [facility] (12) Provider: Alexey Rhoads L. Mariea Clonts, D.O., C.M.D.  Patient Care Team: Rebekah Curry, DO as PCP - General (Geriatric Medicine) [facility] [place] Rebekah Cos, NP as Nurse Practitioner (Geriatric Medicine) Rebekah Prows, MD as Consulting Physician (Cardiology) Rebekah Polka, MD as Consulting Physician (Ophthalmology)  Extended Emergency Contact Information Primary Emergency Contact: Rebekah Graham of [place] Phone: [phone] Work Phone: [phone] Relation: Son Secondary Emergency Contact: Rebekah Graham,[name] Address: [address], [place] Phone: [phone] Relation: Spouse  Code Status: DNR Goals of Care: Advanced Directive information Advanced Directives 10/06/2015  Does patient have an advance directive? Yes  Type of Paramedic of [place];Out of facility DNR (pink MOST or yellow form)  Copy of advanced directive(s) in chart? Yes  Pre-existing out of facility DNR order (yellow form or pink MOST form) Yellow form placed in chart (order not valid for inpatient use)   Chief Complaint  Patient presents with  . Annual Exam    Wellness exam  . Medical Management of Chronic Issues    A-Fib, blood pressure, cholesterol  . Arthritis    in right hand  . MMSE    29/30 passed clock drawing    HPI: Patient is a 82 y.o. female seen in today for an annual wellness exam.    She has a concern about increased pain in her right thumb and right 5th finger due to arthritis.  She asks what she can use topically for this as she is unable to take nsaids with her pradaxa.    Depression screen [facility] 2/9 10/06/2015 04/07/2015 04/07/2015 10/06/2014 09/29/2013  Decreased Interest 0 0 0 0 0  Down, Depressed, Hopeless 0 0 0 0 0  PHQ - 2 Score 0 0 0 0  0    Fall Risk  10/06/2015 04/07/2015 04/07/2015 10/06/2014 09/29/2013  Falls in the past year? Yes Yes Yes No No  Number falls in past yr: 1 1 1  - -  Injury with Fall? Yes Yes Yes - -  Risk for fall due to : - History of fall(s) - - -  Follow up - Falls prevention discussed - - -   MMSE - Mini Mental State Exam 10/06/2015 10/06/2014  Orientation to time 4 5  Orientation to Place 5 5  Registration 3 3  Attention/ Calculation 5 5  Recall 3 3  Language- name 2 objects 2 2  Language- repeat 1 1  Language- follow 3 step command 3 3  Language- read & follow direction 1 1  Write a sentence 1 1  Copy design 1 1  Total score 29 30     Health Maintenance  Topic Date Due  . INFLUENZA VACCINE  03/14/2016  . TETANUS/TDAP  07/14/2025  . DEXA SCAN  Completed  . ZOSTAVAX  Completed  . PNA vac Low Risk Adult  Completed    Urinary incontinence:  Does wear a pad--may leak if not making it in time.  May not get fully seated.  No known stress incontinence.  Functional Status Survey: Is the patient deaf or have difficulty hearing?: No Does the patient have difficulty seeing, even when wearing glasses/contacts?: No (glasses) Does the patient have difficulty concentrating, remembering, or making decisions?: No Does the patient  have difficulty walking or climbing stairs?: No Does the patient have difficulty dressing or bathing?: No Does the patient have difficulty doing errands alone such as visiting a doctor's office or shopping?: No Exercise?  Just started ACEs program and water aerobics back.  Walks a fair amount-from garden home over here. Diet?  No special diet, but does avoid high starch foods, mostly chicken or fish, a little ice cream as a treat No exam data present  Had eye exam yesterday--retinal exam--has dry macular degeneration.   Dentition:  No dental problems.  Chewing and swallowing well.  Mostly fillings and crowns over years. Pain:  In right thumb and pinky--arthritis.  No residual  pain in leg.    Past Medical History  Diagnosis Date  . Atrial tachycardia ([place])     s/p ablation 2008. [facility] (R free wall Atach)  . Atrial fibrillation ([place])   . HTN (hypertension)   . Hyperlipidemia   . Personality change due to conditions classified elsewhere   . Restless legs syndrome (RLS)   . Unspecified hereditary and idiopathic peripheral neuropathy   . Left bundle branch hemiblock   . Thumb pain 11/20/2012  . Rectocele 09/23/2012  . Atrophy of vulva 09/23/2012  . Rosacea 09/23/2012  . Actinic keratosis 09/23/2012  . Other malaise and fatigue 03/05/2012  . Loss of weight 03/05/2012  . Dysphagia, unspecified(787.20) 01/17/2012  . Blepharochalasis 03/01/2011  . Sebaceous cyst 03/01/2011  . Arthralgia of temporomandibular joint 12/30/2009  . Cardiac dysrhythmia, unspecified 11/10/2009  . Myalgia and myositis, unspecified 01/05/2009  . External hemorrhoids without mention of complication 123XX123  . 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 Dermatophytosis of nail 02/28/2008  . Internal hemorrhoids without mention of complication 99991111  . Pain in joint, site unspecified 02/28/2008  . Dizziness and giddiness 04/03/2007  . Orthostatic hypotension 03/22/2007  . Edema 11/21/2006  . Palpitations 11/21/2006  . Insomnia, unspecified 11/18/2004    Past Surgical History  Procedure Laterality Date  . Catheter ablation  2008    for atrial tachycardia  . Total abdominal hysterectomy  2001    uterine bleeding  . Cyst excision  1998    mucus cyst removal from finger  . Bladder suspension  1998  . Cataract extraction w/ intraocular lens implant Right 4/8//2008    Dr. Charise Killian   Social History   Social History  . Marital Status: Married    Spouse Name: N/A  . Number of Children: N/A  . Years of Education: N/A   Occupational History  . Retired Pharmacist, hospital    Social History Main Topics  . Smoking status: Never Smoker   . Smokeless tobacco: Never Used     Comment: tobacco  - none  . Alcohol Use:  Yes     Comment: 1-2 glasses of wine/gin daily   . Drug Use: No  . Sexual Activity: No   Other Topics Concern  . 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 Erythromycin Nausea And Vomiting  . Flecainide Acetate     Postural hypotension  . Penicillins   . Simvastatin     Pains in arms and legs      Medication List       This  list is accurate as of: 10/06/15 10:51 AM.  Always use your most recent med list.               cholecalciferol 1000 units tablet  Commonly known as:  VITAMIN D  Take 1,000 Units by mouth daily.     dabigatran 150 MG Caps capsule  Commonly known as:  PRADAXA  Take one twice daily as anticoagulant     diltiazem 240 MG 24 hr capsule  Commonly known as:  DILACOR XR  Take 240 mg by mouth daily.     lactose free nutrition Liqd  Take 237 mLs by mouth. One twice a day     losartan 50 MG tablet  Commonly known as:  COZAAR  Take 50 mg by mouth daily.     metoprolol succinate 50 MG 24 hr tablet  Commonly known as:  TOPROL-XL  Take 50 mg by mouth. Take one tablet twice daily for heart rhythm     multivitamin tablet  Take 1 tablet by mouth daily.     PRESERVISION AREDS PO  Take 1 tablet by mouth daily.     simvastatin 40 MG tablet  Commonly known as:  ZOCOR  Take one tablet by mouth once daily to lower cholesterol     Turmeric Curcumin Caps  Take 1 capsule by mouth daily.     vitamin C 500 MG tablet  Commonly known as:  ASCORBIC ACID  Take 500 mg by mouth daily.         Review of Systems:  Review of Systems  Constitutional: Negative for fever, activity change, appetite change and unexpected weight change.  HENT: Negative for sinus pressure and sore throat.   Eyes: Negative for pain and visual disturbance.    Respiratory: Negative for cough, chest tightness and shortness of breath.   Cardiovascular: Positive for leg swelling. Negative for chest pain and palpitations.  Gastrointestinal: Negative for nausea, vomiting, abdominal pain, constipation and blood in stool.  Genitourinary: Positive for urgency. Negative for dysuria, frequency and difficulty urinating.  Musculoskeletal: Positive for joint swelling and arthralgias.  Skin:       Left leg wounds have healed  Neurological: Positive for numbness. Negative for dizziness and weakness.       And burning pain in left foot since injury  Hematological: Negative for adenopathy. Bruises/bleeds easily.  Psychiatric/Behavioral: Negative for confusion and sleep disturbance.    Physical Exam: Filed Vitals:   10/06/15 1011  BP: 100/62  Pulse: 60  Temp: 98.2 F (36.8 C)  TempSrc: Oral  Height: 5\' 4"  (1.626 m)  Weight: 150 lb (68.04 kg)  SpO2: 97%   Body mass index is 25.73 kg/(m^2). Physical Exam  Constitutional: She is oriented to person, place, and time. She appears well-developed and well-nourished. No distress.  HENT:  Head: Normocephalic and atraumatic.  Right Ear: External ear normal.  Left Ear: External ear normal.  Nose: Nose normal.  Mouth/Throat: Oropharynx is clear and moist. No oropharyngeal exudate.  Eyes: Conjunctivae and EOM are normal. Pupils are equal, round, and reactive to light. No scleral icterus.  glasses  Neck: Normal range of motion. Neck supple. No JVD present. No tracheal deviation present. No thyromegaly present.  Cardiovascular: Intact distal pulses.   irreg irreg  Pulmonary/Chest: Effort normal and breath sounds normal. She has no rales. Right breast exhibits no inverted nipple, no mass, no nipple discharge, no skin change and no tenderness. Left breast exhibits no inverted nipple, no mass, no nipple discharge, no skin  change and no tenderness. Breasts are symmetrical.  Abdominal: Soft. Bowel sounds are normal.  She exhibits no distension. There is no tenderness.  Musculoskeletal: Normal range of motion. She exhibits tenderness.  Right thumb base and right 5th digit; heberden's and bouchard's nodes present on both hands, but more pronounced on right  Lymphadenopathy:    She has no cervical adenopathy.    She has no axillary adenopathy.       Right axillary: No pectoral adenopathy present.       Left axillary: No pectoral adenopathy present. Neurological: She is alert and oriented to person, place, and time. She has normal reflexes. No cranial nerve deficit. She exhibits normal muscle tone. Coordination normal.  Skin: Skin is warm and dry.  Scars from left leg lacerations; chronic venous stasis changes/hyperpigmentation  Psychiatric: She has a normal mood and affect. Her behavior is normal. Judgment and thought content normal.    Labs reviewed: Basic Metabolic Panel:  Recent Labs  03/30/15 07/19/15 09/28/15  NA 138 135* 141  K 4.1 3.9 4.4  BUN 15 14 19   CREATININE 0.8 1.0 0.8   Liver Function Tests:  Recent Labs  03/30/15 07/19/15 09/28/15  AST 20 19 21   ALT 10 10 11   ALKPHOS 63 60 65   No results for input(s): LIPASE, AMYLASE in the last 8760 hours. No results for input(s): AMMONIA in the last 8760 hours. CBC:  Recent Labs  07/19/15 07/26/15 09/28/15  WBC 6.6 6.2 5.0  HGB 10.9* 10.7* 14.2  HCT 33* 32* 44  PLT 265 333 254   Lipid Panel:  Recent Labs  09/28/15  CHOL 152  HDL 44  LDLCALC 95  TRIG 90   No results found for: HGBA1C  Assessment/Plan 1. Medicare annual wellness visit, subsequent -see hpi, she is up to date  2. Primary osteoarthritis of right hand - requests a topical agent given her pradaxa limits her options - diclofenac sodium (VOLTAREN) 1 % GEL; Apply 2 g topically 4 (four) times daily.  Dispense: 100 g; Refill: 5  3. Paroxysmal atrial fibrillation (HCC) -stable now, cont pradaxa, toprol xl, diltiazem, pulse is 60  4. Skin tear of left lower leg  without complication, sequela -has healed, has some residual tingling/neuropathic-sounding pain in left foot and tenderness of ankle area and foot  5. Essential hypertension -bp controlled with cozaar, toprolxl, diltiazem  6. HLD (hyperlipidemia) -lipids at goal with statin therapy (less than 100) as no known CAD or DMII  7. Chronic venous insufficiency -cont to elevate feet at rest, does not use compression hose but advised that she could if desired to help with pain with increased standing or walking  8. Urge incontinence of urine -has opted to avoid medications--cont to use pad  Labs/tests ordered:  Cbc, cmp, flp Next appt: 6 mos med mgt  Maxum Cassarino L. Curly Mackowski, D.O. [facility] [address], [place] [address] Cell Phone (Mon-Fri 8am-5pm):  [phone] On Call:  [phone] & follow prompts after 5pm & weekends Office Phone:  [phone] Office Fax:  [phone]

## 2015-10-19 ENCOUNTER — Encounter (HOSPITAL_BASED_OUTPATIENT_CLINIC_OR_DEPARTMENT_OTHER): Payer: Medicare Other | Attending: Surgery

## 2015-10-19 DIAGNOSIS — I10 Essential (primary) hypertension: Secondary | ICD-10-CM | POA: Insufficient documentation

## 2015-10-19 DIAGNOSIS — L723 Sebaceous cyst: Secondary | ICD-10-CM | POA: Insufficient documentation

## 2015-10-19 DIAGNOSIS — I482 Chronic atrial fibrillation: Secondary | ICD-10-CM | POA: Insufficient documentation

## 2015-10-19 DIAGNOSIS — I872 Venous insufficiency (chronic) (peripheral): Secondary | ICD-10-CM | POA: Insufficient documentation

## 2015-10-19 DIAGNOSIS — G629 Polyneuropathy, unspecified: Secondary | ICD-10-CM | POA: Insufficient documentation

## 2015-10-19 DIAGNOSIS — L97829 Non-pressure chronic ulcer of other part of left lower leg with unspecified severity: Secondary | ICD-10-CM | POA: Diagnosis not present

## 2015-10-19 DIAGNOSIS — M1990 Unspecified osteoarthritis, unspecified site: Secondary | ICD-10-CM | POA: Diagnosis not present

## 2015-10-26 DIAGNOSIS — L97829 Non-pressure chronic ulcer of other part of left lower leg with unspecified severity: Secondary | ICD-10-CM | POA: Diagnosis not present

## 2015-11-02 DIAGNOSIS — L97829 Non-pressure chronic ulcer of other part of left lower leg with unspecified severity: Secondary | ICD-10-CM | POA: Diagnosis not present

## 2015-11-09 ENCOUNTER — Other Ambulatory Visit: Payer: Self-pay | Admitting: Certified Registered Nurse Anesthetist

## 2015-11-09 DIAGNOSIS — L97829 Non-pressure chronic ulcer of other part of left lower leg with unspecified severity: Secondary | ICD-10-CM | POA: Diagnosis not present

## 2015-11-16 ENCOUNTER — Encounter (HOSPITAL_BASED_OUTPATIENT_CLINIC_OR_DEPARTMENT_OTHER): Payer: Medicare Other | Attending: Surgery

## 2015-11-16 DIAGNOSIS — L858 Other specified epidermal thickening: Secondary | ICD-10-CM | POA: Insufficient documentation

## 2015-11-16 DIAGNOSIS — L723 Sebaceous cyst: Secondary | ICD-10-CM | POA: Insufficient documentation

## 2015-11-16 DIAGNOSIS — I10 Essential (primary) hypertension: Secondary | ICD-10-CM | POA: Insufficient documentation

## 2015-11-16 DIAGNOSIS — I872 Venous insufficiency (chronic) (peripheral): Secondary | ICD-10-CM | POA: Diagnosis not present

## 2015-11-16 DIAGNOSIS — M1990 Unspecified osteoarthritis, unspecified site: Secondary | ICD-10-CM | POA: Diagnosis not present

## 2015-12-17 ENCOUNTER — Other Ambulatory Visit: Payer: Self-pay | Admitting: *Deleted

## 2015-12-17 MED ORDER — SIMVASTATIN 40 MG PO TABS: ORAL_TABLET | ORAL | Status: DC

## 2015-12-17 NOTE — Telephone Encounter (Signed)
[facility] 

## 2015-12-30 ENCOUNTER — Ambulatory Visit (INDEPENDENT_AMBULATORY_CARE_PROVIDER_SITE_OTHER): Payer: Medicare Other | Admitting: Internal Medicine

## 2015-12-30 ENCOUNTER — Encounter: Payer: Self-pay | Admitting: Internal Medicine

## 2015-12-30 VITALS — BP 128/80 | HR 82 | Temp 98.2°F | Ht 64.0 in | Wt 154.0 lb

## 2015-12-30 DIAGNOSIS — M109 Gout, unspecified: Secondary | ICD-10-CM | POA: Diagnosis not present

## 2015-12-30 DIAGNOSIS — C44729 Squamous cell carcinoma of skin of left lower limb, including hip: Secondary | ICD-10-CM

## 2015-12-30 DIAGNOSIS — C44721 Squamous cell carcinoma of skin of unspecified lower limb, including hip: Secondary | ICD-10-CM | POA: Insufficient documentation

## 2015-12-30 NOTE — Progress Notes (Signed)
Location:  [facility] clinic Provider: Bader Stubblefield L. Mariea Clonts, D.O., C.M.D.  Code Status: DNR Goals of Care:  Advanced Directives 10/06/2015  Does patient have an advance directive? Yes  Type of Paramedic of [place];Out of facility DNR (pink MOST or yellow form)  Copy of advanced directive(s) in chart? Yes  Pre-existing out of facility DNR order (yellow form or pink MOST form) Yellow form placed in chart (order not valid for inpatient use)     Chief Complaint  Patient presents with  . Acute Visit    gout or arthritis in both hands    HPI: Patient is a 82 y.o. female seen today for an acute visit for a flare of gout arthritis as suspected by her cardiologist.  Has been applying voltaren gel for it for a week.  Now gradually getting better now.  She admits to adding a glass of red to her glass of white wine she drinks with her husband.  Perhaps that brought it on she thinks.    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  Still wearing dressing.  still not able to get back to water aerobics until that heals.  She misses that.  Past Medical History  Diagnosis Date  . Atrial tachycardia ([place])     s/p ablation 2008. [facility] (R free wall Atach)  . Atrial fibrillation ([place])   . HTN (hypertension)   . Hyperlipidemia   . Personality change due to conditions classified elsewhere   . Restless legs syndrome (RLS)   . Unspecified hereditary and idiopathic peripheral neuropathy   . Left bundle branch hemiblock   . Thumb pain 11/20/2012  . Rectocele 09/23/2012  . Atrophy of vulva 09/23/2012  . Rosacea 09/23/2012  . Actinic keratosis 09/23/2012  . Other malaise and fatigue 03/05/2012  . Loss of weight 03/05/2012  . Dysphagia, unspecified(787.20) 01/17/2012  . Blepharochalasis 03/01/2011  . Sebaceous cyst 03/01/2011  . Arthralgia of temporomandibular joint 12/30/2009  . Cardiac dysrhythmia, unspecified 11/10/2009  . Myalgia and myositis, unspecified 01/05/2009  . External hemorrhoids without  mention of complication 123XX123  . 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 Dermatophytosis of nail 02/28/2008  . Internal hemorrhoids without mention of complication 99991111  . Pain in joint, site unspecified 02/28/2008  . Dizziness and giddiness 04/03/2007  . Orthostatic hypotension 03/22/2007  . Edema 11/21/2006  . Palpitations 11/21/2006  . Insomnia, unspecified 11/18/2004    Past Surgical History  Procedure Laterality Date  . Catheter ablation  2008    for atrial tachycardia  . Total abdominal hysterectomy  2001    uterine bleeding  . Cyst excision  1998    mucus cyst removal from finger  . Bladder suspension  1998  . Cataract extraction w/ intraocular lens implant Right 4/8//2008    Dr. Charise Killian    Allergies  Allergen Reactions  . Erythromycin Nausea And Vomiting  . Flecainide Acetate     Postural hypotension  . Penicillins   . Simvastatin     Pains in arms and legs      Medication List       This list is accurate as of: 12/30/15  3:36 PM.  Always use your most recent med list.               cholecalciferol 1000 units tablet  Commonly known as:  VITAMIN D  Take 1,000 Units by mouth daily.     diclofenac sodium 1 % Gel  Commonly known as:  VOLTAREN  Apply  2 g topically 4 (four) times daily.     diltiazem 240 MG 24 hr capsule  Commonly known as:  DILACOR XR  Take 240 mg by mouth daily.     losartan 50 MG tablet  Commonly known as:  COZAAR  Take 50 mg by mouth daily.     metoprolol succinate 50 MG 24 hr tablet  Commonly known as:  TOPROL-XL  Take 50 mg by mouth. Take one tablet twice daily for heart rhythm     PRESERVISION AREDS PO  Take 1 tablet by mouth daily.     simvastatin 40 MG tablet  Commonly known as:  ZOCOR  Take one tablet by mouth once daily to lower cholesterol     Turmeric Curcumin Caps  Take 1 capsule by mouth daily.     vitamin C 500 MG tablet  Commonly known as:  ASCORBIC ACID  Take 500 mg by mouth daily.     XARELTO 20 MG Tabs  tablet  Generic drug:  rivaroxaban  take 1 tablet by mouth once daily WITH DINNER        Review of Systems:  Review of Systems  Constitutional: Negative for fever and chills.  Respiratory: Negative for shortness of breath.   Cardiovascular: Negative for chest pain and palpitations.  Musculoskeletal: Positive for joint pain. Negative for myalgias, back pain, falls and neck pain.       Bilateral hands with inflammation  Skin:       S/p mohs for squamous cell ca on left leg    Health Maintenance  Topic Date Due  . INFLUENZA VACCINE  03/14/2016  . TETANUS/TDAP  07/14/2025  . DEXA SCAN  Completed  . ZOSTAVAX  Completed  . PNA vac Low Risk Adult  Completed    Physical Exam: Filed Vitals:   12/30/15 1518  BP: 128/80  Pulse: 82  Temp: 98.2 F (36.8 C)  TempSrc: Oral  Height: 5\' 4"  (1.626 m)  Weight: 154 lb (69.854 kg)  SpO2: 97%   Body mass index is 26.42 kg/(m^2). Physical Exam  Musculoskeletal: She exhibits tenderness.  Bilateral hands at MCPs swollen, mildly erythematous and tender    Labs reviewed: Basic Metabolic Panel:  Recent Labs  03/30/15 07/19/15 09/28/15  NA 138 135* 141  K 4.1 3.9 4.4  BUN 15 14 19   CREATININE 0.8 1.0 0.8   Liver Function Tests:  Recent Labs  03/30/15 07/19/15 09/28/15  AST 20 19 21   ALT 10 10 11   ALKPHOS 63 60 65   No results for input(s): LIPASE, AMYLASE in the last 8760 hours. No results for input(s): AMMONIA in the last 8760 hours. CBC:  Recent Labs  07/19/15 07/26/15 09/28/15  WBC 6.6 6.2 5.0  HGB 10.9* 10.7* 14.2  HCT 33* 32* 44  PLT 265 333 254   Lipid Panel:  Recent Labs  09/28/15  CHOL 152  HDL 44  LDLCALC 95  TRIG 90   Assessment/Plan 1. Acute gouty arthritis - possibly vs. Other inflammatory arthropathy - does correlate with increase in red wine - will check uric acid level to assess whether she has elevated levels - Uric Acid -provided dietary information to help prevent flares -she does not  want ongoing meds and this flare is nearly resolved by the time she saw me -advised ice and rest these joints and let me know if not getting better -also uses her voltaren if needed for arthritic joints  2. Squamous cell carcinoma, leg, left -s/p mohs, healing well  but waiting for clearance to return to her water aerobics   Labs/tests ordered:   Orders Placed This Encounter  Procedures  . Uric Acid   Next appt:  04/05/2016  Marten Iles L. Ligaya Cormier, D.O. [facility] [address], [place] [address] Cell Phone (Mon-Fri 8am-5pm):  [phone] On Call:  [phone] & follow prompts after 5pm & weekends Office Phone:  [phone] Office Fax:  [phone]

## 2015-12-31 ENCOUNTER — Telehealth: Payer: Self-pay | Admitting: *Deleted

## 2015-12-31 ENCOUNTER — Encounter: Payer: Self-pay | Admitting: *Deleted

## 2015-12-31 LAB — URIC ACID: Uric Acid: 5.8 mg/dL (ref 2.5–7.1)

## 2015-12-31 NOTE — Telephone Encounter (Signed)
Patient called and left message confused about how to take her Metoprolol. She thought the bottle stated 3 times daily but she has only been taking it twice daily. Per Medication list she is only to be taking twice daily. I called patient back, she stated I could leave message, left message to confirm that it was only twice daily.

## 2016-03-01 ENCOUNTER — Other Ambulatory Visit: Payer: Self-pay | Admitting: *Deleted

## 2016-03-01 MED ORDER — METOPROLOL SUCCINATE ER 50 MG PO TB24: ORAL_TABLET | ORAL | Status: DC

## 2016-03-01 NOTE — Telephone Encounter (Signed)
[facility] 

## 2016-04-05 ENCOUNTER — Encounter: Payer: Self-pay | Admitting: Internal Medicine

## 2016-04-05 ENCOUNTER — Non-Acute Institutional Stay: Payer: Medicare Other | Admitting: Internal Medicine

## 2016-04-05 VITALS — BP 112/70 | HR 106 | Temp 97.8°F | Ht 64.0 in | Wt 152.0 lb

## 2016-04-05 DIAGNOSIS — I480 Paroxysmal atrial fibrillation: Secondary | ICD-10-CM | POA: Diagnosis not present

## 2016-04-05 DIAGNOSIS — Z9181 History of falling: Secondary | ICD-10-CM

## 2016-04-05 DIAGNOSIS — M19041 Primary osteoarthritis, right hand: Secondary | ICD-10-CM | POA: Diagnosis not present

## 2016-04-05 DIAGNOSIS — I10 Essential (primary) hypertension: Secondary | ICD-10-CM

## 2016-04-05 DIAGNOSIS — Z780 Asymptomatic menopausal state: Secondary | ICD-10-CM

## 2016-04-05 DIAGNOSIS — E2839 Other primary ovarian failure: Secondary | ICD-10-CM

## 2016-04-05 NOTE — Progress Notes (Signed)
Location:   [place] of Service:  Clinic (12)  Provider: Jerzee Jerome L. Mariea Clonts, D.O., C.M.D.  Code Status: DNR Goals of Care:  Advanced Directives 04/05/2016  Does patient have an advance directive? Yes  Type of Advance Directive Out of facility DNR (pink MOST or yellow form);Healthcare Power of Attorney  Does patient want to make changes to advanced directive? -  Copy of advanced directive(s) in chart? Yes  Pre-existing out of facility DNR order (yellow form or pink MOST form) Yellow form placed in chart (order not valid for inpatient use)   Chief Complaint  Patient presents with  . Medical Management of Chronic Issues    6 mth follow-up   HPI: Patient is a 82 y.o. female seen today for medical management of chronic diseases.    Has had a rash under her left breast--it's improving and has not used anything.    Has regularly been using the voltaren gel for her joints and does think it's helping her right pinky finger.  Better within 5-10 mins.    Afib doing fine, she reports.  Says if she rushes and sometimes when she puts her head on her pillow it is racing.     Says she plans to get her flu shot at [facility] on [address].    Left leg looks much better.    Has not had a bone density since 2011.  Had at [facility] when it was at [place].  Past Medical History:  Diagnosis Date  . Actinic keratosis 09/23/2012  . 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 Arthralgia of temporomandibular joint 12/30/2009  . Atrial fibrillation ([place])   . Atrial tachycardia ([place])    s/p ablation 2008. [facility] (R free wall Atach)  . Atrophy of vulva 09/23/2012  . Blepharochalasis 03/01/2011  . Cardiac dysrhythmia, unspecified 11/10/2009  . Dermatophytosis of nail 02/28/2008  . Dizziness and giddiness 04/03/2007  . Dysphagia, unspecified(787.20) 01/17/2012  . Edema 11/21/2006  . External hemorrhoids without mention of complication 123XX123  . HTN (hypertension)   . Hyperlipidemia   .  Insomnia, unspecified 11/18/2004  . Internal hemorrhoids without mention of complication 99991111  . Left bundle branch hemiblock   . Loss of weight 03/05/2012  . Myalgia and myositis, unspecified 01/05/2009  . Orthostatic hypotension 03/22/2007  . Other malaise and fatigue 03/05/2012  . Pain in joint, site unspecified 02/28/2008  . Palpitations 11/21/2006  . Personality change due to conditions classified elsewhere   . Rectocele 09/23/2012  . Restless legs syndrome (RLS)   . Rosacea 09/23/2012  . Sebaceous cyst 03/01/2011  . Thumb pain 11/20/2012  . Unspecified hereditary and idiopathic peripheral neuropathy     Past Surgical History:  Procedure Laterality Date  . BLADDER SUSPENSION  1998  . CATARACT EXTRACTION W/ INTRAOCULAR LENS IMPLANT Right 4/8//2008   Dr. Charise Killian  . catheter ablation  2008   for atrial tachycardia  . CYST EXCISION  1998   mucus cyst removal from finger  . TOTAL ABDOMINAL HYSTERECTOMY  2001   uterine bleeding    Allergies  Allergen Reactions  . Erythromycin Nausea And Vomiting  . Flecainide Acetate     Postural hypotension  . Penicillins   . Simvastatin     Pains in arms and legs      Medication List       Accurate as of 04/05/16  9:55 AM. Always use your most recent med list.  cholecalciferol 1000 units tablet Commonly known as:  VITAMIN D Take 1,000 Units by mouth daily.   diclofenac sodium 1 % Gel Commonly known as:  VOLTAREN Apply 2 g topically 4 (four) times daily.   diltiazem 240 MG 24 hr capsule Commonly known as:  DILACOR XR Take 240 mg by mouth daily.   losartan 50 MG tablet Commonly known as:  COZAAR Take 50 mg by mouth daily.   metoprolol succinate 50 MG 24 hr tablet Commonly known as:  TOPROL-XL Take one tablet twice daily for heart rhythm   PRESERVISION AREDS PO Take 1 tablet by mouth daily.   simvastatin 40 MG tablet Commonly known as:  ZOCOR Take one tablet by mouth once daily to lower cholesterol   Turmeric  Curcumin Caps Take 1 capsule by mouth daily.   vitamin C 500 MG tablet Commonly known as:  ASCORBIC ACID Take 500 mg by mouth daily.   XARELTO 20 MG Tabs tablet Generic drug:  rivaroxaban take 1 tablet by mouth once daily WITH DINNER       Review of Systems:  Review of Systems  Constitutional: Negative for chills, fever and malaise/fatigue.  HENT: Positive for hearing loss. Negative for congestion.   Eyes: Negative for blurred vision.       Glasses; going to have eyelids repaired  Respiratory: Negative for cough and shortness of breath.   Cardiovascular: Negative for chest pain, palpitations and leg swelling.  Gastrointestinal: Negative for abdominal pain and constipation.  Genitourinary: Negative for dysuria.  Musculoskeletal: Positive for joint pain. Negative for falls.       Right 5th digit  Skin: Positive for rash. Negative for itching.       Has had rash beneath left breast that has improved she reports  Neurological: Negative for dizziness, loss of consciousness, weakness and headaches.  Endo/Heme/Allergies: Bruises/bleeds easily.  Psychiatric/Behavioral: Negative for depression and memory loss. The patient does not have insomnia.     Health Maintenance  Topic Date Due  . INFLUENZA VACCINE  03/14/2016  . TETANUS/TDAP  07/14/2025  . DEXA SCAN  Completed  . ZOSTAVAX  Completed  . PNA vac Low Risk Adult  Completed    Physical Exam: Vitals:   04/05/16 0929  BP: 112/70  Pulse: (!) 106  Temp: 97.8 F (36.6 C)  TempSrc: Oral  SpO2: 98%  Weight: 152 lb (68.9 kg)  Height: 5\' 4"  (1.626 m)   Body mass index is 26.09 kg/m. Physical Exam  Constitutional: She is oriented to person, place, and time. She appears well-developed and well-nourished.  HENT:  Lid ptosis of skin on upper lids bilaterally  Cardiovascular:  irreg irreg but rate  improved by the time I examined her after resting  Pulmonary/Chest: Effort normal and breath sounds normal.  Abdominal: Bowel  sounds are normal.  Musculoskeletal: Normal range of motion. She exhibits no edema.  Neurological: She is alert and oriented to person, place, and time.  Skin: Skin is warm and dry. Capillary refill takes less than 2 seconds.  Seborrheic keratoses beneath left breast and on abdomen, no true rash visible; wound has healed on left leg, graft smooth and looks good  Psychiatric: She has a normal mood and affect. Her behavior is normal. Judgment and thought content normal.    Labs reviewed: Basic Metabolic Panel:  Recent Labs  07/19/15 09/28/15  NA 135* 141  K 3.9 4.4  BUN 14 19  CREATININE 1.0 0.8   Liver Function Tests:  Recent Labs  07/19/15  09/28/15  AST 19 21  ALT 10 11  ALKPHOS 60 65   No results for input(s): LIPASE, AMYLASE in the last 8760 hours. No results for input(s): AMMONIA in the last 8760 hours. CBC:  Recent Labs  07/19/15 07/26/15 09/28/15  WBC 6.6 6.2 5.0  HGB 10.9* 10.7* 14.2  HCT 33* 32* 44  PLT 265 333 254   Lipid Panel:  Recent Labs  09/28/15  CHOL 152  HDL 44  LDLCALC 95  TRIG 90   Assessment/Plan 1. Postmenopausal - needs f/u bone density as not done since 2011 at [facility]; Future  2. Estrogen deficiency -ongoing and needs recheck of bone density -continues weightbearing exercise with walking, exercise classes, balance class, water aerobics and healthy balanced diet, vitamin D supplement - DG Bone Density; Future  3. Paroxysmal atrial fibrillation (HCC) -continues on xarelto and metoprolol succinate and diltiazem -still notes occasional fast heart rate when she lies down at night and after exertion (was elevated upon arrival in clinic, also)  4. Primary osteoarthritis of right hand -5th digit -improved with voltaren gel which she reports is highly effective  5. Essential hypertension -bp at goal with current regimen, no dizziness  6. H/O fall -long ago on curb with leg injury -balance poor chronically, had several  tests at the dept of kinesiology at [facility] and did well on all but balance and some difficulty with flexibility, it appears (will scan) -cont balance classes, water aerobics, weightbearing exercise with walking   Labs/tests ordered:   Orders Placed This Encounter  Procedures  . DG Bone Density    Standing Status:   Future    Standing Expiration Date:   06/05/2017    Order Specific Question:   Reason for Exam (SYMPTOM  OR DIAGNOSIS REQUIRED)    Answer:   repeat bone density, not 2011    Comments:   [facility] in past    Order Specific Question:   Preferred imaging location?    Answer:   External  cbc, bmp, liver panel, fasting lipids  Next appt:  6 mos annual wellness, labs before  Chais Fehringer L. Elman Dettman, D.O. [facility] [address], [place] [address] Cell Phone (Mon-Fri 8am-5pm):  [phone] On Call:  [phone] & follow prompts after 5pm & weekends Office Phone:  [phone] Office Fax:  [phone]

## 2016-04-20 LAB — HM DEXA SCAN

## 2016-04-25 ENCOUNTER — Encounter: Payer: Self-pay | Admitting: *Deleted

## 2016-06-07 ENCOUNTER — Other Ambulatory Visit: Payer: Self-pay | Admitting: Internal Medicine

## 2016-06-12 ENCOUNTER — Telehealth: Payer: Self-pay | Admitting: *Deleted

## 2016-06-12 NOTE — Telephone Encounter (Signed)
Coricidin HBP cough and cold should be ok and not affect her blood pressure.  She should also use warm humidity like sitting in the shower/bath or a humidifier/vaporizer to moisten her nasal and throat passages.  Drink 6-8 8oz glasses of water each day.  If she is not getting better or develops fever, she should call back.

## 2016-06-12 NOTE — Telephone Encounter (Signed)
Dry hacking cough, taking Nyquil and dayquil, and Claritin, want to know what OTC can she take that want interfere with her BP medication? No fever, just dry cough, maybe post nasal drip?

## 2016-06-13 NOTE — Telephone Encounter (Signed)
Pt scheduled appt 06/14/16

## 2016-06-14 ENCOUNTER — Non-Acute Institutional Stay: Payer: Medicare Other | Admitting: Internal Medicine

## 2016-06-14 ENCOUNTER — Encounter: Payer: Self-pay | Admitting: Internal Medicine

## 2016-06-14 VITALS — BP 116/68 | HR 52 | Temp 99.1°F | Wt 153.0 lb

## 2016-06-14 DIAGNOSIS — J069 Acute upper respiratory infection, unspecified: Secondary | ICD-10-CM | POA: Diagnosis not present

## 2016-06-14 DIAGNOSIS — B9789 Other viral agents as the cause of diseases classified elsewhere: Secondary | ICD-10-CM

## 2016-06-14 NOTE — Progress Notes (Signed)
Location:  [facility] clinic Provider: Jelani Vreeland L. Mariea Clonts, D.O., C.M.D.  Code Status: DNR Goals of Care:  Advanced Directives 04/05/2016  Does patient have an advance directive? Yes  Type of Advance Directive Out of facility DNR (pink MOST or yellow form);Healthcare Power of Attorney  Does patient want to make changes to advanced directive? -  Copy of advanced directive(s) in chart? Yes  Pre-existing out of facility DNR order (yellow form or pink MOST form) Yellow form placed in chart (order not valid for inpatient use)     Chief Complaint  Patient presents with  . Acute Visit    cough x2 weeks    HPI: Patient is a 82 y.o. female seen today for an acute visit for No fever, just ongoing cough.  No head congestion.  No headache or ear pressure.  Some of that at the beginning, but no longer.  One time brought up some mucus yesterday.  If she breathes deeply or talks too much, she coughs.  Could not assess the mucus appearance.  Energy level not good.  Appetite unchanged.  Has tried the nyquil, dayquil and zyrtec instead of claritin just since yesterday per Bernadette's advice.     Past Medical History:  Diagnosis Date  . Actinic keratosis 09/23/2012  . 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 Arthralgia of temporomandibular joint 12/30/2009  . Atrial fibrillation ([place])   . Atrial tachycardia ([place])    s/p ablation 2008. [facility] (R free wall Atach)  . Atrophy of vulva 09/23/2012  . Blepharochalasis 03/01/2011  . Cardiac dysrhythmia, unspecified 11/10/2009  . Dermatophytosis of nail 02/28/2008  . Dizziness and giddiness 04/03/2007  . Dysphagia, unspecified(787.20) 01/17/2012  . Edema 11/21/2006  . External hemorrhoids without mention of complication 123XX123  . HTN (hypertension)   . Hyperlipidemia   . Insomnia, unspecified 11/18/2004  . Internal hemorrhoids without mention of complication 99991111  . Left bundle branch hemiblock   . Loss of weight 03/05/2012  . Myalgia and myositis, unspecified  01/05/2009  . Orthostatic hypotension 03/22/2007  . Other malaise and fatigue 03/05/2012  . Pain in joint, site unspecified 02/28/2008  . Palpitations 11/21/2006  . Personality change due to conditions classified elsewhere   . Rectocele 09/23/2012  . Restless legs syndrome (RLS)   . Rosacea 09/23/2012  . Sebaceous cyst 03/01/2011  . Thumb pain 11/20/2012  . Unspecified hereditary and idiopathic peripheral neuropathy     Past Surgical History:  Procedure Laterality Date  . BLADDER SUSPENSION  1998  . CATARACT EXTRACTION W/ INTRAOCULAR LENS IMPLANT Right 4/8//2008   Dr. Charise Killian  . catheter ablation  2008   for atrial tachycardia  . CYST EXCISION  1998   mucus cyst removal from finger  . TOTAL ABDOMINAL HYSTERECTOMY  2001   uterine bleeding    Allergies  Allergen Reactions  . Erythromycin Nausea And Vomiting  . Flecainide Acetate     Postural hypotension  . Penicillins   . Simvastatin     Pains in arms and legs      Medication List       Accurate as of 06/14/16  4:49 PM. Always use your most recent med list.          cholecalciferol 1000 units tablet Commonly known as:  VITAMIN D Take 1,000 Units by mouth daily.   diltiazem 240 MG 24 hr capsule Commonly known as:  DILACOR XR Take 240 mg by mouth daily.   losartan 50 MG tablet Commonly known as:  COZAAR Take  50 mg by mouth daily.   metoprolol succinate 50 MG 24 hr tablet Commonly known as:  TOPROL-XL take 1 tablet by mouth twice a day for HEART RHYTHM   PRESERVISION AREDS PO Take 1 tablet by mouth daily.   simvastatin 40 MG tablet Commonly known as:  ZOCOR Take one tablet by mouth once daily to lower cholesterol   Turmeric Curcumin Caps Take 1 capsule by mouth daily.   XARELTO 20 MG Tabs tablet Generic drug:  rivaroxaban take 1 tablet by mouth once daily WITH DINNER       Review of Systems:  Review of Systems  Constitutional: Positive for malaise/fatigue. Negative for chills and fever.  HENT: Positive for  congestion. Negative for ear discharge, ear pain and sore throat.   Eyes: Negative for discharge and redness.  Respiratory: Positive for cough and sputum production. Negative for shortness of breath.   Cardiovascular: Negative for chest pain and palpitations.  Gastrointestinal: Negative for abdominal pain and diarrhea.  Genitourinary: Negative for dysuria.  Musculoskeletal: Negative for falls and myalgias.  Skin: Negative for itching and rash.  Neurological: Negative for dizziness, weakness and headaches.  Endo/Heme/Allergies: Bruises/bleeds easily.  Psychiatric/Behavioral: Negative for memory loss.    Health Maintenance  Topic Date Due  . TETANUS/TDAP  07/14/2025  . INFLUENZA VACCINE  Completed  . DEXA SCAN  Completed  . ZOSTAVAX  Completed  . PNA vac Low Risk Adult  Completed    Physical Exam: Vitals:   06/14/16 1608  BP: 116/68  Pulse: (!) 52  Temp: 99.1 F (37.3 C)  TempSrc: Oral  SpO2: 96%  Weight: 153 lb (69.4 kg)   Body mass index is 26.26 kg/m. Physical Exam  Constitutional: She is oriented to person, place, and time. She appears well-developed and well-nourished. No distress.  HENT:  Head: Normocephalic and atraumatic.  Right Ear: External ear normal.  Left Ear: External ear normal.  Mouth/Throat: Oropharynx is clear and moist.  Some nasal congestion, redness  Eyes: Conjunctivae are normal.  Neck: Neck supple.  Cardiovascular:  irreg irreg  Pulmonary/Chest: Effort normal and breath sounds normal. No respiratory distress. She has no wheezes. She has no rales.  No rhonchi   Abdominal: Bowel sounds are normal.  Musculoskeletal: Normal range of motion.  Lymphadenopathy:    She has no cervical adenopathy.  Neurological: She is alert and oriented to person, place, and time.  Skin: Skin is warm and dry.  Psychiatric: She has a normal mood and affect.    Labs reviewed: Basic Metabolic Panel:  Recent Labs  07/19/15 09/28/15  NA 135* 141  K 3.9 4.4  BUN  14 19  CREATININE 1.0 0.8   Liver Function Tests:  Recent Labs  07/19/15 09/28/15  AST 19 21  ALT 10 11  ALKPHOS 60 65   No results for input(s): LIPASE, AMYLASE in the last 8760 hours. No results for input(s): AMMONIA in the last 8760 hours. CBC:  Recent Labs  07/19/15 07/26/15 09/28/15  WBC 6.6 6.2 5.0  HGB 10.9* 10.7* 14.2  HCT 33* 32* 44  PLT 265 333 254   Lipid Panel:  Recent Labs  09/28/15  CHOL 152  HDL 44  LDLCALC 95  TRIG 90    Assessment/Plan 1. Viral upper respiratory tract infection with cough -reviewed conservative management for her URI and advised to notify me if she develops a fever, cough productive or thick yellow or green sputum or not getting better with hydration, rest, vit c and cough medicine  Labs/tests ordered:  No orders of the defined types were placed in this encounter.   Next appt:  10/04/2016  Aileena Iglesia L. Theta Leaf, D.O. [facility] [address], [place] [address] Cell Phone (Mon-Fri 8am-5pm):  [phone] On Call:  [phone] & follow prompts after 5pm & weekends Office Phone:  [phone] Office Fax:  [phone]

## 2016-07-13 ENCOUNTER — Other Ambulatory Visit: Payer: Self-pay | Admitting: *Deleted

## 2016-07-13 MED ORDER — MUPIROCIN 2 % EX OINT: 1.0000 "application " | TOPICAL_OINTMENT | Freq: Every day | CUTANEOUS | 0 refills | Status: DC

## 2016-07-17 ENCOUNTER — Encounter: Payer: Self-pay | Admitting: Internal Medicine

## 2016-08-29 ENCOUNTER — Other Ambulatory Visit: Payer: Self-pay | Admitting: Internal Medicine

## 2016-09-26 ENCOUNTER — Encounter: Payer: Self-pay | Admitting: Internal Medicine

## 2016-09-26 LAB — LIPID PANEL
Cholesterol: 153 mg/dL (ref 0–200)
HDL: 43 mg/dL (ref 35–70)
LDL Cholesterol: 90 mg/dL
Triglycerides: 100 mg/dL (ref 40–160)

## 2016-09-26 LAB — CBC AND DIFFERENTIAL
HCT: 43 % (ref 36–46)
Hemoglobin: 14.1 g/dL (ref 12.0–16.0)
Platelets: 211 10*3/uL (ref 150–399)
WBC: 5.2 10^3/mL

## 2016-09-26 LAB — HEPATIC FUNCTION PANEL
ALT: 13 U/L (ref 7–35)
AST: 26 U/L (ref 13–35)
Alkaline Phosphatase: 84 U/L (ref 25–125)
Bilirubin, Total: 0.5 mg/dL

## 2016-09-26 LAB — BASIC METABOLIC PANEL
BUN: 16 mg/dL (ref 4–21)
Creatinine: 0.7 mg/dL (ref 0.5–1.1)
Glucose: 96 mg/dL
Potassium: 4.1 mmol/L (ref 3.4–5.3)
Sodium: 140 mmol/L (ref 137–147)

## 2016-09-29 ENCOUNTER — Encounter: Payer: Self-pay | Admitting: Internal Medicine

## 2016-10-04 ENCOUNTER — Non-Acute Institutional Stay: Payer: Medicare Other | Admitting: Internal Medicine

## 2016-10-04 ENCOUNTER — Encounter: Payer: Self-pay | Admitting: Internal Medicine

## 2016-10-04 VITALS — BP 120/70 | HR 104 | Temp 98.9°F | Ht 64.0 in | Wt 148.0 lb

## 2016-10-04 DIAGNOSIS — I10 Essential (primary) hypertension: Secondary | ICD-10-CM

## 2016-10-04 DIAGNOSIS — Z0000 Encounter for general adult medical examination without abnormal findings: Secondary | ICD-10-CM | POA: Diagnosis not present

## 2016-10-04 DIAGNOSIS — J069 Acute upper respiratory infection, unspecified: Secondary | ICD-10-CM | POA: Diagnosis not present

## 2016-10-04 DIAGNOSIS — B9789 Other viral agents as the cause of diseases classified elsewhere: Secondary | ICD-10-CM

## 2016-10-04 DIAGNOSIS — I480 Paroxysmal atrial fibrillation: Secondary | ICD-10-CM | POA: Diagnosis not present

## 2016-10-04 MED ORDER — HYDROCOD POLST-CPM POLST ER 10-8 MG/5ML PO SUER: 5.0000 mL | Freq: Two times a day (BID) | ORAL | 0 refills | Status: DC | PRN

## 2016-10-04 NOTE — Progress Notes (Signed)
Location:  Occupational psychologist of Service:  Clinic (12) Provider: Shalandria Elsbernd L. Mariea Clonts, D.O., C.M.D.  Patient Care Team: Gayland Curry, DO as PCP - General (Geriatric Medicine) Well Perry Jeri Cos, NP as Nurse Practitioner (Geriatric Medicine) Adrian Prows, MD as Consulting Physician (Cardiology) Earlean Polka, MD as Consulting Physician (Ophthalmology)  Extended Emergency Contact Information Primary Emergency Contact: [name] of [place] Phone: [phone] Work Phone: [phone] Relation: Son Secondary Emergency Contact: Seese,[name] Address: [place], [place] Phone: [phone] Relation: Spouse  Code Status: DNR Goals of Care: Advanced Directive information Advanced Directives 04/05/2016  Does Patient Have a Medical Advance Directive? Yes  Type of Advance Directive Out of facility DNR (pink MOST or yellow form);Healthcare Power of Attorney  Does patient want to make changes to medical advance directive? -  Copy of [place] in Chart? Yes  Pre-existing out of facility DNR order (yellow form or pink MOST form) Yellow form placed in chart (order not valid for inpatient use)   Chief Complaint  Patient presents with  . Annual Exam    wellness exam, cough x3 weeks  . MMSE    30/30 passed clock    HPI: Patient is a 83 y.o. female seen in today for an annual wellness exam.    Heart rate is elevated at 104.  She's worrying about her husband and taxes.  Did come down during exam.  She continues to have a cough from an upper respiratory infection she's had for 3 weeks.  Sometimes gets very deep, but bringing up sputum, but not enough to spit it out.  Tried otc cough syrup, but no success.  Has had some wheezing.  Has not had any fever.    Depression screen [facility] 2/9 10/04/2016 10/06/2015 04/07/2015 04/07/2015 10/06/2014  Decreased Interest  0 0 0 0 0  Down, Depressed, Hopeless 0 0 0 0 0  PHQ - 2 Score 0 0 0 0 0    Fall Risk  10/04/2016 10/06/2015 04/07/2015 04/07/2015 10/06/2014  Falls in the past year? No Yes Yes Yes No  Number falls in past yr: - 1 1 1  -  Injury with Fall? - Yes Yes Yes -  Risk for fall due to : - - History of fall(s) - -  Follow up - - Falls prevention discussed - -   MMSE - Mini Mental State Exam 10/04/2016 10/06/2015 10/06/2014  Orientation to time 5 4 5   Orientation to Place 5 5 5   Registration 3 3 3   Attention/ Calculation 5 5 5   Recall 3 3 3   Language- name 2 objects 2 2 2   Language- repeat 1 1 1   Language- follow 3 step command 3 3 3   Language- read & follow direction 1 1 1   Write a sentence 1 1 1   Copy design 1 1 1   Total score 30 29 30   passed clock  Health Maintenance  Topic Date Due  . TETANUS/TDAP  07/14/2025  . INFLUENZA VACCINE  Completed  . DEXA SCAN  Completed  . PNA vac Low Risk Adult  Completed    Functional Status Survey: Is the patient deaf or have difficulty hearing?: No Does the patient have difficulty seeing, even when wearing glasses/contacts?: Yes (hard to read with macular) Does the patient have difficulty concentrating, remembering, or making decisions?: No Does the patient have difficulty  walking or climbing stairs?: No Does the patient have difficulty dressing or bathing?: No Does the patient have difficulty doing errands alone such as visiting a doctor's office or shopping?: NoCurrent Exercise Habits: Structured exercise class, Type of exercise: Other - see comments (swimming except past three weeks with cough, also other clas), Time (Minutes): 45, Frequency (Times/Week): 5, Weekly Exercise (Minutes/Week): 225, Intensity: Moderate Exercise limited by: None identified Diet?  Regular [facility] diet Vision Screening Comments: Dr. Ellie Lunch Jan 2018 Hearing:  No problem Dentition:  No issues Pain:  No pain  Past Medical History:  Diagnosis Date  . Actinic keratosis  09/23/2012  . 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 Arthralgia of temporomandibular joint 12/30/2009  . Atrial fibrillation ([place])   . Atrial tachycardia ([place])    s/p ablation 2008. [facility] (R free wall Atach)  . Atrophy of vulva 09/23/2012  . Blepharochalasis 03/01/2011  . Cardiac dysrhythmia, unspecified 11/10/2009  . Dermatophytosis of nail 02/28/2008  . Dizziness and giddiness 04/03/2007  . Dysphagia, unspecified(787.20) 01/17/2012  . Edema 11/21/2006  . External hemorrhoids without mention of complication 123XX123  . HTN (hypertension)   . Hyperlipidemia   . Insomnia, unspecified 11/18/2004  . Internal hemorrhoids without mention of complication 99991111  . Left bundle branch hemiblock   . Loss of weight 03/05/2012  . Myalgia and myositis, unspecified 01/05/2009  . Orthostatic hypotension 03/22/2007  . Other malaise and fatigue 03/05/2012  . Pain in joint, site unspecified 02/28/2008  . Palpitations 11/21/2006  . Personality change due to conditions classified elsewhere   . Rectocele 09/23/2012  . Restless legs syndrome (RLS)   . Rosacea 09/23/2012  . Sebaceous cyst 03/01/2011  . Thumb pain 11/20/2012  . Unspecified hereditary and idiopathic peripheral neuropathy     Past Surgical History:  Procedure Laterality Date  . BLADDER SUSPENSION  1998  . CATARACT EXTRACTION W/ INTRAOCULAR LENS IMPLANT Right 4/8//2008   Dr. Charise Killian  . catheter ablation  2008   for atrial tachycardia  . CYST EXCISION  1998   mucus cyst removal from finger  . TOTAL ABDOMINAL HYSTERECTOMY  2001   uterine bleeding    Family History  Problem Relation Age of Onset  . Heart disease Mother   . Heart disease Father   . Cancer Sister     breast    Social History   Social History  . Marital status: Married    Spouse name: N/A  . Number of children: N/A  . Years of education: N/A   Occupational History  . Retired Building services engineer [employer] Topics  . Smoking status: Never Smoker  .  Smokeless tobacco: Never Used     Comment: tobacco  - none  . Alcohol use Yes     Comment: 1-2 glasses of wine/gin daily   . Drug use: No  . Sexual activity: No   Other Topics Concern  . 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 She has never used smokeless tobacco. She reports that she drinks alcohol. She reports that she does not use drugs.  Allergies as of 10/04/2016      Reactions   Erythromycin Nausea And Vomiting   Flecainide Acetate  Postural hypotension   Penicillins    Simvastatin    Pains in arms and legs      Medication List       Accurate as of 10/04/16 10:45 AM. Always use your most recent med list.          cholecalciferol 1000 units tablet Commonly known as:  VITAMIN D Take 1,000 Units by mouth daily.   diltiazem 240 MG 24 hr capsule Commonly known as:  DILACOR XR Take 240 mg by mouth daily.   erythromycin ophthalmic ointment   losartan 50 MG tablet Commonly known as:  COZAAR Take 50 mg by mouth daily.   metoprolol succinate 50 MG 24 hr tablet Commonly known as:  TOPROL-XL take 1 tablet by mouth twice a day for HEART RHYTHM   PRESERVISION AREDS PO Take 1 tablet by mouth daily.   simvastatin 40 MG tablet Commonly known as:  ZOCOR Take one tablet by mouth once daily to lower cholesterol   Turmeric Curcumin Caps Take 1 capsule by mouth daily.   XARELTO 20 MG Tabs tablet Generic drug:  rivaroxaban take 1 tablet by mouth once daily WITH DINNER       Review of Systems:  Review of Systems  Constitutional: Negative for chills, fever and malaise/fatigue.  HENT: Positive for congestion and hearing loss.   Eyes: Negative for blurred vision.       Glasses  Respiratory: Positive for cough and sputum production.     Cardiovascular: Negative for chest pain, palpitations and leg swelling.  Gastrointestinal: Negative for abdominal pain, blood in stool, constipation, diarrhea, heartburn, melena, nausea and vomiting.  Genitourinary: Negative for dysuria, frequency and urgency.  Musculoskeletal: Positive for falls. Negative for joint pain and myalgias.  Skin: Negative for itching and rash.  Neurological: Positive for tingling and sensory change. Negative for dizziness, loss of consciousness and weakness.  Endo/Heme/Allergies: Does not bruise/bleed easily.  Psychiatric/Behavioral: Negative for depression and memory loss. The patient is nervous/anxious. The patient does not have insomnia.     Physical Exam: Vitals:   10/04/16 1009  BP: 120/70  Pulse: (!) 104  Temp: 98.9 F (37.2 C)  TempSrc: Oral  SpO2: 97%  Weight: 148 lb (67.1 kg)  Height: 5\' 4"  (1.626 m)   Body mass index is 25.4 kg/m. Physical Exam  Constitutional: She is oriented to person, place, and time. She appears well-developed and well-nourished. No distress.  HENT:  Head: Normocephalic and atraumatic.  Right Ear: External ear normal.  Left Ear: External ear normal.  Nose: Nose normal.  Postnasal drip visible (clear)  Eyes: Conjunctivae and EOM are normal. Pupils are equal, round, and reactive to light.  glasses  Neck: Normal range of motion. Neck supple. No JVD present. No thyromegaly present.  Cardiovascular: Intact distal pulses.   irreg irreg  Pulmonary/Chest: Effort normal and breath sounds normal. No respiratory distress.  Abdominal: Soft. Bowel sounds are normal. She exhibits no distension and no mass. There is no tenderness. There is no rebound and no guarding. No hernia.  Musculoskeletal: Normal range of motion.  Slight kyphosis and stooped posture  Lymphadenopathy:    She has no cervical adenopathy.  Neurological: She is alert and oriented to person, place, and time. She displays normal reflexes. A sensory deficit is  present. No cranial nerve deficit. She exhibits normal muscle tone. Coordination normal.  Skin: Skin is warm and dry. Capillary refill takes less than 2 seconds.  Psychiatric: She has a normal mood and affect. Her behavior is  normal. Judgment and thought content normal.    Labs reviewed: Basic Metabolic Panel:  Recent Labs  09/26/16 0951  NA 140  K 4.1  BUN 16  CREATININE 0.7   Liver Function Tests:  Recent Labs  09/26/16 0951  AST 26  ALT 13  ALKPHOS 84   No results for input(s): LIPASE, AMYLASE in the last 8760 hours. No results for input(s): AMMONIA in the last 8760 hours. CBC:  Recent Labs  09/26/16 0951  WBC 5.2  HGB 14.1  HCT 43  PLT 211   Lipid Panel:  Recent Labs  09/26/16 0951  CHOL 153  HDL 43  LDLCALC 90  TRIG 100   Assessment/Plan 1. Medicare annual wellness visit, subsequent -completed today  2. Annual physical exam -completed today  3. Paroxysmal atrial fibrillation (HCC) -cont diltiazem and xarelto, just had labs all wnl -HR improved to normal by time of examination by me -follows with Dr. Einar Gip  4. Essential hypertension -bp at goal, cont same regimen  5. Viral upper respiratory tract infection with cough -encouraged warm humidity, hydration with 6-8 8oz glasses of water per day, mucinex otc for thick mucus, and given Rx for tussionex to help with her persistent tickle/cough  Labs/tests ordered:  No new today, just had full panel Next appt:  6 mos  Lucille Crichlow L. Antaeus Karel, D.O. [facility] [address], [place] [address] Cell Phone (Mon-Fri 8am-5pm):  [phone] On Call:  [phone] & follow prompts after 5pm & weekends Office Phone:  [phone] Office Fax:  [phone]

## 2016-11-02 ENCOUNTER — Encounter: Payer: Self-pay | Admitting: Internal Medicine

## 2016-11-02 ENCOUNTER — Ambulatory Visit
Admission: RE | Admit: 2016-11-02 | Discharge: 2016-11-02 | Disposition: A | Discharge status: Home / Self Care | Payer: Medicare Other | Source: Ambulatory Visit | Attending: Internal Medicine | Admitting: Internal Medicine

## 2016-11-02 ENCOUNTER — Ambulatory Visit (INDEPENDENT_AMBULATORY_CARE_PROVIDER_SITE_OTHER): Payer: Medicare Other | Admitting: Internal Medicine

## 2016-11-02 VITALS — BP 142/78 | HR 97 | Temp 98.0°F | Ht 64.0 in | Wt 149.6 lb

## 2016-11-02 DIAGNOSIS — J209 Acute bronchitis, unspecified: Secondary | ICD-10-CM

## 2016-11-02 MED ORDER — ALBUTEROL SULFATE HFA 108 (90 BASE) MCG/ACT IN AERS: 2.0000 | INHALATION_SPRAY | Freq: Four times a day (QID) | RESPIRATORY_TRACT | 2 refills | Status: DC | PRN

## 2016-11-02 MED ORDER — HYDROCOD POLST-CPM POLST ER 10-8 MG/5ML PO SUER: 5.0000 mL | Freq: Two times a day (BID) | ORAL | 0 refills | Status: DC | PRN

## 2016-11-02 NOTE — Patient Instructions (Signed)
Drink plenty of water. Try to rest. Use the albuterol inhaler 2 puffs up to 4 times a day for wheezing.   It can make your heart race if you overdo it Get a chest xray at [place] to check if you have pneumonia or something unusual going on--when I get the report, I'll send in an antibiotic. Continue to use the prescription cough syrup at bedtime or when you really need not to cough. Get back to your water aerobics to keep your airways open from the steam.   Continue the mucinex also.

## 2016-11-02 NOTE — Progress Notes (Signed)
Location:  [facility] clinic Provider: Beula Joyner L. Mariea Clonts, D.O., C.M.D.  Code Status: DNR Goals of Care:  Advanced Directives 11/02/2016  Does Patient Have a Medical Advance Directive? Yes  Type of Advance Directive Out of facility DNR (pink MOST or yellow form);Healthcare Power of Attorney  Does patient want to make changes to medical advance directive? -  Copy of [place] in Chart? Yes  Pre-existing out of facility DNR order (yellow form or pink MOST form) Yellow form placed in chart (order not valid for inpatient use)   Chief Complaint  Patient presents with  . URI    started in Nov. 2017 started getting worse about a week ago, patient stated she has been taking Mucinex and Tussionex    HPI: Patient is a 83 y.o. female seen today for an acute visit for ongoing cough since last Nov that has gotten significantly worse last week.  I had prescribed her tussionex to use at hs. She did have benefit from that, but only began mucinex after seeing the [facility] clinic nurse earlier this week.  Warm tea helped yesterday.   Newly bringing up a small amt of mucus, but unable to see it (not enough to tell that).  She is wheezing a little when she breathes in the past few days.  She had been to [facility] to visit to her children and still did not get better.  She is terribly hoarse.   Past Medical History:  Diagnosis Date  . Actinic keratosis 09/23/2012  . 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 Arthralgia of temporomandibular joint 12/30/2009  . Atrial fibrillation ([place])   . Atrial tachycardia ([place])    s/p ablation 2008. [facility] (R free wall Atach)  . Atrophy of vulva 09/23/2012  . Blepharochalasis 03/01/2011  . Cardiac dysrhythmia, unspecified 11/10/2009  . Dermatophytosis of nail 02/28/2008  . Dizziness and giddiness 04/03/2007  . Dysphagia, unspecified(787.20) 01/17/2012  . Edema 11/21/2006  . External hemorrhoids without mention of complication 77/82/4235  . HTN (hypertension)   . Hyperlipidemia   .  Insomnia, unspecified 11/18/2004  . Internal hemorrhoids without mention of complication 3/61/4431  . Left bundle branch hemiblock   . Loss of weight 03/05/2012  . Myalgia and myositis, unspecified 01/05/2009  . Orthostatic hypotension 03/22/2007  . Other malaise and fatigue 03/05/2012  . Pain in joint, site unspecified 02/28/2008  . Palpitations 11/21/2006  . Personality change due to conditions classified elsewhere   . Rectocele 09/23/2012  . Restless legs syndrome (RLS)   . Rosacea 09/23/2012  . Sebaceous cyst 03/01/2011  . Thumb pain 11/20/2012  . Unspecified hereditary and idiopathic peripheral neuropathy     Past Surgical History:  Procedure Laterality Date  . BLADDER SUSPENSION  1998  . CATARACT EXTRACTION W/ INTRAOCULAR LENS IMPLANT Right 4/8//2008   Dr. Charise Killian  . catheter ablation  2008   for atrial tachycardia  . CYST EXCISION  1998   mucus cyst removal from finger  . TOTAL ABDOMINAL HYSTERECTOMY  2001   uterine bleeding    Allergies  Allergen Reactions  . Erythromycin Nausea And Vomiting  . Flecainide Acetate     Postural hypotension  . Penicillins   . Simvastatin     Pains in arms and legs    Allergies as of 11/02/2016      Reactions   Erythromycin Nausea And Vomiting   Flecainide Acetate    Postural hypotension   Penicillins    Simvastatin    Pains in arms and  legs      Medication List       Accurate as of 11/02/16 11:18 AM. Always use your most recent med list.          chlorpheniramine-HYDROcodone 10-8 MG/5ML Suer Commonly known as:  TUSSIONEX PENNKINETIC ER Take 5 mLs by mouth every 12 (twelve) hours as needed for cough.   cholecalciferol 1000 units tablet Commonly known as:  VITAMIN D Take 1,000 Units by mouth daily.   diltiazem 240 MG 24 hr capsule Commonly known as:  DILACOR XR Take 240 mg by mouth daily.   erythromycin ophthalmic ointment   losartan 50 MG tablet Commonly known as:  COZAAR Take 50 mg by mouth daily.   metoprolol succinate  50 MG 24 hr tablet Commonly known as:  TOPROL-XL take 1 tablet by mouth twice a day for HEART RHYTHM   PRESERVISION AREDS PO Take 1 tablet by mouth daily.   simvastatin 40 MG tablet Commonly known as:  ZOCOR Take one tablet by mouth once daily to lower cholesterol   Turmeric Curcumin Caps Take 1 capsule by mouth daily.   XARELTO 20 MG Tabs tablet Generic drug:  rivaroxaban take 1 tablet by mouth once daily WITH DINNER       Review of Systems:  Review of Systems  Constitutional: Negative for chills, diaphoresis, fever, malaise/fatigue and weight loss.  HENT: Negative for congestion and sore throat.        Layngitis  Respiratory: Positive for cough, sputum production and wheezing. Negative for shortness of breath.   Cardiovascular: Negative for chest pain and palpitations.  Gastrointestinal: Negative for abdominal pain.  Genitourinary: Negative for dysuria.  Musculoskeletal: Negative for falls.  Neurological: Negative for dizziness, loss of consciousness and weakness.  Endo/Heme/Allergies: Bruises/bleeds easily.  Psychiatric/Behavioral: Negative for depression and memory loss.    Health Maintenance  Topic Date Due  . TETANUS/TDAP  07/14/2025  . INFLUENZA VACCINE  Completed  . DEXA SCAN  Completed  . PNA vac Low Risk Adult  Completed    Physical Exam: Vitals:   11/02/16 1109  BP: (!) 142/78  Pulse: 97  Temp: 98 F (36.7 C)  TempSrc: Oral  SpO2: 99%  Weight: 149 lb 9.6 oz (67.9 kg)  Height: 5\' 4"  (1.626 m)   Body mass index is 25.68 kg/m. Physical Exam  Constitutional: She is oriented to person, place, and time. She appears well-developed and well-nourished. No distress.  HENT:  Head: Normocephalic and atraumatic.  Right Ear: External ear normal.  Left Ear: External ear normal.  Nose: Nose normal.  Mouth/Throat: Oropharynx is clear and moist. No oropharyngeal exudate.  Some postnasal drip; no white patches or erythema  Eyes: Conjunctivae are normal.    Neck: Neck supple. No JVD present.  Some small lymph nodes, nontender  Cardiovascular:  irreg irreg  Pulmonary/Chest: Effort normal. No respiratory distress. She has no wheezes.  Few rhonchi in upper lung fields, no audible wheezing at time of appt  Musculoskeletal: Normal range of motion.  Lymphadenopathy:    She has cervical adenopathy.  Neurological: She is alert and oriented to person, place, and time.  Skin: Skin is warm and dry.  Psychiatric: She has a normal mood and affect.    Labs reviewed: Basic Metabolic Panel:  Recent Labs  09/26/16  NA 140  K 4.1  BUN 16  CREATININE 0.7   Liver Function Tests:  Recent Labs  09/26/16 0951  AST 26  ALT 13  ALKPHOS 84   No results for  input(s): LIPASE, AMYLASE in the last 8760 hours. No results for input(s): AMMONIA in the last 8760 hours. CBC:  Recent Labs  09/26/16  WBC 5.2  HGB 14.1  HCT 43  PLT 211   Lipid Panel:  Recent Labs  09/26/16 0951  CHOL 153  HDL 43  LDLCALC 90  TRIG 100    Assessment/Plan 1. Bronchitis, acute, with bronchospasm -seems she had improved, but then worsened upon returning from [facility] earlier this week -now having some wheezing and coughing more - agree with use of mucinex chest congestion only short term due to bp effects (but not getting mucus up and out w/o it)  -add albuterol for wheezing spells only - tussionex at hs if needed to stop cough so she can rest -hydrate -rest -but do water aerobics so can inhale warm steam -CXR due to longstanding symptoms but suspect a bronchitis and that she's actually had more than one respiratory illness not just one since November--will choose abx based on results -she worries about lung cancer due to parents both being smokers and secondary exposure - albuterol (PROVENTIL HFA;VENTOLIN HFA) 108 (90 Base) MCG/ACT inhaler; Inhale 2 puffs into the lungs every 6 (six) hours as needed for wheezing or shortness of breath.  Dispense: 1 Inhaler; Refill:  2 - chlorpheniramine-HYDROcodone (TUSSIONEX PENNKINETIC ER) 10-8 MG/5ML SUER; Take 5 mLs by mouth every 12 (twelve) hours as needed for cough.  Dispense: 140 mL; Refill: 0 - DG Chest 2 View; Future  Labs/tests ordered:   Orders Placed This Encounter  Procedures  . DG Chest 2 View    Standing Status:   Future    Standing Expiration Date:   01/02/2018    Order Specific Question:   Reason for Exam (SYMPTOM  OR DIAGNOSIS REQUIRED)    Answer:   chronic cough and hoarseness since Nov, worse x 1-2 weeks    Order Specific Question:   Preferred imaging location?    Answer:   [facility]   Next appt:  04/04/2017 regular visit and prn  Nargis Abrams L. Chukwudi Ewen, D.O. [facility] [address], [place] [address] Cell Phone (Mon-Fri 8am-5pm):  [phone] On Call:  [phone] & follow prompts after 5pm & weekends Office Phone:  [phone] Office Fax:  [phone]

## 2016-11-02 NOTE — Addendum Note (Signed)
Addended by: Gayland Curry on: 11/02/2016 12:31 PM   Modules accepted: Level of Service

## 2016-11-03 ENCOUNTER — Telehealth: Payer: Self-pay

## 2016-11-03 MED ORDER — DOXYCYCLINE HYCLATE 100 MG PO TABS: 100.0000 mg | ORAL_TABLET | Freq: Two times a day (BID) | ORAL | 0 refills | Status: DC

## 2016-11-03 NOTE — Telephone Encounter (Signed)
Discussed results with patient, patient verbalized understanding of results  RX sent to [facility]

## 2016-11-03 NOTE — Telephone Encounter (Signed)
-----   Message from Gayland Curry, DO sent at 11/02/2016  5:32 PM EDT ----- CXR showed no evident of pnumonia.  She has mild hyperinflation of her lungs which could be from secondary smoke exposure for years.  I'd like her to take doxycycline 100mg  po bid for 10 days for her bronchitis.  Please send to her pharmacy

## 2016-12-06 ENCOUNTER — Other Ambulatory Visit: Payer: Self-pay | Admitting: Internal Medicine

## 2016-12-12 ENCOUNTER — Other Ambulatory Visit: Payer: Self-pay | Admitting: Internal Medicine

## 2017-01-09 ENCOUNTER — Other Ambulatory Visit: Payer: Self-pay | Admitting: Internal Medicine

## 2017-01-09 DIAGNOSIS — M7989 Other specified soft tissue disorders: Secondary | ICD-10-CM

## 2017-01-09 DIAGNOSIS — M79662 Pain in left lower leg: Secondary | ICD-10-CM

## 2017-01-10 ENCOUNTER — Non-Acute Institutional Stay: Payer: Medicare Other | Admitting: Internal Medicine

## 2017-01-10 ENCOUNTER — Encounter: Payer: Self-pay | Admitting: Internal Medicine

## 2017-01-10 VITALS — BP 110/70 | HR 90 | Temp 99.0°F | Wt 153.0 lb

## 2017-01-10 DIAGNOSIS — M79662 Pain in left lower leg: Secondary | ICD-10-CM | POA: Diagnosis not present

## 2017-01-10 DIAGNOSIS — Z7901 Long term (current) use of anticoagulants: Secondary | ICD-10-CM

## 2017-01-10 DIAGNOSIS — I480 Paroxysmal atrial fibrillation: Secondary | ICD-10-CM | POA: Diagnosis not present

## 2017-01-10 DIAGNOSIS — M7989 Other specified soft tissue disorders: Secondary | ICD-10-CM

## 2017-01-10 NOTE — Progress Notes (Signed)
Location:  [facility] Provider: Elysha Daw L. Mariea Clonts, D.O., C.M.D.  Code Status: DNR Goals of Care:  Advanced Directives 11/02/2016  Does Patient Have a Medical Advance Directive? Yes  Type of Advance Directive Out of facility DNR (pink MOST or yellow form);Healthcare Power of Attorney  Does patient want to make changes to medical advance directive? -  Copy of [place] in Chart? Yes  Pre-existing out of facility DNR order (yellow form or pink MOST form) Yellow form placed in chart (order not valid for inpatient use)     Chief Complaint  Patient presents with  . Acute Visit    left leg pain and swelling x 2 weeks    HPI: Patient is a 83 y.o. female seen today for an acute visit for left leg pain and swelling for 2 weeks.  She reports feeling a pop behind her left knee in her lower thigh when it started while doing exercises on her toes during exercise class on 5/22.  She later took a fall and was helped by a neighbor a few days later--she apparently felt nauseous and weak and fell over. She has been able to walk on the leg but continued to have pain in that area and down the leg into the ankle.  Staff have become increasingly concerned about the appearance and insisted she make an appt to be seen.  She has the ankle wrapped with an ace and has been putting ice on the sore area and elevating the leg.  She has bruising from 2/3 of the way down her posterior thigh into her foot.  She is tender from her medial knee down into her foot.  We have already scheduled her for a venous ultrasound tomorrow to r/o dvt as her left leg is twice the size of the right. She has been using tylenol for pain.    Past Medical History:  Diagnosis Date  . Actinic keratosis 09/23/2012  . 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 Arthralgia of temporomandibular joint 12/30/2009  . Atrial fibrillation ([place])   . Atrial tachycardia ([place])    s/p ablation 2008. [facility] (R free wall Atach)  . Atrophy of vulva  09/23/2012  . Blepharochalasis 03/01/2011  . Cardiac dysrhythmia, unspecified 11/10/2009  . Dermatophytosis of nail 02/28/2008  . Dizziness and giddiness 04/03/2007  . Dysphagia, unspecified(787.20) 01/17/2012  . Edema 11/21/2006  . External hemorrhoids without mention of complication 88/41/6606  . HTN (hypertension)   . Hyperlipidemia   . Insomnia, unspecified 11/18/2004  . Internal hemorrhoids without mention of complication 10/12/6008  . Left bundle branch hemiblock   . Loss of weight 03/05/2012  . Myalgia and myositis, unspecified 01/05/2009  . Orthostatic hypotension 03/22/2007  . Other malaise and fatigue 03/05/2012  . Pain in joint, site unspecified 02/28/2008  . Palpitations 11/21/2006  . Personality change due to conditions classified elsewhere   . Rectocele 09/23/2012  . Restless legs syndrome (RLS)   . Rosacea 09/23/2012  . Sebaceous cyst 03/01/2011  . Thumb pain 11/20/2012  . Unspecified hereditary and idiopathic peripheral neuropathy     Past Surgical History:  Procedure Laterality Date  . BLADDER SUSPENSION  1998  . CATARACT EXTRACTION W/ INTRAOCULAR LENS IMPLANT Right 4/8//2008   Dr. Charise Killian  . catheter ablation  2008   for atrial tachycardia  . CYST EXCISION  1998   mucus cyst removal from finger  . TOTAL ABDOMINAL HYSTERECTOMY  2001   uterine bleeding    Allergies  Allergen Reactions  .  Erythromycin Nausea And Vomiting  . Flecainide Acetate     Postural hypotension  . Penicillins   . Simvastatin     Pains in arms and legs    Allergies as of 01/10/2017      Reactions   Erythromycin Nausea And Vomiting   Flecainide Acetate    Postural hypotension   Penicillins    Simvastatin    Pains in arms and legs      Medication List       Accurate as of 01/10/17 12:42 PM. Always use your most recent med list.          albuterol 108 (90 Base) MCG/ACT inhaler Commonly known as:  PROVENTIL HFA;VENTOLIN HFA Inhale 2 puffs into the lungs every 6 (six) hours as needed for  wheezing or shortness of breath.   chlorpheniramine-HYDROcodone 10-8 MG/5ML Suer Commonly known as:  TUSSIONEX PENNKINETIC ER Take 5 mLs by mouth every 12 (twelve) hours as needed for cough.   cholecalciferol 1000 units tablet Commonly known as:  VITAMIN D Take 1,000 Units by mouth daily.   diltiazem 240 MG 24 hr capsule Commonly known as:  DILACOR XR Take 240 mg by mouth daily.   doxycycline 100 MG tablet Commonly known as:  VIBRA-TABS Take 1 tablet (100 mg total) by mouth 2 (two) times daily.   erythromycin ophthalmic ointment   losartan 50 MG tablet Commonly known as:  COZAAR Take 50 mg by mouth daily.   metoprolol succinate 50 MG 24 hr tablet Commonly known as:  TOPROL-XL take 1 tablet by mouth twice a day for HEART RHYTHM   PRESERVISION AREDS PO Take 1 tablet by mouth daily.   simvastatin 40 MG tablet Commonly known as:  ZOCOR take 1 tablet by mouth once daily TO LOWER CHOLESTROL   Turmeric Curcumin Caps Take 1 capsule by mouth daily.   XARELTO 20 MG Tabs tablet Generic drug:  rivaroxaban take 1 tablet by mouth once daily WITH DINNER       Review of Systems:  Review of Systems  Constitutional: Positive for malaise/fatigue. Negative for chills and fever.  HENT: Negative for congestion.   Eyes: Negative for blurred vision.       Glasses  Respiratory: Negative for shortness of breath.   Cardiovascular: Positive for leg swelling. Negative for chest pain and palpitations.  Gastrointestinal: Positive for nausea. Negative for abdominal pain.  Genitourinary: Negative for dysuria.  Musculoskeletal: Positive for falls and myalgias.  Neurological: Positive for dizziness. Negative for weakness.  Endo/Heme/Allergies: Bruises/bleeds easily.  Psychiatric/Behavioral: Negative for depression and memory loss.    Health Maintenance  Topic Date Due  . INFLUENZA VACCINE  03/14/2017  . TETANUS/TDAP  07/14/2025  . DEXA SCAN  Completed  . PNA vac Low Risk Adult   Completed    Physical Exam: Vitals:   01/10/17 0902  BP: 110/70  Pulse: 90  Temp: 99 F (37.2 C)  TempSrc: Oral  SpO2: 95%  Weight: 153 lb (69.4 kg)   Body mass index is 26.26 kg/m. Physical Exam  Constitutional: She is oriented to person, place, and time. She appears well-developed and well-nourished.  Cardiovascular: Intact distal pulses.   irreg irreg  Pulmonary/Chest: Effort normal and breath sounds normal. No respiratory distress.  Abdominal: Bowel sounds are normal.  Musculoskeletal: Normal range of motion. She exhibits edema and tenderness.  Left leg with purple discoloration from distal third of thigh to toes, more purple medially, left leg also twice the size of right, limping on it, it  is warm but only very minimally warmer than right and w/o erythema or open areas, she is walking with her rollator walker  Neurological: She is alert and oriented to person, place, and time.  Skin: Capillary refill takes less than 2 seconds.  Psychiatric: She has a normal mood and affect.    Labs reviewed: Basic Metabolic Panel:  Recent Labs  09/26/16  NA 140  K 4.1  BUN 16  CREATININE 0.7   Liver Function Tests:  Recent Labs  09/26/16 0951  AST 26  ALT 13  ALKPHOS 84   No results for input(s): LIPASE, AMYLASE in the last 8760 hours. No results for input(s): AMMONIA in the last 8760 hours. CBC:  Recent Labs  09/26/16  WBC 5.2  HGB 14.1  HCT 43  PLT 211   Lipid Panel:  Recent Labs  09/26/16 0951  CHOL 153  HDL 43  LDLCALC 90  TRIG 100   Assessment/Plan 1. Pain and swelling of left lower leg -differential includes:  Ruptured baker's cyst, ruptured tendon or ligament, DVT or large hematoma from fall -has ultrasound tomorrow to evaluate -may need ortho if rupture of tendon or ligament has occurred as suspected; not much will be done if baker's cyst ruptured -cont tylenol, RICE and walker use  2. Paroxysmal atrial fibrillation (HCC) -stable, cont on  xarelto, diltiazem and toprol xl  3. Chronic anticoagulation -on xarelto, cont and monitor cbc, bmp  Labs/tests ordered:  No orders of the defined types were placed in this encounter.   Next appt:  04/04/2017  Naz Denunzio L. Artesha Wemhoff, D.O. [facility] [address], [place] [address] Cell Phone (Mon-Fri 8am-5pm):  [phone] On Call:  [phone] & follow prompts after 5pm & weekends Office Phone:  [phone] Office Fax:  [phone]

## 2017-01-11 ENCOUNTER — Ambulatory Visit (HOSPITAL_COMMUNITY)
Admission: RE | Admit: 2017-01-11 | Discharge: 2017-01-11 | Disposition: A | Discharge status: Home / Self Care | Payer: Medicare Other | Source: Ambulatory Visit | Attending: Internal Medicine | Admitting: Internal Medicine

## 2017-01-11 DIAGNOSIS — M7989 Other specified soft tissue disorders: Secondary | ICD-10-CM | POA: Diagnosis present

## 2017-01-11 DIAGNOSIS — M79662 Pain in left lower leg: Secondary | ICD-10-CM | POA: Diagnosis not present

## 2017-01-11 NOTE — Progress Notes (Addendum)
*  Preliminary Results* Left lower extremity venous duplex completed. Left lower extremity is negative for deep vein thrombosis. There is no evidence of left Baker's cyst.  Incidental finding: there are multiple anechoic areas in the proximal to mid calf, suggestive of possible muscle tear versus hematoma versus unknown etiology.  Preliminary results discussed with [facility] of Dr. Cyndi Lennert office.  01/11/2017 10:40 AM  Maudry Mayhew, BS, RVT, RDCS, RDMS

## 2017-01-15 ENCOUNTER — Other Ambulatory Visit: Payer: Self-pay | Admitting: Internal Medicine

## 2017-01-15 DIAGNOSIS — S86112A Strain of other muscle(s) and tendon(s) of posterior muscle group at lower leg level, left leg, initial encounter: Secondary | ICD-10-CM

## 2017-01-16 ENCOUNTER — Encounter (HOSPITAL_COMMUNITY): Payer: Medicare Other

## 2017-02-26 ENCOUNTER — Other Ambulatory Visit: Payer: Self-pay | Admitting: Internal Medicine

## 2017-02-26 DIAGNOSIS — M19041 Primary osteoarthritis, right hand: Secondary | ICD-10-CM

## 2017-03-09 ENCOUNTER — Other Ambulatory Visit: Payer: Self-pay | Admitting: Internal Medicine

## 2017-03-09 NOTE — Telephone Encounter (Signed)
Patient requested refill to be faxed to pharmacy.  

## 2017-04-04 ENCOUNTER — Encounter: Payer: Self-pay | Admitting: Internal Medicine

## 2017-04-04 ENCOUNTER — Non-Acute Institutional Stay: Payer: Medicare Other | Admitting: Internal Medicine

## 2017-04-04 VITALS — BP 128/70 | HR 60 | Temp 98.9°F | Wt 149.0 lb

## 2017-04-04 DIAGNOSIS — H6123 Impacted cerumen, bilateral: Secondary | ICD-10-CM | POA: Insufficient documentation

## 2017-04-04 DIAGNOSIS — I872 Venous insufficiency (chronic) (peripheral): Secondary | ICD-10-CM

## 2017-04-04 DIAGNOSIS — M720 Palmar fascial fibromatosis [Dupuytren]: Secondary | ICD-10-CM | POA: Diagnosis not present

## 2017-04-04 DIAGNOSIS — H353190 Nonexudative age-related macular degeneration, unspecified eye, stage unspecified: Secondary | ICD-10-CM

## 2017-04-04 DIAGNOSIS — I482 Chronic atrial fibrillation: Secondary | ICD-10-CM

## 2017-04-04 DIAGNOSIS — E7800 Pure hypercholesterolemia, unspecified: Secondary | ICD-10-CM | POA: Diagnosis not present

## 2017-04-04 DIAGNOSIS — I4821 Permanent atrial fibrillation: Secondary | ICD-10-CM

## 2017-04-04 NOTE — Progress Notes (Signed)
Location:  [facility] clinic Provider:  Sameka Bagent L. Mariea Clonts, D.O., C.M.D.  Code Status: DNR Goals of Care:  Advanced Directives 04/04/2017  Does Patient Have a Medical Advance Directive? Yes  Type of Paramedic of [place];Out of facility DNR (pink MOST or yellow form)  Does patient want to make changes to medical advance directive? -  Copy of [place] in Chart? Yes  Pre-existing out of facility DNR order (yellow form or pink MOST form) Yellow form placed in chart (order not valid for inpatient use)   Chief Complaint  Patient presents with  . Medical Management of Chronic Issues    35mth follow-up    HPI: Patient is a 83 y.o. female seen today for medical management of chronic diseases/ 6 month f/u visit.  She has a h/o HTN, venous insufficiency,  afib on xarelto , hyperlipidemia, macular degeneration, gout, and osteoarthritis.    Flu shots will be given in a flu clinic at [facility] in October.  She is otherwise up to date on health maintenance.  4th finger on right hand.  Requests voltaren be renewed.  It's getting worse and sticking more in the morning.    Afib:  No palpitations, sob.  Doing fine on xarelto.  Rate controlled with cartia XT 240mg  and toprol-xl 50mg  daily.  Had an ablation at [facility] in 2008 for atrial tachycardia.    Hyperlipidemia:  On zocor.  LDL was 90 at last check.    HTN:  On losartan, toprol and cardizem.    Shingrix #1 02/12/17.    Past Medical History:  Diagnosis Date  . Actinic keratosis 09/23/2012  . 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 Arthralgia of temporomandibular joint 12/30/2009  . Atrial fibrillation ([place])   . Atrial tachycardia ([place])    s/p ablation 2008. [facility] (R free wall Atach)  . Atrophy of vulva 09/23/2012  . Blepharochalasis 03/01/2011  . Cardiac dysrhythmia, unspecified 11/10/2009  . Dermatophytosis of nail 02/28/2008  . Dizziness and giddiness 04/03/2007  . Dysphagia, unspecified(787.20) 01/17/2012  . Edema  11/21/2006  . External hemorrhoids without mention of complication 09/62/8366  . HTN (hypertension)   . Hyperlipidemia   . Insomnia, unspecified 11/18/2004  . Internal hemorrhoids without mention of complication 2/94/7654  . Left bundle branch hemiblock   . Loss of weight 03/05/2012  . Myalgia and myositis, unspecified 01/05/2009  . Orthostatic hypotension 03/22/2007  . Other malaise and fatigue 03/05/2012  . Pain in joint, site unspecified 02/28/2008  . Palpitations 11/21/2006  . Personality change due to conditions classified elsewhere   . Rectocele 09/23/2012  . Restless legs syndrome (RLS)   . Rosacea 09/23/2012  . Sebaceous cyst 03/01/2011  . Thumb pain 11/20/2012  . Unspecified hereditary and idiopathic peripheral neuropathy     Past Surgical History:  Procedure Laterality Date  . BLADDER SUSPENSION  1998  . CATARACT EXTRACTION W/ INTRAOCULAR LENS IMPLANT Right 4/8//2008   Dr. Charise Killian  . catheter ablation  2008   for atrial tachycardia  . CYST EXCISION  1998   mucus cyst removal from finger  . TOTAL ABDOMINAL HYSTERECTOMY  2001   uterine bleeding    Allergies  Allergen Reactions  . Erythromycin Nausea And Vomiting  . Flecainide Acetate     Postural hypotension  . Penicillins   . Simvastatin     Pains in arms and legs    Allergies as of 04/04/2017      Reactions   Erythromycin Nausea And Vomiting  Flecainide Acetate    Postural hypotension   Penicillins    Simvastatin    Pains in arms and legs      Medication List       Accurate as of 04/04/17  8:39 AM. Always use your most recent med list.          CARTIA XT 240 MG 24 hr capsule Generic drug:  diltiazem   cholecalciferol 1000 units tablet Commonly known as:  VITAMIN D Take 1,000 Units by mouth daily.   diclofenac sodium 1 % Gel Commonly known as:  VOLTAREN Apply 2 g topically 4 (four) times daily.   losartan 50 MG tablet Commonly known as:  COZAAR Take 50 mg by mouth daily.   metoprolol succinate 50 MG  24 hr tablet Commonly known as:  TOPROL-XL TAKE 1 TABLET BY MOUTH TWICE DAILY FOR HEART RHYTHM   PRESERVISION AREDS PO Take 1 tablet by mouth daily.   simvastatin 40 MG tablet Commonly known as:  ZOCOR take 1 tablet by mouth once daily TO LOWER CHOLESTROL   Turmeric Curcumin Caps Take 1 capsule by mouth daily.   XARELTO 20 MG Tabs tablet Generic drug:  rivaroxaban take 1 tablet by mouth once daily WITH DINNER       Review of Systems:  Review of Systems  Constitutional: Negative for chills, fever and malaise/fatigue.  HENT: Positive for hearing loss. Negative for congestion.        Had cerumen impaction  Eyes: Negative for blurred vision.       Macular degeneration, glasses  Respiratory: Negative for cough and shortness of breath.   Cardiovascular: Positive for leg swelling. Negative for chest pain and palpitations.  Gastrointestinal: Negative for abdominal pain, blood in stool, constipation and melena.  Genitourinary: Negative for dysuria.  Musculoskeletal: Positive for joint pain. Negative for falls.       Right hand with 4th finger particularly bothersome and contracted in am when she awakens  Skin: Negative for itching and rash.  Neurological: Negative for dizziness, loss of consciousness and weakness.  Endo/Heme/Allergies: Bruises/bleeds easily.  Psychiatric/Behavioral: Negative for depression and memory loss.    Health Maintenance  Topic Date Due  . INFLUENZA VACCINE  03/14/2017  . TETANUS/TDAP  07/14/2025  . DEXA SCAN  Completed  . PNA vac Low Risk Adult  Completed    Physical Exam: Vitals:   04/04/17 0834  Pulse: 60  Temp: 98.9 F (37.2 C)  TempSrc: Oral  SpO2: 96%  Weight: 149 lb (67.6 kg)   Body mass index is 25.58 kg/m. Physical Exam  Constitutional: She is oriented to person, place, and time. She appears well-developed and well-nourished.  Cardiovascular: Intact distal pulses.   irreg irreg  Pulmonary/Chest: Effort normal and breath sounds  normal. No respiratory distress.  Abdominal: Soft. Bowel sounds are normal. She exhibits no distension. There is no tenderness.  Neurological: She is alert and oriented to person, place, and time.  Skin: Skin is warm and dry.  Venous stasis changes    Labs reviewed: Basic Metabolic Panel:  Recent Labs  09/26/16  NA 140  K 4.1  BUN 16  CREATININE 0.7   Liver Function Tests:  Recent Labs  09/26/16 0951  AST 26  ALT 13  ALKPHOS 84   No results for input(s): LIPASE, AMYLASE in the last 8760 hours. No results for input(s): AMMONIA in the last 8760 hours. CBC:  Recent Labs  09/26/16  WBC 5.2  HGB 14.1  HCT 43  PLT 211  Lipid Panel:  Recent Labs  09/26/16 0951  CHOL 153  HDL 43  LDLCALC 90  TRIG 100   Assessment/Plan 1. Permanent atrial fibrillation (HCC) -continues on xarelto, toprol, cardizem with rate control in place -does bruise and bleed easily (got bruise from her shingrix vaccine for example) -stable and asymptomatic, follows with cardiology  2. Venous insufficiency of both lower extremities -doing well, has venous stasis changes and some mild edema, worse in previously injured leg, not wearing compression hose and on her feet a lot running around--very active  3. Pure hypercholesterolemia -lipids at goal considering no known CAD and no diabetes--last LDL 90, check annually  4. Bilateral hearing loss due to cerumen impaction -small amount of cerumen was removed today with plastic loop and tweezers -advised to use debrox for a week each month at hs to keep cerumen moist so it works its way out  5. Nonexudative age-related macular degeneration, unspecified laterality, unspecified stage -follows with Dr. Ellie Lunch for this, no complaints about it today  6. Dupuytren's disease of palm of right hand -cont use of voltaren gel for the arthritis pain, but if she is having more difficulty with the finger sticking and pain, will send to ortho for injection (she  was not ready for that yet today)  Labs/tests ordered:  No new Next appt:  6 mos for CPE  Lorena Benham L. Tywaun Hiltner, D.O. [facility] [address], [place] [address] Cell Phone (Mon-Fri 8am-5pm):  [phone] On Call:  [phone] & follow prompts after 5pm & weekends Office Phone:  [phone] Office Fax:  [phone]

## 2017-06-12 ENCOUNTER — Telehealth: Payer: Self-pay | Admitting: *Deleted

## 2017-06-12 DIAGNOSIS — Z780 Asymptomatic menopausal state: Secondary | ICD-10-CM

## 2017-06-12 DIAGNOSIS — E2839 Other primary ovarian failure: Secondary | ICD-10-CM

## 2017-06-12 NOTE — Telephone Encounter (Signed)
Spoke with patient and she would like to get her bone density done, should I put the order in? Please advise

## 2017-06-12 NOTE — Telephone Encounter (Signed)
-----   Message from Gayland Curry, DO sent at 06/12/2017  8:23 AM EDT ----- It appears Mrs. Renbarger has opted not to have her bone density done unless she went elsewhere and we don't have a record.  Can you please check with her?    ----- Message ----- From: SYSTEM Sent: 06/10/2017  12:05 AM To: Gayland Curry, DO

## 2017-06-12 NOTE — Telephone Encounter (Signed)
Yes, please place [facility].

## 2017-06-13 NOTE — Telephone Encounter (Signed)
Bone density ordered.

## 2017-07-11 ENCOUNTER — Telehealth: Payer: Self-pay | Admitting: *Deleted

## 2017-07-11 MED ORDER — MIRTAZAPINE 7.5 MG PO TABS: 7.5000 mg | ORAL_TABLET | Freq: Every day | ORAL | 1 refills | Status: DC

## 2017-07-11 NOTE — Telephone Encounter (Signed)
Pt is having a hard time sleeping, going thru depression with the loss of her husband, lost 5-7lbs, trying to cope, starting to eat a little and exercise, just not able to sleep. Asking for something to help get her over the hump.

## 2017-07-11 NOTE — Telephone Encounter (Signed)
Spoke with patient and advised results rx sent to pharmacy by e-script  

## 2017-07-11 NOTE — Telephone Encounter (Signed)
Let's start remeron 7.5mg  at bedtime.

## 2017-07-24 ENCOUNTER — Telehealth: Payer: Self-pay | Admitting: Internal Medicine

## 2017-07-24 DIAGNOSIS — J40 Bronchitis, not specified as acute or chronic: Secondary | ICD-10-CM

## 2017-07-24 MED ORDER — BENZONATATE 100 MG PO CAPS: 100.0000 mg | ORAL_CAPSULE | Freq: Two times a day (BID) | ORAL | 0 refills | Status: DC | PRN

## 2017-07-24 NOTE — Addendum Note (Signed)
Addended by: Despina Hidden on: 07/24/2017 01:24 PM   Modules accepted: Orders

## 2017-07-24 NOTE — Telephone Encounter (Signed)
rx called into pharmacy

## 2017-07-24 NOTE — Telephone Encounter (Signed)
Received message from [facility] clinic nurse about pt with continued cough, low grade temp Sunday, requesting cough medication due to night time cough keeping her awake.  Please call in Rx for tessalon perles as entered.  [facility] not accepting e-prescribe for some reason.  Lasean Rahming L. Jaasiel Hollyfield, D.O. [facility] [address], [place] [address] Cell Phone (Mon-Fri 8am-5pm):  [phone] On Call:  [phone] & follow prompts after 5pm & weekends Office Phone:  [phone] Office Fax:  [phone]

## 2017-07-30 ENCOUNTER — Telehealth: Payer: Self-pay | Admitting: *Deleted

## 2017-07-30 NOTE — Telephone Encounter (Signed)
Noted.  If she is doing fine, it's ok.  I do prefer she check first before stopping her meds.  Hopefully, her cold is better, too.

## 2017-07-30 NOTE — Telephone Encounter (Signed)
Patient called and stated that she stopped taking the Mirtazapine because she did not have trouble sleeping. She stated that she told the [facility] Nurse that she stopped it and the Nurse told her that she shouldn't have stopped it and told her to call Dr. Cyndi Lennert office to let you know. Please Advise.

## 2017-07-31 NOTE — Telephone Encounter (Signed)
Patient notified and agreed. Stated that she is doing fine and feeling better.

## 2017-07-31 NOTE — Telephone Encounter (Signed)
Spoke with [facility] nurse kim and she will contact patient

## 2017-08-03 ENCOUNTER — Other Ambulatory Visit: Payer: Self-pay | Admitting: Nurse Practitioner

## 2017-08-03 ENCOUNTER — Ambulatory Visit
Admission: RE | Admit: 2017-08-03 | Discharge: 2017-08-03 | Disposition: A | Discharge status: Home / Self Care | Payer: Medicare Other | Source: Ambulatory Visit | Attending: Nurse Practitioner | Admitting: Nurse Practitioner

## 2017-08-03 ENCOUNTER — Encounter: Payer: Self-pay | Admitting: Nurse Practitioner

## 2017-08-03 ENCOUNTER — Ambulatory Visit (INDEPENDENT_AMBULATORY_CARE_PROVIDER_SITE_OTHER): Payer: Medicare Other | Admitting: Nurse Practitioner

## 2017-08-03 VITALS — BP 162/92 | HR 70 | Temp 98.7°F | Resp 17 | Ht 64.0 in | Wt 144.0 lb

## 2017-08-03 DIAGNOSIS — R05 Cough: Secondary | ICD-10-CM | POA: Diagnosis not present

## 2017-08-03 DIAGNOSIS — I10 Essential (primary) hypertension: Secondary | ICD-10-CM | POA: Diagnosis not present

## 2017-08-03 DIAGNOSIS — R059 Cough, unspecified: Secondary | ICD-10-CM

## 2017-08-03 LAB — CBC WITH DIFFERENTIAL/PLATELET
Basophils Absolute: 121 cells/uL (ref 0–200)
Basophils Relative: 1.7 %
EOS PCT: 1.7 %
Eosinophils Absolute: 121 cells/uL (ref 15–500)
HEMATOCRIT: 38.4 % (ref 35.0–45.0)
HEMOGLOBIN: 12.8 g/dL (ref 11.7–15.5)
LYMPHS ABS: 1221 {cells}/uL (ref 850–3900)
MCH: 31.1 pg (ref 27.0–33.0)
MCHC: 33.3 g/dL (ref 32.0–36.0)
MCV: 93.4 fL (ref 80.0–100.0)
MPV: 8.9 fL (ref 7.5–12.5)
Monocytes Relative: 15.3 %
NEUTROS ABS: 4551 {cells}/uL (ref 1500–7800)
Neutrophils Relative %: 64.1 %
Platelets: 412 10*3/uL — ABNORMAL HIGH (ref 140–400)
RBC: 4.11 10*6/uL (ref 3.80–5.10)
RDW: 12 % (ref 11.0–15.0)
Total Lymphocyte: 17.2 %
WBC mixed population: 1086 cells/uL — ABNORMAL HIGH (ref 200–950)
WBC: 7.1 10*3/uL (ref 3.8–10.8)

## 2017-08-03 MED ORDER — HYDROCODONE-HOMATROPINE 5-1.5 MG/5ML PO SYRP: 5.0000 mL | ORAL_SOLUTION | Freq: Four times a day (QID) | ORAL | 0 refills | Status: DC | PRN

## 2017-08-03 MED ORDER — DOXYCYCLINE HYCLATE 100 MG PO TABS: 100.0000 mg | ORAL_TABLET | Freq: Two times a day (BID) | ORAL | 0 refills | Status: DC

## 2017-08-03 MED ORDER — OMEPRAZOLE 20 MG PO CPDR: 20.0000 mg | DELAYED_RELEASE_CAPSULE | Freq: Every day | ORAL | 3 refills | Status: DC

## 2017-08-03 NOTE — Patient Instructions (Addendum)
To get chest xray and blood work.  Claritin (loraditine) 10 mg by mouth daily   Omeprazole 20 mg daily   To use hycodan cough syrup (this has hydrocodone in it) every 6 hours as needed for cough Can continue to use tessalon pearls.

## 2017-08-03 NOTE — Progress Notes (Signed)
Careteam: Patient Care Team: Gayland Curry, DO as PCP - General (Geriatric Medicine) Community, [facility], [place] Donovan, NP as Nurse Practitioner ([place]) Adrian Prows, MD as Consulting Physician (Cardiology) Earlean Polka, MD as Consulting Physician (Ophthalmology)  Advanced Directive information Does Patient Have a Medical Advance Directive?: Yes, Type of Advance Directive: [place];Out of facility DNR (pink MOST or yellow form), Pre-existing out of facility DNR order (yellow form or pink MOST form): Yellow form placed in chart (order not valid for inpatient use)  Allergies  Allergen Reactions  . Erythromycin Nausea And Vomiting  . Flecainide Acetate     Postural hypotension  . Penicillins   . Simvastatin     Pains in arms and legs    Chief Complaint  Patient presents with  . Acute Visit    Pt is being seen due to ongoing dry cough that has lasted several months. Cough started as throat tickling but now feels deeper. Pt reports no congestion. Cough is keeping pt up at night.      HPI: Patient is a 83 y.o. female seen in the office today due to ongoing cough.  Pt reports cough started in early November. Prior to today it was only a cough but last night could not stop coughing and it kept her up, now nose in running too.  No fever or chills. Other than not sleeping feels fine today. Appetite is down (started after her husband died).  No sore throat, no nasal congestion.  No chest congestion. No palpitations.  Hx of a fib.  Started as a tickling in her throat but now feels deeper but dry. Nonproductive.  No indigestion or GERD.  No hx of smoking.       Review of Systems:  Review of Systems  Constitutional: Negative for chills, fever and malaise/fatigue.  HENT: Positive for hearing loss. Negative for congestion, ear discharge, ear pain, sinus pain and sore throat.   Respiratory: Positive for cough. Negative for sputum  production, shortness of breath and wheezing.        More short of breath with activity  Cardiovascular: Negative for chest pain, palpitations and leg swelling.  Neurological: Negative for dizziness, weakness and headaches.    Past Medical History:  Diagnosis Date  . Actinic keratosis 09/23/2012  . 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 Arthralgia of temporomandibular joint 12/30/2009  . Atrial fibrillation ([place])   . Atrial tachycardia ([place])    s/p ablation 2008. [facility] (R free wall Atach)  . Atrophy of vulva 09/23/2012  . Blepharochalasis 03/01/2011  . Cardiac dysrhythmia, unspecified 11/10/2009  . Dermatophytosis of nail 02/28/2008  . Dizziness and giddiness 04/03/2007  . Dysphagia, unspecified(787.20) 01/17/2012  . Edema 11/21/2006  . External hemorrhoids without mention of complication 16/05/9603  . HTN (hypertension)   . Hyperlipidemia   . Insomnia, unspecified 11/18/2004  . Internal hemorrhoids without mention of complication 5/40/9811  . Left bundle branch hemiblock   . Loss of weight 03/05/2012  . Myalgia and myositis, unspecified 01/05/2009  . Orthostatic hypotension 03/22/2007  . Other malaise and fatigue 03/05/2012  . Pain in joint, site unspecified 02/28/2008  . Palpitations 11/21/2006  . Personality change due to conditions classified elsewhere   . Rectocele 09/23/2012  . Restless legs syndrome (RLS)   . Rosacea 09/23/2012  . Sebaceous cyst 03/01/2011  . Thumb pain 11/20/2012  . Unspecified hereditary and idiopathic peripheral neuropathy    Past Surgical History:  Procedure Laterality Date  .  BLADDER SUSPENSION  1998  . CATARACT EXTRACTION W/ INTRAOCULAR LENS IMPLANT Right 4/8//2008   Dr. Charise Killian  . catheter ablation  2008   for atrial tachycardia  . CYST EXCISION  1998   mucus cyst removal from finger  . TOTAL ABDOMINAL HYSTERECTOMY  2001   uterine bleeding   Social History:   reports that  has never smoked. she has never used smokeless tobacco. She reports that she drinks  alcohol. She reports that she does not use drugs.  Family History  Problem Relation Age of Onset  . Heart disease Mother   . Heart disease Father   . Cancer Sister        breast    Medications:   Medication List        Accurate as of 08/03/17 10:39 AM. Always use your most recent med list.          CARTIA XT 240 MG 24 hr capsule Generic drug:  diltiazem   cholecalciferol 1000 units tablet Commonly known as:  VITAMIN D   diclofenac sodium 1 % Gel Commonly known as:  VOLTAREN   losartan 50 MG tablet Commonly known as:  COZAAR   metoprolol succinate 50 MG 24 hr tablet Commonly known as:  TOPROL-XL TAKE 1 TABLET BY MOUTH TWICE DAILY FOR HEART RHYTHM   mirtazapine 7.5 MG tablet Commonly known as:  REMERON Take 1 tablet (7.5 mg total) by mouth at bedtime.   PRESERVISION AREDS PO   simvastatin 40 MG tablet Commonly known as:  ZOCOR take 1 tablet by mouth once daily TO LOWER CHOLESTROL   Turmeric Curcumin Caps   XARELTO 20 MG Tabs tablet Generic drug:  rivaroxaban        Physical Exam:  Vitals:   08/03/17 1027  BP: (!) 162/92  Pulse: 70  Resp: 17  Temp: 98.7 F (37.1 C)  TempSrc: Oral  SpO2: 97%  Weight: 144 lb (65.3 kg)  Height: 5\' 4"  (1.626 m)   Body mass index is 24.72 kg/m.  Physical Exam  Constitutional: She is oriented to person, place, and time. She appears well-developed and well-nourished. No distress.  HENT:  Head: Normocephalic and atraumatic.  Right Ear: External ear normal.  Left Ear: External ear normal.  Nose: Nose normal.  Mouth/Throat: Oropharynx is clear and moist. No oropharyngeal exudate.  Eyes: Conjunctivae are normal. Pupils are equal, round, and reactive to light.  Neck: Normal range of motion. Neck supple.  Cardiovascular: Normal rate and normal heart sounds. An irregular rhythm present.  Pulmonary/Chest: Effort normal and breath sounds normal.  Coughing throughout exam  Neurological: She is alert and oriented to  person, place, and time.  Skin: Skin is warm and dry. She is not diaphoretic.  Psychiatric: She has a normal mood and affect.    Labs reviewed: Basic Metabolic Panel: Recent Labs    09/26/16  NA 140  K 4.1  BUN 16  CREATININE 0.7   Liver Function Tests: Recent Labs    09/26/16 0951  AST 26  ALT 13  ALKPHOS 84   No results for input(s): LIPASE, AMYLASE in the last 8760 hours. No results for input(s): AMMONIA in the last 8760 hours. CBC: Recent Labs    09/26/16  WBC 5.2  HGB 14.1  HCT 43  PLT 211   Lipid Panel: Recent Labs    09/26/16 0951  CHOL 153  HDL 43  LDLCALC 90  TRIG 100   TSH: No results for input(s): TSH in the last  8760 hours. A1C: No results found for: HGBA1C   Assessment/Plan  1. Cough -persistent cough for ~2 months  - DG Chest 2 View r/o abnormality - CBC with Differential/Platelets - omeprazole (PRILOSEC) 20 MG capsule; Take 1 capsule (20 mg total) by mouth daily.  Dispense: 30 capsule; Refill: 3- possible GERD causing  -also recommended Claritin 10 mg by mouth daily? Allergy related  - HYDROcodone-homatropine (HYCODAN) 5-1.5 MG/5ML syrup; Take 5 mLs by mouth every 6 (six) hours as needed for cough.  Dispense: 120 mL; Refill: 0 for severe symptoms which are keeping her up at night  -also taking losartan which could be cause but blood pressure elevated today therefor will consider medication change if cough persist despite other changes.   2. Hypertension -elevated during visit however she did not get much sleep last night and coughing throughout visit. States [facility] has been taking bp and it was running 130/80s will cont to have them monitor and notify if staying elevated.   Carlos American. Harle Battiest  [facility] [phone] 8 am - 5 pm) [phone] (after hours)

## 2017-08-23 ENCOUNTER — Other Ambulatory Visit: Payer: Self-pay | Admitting: Internal Medicine

## 2017-08-23 DIAGNOSIS — E2839 Other primary ovarian failure: Secondary | ICD-10-CM

## 2017-08-29 ENCOUNTER — Other Ambulatory Visit: Payer: Self-pay | Admitting: *Deleted

## 2017-08-29 MED ORDER — METOPROLOL SUCCINATE ER 50 MG PO TB24: ORAL_TABLET | ORAL | 1 refills | Status: DC

## 2017-08-29 MED ORDER — SIMVASTATIN 40 MG PO TABS: ORAL_TABLET | ORAL | 1 refills | Status: DC

## 2017-08-29 NOTE — Telephone Encounter (Signed)
[employer].

## 2017-10-02 ENCOUNTER — Non-Acute Institutional Stay: Payer: Medicare Other

## 2017-10-02 VITALS — BP 132/54 | HR 60 | Temp 97.8°F | Ht 64.0 in | Wt 145.0 lb

## 2017-10-02 DIAGNOSIS — Z0000 Encounter for general adult medical examination without abnormal findings: Secondary | ICD-10-CM | POA: Diagnosis not present

## 2017-10-02 NOTE — Progress Notes (Signed)
Subjective:   Rebekah Graham is a 84 y.o. female who presents for Medicare Annual (Subsequent) preventive examination at [facility]  Last AWV-10/04/2016    Objective:     Vitals: BP (!) 132/54 (BP Location: Left Arm, Patient Position: Sitting)   Pulse 60   Temp 97.8 F (36.6 C) (Oral)   Ht 5\' 4"  (1.626 m)   Wt 145 lb (65.8 kg)   SpO2 98%   BMI 24.89 kg/m   Body mass index is 24.89 kg/m.  Advanced Directives 10/02/2017 08/03/2017 04/04/2017 11/02/2016 04/05/2016 10/06/2015 07/27/2015  Does Patient Have a Medical Advance Directive? Yes Yes Yes Yes Yes Yes Yes  Type of Paramedic of [place];Out of facility DNR (pink MOST or yellow form) [place];Out of facility DNR (pink MOST or yellow form) [place];Out of facility DNR (pink MOST or yellow form) Out of facility DNR (pink MOST or yellow form);Healthcare Power of [employer] of facility DNR (pink MOST or yellow form);Healthcare Power of [place];Out of facility DNR (pink MOST or yellow form) [place];Out of facility DNR (pink MOST or yellow form)  Does patient want to make changes to medical advance directive? No - Patient declined - - - - - -  Copy of [place] in Chart? Yes Yes Yes Yes Yes Yes Yes  Pre-existing out of facility DNR order (yellow form or pink MOST form) Yellow form placed in chart (order not valid for inpatient use);Pink MOST form placed in chart (order not valid for inpatient use) Yellow form placed in chart (order not valid for inpatient use) Yellow form placed in chart (order not valid for inpatient use) Yellow form placed in chart (order not valid for inpatient use) Yellow form placed in chart (order not valid for inpatient use) Yellow form placed in chart (order not valid for inpatient use) Yellow form placed in chart (order not valid for inpatient use)     Tobacco Social History   Tobacco Use  Smoking Status Never Smoker  Smokeless Tobacco Never Used  Tobacco Comment   tobacco  - none     Counseling given: Not Answered Comment: tobacco  - none   Clinical Intake:  Pre-visit preparation completed: No  Pain : No/denies pain     Nutritional Risks: None Diabetes: No  How often do you need to have someone help you when you read instructions, pamphlets, or other written materials from your doctor or pharmacy?: 1 - Never What is the last grade level you completed in school?: Masters  Interpreter Needed?: No  Information entered by :: Tyson Dense, RN  Past Medical History:  Diagnosis Date  . Actinic keratosis 09/23/2012  . 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 Arthralgia of temporomandibular joint 12/30/2009  . Atrial fibrillation ([place])   . Atrial tachycardia ([place])    s/p ablation 2008. [facility] (R free wall Atach)  . Atrophy of vulva 09/23/2012  . Blepharochalasis 03/01/2011  . Cardiac dysrhythmia, unspecified 11/10/2009  . Dermatophytosis of nail 02/28/2008  . Dizziness and giddiness 04/03/2007  . Dysphagia, unspecified(787.20) 01/17/2012  . Edema 11/21/2006  . External hemorrhoids without mention of complication 42/59/5638  . HTN (hypertension)   . Hyperlipidemia   . Insomnia, unspecified 11/18/2004  . Internal hemorrhoids without mention of complication 7/56/4332  . Left bundle branch hemiblock   . Loss of weight 03/05/2012  . Myalgia and myositis, unspecified 01/05/2009  . Orthostatic hypotension 03/22/2007  .  Other malaise and fatigue 03/05/2012  . Pain in joint, site unspecified 02/28/2008  . Palpitations 11/21/2006  . Personality change due to conditions classified elsewhere   . Rectocele 09/23/2012  . Restless legs syndrome (RLS)   . Rosacea 09/23/2012  . Sebaceous cyst 03/01/2011  . Thumb pain 11/20/2012  . Unspecified hereditary and idiopathic peripheral neuropathy    Past Surgical History:  Procedure Laterality Date  .  BLADDER SUSPENSION  1998  . CATARACT EXTRACTION W/ INTRAOCULAR LENS IMPLANT Right 4/8//2008   Dr. Charise Killian  . catheter ablation  2008   for atrial tachycardia  . CYST EXCISION  1998   mucus cyst removal from finger  . TOTAL ABDOMINAL HYSTERECTOMY  2001   uterine bleeding   Family History  Problem Relation Age of Onset  . Heart disease Mother   . Heart disease Father   . Cancer Sister        breast   Social History   Socioeconomic History  . Marital status: Widowed    Spouse name: None  . Number of children: None  . Years of education: None  . Highest education level: None  Social Needs  . Financial resource strain: Not hard at all  . Food insecurity - worry: Never true  . Food insecurity - inability: Never true  . Transportation needs - medical: No  . Transportation needs - non-medical: No  Occupational History  . Occupation: Retired Product manager: [employer]  Tobacco Use  . Smoking status: Never Smoker  . Smokeless tobacco: Never Used  . Tobacco comment: tobacco  - none  Substance and Sexual Activity  . Alcohol use: Yes    Comment: 1-2 glasses of wine/gin daily   . Drug use: No  . Sexual activity: No  Other Topics Concern  . 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 CARTIA XT 240 MG 24 hr capsule Take 240 mg by mouth daily.   . cholecalciferol (VITAMIN D) 1000 UNITS tablet Take 1,000 Units by mouth daily.  . diclofenac sodium (VOLTAREN) 1 % GEL Apply 2 g topically 4 (four) times daily.  Marland Kitchen doxycycline (VIBRA-TABS) 100 MG tablet Take 1 tablet (100 mg total) by mouth 2 (two) times daily.  Marland Kitchen HYDROcodone-homatropine (HYCODAN) 5-1.5 MG/5ML syrup Take 5 mLs by mouth every 6 (six) hours as needed for cough.   . losartan (COZAAR) 50 MG tablet Take 50 mg by mouth daily.  . metoprolol succinate (TOPROL-XL) 50 MG 24 hr tablet Take one tablet by mouth twice daily for heart rhythm.Take with or immediately following a meal.  . mirtazapine (REMERON) 7.5 MG tablet Take 1 tablet (7.5 mg total) by mouth at bedtime.  . Misc Natural Products (TURMERIC CURCUMIN) CAPS Take 1 capsule by mouth daily.    . Multiple Vitamins-Minerals (PRESERVISION AREDS PO) Take 1 tablet by mouth daily.   Marland Kitchen omeprazole (PRILOSEC) 20 MG capsule Take 1 capsule (20 mg total) by mouth daily.  . simvastatin (ZOCOR) 40 MG tablet take 1 tablet by mouth once daily TO LOWER CHOLESTROL  . XARELTO 20 MG TABS tablet take 1 tablet by mouth once daily WITH DINNER   No facility-administered encounter medications on file  as of 10/02/2017.     Activities of Daily Living In your present state of health, do you have any difficulty performing the following activities: 10/02/2017 10/04/2016  Hearing? N N  Vision? 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 Y N  Walking or climbing stairs? N N  Dressing or bathing? N N  Doing errands, shopping? N N  Preparing Food and eating ? N N  Using the Toilet? N N  In the past six months, have you accidently leaked urine? Y Y  Comment - always  Do you have problems with loss of bowel control? Y N  Managing your Medications? N N  Managing your Finances? N N  Housekeeping or managing your Housekeeping? N N  Some recent data might be hidden    Patient Care Team: Gayland Curry, DO as PCP - General (Geriatric Medicine) Community, [facility], [place] Donovan, NP as Nurse Practitioner (Geriatric Medicine) Adrian Prows, MD as Consulting Physician (Cardiology) Earlean Polka, MD as Consulting Physician (Ophthalmology)    Assessment:   This is a routine wellness examination for Rebekah Graham.  Exercise Activities and Dietary recommendations Current Exercise  Habits: Home exercise routine, Type of exercise: Other - see comments(aeorbics, WS classes), Time (Minutes): 60, Frequency (Times/Week): 7, Weekly Exercise (Minutes/Week): 420, Intensity: Mild  Goals    None      Fall Risk Fall Risk  10/02/2017 08/03/2017 04/04/2017 11/02/2016 10/04/2016  Falls in the past year? No No No Yes No  Number falls in past yr: - - - 1 -  Injury with Fall? - - - No -  Comment - - - - -  Risk for fall due to : - - - - -  Follow up - - - - -   Is the patient's home free of loose throw rugs in walkways, pet beds, electrical cords, etc?   yes      Grab bars in the bathroom? yes      Handrails on the stairs?   yes      Adequate lighting?   yes   Depression Screen PHQ 2/9 Scores 10/02/2017 04/04/2017 10/04/2016 10/06/2015  PHQ - 2 Score 0 0 0 0     Cognitive Function MMSE - Mini Mental State Exam 10/02/2017 10/04/2016 10/06/2015 10/06/2014  Orientation to time 5 5 4 5   Orientation to Place 5 5 5 5   Registration 3 3 3 3   Attention/ Calculation 5 5 5 5   Recall 3 3 3 3   Language- name 2 objects 2 2 2 2   Language- repeat 1 1 1 1   Language- follow 3 step command 3 3 3 3   Language- read & follow direction 1 1 1 1   Write a sentence 1 1 1 1   Copy design 1 1 1 1   Total score 30 30 29 30         Immunization History  Administered Date(s) Administered  . Influenza Whole 06/12/2012  . Influenza,inj,Quad PF,6+ Mos 05/13/2013  . Influenza-Unspecified 05/27/2014, 04/24/2015, 04/13/2016, 04/24/2017  . Pneumococcal Conjugate-13 05/27/2014  . Pneumococcal Polysaccharide-23 08/14/1997  . Td 04/15/2011  . Tdap 07/15/2015  . Zoster 08/15/2007  . Zoster Recombinat (Shingrix) 02/12/2017    Qualifies for Shingles Vaccine? No, up to date  Screening Tests Health Maintenance  Topic Date Due  . TETANUS/TDAP  07/14/2025  . INFLUENZA VACCINE  Completed  . DEXA SCAN  Completed  . PNA vac Low Risk Adult  Completed  Cancer Screenings: Lung: Low Dose CT Chest recommended  if Age 10-80 years, 30 pack-year currently smoking OR have quit w/in 15years. Patient does not qualify. Breast:  Up to date on Mammogram? Yes   Up to date of Bone Density/Dexa? Yes Colorectal: up to date  Additional Screenings:  Hepatitis B/HIV/Syphillis: not indicated Hepatitis C Screening: declined     Plan:    I have personally reviewed and addressed the Medicare Annual Wellness questionnaire and have noted the following in the patient's chart:  A. Medical and social history B. Use of alcohol, tobacco or illicit drugs  C. Current medications and supplements D. Functional ability and status E.  Nutritional status F.  Physical activity G. Advance directives H. List of other physicians I.  Hospitalizations, surgeries, and ER visits in previous 12 months J.  [place] to include hearing, vision, cognitive, depression L. Referrals and appointments - none  In addition, I have reviewed and discussed with patient certain preventive protocols, quality metrics, and best practice recommendations. A written personalized care plan for preventive services as well as general preventive health recommendations were provided to patient.  See attached scanned questionnaire for additional information.   Signed,   Tyson Dense, RN Nurse Health Advisor  Patient Concerns: Would like wax removed from ears

## 2017-10-02 NOTE — Patient Instructions (Signed)
Rebekah Graham , Thank you for taking time to come for your Medicare Wellness Visit. I appreciate your ongoing commitment to your health goals. Please review the following plan we discussed and let me know if I can assist you in the future.   Screening recommendations/referrals: Colonoscopy excluded, you are over age 82 Mammogram excluded, you are over age 57 Bone Density up to date, due 04/25/2018 Recommended yearly ophthalmology/optometry visit for glaucoma screening and checkup Recommended yearly dental visit for hygiene and checkup  Vaccinations: Influenza vaccine up to date, due 2019 fall season Pneumococcal vaccine up to date Tdap vaccine up to date, due 07/14/2025 Shingles vaccine up to date  Advanced directives: In chart  Conditions/risks identified: none  Next appointment: Dr. Mariea Clonts 10/10/2017 @ 10am   Preventive Care 65 Years and Older, Female Preventive care refers to lifestyle choices and visits with your health care provider that can promote health and wellness. What does preventive care include?  A yearly physical exam. This is also called an annual well check.  Dental exams once or twice a year.  Routine eye exams. Ask your health care provider how often you should have your eyes checked.  Personal lifestyle choices, including:  Daily care of your teeth and gums.  Regular physical activity.  Eating a healthy diet.  Avoiding tobacco and drug use.  Limiting alcohol use.  Practicing safe sex.  Taking low-dose aspirin every day.  Taking vitamin and mineral supplements as recommended by your health care provider. What happens during an annual well check? The services and screenings done by your health care provider during your annual well check will depend on your age, overall health, lifestyle risk factors, and family history of disease. Counseling  Your health care provider may ask you questions about your:  Alcohol use.  Tobacco use.  Drug  use.  Emotional well-being.  Home and relationship well-being.  Sexual activity.  Eating habits.  History of falls.  Memory and ability to understand (cognition).  Work and work Statistician.  Reproductive health. Screening  You may have the following tests or measurements:  Height, weight, and BMI.  Blood pressure.  Lipid and cholesterol levels. These may be checked every 5 years, or more frequently if you are over 80 years old.  Skin check.  Lung cancer screening. You may have this screening every year starting at age 76 if you have a 30-pack-year history of smoking and currently smoke or have quit within the past 15 years.  Fecal occult blood test (FOBT) of the stool. You may have this test every year starting at age 44.  Flexible sigmoidoscopy or colonoscopy. You may have a sigmoidoscopy every 5 years or a colonoscopy every 10 years starting at age 48.  Hepatitis C blood test.  Hepatitis B blood test.  Sexually transmitted disease (STD) testing.  Diabetes screening. This is done by checking your blood sugar (glucose) after you have not eaten for a while (fasting). You may have this done every 1-3 years.  Bone density scan. This is done to screen for osteoporosis. You may have this done starting at age 70.  Mammogram. This may be done every 1-2 years. Talk to your health care provider about how often you should have regular mammograms. Talk with your health care provider about your test results, treatment options, and if necessary, the need for more tests. Vaccines  Your health care provider may recommend certain vaccines, such as:  Influenza vaccine. This is recommended every year.  Tetanus, diphtheria, and  acellular pertussis (Tdap, Td) vaccine. You may need a Td booster every 10 years.  Zoster vaccine. You may need this after age 58.  Pneumococcal 13-valent conjugate (PCV13) vaccine. One dose is recommended after age 59.  Pneumococcal polysaccharide  (PPSV23) vaccine. One dose is recommended after age 13. Talk to your health care provider about which screenings and vaccines you need and how often you need them. This information is not intended to replace advice given to you by your health care provider. Make sure you discuss any questions you have with your health care provider. Document Released: 08/27/2015 Document Revised: 04/19/2016 Document Reviewed: 06/01/2015 Elsevier Interactive Patient Education  2017 [place] Prevention in the Home Falls can cause injuries. They can happen to people of all ages. There are many things you can do to make your home safe and to help prevent falls. What can I do on the outside of my home?  Regularly fix the edges of walkways and driveways and fix any cracks.  Remove anything that might make you trip as you walk through a door, such as a raised step or threshold.  Trim any bushes or trees on the path to your home.  Use bright outdoor lighting.  Clear any walking paths of anything that might make someone trip, such as rocks or tools.  Regularly check to see if handrails are loose or broken. Make sure that both sides of any steps have handrails.  Any raised decks and porches should have guardrails on the edges.  Have any leaves, snow, or ice cleared regularly.  Use sand or salt on walking paths during winter.  Clean up any spills in your garage right away. This includes oil or grease spills. What can I do in the bathroom?  Use night lights.  Install grab bars by the toilet and in the tub and shower. Do not use towel bars as grab bars.  Use non-skid mats or decals in the tub or shower.  If you need to sit down in the shower, use a plastic, non-slip stool.  Keep the floor dry. Clean up any water that spills on the floor as soon as it happens.  Remove soap buildup in the tub or shower regularly.  Attach bath mats securely with double-sided non-slip rug tape.  Do not have  throw rugs and other things on the floor that can make you trip. What can I do in the bedroom?  Use night lights.  Make sure that you have a light by your bed that is easy to reach.  Do not use any sheets or blankets that are too big for your bed. They should not hang down onto the floor.  Have a firm chair that has side arms. You can use this for support while you get dressed.  Do not have throw rugs and other things on the floor that can make you trip. What can I do in the kitchen?  Clean up any spills right away.  Avoid walking on wet floors.  Keep items that you use a lot in easy-to-reach places.  If you need to reach something above you, use a strong step stool that has a grab bar.  Keep electrical cords out of the way.  Do not use floor polish or wax that makes floors slippery. If you must use wax, use non-skid floor wax.  Do not have throw rugs and other things on the floor that can make you trip. What can I do with my stairs?  Do not leave any items on the stairs.  Make sure that there are handrails on both sides of the stairs and use them. Fix handrails that are broken or loose. Make sure that handrails are as long as the stairways.  Check any carpeting to make sure that it is firmly attached to the stairs. Fix any carpet that is loose or worn.  Avoid having throw rugs at the top or bottom of the stairs. If you do have throw rugs, attach them to the floor with carpet tape.  Make sure that you have a light switch at the top of the stairs and the bottom of the stairs. If you do not have them, ask someone to add them for you. What else can I do to help prevent falls?  Wear shoes that:  Do not have high heels.  Have rubber bottoms.  Are comfortable and fit you well.  Are closed at the toe. Do not wear sandals.  If you use a stepladder:  Make sure that it is fully opened. Do not climb a closed stepladder.  Make sure that both sides of the stepladder are  locked into place.  Ask someone to hold it for you, if possible.  Clearly mark and make sure that you can see:  Any grab bars or handrails.  First and last steps.  Where the edge of each step is.  Use tools that help you move around (mobility aids) if they are needed. These include:  Canes.  Walkers.  Scooters.  Crutches.  Turn on the lights when you go into a dark area. Replace any light bulbs as soon as they burn out.  Set up your furniture so you have a clear path. Avoid moving your furniture around.  If any of your floors are uneven, fix them.  If there are any pets around you, be aware of where they are.  Review your medicines with your doctor. Some medicines can make you feel dizzy. This can increase your chance of falling. Ask your doctor what other things that you can do to help prevent falls. This information is not intended to replace advice given to you by your health care provider. Make sure you discuss any questions you have with your health care provider. Document Released: 05/27/2009 Document Revised: 01/06/2016 Document Reviewed: 09/04/2014 Elsevier Interactive Patient Education  2017 [employer].

## 2017-10-10 ENCOUNTER — Non-Acute Institutional Stay: Payer: Medicare Other | Admitting: Internal Medicine

## 2017-10-10 ENCOUNTER — Encounter: Payer: Self-pay | Admitting: Internal Medicine

## 2017-10-10 VITALS — BP 140/80 | HR 60 | Temp 98.5°F | Wt 145.0 lb

## 2017-10-10 DIAGNOSIS — Z0000 Encounter for general adult medical examination without abnormal findings: Secondary | ICD-10-CM

## 2017-10-10 DIAGNOSIS — L821 Other seborrheic keratosis: Secondary | ICD-10-CM

## 2017-10-10 DIAGNOSIS — R05 Cough: Secondary | ICD-10-CM

## 2017-10-10 DIAGNOSIS — H353190 Nonexudative age-related macular degeneration, unspecified eye, stage unspecified: Secondary | ICD-10-CM

## 2017-10-10 DIAGNOSIS — R059 Cough, unspecified: Secondary | ICD-10-CM

## 2017-10-10 DIAGNOSIS — E7800 Pure hypercholesterolemia, unspecified: Secondary | ICD-10-CM

## 2017-10-10 DIAGNOSIS — I10 Essential (primary) hypertension: Secondary | ICD-10-CM | POA: Diagnosis not present

## 2017-10-10 DIAGNOSIS — I480 Paroxysmal atrial fibrillation: Secondary | ICD-10-CM | POA: Diagnosis not present

## 2017-10-10 DIAGNOSIS — H6123 Impacted cerumen, bilateral: Secondary | ICD-10-CM

## 2017-10-10 NOTE — Progress Notes (Signed)
Provider:  Rexene Edison. Mariea Clonts, D.O., C.M.D. Location: Occupational psychologist of Service:  Clinic (12)   PCP: Gayland Curry, DO Patient Care Team: Gayland Curry, DO as PCP - General (Geriatric Medicine) [facility] [place], [place] Donovan, NP as Nurse Practitioner (Geriatric Medicine) Adrian Prows, MD as Consulting Physician (Cardiology) Earlean Polka, MD as Consulting Physician (Ophthalmology)  Extended Emergency Contact Information Primary Emergency Contact: [name] of [place] Phone: [phone] Work Phone: [phone] Relation: Son  Code Status: DNR Goals of Care: Advanced Directive information Advanced Directives 10/10/2017  Does Patient Have a Medical Advance Directive? Yes  Type of Paramedic of [place];Out of facility DNR (pink MOST or yellow form)  Does patient want to make changes to medical advance directive? No - Patient declined  Copy of [place] in Chart? Yes  Pre-existing out of facility DNR order (yellow form or pink MOST form) Yellow form placed in chart (order not valid for inpatient use)     Chief Complaint  Patient presents with  . Annual Exam    CPE  . ACP    HCPOA, DNR    HPI: Patient is a 84 y.o. female seen today for an annual comprehensive examination.   Her HCPOA and DNR are on file and in epic.    Her husband passed away in 28-Jul-2023 of last year.  She's sleeping ok. Needed the remeron for 2 days after her husband's death.  She continues to keep busy.  She is trying to sort out his estate.  It's time consuming and having to look for paperwork not needed in years.  She says people have invited her for meals.  Still participating in [facility].  New session begins in March.    She has a rash under her left breast that she put milk of magnesia on b/c she heard it helped.    Son, Richardson Landry has moved to [facility] permanently to the summer home where  they had lived part time.    She really misses him.  He's the nurturer of her children.    She's had no difficulties with her heart:  No chest pain.  Gets sob sooner than she thinks she should when walking briskly.  Rate has been controlled.  EKG today with atrial flutter at 60bpm.  She's no longer hearing her heartbeat.   No difficulty with gerd.  On prilosec.    Cough better, too.  Gone at the moment.    Had a meal with someone recently who came down with flu A.  She's watching herself.  Joints doing fine.  Uses the voltaren more as needed on 3rd and 4th fingers on right hand--does use it at bedtime sometimes.  Thinks she has the generic.    Not seeing well.  Has to make an eye appt with Dr. Nori Riis he macular degeneration is worsening--has to put reading glasses over her regular ones for small print or if dark.  It's a nuisance.  Past Medical History:  Diagnosis Date  . Actinic keratosis 09/23/2012  . 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 Arthralgia of temporomandibular joint 12/30/2009  . Atrial fibrillation ([place])   . Atrial tachycardia ([place])    s/p ablation 2008. [facility] (R free wall Atach)  . Atrophy of vulva 09/23/2012  . Blepharochalasis 03/01/2011  . Cardiac dysrhythmia, unspecified 11/10/2009  . Dermatophytosis of nail 02/28/2008  . Dizziness and giddiness 04/03/2007  . Dysphagia, unspecified(787.20) 01/17/2012  .  Edema 11/21/2006  . External hemorrhoids without mention of complication 99/83/3825  . HTN (hypertension)   . Hyperlipidemia   . Insomnia, unspecified 11/18/2004  . Internal hemorrhoids without mention of complication 0/53/9767  . Left bundle branch hemiblock   . Loss of weight 03/05/2012  . Myalgia and myositis, unspecified 01/05/2009  . Orthostatic hypotension 03/22/2007  . Other malaise and fatigue 03/05/2012  . Pain in joint, site unspecified 02/28/2008  . Palpitations 11/21/2006  . Personality change due to conditions classified elsewhere   . Rectocele 09/23/2012  .  Restless legs syndrome (RLS)   . Rosacea 09/23/2012  . Sebaceous cyst 03/01/2011  . Thumb pain 11/20/2012  . Unspecified hereditary and idiopathic peripheral neuropathy    Past Surgical History:  Procedure Laterality Date  . BLADDER SUSPENSION  1998  . CATARACT EXTRACTION W/ INTRAOCULAR LENS IMPLANT Right 4/8//2008   Dr. Charise Killian  . catheter ablation  2008   for atrial tachycardia  . CYST EXCISION  1998   mucus cyst removal from finger  . TOTAL ABDOMINAL HYSTERECTOMY  2001   uterine bleeding    reports that  has never smoked. she has never used smokeless tobacco. She reports that she drinks alcohol. She reports that she does not use drugs. Social History   Socioeconomic History  . Marital status: Widowed    Spouse name: None  . Number of children: None  . Years of education: None  . Highest education level: None  Social Needs  . Financial resource strain: Not hard at all  . Food insecurity - worry: Never true  . Food insecurity - inability: Never true  . Transportation needs - medical: No  . Transportation needs - non-medical: No  Occupational History  . Occupation: Retired Product manager: [employer]  Tobacco Use  . Smoking status: Never Smoker  . Smokeless tobacco: Never Used  . Tobacco comment: tobacco  - none  Substance and Sexual Activity  . Alcohol use: Yes    Comment: 1-2 glasses of wine/gin daily   . Drug use: No  . Sexual activity: No  Other Topics Concern  . 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 Heart disease Mother   . Heart disease Father   . Cancer Sister        breast    Pertinent  Health Maintenance Due  Topic Date Due  . INFLUENZA VACCINE  Completed  . DEXA SCAN  Completed  . PNA vac Low Risk Adult   Completed   Fall Risk  10/02/2017 08/03/2017 04/04/2017 11/02/2016 10/04/2016  Falls in the past year? No No No Yes No  Number falls in past yr: - - - 1 -  Injury with Fall? - - - No -  Comment - - - - -  Risk for fall due to : - - - - -  Follow up - - - - -   Depression screen [facility] 2/9 10/02/2017 04/04/2017 10/04/2016 10/06/2015 04/07/2015  Decreased Interest 0 0 0 0 0  Down, Depressed, Hopeless 0 0 0 0 0  PHQ - 2 Score 0 0 0 0 0    Functional Status Survey:    Allergies  Allergen Reactions  .  Erythromycin Nausea And Vomiting  . Flecainide Acetate     Postural hypotension  . Penicillins   . Simvastatin     Pains in arms and legs    Outpatient Encounter Medications as of 10/10/2017  Medication Sig  . CARTIA XT 240 MG 24 hr capsule Take 240 mg by mouth daily.   . cholecalciferol (VITAMIN D) 1000 UNITS tablet Take 1,000 Units by mouth daily.  . diclofenac sodium (VOLTAREN) 1 % GEL Apply 2 g topically 4 (four) times daily.  Marland Kitchen losartan (COZAAR) 50 MG tablet Take 50 mg by mouth daily.  . metoprolol succinate (TOPROL-XL) 50 MG 24 hr tablet Take one tablet by mouth twice daily for heart rhythm.Take with or immediately following a meal.  . Misc Natural Products (TURMERIC CURCUMIN) CAPS Take 1 capsule by mouth daily.    . Multiple Vitamins-Minerals (PRESERVISION AREDS PO) Take 1 tablet by mouth daily.   Marland Kitchen omeprazole (PRILOSEC) 20 MG capsule Take 1 capsule (20 mg total) by mouth daily.  . simvastatin (ZOCOR) 40 MG tablet take 1 tablet by mouth once daily TO LOWER CHOLESTROL  . XARELTO 20 MG TABS tablet take 1 tablet by mouth once daily WITH DINNER  . [DISCONTINUED] doxycycline (VIBRA-TABS) 100 MG tablet Take 1 tablet (100 mg total) by mouth 2 (two) times daily.  . [DISCONTINUED] HYDROcodone-homatropine (HYCODAN) 5-1.5 MG/5ML syrup Take 5 mLs by mouth every 6 (six) hours as needed for cough.  . [DISCONTINUED] mirtazapine (REMERON) 7.5 MG tablet Take 1 tablet (7.5 mg total) by mouth at bedtime.     No facility-administered encounter medications on file as of 10/10/2017.     Review of Systems  Constitutional: Negative for activity change, appetite change, fatigue, fever and unexpected weight change.  HENT: Negative for congestion, hearing loss and trouble swallowing.   Eyes: Positive for redness and visual disturbance. Negative for photophobia, pain, discharge and itching.       Glasses, vision declining  Respiratory: Negative for cough, chest tightness, shortness of breath and wheezing.   Cardiovascular: Negative for chest pain and leg swelling.  Gastrointestinal: Negative for abdominal pain, blood in stool, constipation, diarrhea, nausea and vomiting.  Endocrine: Negative for polydipsia, polyphagia and polyuria.  Genitourinary: Positive for urgency. Negative for dysuria.       Some leakage with urgency at times but no all-out incontinence  Musculoskeletal: Positive for arthralgias. Negative for back pain, gait problem and joint swelling.  Skin: Negative for color change.  Neurological: Positive for numbness. Negative for dizziness and weakness.  Psychiatric/Behavioral: The patient is not nervous/anxious.        Very mild short-term memory loss    Vitals:   10/10/17 0951  BP: 140/80  Pulse: 60  Temp: 98.5 F (36.9 C)  TempSrc: Oral  SpO2: 99%  Weight: 145 lb (65.8 kg)   Body mass index is 24.89 kg/m. Physical Exam  Constitutional: She is oriented to person, place, and time. She appears well-developed and well-nourished. No distress.  HENT:  Head: Normocephalic and atraumatic.  Left Ear: External ear normal.  Nose: Nose normal.  Mouth/Throat: Oropharynx is clear and moist. No oropharyngeal exudate.  Increased cerumen, but TMs now visible since debrox use (for flushing tomorrow in [facility])  Eyes: EOM are normal. Pupils are equal, round, and reactive to light.  eyrthematous conjunctiva and decreased close vision and in dark  Neck: Neck supple. No JVD present.   Cardiovascular:  irreg irreg  Pulmonary/Chest: Effort normal and breath sounds normal. No respiratory distress.  Abdominal: Soft. Bowel sounds are normal. She exhibits no distension. There is no tenderness.  Musculoskeletal: Normal range of motion.  Slightly stooped posture when walking; no assistive device, diminished sensation in toes  Lymphadenopathy:    She has no cervical adenopathy.  Neurological: She is alert and oriented to person, place, and time. No cranial nerve deficit.  Skin: Skin is warm and dry.  Stuck on appearing papules beneath breasts and on abdomen  Psychiatric: She has a normal mood and affect. Her behavior is normal. Judgment and thought content normal.    Labs reviewed: Basic Metabolic Panel: No results for input(s): NA, K, CL, CO2, GLUCOSE, BUN, CREATININE, CALCIUM, MG, PHOS in the last 8760 hours. Liver Function Tests: No results for input(s): AST, ALT, ALKPHOS, BILITOT, PROT, ALBUMIN in the last 8760 hours. No results for input(s): LIPASE, AMYLASE in the last 8760 hours. No results for input(s): AMMONIA in the last 8760 hours. CBC: Recent Labs    08/03/17 1103  WBC 7.1  NEUTROABS 4,551  HGB 12.8  HCT 38.4  MCV 93.4  PLT 412*   Cardiac Enzymes: No results for input(s): CKTOTAL, CKMB, CKMBINDEX, TROPONINI in the last 8760 hours. BNP: Invalid input(s): POCBNP No results found for: HGBA1C No results found for: TSH No results found for: VITAMINB12 No results found for: FOLATE No results found for: IRON, TIBC, FERRITIN Needs labs before next viist (I think she told cardiology she gets labs here and told us she gets them there) EKG today as below  Assessment/Plan 1. Annual physical exam -performed today and AWV reviewed  2. Paroxysmal atrial fibrillation (HCC) -rate controlled, appears to be atrial flutter rather than fib on today's EKG, clinically pt unchanged, continues on toprol xl, cardia xt, xarelto - EKG 12-Lead -f/u cbc, renal  function  3. Cough -resolved  4. Essential hypertension -bp trends a bit elevated, but has had difficulty with dizziness historically and gait unsteady from neuropathy so I'm hesitant to up her meds -cont afib meds plus losartan -needs labs  5. Pure hypercholesterolemia -cont zocor therapy -check flp  6. Bilateral hearing loss due to cerumen impaction -for flushing tomorrow with clinic nurse at [facility] after debrox for 2-3 days--improved  7. Nonexudative age-related macular degeneration, unspecified laterality, unspecified stage -worsening per pt report--she's going to try to move up her eye exam with Dr. Ellie Lunch here, also may need new Rx for reading 8. Seborrheic keratoses -beneath both breasts, non-pruritic, advised she does not need to do anything about these unless they are itchy or painful for her which they are not at present--the milk of mag is not going to help them  Labs/tests ordered:  EKG done today; cbc, cmp, flp before next visit   6 mos for med mgt  Joeann Steppe L. Emilyanne Mcgough, D.O. [facility] [address], [place] [address] Cell Phone (Mon-Fri 8am-5pm):  [phone] On Call:  [phone] & follow prompts after 5pm & weekends Office Phone:  [phone] Office Fax:  [phone]

## 2017-10-12 ENCOUNTER — Other Ambulatory Visit: Payer: Self-pay | Admitting: *Deleted

## 2017-10-12 MED ORDER — DICLOFENAC SODIUM 1 % TD GEL: 2.0000 g | Freq: Four times a day (QID) | TRANSDERMAL | 1 refills | Status: DC

## 2017-10-12 NOTE — Telephone Encounter (Signed)
Patient requested refill

## 2017-10-15 ENCOUNTER — Telehealth: Payer: Self-pay | Admitting: *Deleted

## 2017-10-15 NOTE — Telephone Encounter (Signed)
Received fax from [facility] stating patient needs prior authorization for Diclofenac Sodium 1% 100gm Initiated through CoverMyMeds.  Case EF:[id number] DVO:[id number] [id number] PCN:[id number] GRP:[id number] Went into determination. 48-72 hour turn around time. Awaiting determination.

## 2017-10-16 NOTE — Telephone Encounter (Signed)
I can only get Pennsaid Solution 1.5% or 2% to pull up. No Gel.  Which is ok and how much.  Please Advise.

## 2017-10-16 NOTE — Telephone Encounter (Signed)
Received fax from [facility] and Diclofenac Gel 1% was DENIED. Fax placed in Dr. Serafina Graham for alternative suggestion. Patient informed and would like to know an alternative. Please Advise.

## 2017-10-16 NOTE — Addendum Note (Signed)
Addended by: Rafael Bihari A on: 10/16/2017 10:38 AM   Modules accepted: Orders

## 2017-10-16 NOTE — Telephone Encounter (Signed)
Alternative is pennsaid gel.  Same directions.

## 2017-10-17 ENCOUNTER — Other Ambulatory Visit: Payer: Self-pay | Admitting: Internal Medicine

## 2017-10-17 DIAGNOSIS — M79644 Pain in right finger(s): Secondary | ICD-10-CM

## 2017-10-17 MED ORDER — DICLOFENAC SODIUM 2 % TD SOLN: 1.0000 "application " | Freq: Four times a day (QID) | TRANSDERMAL | 5 refills | Status: DC

## 2017-10-17 NOTE — Progress Notes (Signed)
Will try her on diclofenac solution topically instead of gel (pennsaid variety).

## 2017-10-17 NOTE — Telephone Encounter (Signed)
Ordered the 2% solution, generic.  Didn't realize it read that way.

## 2017-10-18 ENCOUNTER — Telehealth: Payer: Self-pay | Admitting: *Deleted

## 2017-10-18 NOTE — Telephone Encounter (Signed)
Received Prior authorization for Pennsaid 2% from [facility]. Initiated through [facility]. Went into determination. 48-72 hours. BIN/PCN: [id number] [id number] Key: [id number]

## 2017-10-19 NOTE — Telephone Encounter (Signed)
pennsaid 2% denied, please see formulary alternatives on form in your folder.

## 2017-10-23 MED ORDER — DICLOFENAC SODIUM 1 % TD GEL: 2.0000 g | Freq: Four times a day (QID) | TRANSDERMAL | 1 refills | Status: DC

## 2017-10-23 NOTE — Telephone Encounter (Signed)
Per Dr. Melanie Crazier order Diclofenac Sodium gel 1%.   Medication list updated and sent to pharmacy.

## 2017-10-23 NOTE — Addendum Note (Signed)
Addended by: Rafael Bihari A on: 10/23/2017 11:04 AM   Modules accepted: Orders

## 2017-10-23 NOTE — Telephone Encounter (Signed)
Responded to form.  Placed on your desk.

## 2018-04-09 LAB — LIPID PANEL
Cholesterol: 152 (ref 0–200)
HDL: 51 (ref 35–70)
LDL Cholesterol: 80
Triglycerides: 105 (ref 40–160)

## 2018-04-09 LAB — CBC AND DIFFERENTIAL
HCT: 43 (ref 36–46)
Hemoglobin: 14.5 (ref 12.0–16.0)
Platelets: 249 (ref 150–399)
WBC: 5

## 2018-04-09 LAB — HEPATIC FUNCTION PANEL
ALT: 13 (ref 7–35)
AST: 22 (ref 13–35)
Alkaline Phosphatase: 70 (ref 25–125)
Bilirubin, Total: 0.5

## 2018-04-09 LAB — BASIC METABOLIC PANEL
BUN: 22 — AB (ref 4–21)
Creatinine: 0.9 (ref 0.5–1.1)
Glucose: 99
Potassium: 3.9 (ref 3.4–5.3)
Sodium: 139 (ref 137–147)

## 2018-04-10 ENCOUNTER — Encounter: Payer: Self-pay | Admitting: Internal Medicine

## 2018-04-17 ENCOUNTER — Non-Acute Institutional Stay: Payer: Medicare Other | Admitting: Internal Medicine

## 2018-04-17 ENCOUNTER — Encounter: Payer: Self-pay | Admitting: Internal Medicine

## 2018-04-17 VITALS — BP 130/80 | HR 82 | Temp 98.8°F | Ht 64.0 in | Wt 148.0 lb

## 2018-04-17 DIAGNOSIS — Z7901 Long term (current) use of anticoagulants: Secondary | ICD-10-CM

## 2018-04-17 DIAGNOSIS — L568 Other specified acute skin changes due to ultraviolet radiation: Secondary | ICD-10-CM

## 2018-04-17 DIAGNOSIS — R195 Other fecal abnormalities: Secondary | ICD-10-CM

## 2018-04-17 DIAGNOSIS — I10 Essential (primary) hypertension: Secondary | ICD-10-CM | POA: Diagnosis not present

## 2018-04-17 DIAGNOSIS — I872 Venous insufficiency (chronic) (peripheral): Secondary | ICD-10-CM

## 2018-04-17 DIAGNOSIS — E7800 Pure hypercholesterolemia, unspecified: Secondary | ICD-10-CM | POA: Diagnosis not present

## 2018-04-17 DIAGNOSIS — I480 Paroxysmal atrial fibrillation: Secondary | ICD-10-CM

## 2018-04-17 NOTE — Progress Notes (Signed)
Location:  Occupational psychologist of Service:  Clinic (12)  Provider: Erendira Crabtree L. Mariea Clonts, D.O., C.M.D.  Code Status: DNR Goals of Care:  Advanced Directives 10/10/2017  Does Patient Have a Medical Advance Directive? Yes  Type of Paramedic of [place];Out of facility DNR (pink MOST or yellow form)  Does patient want to make changes to medical advance directive? No - Patient declined  Copy of [place] in Chart? Yes  Pre-existing out of facility DNR order (yellow form or pink MOST form) Yellow form placed in chart (order not valid for inpatient use)   Chief Complaint  Patient presents with  . Medical Management of Chronic Issues    6MTH FOLLOW-UP    HPI: Patient is a 84 y.o. female seen today for medical management of chronic diseases.    They had the formal burial for [name]. She got some kind of sun poisoning while at [facility] on arms, legs and back of neck.  Was given an ointment.  She does not have it with her.  Using for 2 weeks.    She will make her appt for [facility] bone density  Cut down to 1/2 metoprolol daily by Dr. Einar Gip.  She's still using up the old pills.    She is still having frequent, soft stools, not diarrhea.  Sometimes does not know it's happening. Discussed eating more fiber.  Does not eat much bread.  Does eat a lot of veggies.  Discussed metamucil or benefiber as directed.    Asks about prevagen and discussed costly and not proven to truly benefit the memory.    Asks about flu shot.    Sometimes when she walks her hip clicks--she feels it, but does not hurt or hear it, just feels.    Knee very seldom wants to give out--uses railing on stairs.    Notes mild shortness of breath in the first 100 yards--occurs occasionally, but when she slows down she's ok.    Past Medical History:  Diagnosis Date  . Actinic keratosis 09/23/2012  . 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 Arthralgia of  temporomandibular joint 12/30/2009  . Atrial fibrillation ([place])   . Atrial tachycardia ([place])    s/p ablation 2008. [facility] (R free wall Atach)  . Atrophy of vulva 09/23/2012  . Blepharochalasis 03/01/2011  . Cardiac dysrhythmia, unspecified 11/10/2009  . Dermatophytosis of nail 02/28/2008  . Dizziness and giddiness 04/03/2007  . Dysphagia, unspecified(787.20) 01/17/2012  . Edema 11/21/2006  . External hemorrhoids without mention of complication 38/93/7342  . HTN (hypertension)   . Hyperlipidemia   . Insomnia, unspecified 11/18/2004  . Internal hemorrhoids without mention of complication 8/76/8115  . Left bundle branch hemiblock   . Loss of weight 03/05/2012  . Myalgia and myositis, unspecified 01/05/2009  . Orthostatic hypotension 03/22/2007  . Other malaise and fatigue 03/05/2012  . Pain in joint, site unspecified 02/28/2008  . Palpitations 11/21/2006  . Personality change due to conditions classified elsewhere   . Rectocele 09/23/2012  . Restless legs syndrome (RLS)   . Rosacea 09/23/2012  . Sebaceous cyst 03/01/2011  . Thumb pain 11/20/2012  . Unspecified hereditary and idiopathic peripheral neuropathy     Past Surgical History:  Procedure Laterality Date  . BLADDER SUSPENSION  1998  . CATARACT EXTRACTION W/ INTRAOCULAR LENS IMPLANT Right 4/8//2008   Dr. Charise Killian  . catheter ablation  2008   for atrial tachycardia  . [place]  mucus cyst removal from finger  . TOTAL ABDOMINAL HYSTERECTOMY  2001   uterine bleeding    Allergies  Allergen Reactions  . Erythromycin Nausea And Vomiting  . Flecainide Acetate     Postural hypotension  . Penicillins   . Simvastatin     Pains in arms and legs    Outpatient Encounter Medications as of 04/17/2018  Medication Sig  . cholecalciferol (VITAMIN D) 1000 UNITS tablet Take 1,000 Units by mouth daily.  Marland Kitchen diltiazem (TIAZAC) 240 MG 24 hr capsule Take 240 mg by mouth daily.  Marland Kitchen losartan-hydrochlorothiazide (HYZAAR) 100-12.5 MG tablet Take 1 tablet by  mouth daily.  . Misc Natural Products (TURMERIC CURCUMIN) CAPS Take 1 capsule by mouth daily.    . Multiple Vitamins-Minerals (PRESERVISION AREDS PO) Take 1 tablet by mouth daily.   Marland Kitchen omeprazole (PRILOSEC) 20 MG capsule Take 1 capsule (20 mg total) by mouth daily.  . simvastatin (ZOCOR) 40 MG tablet take 1 tablet by mouth once daily TO LOWER CHOLESTROL  . XARELTO 20 MG TABS tablet take 1 tablet by mouth once daily WITH DINNER  . metoprolol succinate (TOPROL-XL) 50 MG 24 hr tablet Take one tablet by mouth twice daily for heart rhythm.Take with or immediately following a meal.  . [DISCONTINUED] CARTIA XT 240 MG 24 hr capsule Take 240 mg by mouth daily.   . [DISCONTINUED] diclofenac sodium (VOLTAREN) 1 % GEL Apply 2 g topically 4 (four) times daily.  . [DISCONTINUED] losartan (COZAAR) 50 MG tablet Take 50 mg by mouth daily.   No facility-administered encounter medications on file as of 04/17/2018.     Review of Systems:  Review of Systems  Constitutional: Negative for chills, fever and malaise/fatigue.  HENT: Positive for hearing loss. Negative for congestion.   Eyes: Negative for blurred vision.       Glasses  Respiratory: Positive for shortness of breath. Negative for cough and wheezing.   Cardiovascular: Negative for chest pain, palpitations and leg swelling.  Gastrointestinal: Negative for abdominal pain, blood in stool, constipation, diarrhea, heartburn, melena, nausea and vomiting.       Some loose BMs but not diarrhea, some fecal incontinence  Genitourinary: Negative for dysuria.  Musculoskeletal: Negative for falls and joint pain.  Skin: Negative for itching and rash.  Neurological: Negative for dizziness and loss of consciousness.  Endo/Heme/Allergies: Bruises/bleeds easily.  Psychiatric/Behavioral: Positive for memory loss. Negative for depression. The patient is not nervous/anxious and does not have insomnia.     Health Maintenance  Topic Date Due  . INFLUENZA VACCINE   03/14/2018  . TETANUS/TDAP  07/14/2025  . DEXA SCAN  Completed  . PNA vac Low Risk Adult  Completed    Physical Exam: Vitals:   04/17/18 0836  BP: 130/80  Pulse: 82  Temp: 98.8 F (37.1 C)  TempSrc: Oral  SpO2: 97%  Weight: 148 lb (67.1 kg)  Height: 5\' 4"  (1.626 m)   Body mass index is 25.4 kg/m. Physical Exam  Constitutional: She is oriented to person, place, and time. She appears well-developed and well-nourished. No distress.  HENT:  Head: Normocephalic and atraumatic.  Hearing aids  Eyes:  glasses  Cardiovascular: Normal rate, regular rhythm, normal heart sounds and intact distal pulses.  Pulmonary/Chest: Effort normal and breath sounds normal. No respiratory distress.  Abdominal: Soft. Bowel sounds are normal. She exhibits no distension. There is no tenderness.  Musculoskeletal: Normal range of motion.  Walks without assistive device  Neurological: She is alert and oriented to person, place,  and time.  Skin: Skin is warm and dry.  Multiple ecchymoses of skin on arms and legs  Psychiatric: She has a normal mood and affect.    Labs reviewed: Basic Metabolic Panel: Recent Labs    04/09/18 0600  NA 139  K 3.9  BUN 22*  CREATININE 0.9   Liver Function Tests: Recent Labs    04/09/18 0600  AST 22  ALT 13  ALKPHOS 70   No results for input(s): LIPASE, AMYLASE in the last 8760 hours. No results for input(s): AMMONIA in the last 8760 hours. CBC: Recent Labs    08/03/17 1103 04/09/18 0600  WBC 7.1 5.0  NEUTROABS 4,551  --   HGB 12.8 14.5  HCT 38.4 43  MCV 93.4  --   PLT 412* 249   Lipid Panel: Recent Labs    04/09/18 0600  CHOL 152  HDL 51  LDLCALC 80  TRIG 105   Assessment/Plan 1. Paroxysmal atrial fibrillation (HCC) -no episodes that she's aware of recently -was in sinus rhythm at Dr. Irven Shelling office and showed me her EKG -need his last note from 02/22/18 so we can update her med list properly in our system (last one I have is 5/31)  2.  Chronic anticoagulation -no major bleeding from xarelto, tolerating well, h/h wnl  3. Essential hypertension -bp at goal with current regimen, Dr. Einar Gip recently reduced her metoprolol to 1/2 tab daily per pt--need note to confirm  4. Pure hypercholesterolemia -lipids good for her age of 62 and excellent functional status -continues on zocor 40mg  and tolerating well  5. 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 Venous insufficiency of both lower extremities -not an issue at this time  7.  Loose bowel movements -continue fiber in diet, hydrate and start on a fiber supplement to bulk stools  Labs/tests ordered:  No new--just had labs Next appt:  6 mos for CPE  Lannis Lichtenwalner L. Evaleigh Mccamy, D.O. [facility] [address], [place] [address] Cell Phone (Mon-Fri 8am-5pm):  [phone] On Call:  [phone] & follow prompts after 5pm & weekends Office Phone:  [phone] Office Fax:  [phone]

## 2018-04-18 NOTE — Addendum Note (Signed)
Addended by: Despina Hidden on: 04/18/2018 04:44 PM   Modules accepted: Orders

## 2018-04-22 LAB — HM DEXA SCAN

## 2018-04-23 ENCOUNTER — Other Ambulatory Visit: Payer: Self-pay | Admitting: Internal Medicine

## 2018-04-24 ENCOUNTER — Encounter: Payer: Self-pay | Admitting: *Deleted

## 2018-04-25 ENCOUNTER — Ambulatory Visit (INDEPENDENT_AMBULATORY_CARE_PROVIDER_SITE_OTHER): Payer: Medicare Other | Admitting: Family

## 2018-04-25 ENCOUNTER — Encounter: Payer: Self-pay | Admitting: Family

## 2018-04-25 ENCOUNTER — Telehealth: Payer: Self-pay | Admitting: *Deleted

## 2018-04-25 VITALS — BP 126/84 | HR 76 | Temp 98.7°F | Ht 64.0 in | Wt 150.8 lb

## 2018-04-25 DIAGNOSIS — H1033 Unspecified acute conjunctivitis, bilateral: Secondary | ICD-10-CM | POA: Diagnosis not present

## 2018-04-25 DIAGNOSIS — J00 Acute nasopharyngitis [common cold]: Secondary | ICD-10-CM | POA: Diagnosis not present

## 2018-04-25 DIAGNOSIS — R059 Cough, unspecified: Secondary | ICD-10-CM

## 2018-04-25 DIAGNOSIS — R05 Cough: Secondary | ICD-10-CM

## 2018-04-25 MED ORDER — LORATADINE 10 MG PO TABS: 10.0000 mg | ORAL_TABLET | Freq: Every day | ORAL | 11 refills | Status: DC

## 2018-04-25 MED ORDER — BENZONATATE 100 MG PO CAPS: 100.0000 mg | ORAL_CAPSULE | Freq: Two times a day (BID) | ORAL | 0 refills | Status: DC | PRN

## 2018-04-25 MED ORDER — GENTAMICIN SULFATE 0.3 % OP SOLN: 1.0000 [drp] | Freq: Three times a day (TID) | OPHTHALMIC | 0 refills | Status: AC

## 2018-04-25 NOTE — Patient Instructions (Addendum)
1.Notify provider if running any fever or symptoms worsen.  2. Drink plenty of fluids 3. Instil Garamycin one drop to each eye three times daily X 7 days 4. Take loratadine 10 mg tablet one by mouth daily for allergies

## 2018-04-25 NOTE — Telephone Encounter (Signed)
Patient calling asking for appt to be seen for cough and cold, appt scheduled today with Dinah.

## 2018-04-28 NOTE — Progress Notes (Signed)
Provider: Dinah Ngetich FNP-C  Gayland Curry, DO  Patient Care Team: Gayland Curry, DO as PCP - General (Geriatric Medicine) [facility]ll[facility] Dyke, [place] Donovan, NP as Nurse Practitioner (Geriatric Medicine) Adrian Prows, MD as Consulting Physician (Cardiology) Earlean Polka, MD as Consulting Physician (Ophthalmology)  Extended Emergency Contact Information Primary Emergency Contact: [name] of [place] Phone: [phone] Work Phone: [phone] Relation: Son  Goals of care: Advanced Directive information Advanced Directives 10/10/2017  Does Patient Have a Medical Advance Directive? Yes  Type of Paramedic of [place];Out of facility DNR (pink MOST or yellow form)  Does patient want to make changes to medical advance directive? No - Patient declined  Copy of [place] in Chart? Yes  Pre-existing out of facility DNR order (yellow form or pink MOST form) Yellow form placed in chart (order not valid for inpatient use)     Chief Complaint  Patient presents with  . Acute Visit    Pt is being seen due to a constant cough and congestion for 3 weeks.     HPI:  Pt is a 84 y.o. female seen today at [facility] office for an acute visit for evaluation of non-productive cough,eye redness and runny nose.she states her eyes has been watery and red.Also states having post nasal drip and clear nasal drainage.she denies any fever or chills.    Past Medical History:  Diagnosis Date  . Actinic keratosis 09/23/2012  . 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 Arthralgia of temporomandibular joint 12/30/2009  . Atrial fibrillation ([place])   . Atrial tachycardia ([place])    s/p ablation 2008. [facility] (R free wall Atach)  . Atrophy of vulva 09/23/2012  . Blepharochalasis 03/01/2011  . Cardiac dysrhythmia, unspecified 11/10/2009  . Dermatophytosis of nail 02/28/2008  . Dizziness and giddiness 04/03/2007  . Dysphagia,  unspecified(787.20) 01/17/2012  . Edema 11/21/2006  . External hemorrhoids without mention of complication 46/50/3546  . HTN (hypertension)   . Hyperlipidemia   . Insomnia, unspecified 11/18/2004  . Internal hemorrhoids without mention of complication 5/68/1275  . Left bundle branch hemiblock   . Loss of weight 03/05/2012  . Myalgia and myositis, unspecified 01/05/2009  . Orthostatic hypotension 03/22/2007  . Other malaise and fatigue 03/05/2012  . Pain in joint, site unspecified 02/28/2008  . Palpitations 11/21/2006  . Personality change due to conditions classified elsewhere   . Rectocele 09/23/2012  . Restless legs syndrome (RLS)   . Rosacea 09/23/2012  . Sebaceous cyst 03/01/2011  . Thumb pain 11/20/2012  . Unspecified hereditary and idiopathic peripheral neuropathy    Past Surgical History:  Procedure Laterality Date  . BLADDER SUSPENSION  1998  . CATARACT EXTRACTION W/ INTRAOCULAR LENS IMPLANT Right 4/8//2008   Dr. Charise Killian  . catheter ablation  2008   for atrial tachycardia  . CYST EXCISION  1998   mucus cyst removal from finger  . TOTAL ABDOMINAL HYSTERECTOMY  2001   uterine bleeding    Allergies  Allergen Reactions  . Erythromycin Nausea And Vomiting  . Flecainide Acetate     Postural hypotension  . Penicillins   . Simvastatin     Pains in arms and legs    Outpatient Encounter Medications as of 04/25/2018  Medication Sig  . benzonatate (TESSALON) 100 MG capsule Take 1 capsule (100 mg total) by mouth 2 (two) times daily as needed for cough.  . Chlorpheniramine-DM (CORICIDIN HBP COUGH/COLD PO) Take by mouth. Take 1 or 2  capsules every 4 hours but not more than 12 capsules in 24 hours. OTC medication  . cholecalciferol (VITAMIN D) 1000 UNITS tablet Take 1,000 Units by mouth daily.  Marland Kitchen diltiazem (TIAZAC) 240 MG 24 hr capsule Take 240 mg by mouth daily.  Marland Kitchen losartan-hydrochlorothiazide (HYZAAR) 100-12.5 MG tablet TAKE 1 TABLET IN THE MORNING.  . metoprolol succinate (TOPROL-XL) 25 MG 24  hr tablet Take 25 mg by mouth daily.  . mirtazapine (REMERON) 7.5 MG tablet Take 7.5 mg by mouth at bedtime.  . Misc Natural Products (TURMERIC CURCUMIN) CAPS Take 1 capsule by mouth daily.    . Multiple Vitamins-Minerals (PRESERVISION AREDS PO) Take 1 tablet by mouth daily.   Marland Kitchen omeprazole (PRILOSEC) 20 MG capsule Take 1 capsule (20 mg total) by mouth daily.  . simvastatin (ZOCOR) 40 MG tablet TAKE 1 TABLET DAILY TO LOWER CHOLESTEROL.  Marland Kitchen triamcinolone cream (KENALOG) 0.1 %   . XARELTO 20 MG TABS tablet take 1 tablet by mouth once daily WITH DINNER  . [DISCONTINUED] benzonatate (TESSALON) 100 MG capsule Take 100 mg by mouth 2 (two) times daily as needed for cough.  Marland Kitchen gentamicin (GARAMYCIN) 0.3 % ophthalmic solution Place 1 drop into both eyes 3 (three) times daily for 7 days.  Marland Kitchen loratadine (CLARITIN) 10 MG tablet Take 1 tablet (10 mg total) by mouth daily for 14 days.   No facility-administered encounter medications on file as of 04/25/2018.     Review of Systems  Constitutional: Negative for chills, fatigue and fever.  HENT: Positive for postnasal drip and rhinorrhea. Negative for congestion, sinus pressure, sinus pain, sneezing and sore throat.   Eyes: Positive for discharge and redness. Negative for pain, itching and visual disturbance.  Respiratory: Positive for cough. Negative for chest tightness, shortness of breath and wheezing.   Cardiovascular: Negative for chest pain, palpitations and leg swelling.  Gastrointestinal: Negative for abdominal distention, abdominal pain, constipation, nausea and vomiting.  Skin: Negative for color change, pallor and rash.  Neurological: Negative for dizziness, light-headedness and headaches.    Immunization History  Administered Date(s) Administered  . Influenza Whole 06/12/2012  . Influenza,inj,Quad PF,6+ Mos 05/13/2013  . Influenza-Unspecified 05/27/2014, 04/24/2015, 04/13/2016, 04/24/2017  . Pneumococcal Conjugate-13 05/27/2014  . Pneumococcal  Polysaccharide-23 08/14/1997  . Td 04/15/2011  . Tdap 07/15/2015  . Zoster 08/15/2007  . Zoster Recombinat (Shingrix) 02/12/2017   Pertinent  Health Maintenance Due  Topic Date Due  . INFLUENZA VACCINE  03/14/2018  . DEXA SCAN  Completed  . PNA vac Low Risk Adult  Completed   Fall Risk  04/25/2018 04/17/2018 10/02/2017 08/03/2017 04/04/2017  Falls in the past year? No No No No No  Number falls in past yr: - - - - -  Injury with Fall? - - - - -  [place] for fall due to : - - - - -  Follow up - - - - -    Vitals:   04/25/18 1458  BP: 126/84  Pulse: 76  Temp: 98.7 F (37.1 C)  TempSrc: Oral  SpO2: 96%  Weight: 150 lb 12.8 oz (68.4 kg)  Height: 5\' 4"  (1.626 m)   Body mass index is 25.88 kg/m. Physical Exam  Constitutional: She is oriented to person, place, and time. She appears well-developed and well-nourished. No distress.  HENT:  Head: Normocephalic.  Right Ear: External ear normal.  Left Ear: External ear normal.  Nose: Nose normal.  Mouth/Throat: Oropharynx is clear and moist. No oropharyngeal  exudate.  Eyes: Pupils are equal, round, and reactive to light. EOM are normal. Right eye exhibits discharge. Right eye exhibits no hordeolum. Left eye exhibits discharge. Left eye exhibits no hordeolum. Right conjunctiva is injected. Left conjunctiva is injected. No scleral icterus.  Cardiovascular: Normal rate, regular rhythm, normal heart sounds and intact distal pulses. Exam reveals no gallop and no friction rub.  No murmur heard. Pulmonary/Chest: Effort normal and breath sounds normal. No respiratory distress. She has no wheezes. She has no rales.  Abdominal: Soft. Bowel sounds are normal. She exhibits no distension and no mass. There is no tenderness. There is no rebound and no guarding.  Musculoskeletal: Normal range of motion. She exhibits no edema or tenderness.  Neurological: She is oriented to person, place, and time.  Skin: Skin is warm and dry. No rash  noted. No erythema. No pallor.  Psychiatric: She has a normal mood and affect. Her behavior is normal. Judgment and thought content normal.    Labs reviewed: Recent Labs    04/09/18 0600  NA 139  K 3.9  BUN 22*  CREATININE 0.9   Recent Labs    04/09/18 0600  AST 22  ALT 13  ALKPHOS 70   Recent Labs    08/03/17 1103 04/09/18 0600  WBC 7.1 5.0  NEUTROABS 4,551  --   HGB 12.8 14.5  HCT 38.4 43  MCV 93.4  --   PLT 412* 249    Lab Results  Component Value Date   CHOL 152 04/09/2018   HDL 51 04/09/2018   LDLCALC 80 04/09/2018   TRIG 105 04/09/2018    Significant Diagnostic Results in last 30 days:  No results found.  Assessment/Plan 1. Acute bacterial conjunctivitis of both eyes Bilateral eye redness with yellow drainage.instil Garamycin one drop three times daily x 7 days.cleanse eyes with warm water wash clothe.Notify provider's office if no improvement or symptoms worsen.    2. Acute rhinitis Clear nasal drainage possible post nasal drip causing cough.take Loratadine 10 mg tablet by mouth once daily.   3. Cough Afebrile.Bilateral lung sounds clear to auscultation.suspect possible from post nasal drip. OTC cough syrup as needed.Loratadine 10 mg tablet daily.Notify provider's office if running any fever,chills or symptoms worsen.   Family/ staff Communication: Reviewed plan of care with patient.  Labs/tests ordered: None   Dinah C Ngetich, NP

## 2018-04-30 ENCOUNTER — Ambulatory Visit (INDEPENDENT_AMBULATORY_CARE_PROVIDER_SITE_OTHER): Payer: Medicare Other | Admitting: Internal Medicine

## 2018-04-30 ENCOUNTER — Encounter: Payer: Self-pay | Admitting: Internal Medicine

## 2018-04-30 VITALS — BP 124/78 | HR 70 | Temp 98.9°F | Ht 64.0 in | Wt 150.0 lb

## 2018-04-30 DIAGNOSIS — J309 Allergic rhinitis, unspecified: Secondary | ICD-10-CM | POA: Diagnosis not present

## 2018-04-30 DIAGNOSIS — H1033 Unspecified acute conjunctivitis, bilateral: Secondary | ICD-10-CM

## 2018-04-30 DIAGNOSIS — J40 Bronchitis, not specified as acute or chronic: Secondary | ICD-10-CM | POA: Diagnosis not present

## 2018-04-30 MED ORDER — HYDROCODONE-HOMATROPINE 5-1.5 MG/5ML PO SYRP: 5.0000 mL | ORAL_SOLUTION | Freq: Three times a day (TID) | ORAL | 0 refills | Status: DC | PRN

## 2018-04-30 MED ORDER — AZITHROMYCIN 250 MG PO TABS: ORAL_TABLET | ORAL | 0 refills | Status: DC

## 2018-04-30 MED ORDER — ALBUTEROL SULFATE HFA 108 (90 BASE) MCG/ACT IN AERS: INHALATION_SPRAY | RESPIRATORY_TRACT | 2 refills | Status: DC

## 2018-04-30 NOTE — Progress Notes (Signed)
Patient ID: Rebekah Graham, female   DOB: June 07, 1929, 84 y.o.   MRN: [id number]   [facility] OFFICE  Provider: DR Arletha Grippe  Code Status: DNR Goals of Care:  Advanced Directives 10/10/2017  Does Patient Have a Medical Advance Directive? Yes  Type of Paramedic of [place];Out of facility DNR (pink MOST or yellow form)  Does patient want to make changes to medical advance directive? No - Patient declined  Copy of [place] in Chart? Yes  Pre-existing out of facility DNR order (yellow form or pink MOST form) Yellow form placed in chart (order not valid for inpatient use)     Chief Complaint  Patient presents with  . Acute Visit    Cough, productive at times, nasal drainage x 2 weeks    HPI: Patient is a 84 y.o. female seen today for an acute visit for intermittent productive cough and nasal d/c x 2 weeks. She was seen by NP on 04/25/18 and tx for acute bacterial conjunctivitis with gent eye gtts. She was also given claritin and told to continue coricidin HBP; Rx tessalon perles. She states she is ot feeling better in relation to her cough and cold sx's. She believes her nose runs more with claritin. Cough not improved with OTC meds and appears deeper at times. She took a trip to [place] last weekend which did not help her sx's.   CP no  SOB no  N/V no  F/c no  HA no  Dizziness no  Abdominal pain no   Fatigue YES  Change in appetite YES - DECREASED  Sleep - interrupted due to cough  OTC? coricidan  Past Medical History:  Diagnosis Date  . Actinic keratosis 09/23/2012  . 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 Arthralgia of temporomandibular joint 12/30/2009  . Atrial fibrillation ([place])   . Atrial tachycardia ([place])    s/p ablation 2008. [facility] (R free wall Atach)  . Atrophy of vulva 09/23/2012  . Blepharochalasis 03/01/2011  . Cardiac dysrhythmia, unspecified 11/10/2009  . Dermatophytosis of nail 02/28/2008  . Dizziness and  giddiness 04/03/2007  . Dysphagia, unspecified(787.20) 01/17/2012  . Edema 11/21/2006  . External hemorrhoids without mention of complication 05/39/7673  . HTN (hypertension)   . Hyperlipidemia   . Insomnia, unspecified 11/18/2004  . Internal hemorrhoids without mention of complication 11/30/3788  . Left bundle branch hemiblock   . Loss of weight 03/05/2012  . Myalgia and myositis, unspecified 01/05/2009  . Orthostatic hypotension 03/22/2007  . Other malaise and fatigue 03/05/2012  . Pain in joint, site unspecified 02/28/2008  . Palpitations 11/21/2006  . Personality change due to conditions classified elsewhere   . Rectocele 09/23/2012  . Restless legs syndrome (RLS)   . Rosacea 09/23/2012  . Sebaceous cyst 03/01/2011  . Thumb pain 11/20/2012  . Unspecified hereditary and idiopathic peripheral neuropathy     Past Surgical History:  Procedure Laterality Date  . BLADDER SUSPENSION  1998  . CATARACT EXTRACTION W/ INTRAOCULAR LENS IMPLANT Right 4/8//2008   Dr. Charise Killian  . catheter ablation  2008   for atrial tachycardia  . CYST EXCISION  1998   mucus cyst removal from finger  . TOTAL ABDOMINAL HYSTERECTOMY  2001   uterine bleeding     reports that she has never smoked. She has never used smokeless tobacco. She reports that she drinks alcohol. She reports that she does not use drugs. Social History   Socioeconomic History  . Marital status: Widowed  Spouse name: Not on file  . Number of children: Not on file  . Years of education: Not on file  . Highest education level: Not on file  Occupational History  . Occupation: Retired Product manager: [employer]  . Financial resource strain: Not hard at all  . Food insecurity:    Worry: Never true    Inability: Never true  . Transportation needs:    Medical: No    Non-medical: No  Tobacco Use  . Smoking status: Never Smoker  . Smokeless tobacco: Never Used  . Tobacco comment: tobacco  - none  Substance and Sexual  Activity  . Alcohol use: Yes    Comment: 1-2 glasses of wine/gin daily   . Drug use: No  . Sexual activity: Never  Lifestyle  . Physical activity:    Days per week: 7 days    Minutes per session: 60 min  . Stress: Only a little  Relationships  . Social connections:    Talks on phone: More than three times a week    Gets together: More than three times a week    Attends religious service: More than 4 times per year    Active member of club or organization: Yes    Attends meetings of clubs or organizations: 1 to 4 times per year    Relationship status: Widowed  . Intimate partner violence:    Fear of current or ex partner: No    Emotionally abused: No    Physically abused: No    Forced sexual activity: No  Other Topics Concern  . 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 Heart disease Mother   . Heart disease Father   . Cancer Sister        breast    Allergies  Allergen Reactions  . Erythromycin Nausea And Vomiting  . Flecainide Acetate     Postural hypotension  . Penicillins   . Simvastatin     Pains in arms and legs    Outpatient Encounter Medications as of 04/30/2018  Medication Sig  . benzonatate (TESSALON) 100 MG capsule Take 1 capsule (100 mg total) by mouth 2 (two) times daily as needed for cough.  . Chlorpheniramine-DM (CORICIDIN HBP COUGH/COLD PO) Take by mouth. Take 1 or 2 capsules every 4 hours but not more than 12 capsules in 24 hours. OTC medication  . cholecalciferol (VITAMIN D) 1000 UNITS tablet Take 1,000 Units by mouth daily.  Marland Kitchen diltiazem (TIAZAC) 240 MG 24 hr capsule Take 240 mg by mouth daily.  Marland Kitchen gentamicin (GARAMYCIN) 0.3 % ophthalmic solution Place 1 drop into both eyes 3 (three) times daily for 7 days.  Marland Kitchen  losartan-hydrochlorothiazide (HYZAAR) 100-12.5 MG tablet TAKE 1 TABLET IN THE MORNING.  . metoprolol succinate (TOPROL-XL) 25 MG 24 hr tablet Take 25 mg by mouth daily.  . mirtazapine (REMERON) 7.5 MG tablet Take 7.5 mg by mouth at bedtime.  . Misc Natural Products (TURMERIC CURCUMIN) CAPS Take 1 capsule by mouth daily.    . Multiple Vitamins-Minerals (PRESERVISION AREDS PO) Take 1 tablet by mouth daily.   . simvastatin (ZOCOR) 40 MG tablet TAKE 1 TABLET  DAILY TO LOWER CHOLESTEROL.  Marland Kitchen triamcinolone cream (KENALOG) 0.1 %   . XARELTO 20 MG TABS tablet take 1 tablet by mouth once daily WITH DINNER  . [DISCONTINUED] loratadine (CLARITIN) 10 MG tablet Take 1 tablet (10 mg total) by mouth daily for 14 days. (Patient not taking: Reported on 04/30/2018)  . [DISCONTINUED] omeprazole (PRILOSEC) 20 MG capsule Take 1 capsule (20 mg total) by mouth daily. (Patient not taking: Reported on 04/30/2018)   No facility-administered encounter medications on file as of 04/30/2018.     Review of Systems:  Review of Systems  Constitutional: Positive for appetite change and fatigue.  Respiratory: Positive for cough. Negative for wheezing.   All other systems reviewed and are negative.   Health Maintenance  Topic Date Due  . INFLUENZA VACCINE  03/14/2018  . TETANUS/TDAP  07/14/2025  . DEXA SCAN  Completed  . PNA vac Low Risk Adult  Completed    Physical Exam: Vitals:   04/30/18 1131  BP: 124/78  Pulse: 70  Temp: 98.9 F (37.2 C)  SpO2: 97%  Weight: 150 lb (68 kg)  Height: 5\' 4"  (1.626 m)   Body mass index is 25.75 kg/m. Physical Exam  Constitutional: She is oriented to person, place, and time. She appears well-developed and well-nourished.  Looks ill in NAD  HENT:  Mouth/Throat: Oropharynx is clear and moist. No oropharyngeal exudate.  MMM; no oral thrush  Eyes: Pupils are equal, round, and reactive to light. Right eye exhibits no discharge. Left eye exhibits no discharge. Right conjunctiva is  injected. Right conjunctiva has no hemorrhage. Left conjunctiva is injected. No scleral icterus.  Neck: Neck supple. Muscular tenderness present. Carotid bruit is not present. No tracheal deviation present. No thyromegaly present.  Cardiovascular: Normal rate, regular rhythm and intact distal pulses. Exam reveals no gallop and no friction rub.  Murmur (1/6 SEM) heard. No LE edema b/l. no calf TTP.   Pulmonary/Chest: Effort normal. No stridor. No respiratory distress. She has wheezes (end expiratory with prolonged expiratory phase). She has no rales.  Abdominal: Normal appearance. There is no hepatomegaly. There is no rigidity.  Musculoskeletal: She exhibits edema.  Lymphadenopathy:    She has no cervical adenopathy.  Neurological: She is alert and oriented to person, place, and time. She has normal reflexes.  Skin: Skin is warm and dry. No rash noted.  Psychiatric: She has a normal mood and affect. Her behavior is normal. Judgment and thought content normal.    Labs reviewed: Basic Metabolic Panel: Recent Labs    04/09/18 0600  NA 139  K 3.9  BUN 22*  CREATININE 0.9   Liver Function Tests: Recent Labs    04/09/18 0600  AST 22  ALT 13  ALKPHOS 70   No results for input(s): LIPASE, AMYLASE in the last 8760 hours. No results for input(s): AMMONIA in the last 8760 hours. CBC: Recent Labs    08/03/17 1103 04/09/18 0600  WBC 7.1 5.0  NEUTROABS 4,551  --   HGB 12.8 14.5  HCT 38.4 43  MCV 93.4  --   PLT 412* 249   Lipid Panel: Recent Labs    04/09/18 0600  CHOL 152  HDL 51  LDLCALC 80  TRIG 105   No results found for: HGBA1C  Procedures since last visit: No results found.  Assessment/Plan   ICD-10-CM   1. Bronchitis J40 HYDROcodone-homatropine (HYCODAN) 5-1.5 MG/5ML syrup    albuterol (PROVENTIL HFA;VENTOLIN HFA) 108 (90 Base) MCG/ACT inhaler  2. Allergic rhinitis,  unspecified seasonality, unspecified trigger J30.9   3. Acute bacterial conjunctivitis of both  eyes H10.33    improving   START ZPAK AS DIRECTED FOR LUNG INFECTION  START HYCODAN 1 TSP EVERY 6 HRS AS NEEDED FOR COUGH -may cause drowsiness  START ALBUTEROL INHALER - USE 2 PUFFS 3 TIMES DAILY X 7 DAYS >>>2 TIMES DAILY X 7 DAYS >>>1 TIME DAILY X 7 DAYS AND STOP  USE PROBIOTIC DAILY X 2 WEEKS to keep colon healthy  Continue other medications as ordered - continue claritin daily and use saline nasal spray as needed to keep nose moist  No water aerobics for at least 2 weeks  Push fluids and rest  Call office if no better in next 3 days as you may need additional medication  Follow up as scheduled with Dr Mariea Clonts as scheduled or sooner if need be   [employer]. Perlie Gold  [facility] [address] [place], [place] [address] [phone] Cell (Monday-Friday 8 AM - 5 PM) [phone] After 5 PM and follow prompts

## 2018-04-30 NOTE — Patient Instructions (Addendum)
START ZPAK AS DIRECTED FOR LUNG INFECTION  START HYCODAN 1 TSP EVERY 6 HRS AS NEEDED FOR COUGH -may cause drowsiness  START ALBUTEROL INHALER - USE 2 PUFFS 3 TIMES DAILY X 7 DAYS >>>2 TIMES DAILY X 7 DAYS >>>1 TIME DAILY X 7 DAYS AND STOP  USE PROBIOTIC DAILY X 2 WEEKS to keep colon healthy  Continue other medications as ordered - continue claritin daily and use saline nasal spray as needed to keep nose moist  No water aerobics for at least 2 weeks  Push fluids and rest  Call office if no better in next 3 days as you may need additional medication  Follow up as scheduled with Dr Mariea Clonts as scheduled or sooner if need be

## 2018-10-16 ENCOUNTER — Encounter: Payer: Self-pay | Admitting: Internal Medicine

## 2018-10-22 ENCOUNTER — Other Ambulatory Visit: Payer: Self-pay | Admitting: Internal Medicine

## 2018-10-22 NOTE — Telephone Encounter (Signed)
High Risk waring populated when attempting to refill, will send to [place], DO for review and approval

## 2018-10-30 ENCOUNTER — Encounter: Payer: Self-pay | Admitting: Internal Medicine

## 2018-10-30 ENCOUNTER — Non-Acute Institutional Stay: Payer: Medicare Other | Admitting: Internal Medicine

## 2018-10-30 ENCOUNTER — Other Ambulatory Visit: Payer: Self-pay

## 2018-10-30 VITALS — BP 130/80 | HR 90 | Temp 98.7°F | Ht 65.0 in | Wt 147.0 lb

## 2018-10-30 DIAGNOSIS — I480 Paroxysmal atrial fibrillation: Secondary | ICD-10-CM | POA: Diagnosis not present

## 2018-10-30 DIAGNOSIS — H3530 Unspecified macular degeneration: Secondary | ICD-10-CM

## 2018-10-30 DIAGNOSIS — I872 Venous insufficiency (chronic) (peripheral): Secondary | ICD-10-CM

## 2018-10-30 DIAGNOSIS — Z7901 Long term (current) use of anticoagulants: Secondary | ICD-10-CM

## 2018-10-30 DIAGNOSIS — Z0000 Encounter for general adult medical examination without abnormal findings: Secondary | ICD-10-CM

## 2018-10-30 DIAGNOSIS — H6123 Impacted cerumen, bilateral: Secondary | ICD-10-CM

## 2018-10-30 DIAGNOSIS — I10 Essential (primary) hypertension: Secondary | ICD-10-CM | POA: Diagnosis not present

## 2018-10-30 DIAGNOSIS — E7800 Pure hypercholesterolemia, unspecified: Secondary | ICD-10-CM | POA: Insufficient documentation

## 2018-10-30 NOTE — Progress Notes (Addendum)
Provider:  Rexene Edison. Mariea Clonts, D.O., C.M.D. Location:  Occupational psychologist of Service:  Clinic (12)  Previous PCP: Gayland Curry, DO Patient Care Team: Gayland Curry, DO as PCP - General (Geriatric Medicine) [facility], [facility] [place], [place] Donovan, NP as Nurse Practitioner (Geriatric Medicine) Adrian Prows, MD as Consulting Physician (Cardiology) Earlean Polka, MD as Consulting Physician (Ophthalmology)  Extended Emergency Contact Information Primary Emergency Contact: [name] of [place] Phone: [phone] Work Phone: [phone] Relation: Son  Code Status: DNR Goals of Care: Advanced Directive information Advanced Directives 10/10/2017  Does Patient Have a Medical Advance Directive? Yes  Type of Paramedic of [place];Out of facility DNR (pink MOST or yellow form)  Does patient want to make changes to medical advance directive? No - Patient declined  Copy of [place] in Chart? Yes  Pre-existing out of facility DNR order (yellow form or pink MOST form) Yellow form placed in chart (order not valid for inpatient use)   Chief Complaint  Patient presents with  . Medical Management of Chronic Issues    CPE    HPI: Patient is a 85 y.o. female seen today for an annual physical exam.  She's been taking an herbal immunity supplement and zinc supplement.   No new concerns.  Feeling fine.    Follows with Dr. Einar Gip.  She was doing great when she did see him in January.  She is in sinus rhythm today and no ischemia or infarct.  Sees him again in July.  She does have cerumen impaction and requests flushing here today.  Past Medical History:  Diagnosis Date  . Actinic keratosis 09/23/2012  . 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 Arthralgia of temporomandibular joint 12/30/2009  . Atrial fibrillation ([place])   . Atrial tachycardia ([place])    s/p ablation 2008. [facility] (R free wall  Atach)  . Atrophy of vulva 09/23/2012  . Blepharochalasis 03/01/2011  . Cardiac dysrhythmia, unspecified 11/10/2009  . Dermatophytosis of nail 02/28/2008  . Dizziness and giddiness 04/03/2007  . Dysphagia, unspecified(787.20) 01/17/2012  . Edema 11/21/2006  . External hemorrhoids without mention of complication 32/20/2542  . HTN (hypertension)   . Hyperlipidemia   . Insomnia, unspecified 11/18/2004  . Internal hemorrhoids without mention of complication 02/17/2375  . Left bundle branch hemiblock   . Loss of weight 03/05/2012  . Myalgia and myositis, unspecified 01/05/2009  . Orthostatic hypotension 03/22/2007  . Other malaise and fatigue 03/05/2012  . Pain in joint, site unspecified 02/28/2008  . Palpitations 11/21/2006  . Personality change due to conditions classified elsewhere   . Rectocele 09/23/2012  . Restless legs syndrome (RLS)   . Rosacea 09/23/2012  . Sebaceous cyst 03/01/2011  . Thumb pain 11/20/2012  . Unspecified hereditary and idiopathic peripheral neuropathy    Past Surgical History:  Procedure Laterality Date  . BLADDER SUSPENSION  1998  . CATARACT EXTRACTION W/ INTRAOCULAR LENS IMPLANT Right 4/8//2008   Dr. Charise Killian  . catheter ablation  2008   for atrial tachycardia  . CYST EXCISION  1998   mucus cyst removal from finger  . TOTAL ABDOMINAL HYSTERECTOMY  2001   uterine bleeding    reports that she has never smoked. She has never used smokeless tobacco. She reports current alcohol use. She reports that she does not use drugs.  Functional Status Survey:    Family History  Problem Relation Age of Onset  . Heart disease Mother   .  Heart disease Father   . Cancer Sister        breast    Health Maintenance  Topic Date Due  . TETANUS/TDAP  07/14/2025  . INFLUENZA VACCINE  Completed  . DEXA SCAN  Completed  . PNA vac Low Risk Adult  Completed    Allergies  Allergen Reactions  . Erythromycin Nausea And Vomiting  . Flecainide Acetate     Postural hypotension  . Penicillins    . Simvastatin     Pains in arms and legs    Outpatient Encounter Medications as of 10/30/2018  Medication Sig  . cholecalciferol (VITAMIN D) 1000 UNITS tablet Take 1,000 Units by mouth daily.  Marland Kitchen diltiazem (TIAZAC) 240 MG 24 hr capsule Take 240 mg by mouth daily.  Marland Kitchen losartan-hydrochlorothiazide (HYZAAR) 100-12.5 MG tablet TAKE 1 TABLET IN THE MORNING.  . metoprolol succinate (TOPROL-XL) 25 MG 24 hr tablet Take 25 mg by mouth daily.  . mirtazapine (REMERON) 7.5 MG tablet Take 7.5 mg by mouth at bedtime.  . Misc Natural Products (TURMERIC CURCUMIN) CAPS Take 1 capsule by mouth daily.    . Multiple Vitamins-Minerals (PRESERVISION AREDS PO) Take 1 tablet by mouth daily.   . simvastatin (ZOCOR) 40 MG tablet TAKE 1 TABLET DAILY TO LOWER CHOLESTEROL.  Marland Kitchen triamcinolone cream (KENALOG) 0.1 %   . XARELTO 20 MG TABS tablet take 1 tablet by mouth once daily WITH DINNER  . [DISCONTINUED] albuterol (PROVENTIL HFA;VENTOLIN HFA) 108 (90 Base) MCG/ACT inhaler Inhale 2 puffs TID x 7 days then BID x 7 days then daily x 7 days and stop for bronchitis  . [DISCONTINUED] azithromycin (ZITHROMAX) 250 MG tablet TAKE 2 TABS PO ON DAY 1 THEN 1 TAB PO DAILY X 4 DAYS for lung infection  . [DISCONTINUED] benzonatate (TESSALON) 100 MG capsule Take 1 capsule (100 mg total) by mouth 2 (two) times daily as needed for cough.  . [DISCONTINUED] Chlorpheniramine-DM (CORICIDIN HBP COUGH/COLD PO) Take by mouth. Take 1 or 2 capsules every 4 hours but not more than 12 capsules in 24 hours. OTC medication  . [DISCONTINUED] HYDROcodone-homatropine (HYCODAN) 5-1.5 MG/5ML syrup Take 5 mLs by mouth every 8 (eight) hours as needed for cough.   No facility-administered encounter medications on file as of 10/30/2018.     Review of Systems  Constitutional: Negative for chills, fever and malaise/fatigue.  HENT: Positive for hearing loss.        Due to cerumen impaction  Eyes: Negative for blurred vision.       No changes in her macular  degeneration, wears glasses  Respiratory: Negative for cough, shortness of breath and wheezing.   Cardiovascular: Positive for palpitations. Negative for chest pain, orthopnea, leg swelling and PND.       Feels her pulse beating rapidly some nights just after she gets in bed after brushing her teeth  Gastrointestinal: Negative for abdominal pain, blood in stool, constipation, melena, nausea and vomiting.       Sometimes urgency and soft stools--using bulking fiber to help this  Genitourinary: Positive for urgency. Negative for dysuria and frequency.  Musculoskeletal: Negative for back pain, falls, joint pain and myalgias.  Skin:       Thin with easy bruising and injury  Neurological: Positive for tingling and sensory change. Negative for dizziness and loss of consciousness.       Bilateral feet with neuropathy unchanged  Endo/Heme/Allergies: Bruises/bleeds easily.  Psychiatric/Behavioral: Positive for memory loss. Negative for depression. The patient is not nervous/anxious and does  not have insomnia.        Mild cognitive impairment    Vitals:   10/30/18 0909  BP: 130/80  Pulse: 90  Temp: 98.7 F (37.1 C)  TempSrc: Oral  SpO2: 97%  Weight: 147 lb (66.7 kg)  Height: 5\' 5"  (1.651 m)   Body mass index is 24.46 kg/m. Physical Exam Vitals signs reviewed.  Constitutional:      General: She is not in acute distress.    Appearance: She is normal weight. She is not toxic-appearing.  HENT:     Head: Normocephalic and atraumatic.     Right Ear: External ear normal.     Left Ear: External ear normal.     Ears:     Comments: Bilateral cerumen impaction    Nose: Nose normal. No congestion.     Mouth/Throat:     Mouth: Mucous membranes are moist.     Pharynx: Oropharynx is clear.  Eyes:     Extraocular Movements: Extraocular movements intact.     Conjunctiva/sclera: Conjunctivae normal.     Pupils: Pupils are equal, round, and reactive to light.     Comments: glasses    Cardiovascular:     Rate and Rhythm: Normal rate and regular rhythm.     Pulses: Normal pulses.     Heart sounds: Normal heart sounds.  Pulmonary:     Effort: Pulmonary effort is normal.     Breath sounds: No wheezing, rhonchi or rales.  Abdominal:     General: Bowel sounds are normal. There is no distension.     Palpations: There is no mass.     Tenderness: There is no abdominal tenderness. There is no guarding or rebound.  Musculoskeletal: Normal range of motion.        General: No swelling, tenderness, deformity or signs of injury.     Right lower leg: No edema.     Left lower leg: No edema.  Skin:    General: Skin is warm and dry.     Capillary Refill: Capillary refill takes less than 2 seconds.     Findings: Bruising present.     Comments: Hyperpigmentation of LEs  Neurological:     General: No focal deficit present.     Mental Status: She is alert and oriented to person, place, and time. Mental status is at baseline.     Cranial Nerves: No cranial nerve deficit.     Sensory: Sensory deficit present.     Motor: No weakness.     Coordination: Coordination normal.     Gait: Gait abnormal.     Deep Tendon Reflexes: Reflexes normal.     Comments: Patellar reflexes 1+  Psychiatric:        Mood and Affect: Mood normal.        Behavior: Behavior normal.        Thought Content: Thought content normal.        Judgment: Judgment normal.     Labs reviewed: Basic Metabolic Panel: Recent Labs    04/09/18 0600  NA 139  K 3.9  BUN 22*  CREATININE 0.9   Liver Function Tests: Recent Labs    04/09/18 0600  AST 22  ALT 13  ALKPHOS 70   No results for input(s): LIPASE, AMYLASE in the last 8760 hours. No results for input(s): AMMONIA in the last 8760 hours. CBC: Recent Labs    04/09/18  WBC 5.0  HGB 14.5  HCT 43  PLT 249   Cardiac Enzymes:  No results for input(s): CKTOTAL, CKMB, CKMBINDEX, TROPONINI in the last 8760 hours. BNP: Invalid input(s): POCBNP No  results found for: HGBA1C No results found for: TSH No results found for: VITAMINB12 No results found for: FOLATE No results found for: IRON, TIBC, FERRITIN  Imaging and Procedures Recently: EKG performed today with NSR and possible old anteroseptal infarct, no acute ischemia or infarctHR 88bpm  Assessment/Plan 1. Annual physical exam -performed today, doing great, up to date on health maintenance appropriate for age  3. Paroxysmal atrial fibrillation (HCC) -currently in sinus today, cont rate control meds and xarelto  3. Chronic anticoagulation -cont xarelto therapy, not recent bleeding diathesis -f/u labs before 6 month f/u  4. Essential hypertension -bp well controlled, cont same regimen and monitor  5. Pure hypercholesterolemia -lipids at goal, cont same regimen and activity  6. Venous insufficiency of both lower extremities -ongoing, elevate feet at rest, pt stays quite active  7. Macular degeneration of both eyes, unspecified type Stable vision per patient  8.  Bilateral cerumen impaction:  Flushed with warm water and peroxide today  Labs/tests ordered:  Cbc with diff, cmp, flp before  F/u 6 mos.  Deniya Craigo L. Kinte Trim, D.O. [facility] [address], [place] [address] Cell Phone (Mon-Fri 8am-5pm):  [phone] On Call:  [phone] & follow prompts after 5pm & weekends Office Phone:  [phone] Office Fax:  [phone]

## 2018-10-30 NOTE — Patient Instructions (Signed)
Please bring by the herbal supplement you are taking for your immune system.

## 2018-11-22 ENCOUNTER — Other Ambulatory Visit: Payer: Self-pay | Admitting: Internal Medicine

## 2018-11-26 ENCOUNTER — Other Ambulatory Visit: Payer: Self-pay

## 2018-11-26 NOTE — Patient Outreach (Signed)
[facility]) Care Management  11/26/2018  Rebekah Graham 10-23-1928 [id number]   Medication Adherence call to [name] spoke with patient she is due on Losartan /Hctz 100/12.5 mg patient explain she is still taking 1 tablet daily and pharmacy was waiting for doctor to approve her refill, pharmacy deliver it yesterday. [name] is showing due under [facility].   [facility] Direct Dial [phone]  Fax [phone] [email or web address]

## 2018-12-08 ENCOUNTER — Encounter: Payer: Self-pay | Admitting: Internal Medicine

## 2018-12-09 ENCOUNTER — Other Ambulatory Visit: Payer: Self-pay | Admitting: Cardiology

## 2019-02-04 ENCOUNTER — Other Ambulatory Visit: Payer: Self-pay

## 2019-02-04 ENCOUNTER — Ambulatory Visit (INDEPENDENT_AMBULATORY_CARE_PROVIDER_SITE_OTHER): Payer: Medicare Other | Admitting: Family

## 2019-02-04 ENCOUNTER — Encounter: Payer: Self-pay | Admitting: Family

## 2019-02-04 VITALS — BP 138/70 | HR 78 | Temp 98.5°F | Ht 64.0 in | Wt 149.0 lb

## 2019-02-04 DIAGNOSIS — L03119 Cellulitis of unspecified part of limb: Secondary | ICD-10-CM | POA: Diagnosis not present

## 2019-02-04 MED ORDER — DOXYCYCLINE HYCLATE 100 MG PO TABS: 100.0000 mg | ORAL_TABLET | Freq: Two times a day (BID) | ORAL | 0 refills | Status: DC

## 2019-02-04 MED ORDER — SACCHAROMYCES BOULARDII 250 MG PO CAPS: 250.0000 mg | ORAL_CAPSULE | Freq: Two times a day (BID) | ORAL | 0 refills | Status: AC

## 2019-02-04 NOTE — Patient Instructions (Signed)

## 2019-02-04 NOTE — Progress Notes (Signed)
Provider: Dinah Ngetich FNP-C  Gayland Curry, DO  Patient Care Team: Gayland Curry, DO as PCP - General (Geriatric Medicine) [facility], [place] Donovan, NP as Nurse Practitioner (Geriatric Medicine) Adrian Prows, MD as Consulting Physician (Cardiology) Earlean Polka, MD as Consulting Physician (Ophthalmology)  Extended Emergency Contact Information Primary Emergency Contact: [name] of [place] Phone: [phone] Work Phone: [phone] Relation: Son  Goals of care: Advanced Directive information Advanced Directives 10/10/2017  Does Patient Have a Medical Advance Directive? Yes  Type of Paramedic of [place];Out of facility DNR (pink MOST or yellow form)  Does patient want to make changes to medical advance directive? No - Patient declined  Copy of [place] in Chart? Yes  Pre-existing out of facility DNR order (yellow form or pink MOST form) Yellow form placed in chart (order not valid for inpatient use)     Chief Complaint  Patient presents with  . Acute Visit    insect bite    HPI:  Pt is a 85 y.o. female seen today for an acute visit for evaluation of left elbow swelling,itching and redness x 3 days.she resides at [facility] woke up three days ago with itching and swelling of the left elbow.she applied hydrocortisone with some relief but skin redness seems to be worsening.Nurse at the facility instructed her to be evaluated.she denies any pain,njury to elbow,fever,chills or shortness of breath.she has not been outside on the woods.she thinks it might be an insect bite.No loss of appetite,nausea,vomiting or muscle ache.  Past Medical History:  Diagnosis Date  . Actinic keratosis 09/23/2012  . 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 Arthralgia of temporomandibular joint 12/30/2009  . Atrial fibrillation ([place])   . Atrial tachycardia ([place])    s/p  ablation 2008. [facility] (R free wall Atach)  . Atrophy of vulva 09/23/2012  . Blepharochalasis 03/01/2011  . Cardiac dysrhythmia, unspecified 11/10/2009  . Dermatophytosis of nail 02/28/2008  . Dizziness and giddiness 04/03/2007  . Dysphagia, unspecified(787.20) 01/17/2012  . Edema 11/21/2006  . External hemorrhoids without mention of complication 78/58/8502  . HTN (hypertension)   . Hyperlipidemia   . Insomnia, unspecified 11/18/2004  . Internal hemorrhoids without mention of complication 7/74/1287  . Left bundle branch hemiblock   . Loss of weight 03/05/2012  . Myalgia and myositis, unspecified 01/05/2009  . Orthostatic hypotension 03/22/2007  . Other malaise and fatigue 03/05/2012  . Pain in joint, site unspecified 02/28/2008  . Palpitations 11/21/2006  . Personality change due to conditions classified elsewhere   . Rectocele 09/23/2012  . Restless legs syndrome (RLS)   . Rosacea 09/23/2012  . Sebaceous cyst 03/01/2011  . Thumb pain 11/20/2012  . Unspecified hereditary and idiopathic peripheral neuropathy    Past Surgical History:  Procedure Laterality Date  . BLADDER SUSPENSION  1998  . CATARACT EXTRACTION W/ INTRAOCULAR LENS IMPLANT Right 4/8//2008   Dr. Charise Killian  . catheter ablation  2008   for atrial tachycardia  . CYST EXCISION  1998   mucus cyst removal from finger  . TOTAL ABDOMINAL HYSTERECTOMY  2001   uterine bleeding    Allergies  Allergen Reactions  . Erythromycin Nausea And Vomiting  . Flecainide Acetate     Postural hypotension  . Penicillins   . Simvastatin     Pains in arms and legs    Outpatient Encounter Medications as of 02/04/2019  Medication Sig  . cholecalciferol (VITAMIN D) 1000 UNITS tablet  Take 1,000 Units by mouth daily.  Marland Kitchen diltiazem (TIAZAC) 240 MG 24 hr capsule Take 240 mg by mouth daily.  . Homeopathic Products (ZICAM COLD REMEDY PO) Take 1 tablet by mouth daily.  Marland Kitchen losartan-hydrochlorothiazide (HYZAAR) 100-12.5 MG tablet TAKE 1 TABLET IN THE MORNING.  .  metoprolol succinate (TOPROL-XL) 25 MG 24 hr tablet Take 25 mg by mouth daily.  . metoprolol succinate (TOPROL-XL) 50 MG 24 hr tablet TAKE 1/2 TABLET EVERY DAY.  . mirtazapine (REMERON) 7.5 MG tablet Take 7.5 mg by mouth at bedtime.  . Misc Natural Products (TURMERIC CURCUMIN) CAPS Take 1 capsule by mouth daily.    . Multiple Vitamins-Minerals (PRESERVISION AREDS PO) Take 1 tablet by mouth daily.   . simvastatin (ZOCOR) 40 MG tablet TAKE 1 TABLET DAILY TO LOWER CHOLESTEROL.  Marland Kitchen triamcinolone cream (KENALOG) 0.1 %   . XARELTO 20 MG TABS tablet take 1 tablet by mouth once daily WITH DINNER   No facility-administered encounter medications on file as of 02/04/2019.     Review of Systems  Constitutional: Negative for appetite change, chills, fatigue, fever and unexpected weight change.  HENT: Negative for congestion, rhinorrhea, sinus pressure, sinus pain, sneezing, sore throat and trouble swallowing.   Eyes: Negative for discharge, redness and itching.  Respiratory: Negative for cough, chest tightness, shortness of breath and wheezing.   Cardiovascular: Negative for chest pain, palpitations and leg swelling.  Gastrointestinal: Negative for abdominal distention, abdominal pain, diarrhea, nausea and vomiting.  Genitourinary: Negative for decreased urine volume, difficulty urinating, dysuria, flank pain, frequency and urgency.  Musculoskeletal: Negative for arthralgias and myalgias.  Skin: Negative for rash and wound.       Left elbow redness,swellon and itchy skin   Neurological: Negative for dizziness, weakness, light-headedness and headaches.  Psychiatric/Behavioral: Negative for agitation, confusion and sleep disturbance. The patient is not nervous/anxious.     Immunization History  Administered Date(s) Administered  . Influenza Whole 06/12/2012  . Influenza,inj,Quad PF,6+ Mos 05/13/2013, 06/07/2018  . Influenza-Unspecified 05/27/2014, 04/24/2015, 04/13/2016, 04/24/2017  . Pneumococcal  Conjugate-13 05/27/2014  . Pneumococcal Polysaccharide-23 08/14/1997  . Td 04/15/2011  . Tdap 07/15/2015  . Zoster 08/15/2007  . Zoster Recombinat (Shingrix) 02/12/2017   Pertinent  Health Maintenance Due  Topic Date Due  . INFLUENZA VACCINE  03/15/2019  . DEXA SCAN  Completed  . PNA vac Low Risk Adult  Completed   Fall Risk  10/30/2018 04/25/2018 04/17/2018 10/02/2017 08/03/2017  Falls in the past year? 0 No No No No  Number falls in past yr: 0 - - - -  Injury with Fall? 0 - - - -  Comment - - - - -  Risk for fall due to : - - - - -  Follow up - - - - -    Vitals:   02/04/19 1504  BP: 138/70  Pulse: 78  Temp: 98.5 F (36.9 C)  TempSrc: Oral  SpO2: 97%  Weight: 149 lb (67.6 kg)  Height: 5\' 4"  (1.626 m)   Body mass index is 25.58 kg/m. Physical Exam Constitutional:      General: She is not in acute distress.    Appearance: She is normal weight. She is not ill-appearing.  HENT:     Head: Normocephalic.     Right Ear: Tympanic membrane, ear canal and external ear normal. There is no impacted cerumen.     Left Ear: Tympanic membrane, ear canal and external ear normal. There is no impacted cerumen.     Nose:  Nose normal. No congestion or rhinorrhea.     Mouth/Throat:     Mouth: Mucous membranes are moist.     Pharynx: Oropharynx is clear. No oropharyngeal exudate or posterior oropharyngeal erythema.  Eyes:     General: No scleral icterus.       Right eye: No discharge.        Left eye: No discharge.     Conjunctiva/sclera: Conjunctivae normal.     Pupils: Pupils are equal, round, and reactive to light.  Neck:     Musculoskeletal: Normal range of motion. No neck rigidity or muscular tenderness.  Cardiovascular:     Rate and Rhythm: Normal rate and regular rhythm.     Pulses: Normal pulses.     Heart sounds: Normal heart sounds. No murmur. No friction rub. No gallop.   Pulmonary:     Effort: Pulmonary effort is normal. No respiratory distress.     Breath sounds:  Normal breath sounds. No wheezing, rhonchi or rales.  Chest:     Chest wall: No tenderness.  Abdominal:     General: Bowel sounds are normal. There is no distension.     Palpations: Abdomen is soft. There is no mass.     Tenderness: There is no abdominal tenderness. There is no right CVA tenderness, left CVA tenderness, guarding or rebound.  Musculoskeletal: Normal range of motion.        General: No swelling or tenderness.     Right lower leg: No edema.     Left lower leg: No edema.  Lymphadenopathy:     Cervical: No cervical adenopathy.  Skin:    General: Skin is warm and dry.     Coloration: Skin is not pale.     Findings: No bruising or rash.     Comments: Left elbow erythema,warm and non-tender golf size swelling/abscess   Neurological:     Mental Status: She is alert and oriented to person, place, and time.     Cranial Nerves: No cranial nerve deficit.     Sensory: No sensory deficit.     Motor: No weakness.     Coordination: Coordination normal.     Gait: Gait normal.  Psychiatric:        Mood and Affect: Mood normal.        Behavior: Behavior normal.        Thought Content: Thought content normal.        Judgment: Judgment normal.    Labs reviewed: Recent Labs    04/09/18 0600  NA 139  K 3.9  BUN 22*  CREATININE 0.9   Recent Labs    04/09/18 0600  AST 22  ALT 13  ALKPHOS 70   Recent Labs    04/09/18  WBC 5.0  HGB 14.5  HCT 43  PLT 249   No results found for: TSH No results found for: HGBA1C Lab Results  Component Value Date   CHOL 152 04/09/2018   HDL 51 04/09/2018   LDLCALC 80 04/09/2018   TRIG 105 04/09/2018    Significant Diagnostic Results in last 30 days:  No results found.  Assessment/Plan   Cellulitis of elbow Afebrile.left elbow area red,warm non tender to touch abscess.unclear etiology ? insect bite. - continue to apply hydrocortisone cream twice daily x 7 days for itching. - apply ice 5-10 minutes daily to relief itching and  swelling - start on Doxycycline 100 mg tablet one by mouth twice daily x 7 days  - Florastor or OTC  probiotics one by mouth twice daily x 10 days for antibiotic associated diarrhea prevention.  - Notify provider's office if symptoms worsen or not resolved. Insect bite education information provided with AVS.   Family/ staff Communication: Reviewed plan of care with patient   Labs/tests ordered: None   Dinah C Ngetich, NP

## 2019-03-03 ENCOUNTER — Encounter: Payer: Self-pay | Admitting: Cardiology

## 2019-03-03 ENCOUNTER — Other Ambulatory Visit: Payer: Self-pay

## 2019-03-03 ENCOUNTER — Ambulatory Visit (INDEPENDENT_AMBULATORY_CARE_PROVIDER_SITE_OTHER): Payer: Medicare Other | Admitting: Cardiology

## 2019-03-03 VITALS — Ht 64.5 in | Wt 146.0 lb

## 2019-03-03 DIAGNOSIS — I10 Essential (primary) hypertension: Secondary | ICD-10-CM | POA: Diagnosis not present

## 2019-03-03 DIAGNOSIS — I480 Paroxysmal atrial fibrillation: Secondary | ICD-10-CM | POA: Diagnosis not present

## 2019-03-03 NOTE — Progress Notes (Signed)
Virtual Visit via Video Note: This visit type was conducted due to national recommendations for restrictions regarding the COVID-19 Pandemic (e.g. social distancing).  This format is felt to be most appropriate for this patient at this time.  All issues noted in this document were discussed and addressed.  No physical exam was performed (except for noted visual exam findings with Telehealth visits).  The patient has consented to conduct a Telehealth visit and understands insurance will be billed.   I connected with@, on 03/03/19 at  by a video enabled telemedicine application and verified that I am speaking with the correct person using two identifiers.   I discussed the limitations of evaluation and management by telemedicine and the availability of in person appointments. The patient expressed understanding and agreed to proceed.   I have discussed with patient regarding the safety during COVID Pandemic and steps and precautions to be taken including social distancing, frequent hand wash and use of detergent soap, gels with the patient. I asked the patient to avoid touching mouth, nose, eyes, ears with the hands. I encouraged regular walking around the neighborhood and exercise and regular diet, as long as social distancing can be maintained.  Primary Physician/Referring:  Gayland Curry, DO  Patient ID: Rebekah Graham, female    DOB: 05-17-1929, 85 y.o.   MRN: [id number]  Chief Complaint  Patient presents with  . Atrial Fibrillation  . Follow-up    7mo  HPI:    HPI: [place] is a 85y.o. female   Caucasian female with history of paroxysmal atrial fibrillation, hypertension and hyperlipidemia.  She is here on a six-month office visit, essentially asymptomatic. She has done well and has remained stable with good blood pressure control and good heart rate control. No bleeding diathesis on Xarelto. No symptoms of PND, or symptoms suggestive of TIA or claudication.   She remains  asymptomatic and no change from prior office visit.  This was a virtual visit.  Denies any melena, dizziness or syncope.  She died 85years of age in [id number]  Past Medical History:  Diagnosis Date  . Actinic keratosis 09/23/2012  . 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 Arthralgia of temporomandibular joint 12/30/2009  . [place]   . [place] ([place]    s/p ablation 2008. [facility] (R free wall Atach)  . Atrophy of vulva 09/23/2012  . Blepharochalasis 03/01/2011  . Cardiac dysrhythmia, unspecified 11/10/2009  . Dermatophytosis of nail 02/28/2008  . Dizziness and giddiness 04/03/2007  . Dysphagia, unspecified(787.20) 01/17/2012  . Edema 11/21/2006  . External hemorrhoids without mention of complication 153/97/6734 . HTN (hypertension)   . Hyperlipidemia   . Insomnia, unspecified 11/18/2004  . Internal hemorrhoids without mention of complication 71/93/7902 . Left bundle branch hemiblock   . Loss of weight 03/05/2012  . Myalgia and myositis, unspecified 01/05/2009  . Orthostatic hypotension 03/22/2007  . Other malaise and fatigue 03/05/2012  . Pain in joint, site unspecified 02/28/2008  . Palpitations 11/21/2006  . Personality change due to conditions classified elsewhere   . Rectocele 09/23/2012  . Restless legs syndrome (RLS)   . Rosacea 09/23/2012  . Sebaceous cyst 03/01/2011  . Thumb pain 11/20/2012  . Unspecified hereditary and idiopathic peripheral neuropathy    Past Surgical History:  Procedure Laterality Date  . BLADDER SUSPENSION  1998  . CATARACT EXTRACTION W/ INTRAOCULAR LENS IMPLANT Right 4/8//2008   Dr. ECharise Killian . catheter ablation  2008   for atrial  tachycardia  . CYST EXCISION  1998   mucus cyst removal from finger  . TOTAL ABDOMINAL HYSTERECTOMY  2001   uterine bleeding   Social History   Socioeconomic History  . Marital status: Widowed    Spouse name: Not on file  . Number of children: 3  . Years of education: Not on file  . Highest education level: Not  on file  Occupational History  . Occupation: Retired Product manager: [employer]  . Financial resource strain: Not hard at all  . Food insecurity    Worry: Never true    Inability: Never true  . Transportation needs    Medical: No    Non-medical: No  Tobacco Use  . Smoking status: Never Smoker  . Smokeless tobacco: Never Used  . Tobacco comment: tobacco  - none  Substance and Sexual Activity  . Alcohol use: Yes    Comment: 1-2 glasses of wine/gin daily   . Drug use: No  . Sexual activity: Never  Lifestyle  . Physical activity    Days per week: 7 days    Minutes per session: 60 min  . Stress: Only a little  Relationships  . Social connections    Talks on phone: More than three times a week    Gets together: More than three times a week    Attends religious service: More than 4 times per year    Active member of club or organization: Yes    Attends meetings of clubs or organizations: 1 to 4 times per year    Relationship status: Widowed  . Intimate partner violence    Fear of current or ex partner: No    Emotionally abused: No    Physically abused: No    Forced sexual activity: No  Other Topics Concern  . 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  Cardiovascular: Positive for leg swelling (chronic and stable). Negative for dyspnea on exertion and syncope.  Endocrine: Negative for cold intolerance.  Hematologic/Lymphatic: Does not bruise/bleed easily.  Musculoskeletal: Positive for joint pain. Negative for joint swelling.  Gastrointestinal: Negative for abdominal pain, anorexia, change in bowel habit, hematochezia and melena.  Neurological:  Negative for headaches and light-headedness.  Psychiatric/Behavioral: Negative for depression and substance abuse.  All other systems reviewed and are negative.  Objective  Height 5' 4.5" (1.638 m), weight 146 lb (66.2 kg). Body mass index is 24.67 kg/m.  Physical exam not performed or limited due to virtual visit.  Patient appeared to be in no distress, Neck was supple, respiration was not labored.  Please see exam details from prior visit is as below.  Physical Exam  Constitutional: She appears well-developed and well-nourished. No distress.  HENT:  Head: Atraumatic.  Eyes: Conjunctivae are normal.  Neck: Neck supple. No JVD present. No thyromegaly present.  Cardiovascular: Normal rate, regular rhythm, normal heart sounds and intact distal pulses. Exam reveals no gallop.  No murmur heard. Trace edema present  Pulmonary/Chest: Effort normal and breath sounds normal.  Abdominal: Soft. Bowel sounds are normal.  Musculoskeletal: Normal range of motion.  Neurological: [name]  Skin: Skin is warm and dry.  Psychiatric: She has a normal mood and affect.   Radiology: No results found.  Laboratory examination:   CMP Latest Ref Rng & Units 04/09/2018 09/26/2016 09/28/2015  BUN 4 - 21 22(A) 16 19  Creatinine 0.5 - 1.1 0.9 0.7 0.8  Sodium 137 - 147 139 140 141  Potassium 3.4 - 5.3 3.9 4.1 4.4  Alkaline Phos 25 - 125 70 84 65  AST 13 - 35 _0 ALT 7 - 35 _1 CBC Latest Ref Rng & Units 04/09/2018 08/03/2017 09/26/2016  WBC - 5.0 7.1 5.2  Hemoglobin 12.0 - 16.0 14.5 12.8 14.1  Hematocrit 36 - 46 43 38.4 43  Platelets 150 - 399 249 412(H) 211   Lipid Panel     Component Value Date/Time   CHOL 152 04/09/2018   TRIG 105 04/09/2018   HDL 51 04/09/2018   LDLCALC 80 04/09/2018   HEMOGLOBIN A1C No results found for: HGBA1C, MPG TSH No results for input(s): TSH in the last 8760 hours. Medications   Current Outpatient Medications  Medication Instructions  .  cholecalciferol (VITAMIN D) 1,000 Units, Oral, Daily  . diltiazem (TIAZAC) 240 mg, Oral, Daily  . Homeopathic Products (ZICAM COLD REMEDY PO) 1 tablet, Oral, 2 times daily  . losartan-hydrochlorothiazide (HYZAAR) 100-12.5 MG tablet TAKE 1 TABLET IN THE MORNING.  . metoprolol succinate (TOPROL-XL) 50 MG 24 hr tablet TAKE 1/2 TABLET EVERY DAY.  Marland Kitchen Misc Natural Products (TURMERIC CURCUMIN) CAPS 1 capsule, Daily  . Multiple Vitamins-Minerals (PRESERVISION AREDS PO) 1 tablet, Oral, Daily  . simvastatin (ZOCOR) 40 MG tablet TAKE 1 TABLET DAILY TO LOWER CHOLESTEROL.  Marland Kitchen triamcinolone cream (KENALOG) 0.1 % As needed  . XARELTO 20 MG TABS tablet take 1 tablet by mouth once daily WITH DINNER   Cardiac Studies:   Echo- 07/13/2015 1. Left ventricle cavity is normal in size. Mild concentric hypertrophy of the left ventricle. Normal global wall motion. Calculated EF 66%. 2. Left atrial cavity is mildly dilated. 3. Trace aortic regurgitation. 4. Mild mitral regurgitation. 5. Mild tricuspid regurgitation. No evidence of pulmonary hypertension.  Assessment     ICD-10-CM   1. Paroxysmal atrial fibrillation (HCC)  I48.0    CHA2DS2-VASc Score is 4.  Yearly risk of stroke: 4%  2. Essential hypertension  I10     EKG 09/02/18/20: Probably ectopic atrial rhythm at the rate of 99 bpm with borderline first-degree AV block.  Normal axis.  Nonspecific T abnormality.   Recommendations:   Patient is presently doing well and denies any palpitations, dizziness or syncope, tolerating anticoagulation well without any bleeding diathesis.  She is presently on 20 mg of Xarelto, I reviewed her labs from the PCP, EGFR is greater than 60 mL.  Leg edema has remained stable and very minimal.  She does have indeed ecchymosis but she is not bothered by this.  Blood pressure previously has been well controlled, she has not recently had rechecked but she is also scheduled for a complete physical next month with Hollace Kinnier, DO.   From cardiac standpoint she remained stable, I will see her back in 6 months.  Adrian Prows, MD, [facility] 03/03/2019, 2:09 PM [facility]. [place] Pager: [phone] Office: [phone] If no answer Cell [phone]

## 2019-04-22 LAB — BASIC METABOLIC PANEL
BUN: 21 (ref 4–21)
Creatinine: 0.8 (ref 0.5–1.1)
Glucose: 97
Potassium: 3.8 (ref 3.4–5.3)
Sodium: 141 (ref 137–147)

## 2019-04-22 LAB — HEPATIC FUNCTION PANEL
ALT: 16 (ref 7–35)
AST: 25 (ref 13–35)
Alkaline Phosphatase: 64 (ref 25–125)
Bilirubin, Total: 0.4

## 2019-04-22 LAB — CBC AND DIFFERENTIAL
HCT: 41 (ref 36–46)
Hemoglobin: 14.1 (ref 12.0–16.0)
Platelets: 216 (ref 150–399)
WBC: 6

## 2019-04-22 LAB — LIPID PANEL
Cholesterol: 160 (ref 0–200)
HDL: 59 (ref 35–70)
LDL Cholesterol: 87
Triglycerides: 73 (ref 40–160)

## 2019-04-23 ENCOUNTER — Other Ambulatory Visit: Payer: Self-pay | Admitting: Internal Medicine

## 2019-04-25 ENCOUNTER — Encounter: Payer: Self-pay | Admitting: Internal Medicine

## 2019-04-30 ENCOUNTER — Non-Acute Institutional Stay: Payer: Medicare Other | Admitting: Internal Medicine

## 2019-04-30 ENCOUNTER — Encounter: Payer: Self-pay | Admitting: Internal Medicine

## 2019-04-30 ENCOUNTER — Other Ambulatory Visit: Payer: Self-pay

## 2019-04-30 VITALS — BP 128/80 | HR 89 | Temp 98.8°F | Ht 65.0 in | Wt 147.0 lb

## 2019-04-30 DIAGNOSIS — E7800 Pure hypercholesterolemia, unspecified: Secondary | ICD-10-CM | POA: Diagnosis not present

## 2019-04-30 DIAGNOSIS — I480 Paroxysmal atrial fibrillation: Secondary | ICD-10-CM

## 2019-04-30 DIAGNOSIS — I10 Essential (primary) hypertension: Secondary | ICD-10-CM

## 2019-04-30 DIAGNOSIS — I872 Venous insufficiency (chronic) (peripheral): Secondary | ICD-10-CM

## 2019-04-30 DIAGNOSIS — Z7901 Long term (current) use of anticoagulants: Secondary | ICD-10-CM

## 2019-04-30 DIAGNOSIS — H3530 Unspecified macular degeneration: Secondary | ICD-10-CM

## 2019-04-30 NOTE — Progress Notes (Signed)
Location:  Occupational psychologist of Service:  Clinic (12)  Provider: Careli Luzader L. Mariea Clonts, D.O., C.M.D.  Code Status: DNR Goals of Care:  Advanced Directives 04/30/2019  Does Patient Have a Medical Advance Directive? Yes  Type of Paramedic of [place];Out of facility DNR (pink MOST or yellow form)  Does patient want to make changes to medical advance directive? No - Patient declined  Copy of May in Chart? Yes - validated most recent copy scanned in chart (See row information)  Pre-existing out of facility DNR order (yellow form or pink MOST form) Yellow form placed in chart (order not valid for inpatient use)     Chief Complaint  Patient presents with  . Medical Management of Chronic Issues    6 month follow-up and discuss labs (copy available for patient)   . Immunizations    Flu vaccine due, will get at [facility]     HPI: Patient is a 85 y.o. female seen today for medical management of chronic diseases.    BP excellent today.  LDL 87 with HDL 59.    She had a zoom visit with Dr. Einar Gip.    She really does not have concerns today.  Feels fortunate to be her age.  She continues with her exercise--water aerobics 6-7 times per week.  Some with class and some with a neighbor.  Does 2-3 of Robin's exercises on tv.  Trying to eat healthily and not overeat.    No recent difficulty with swelling.    Had shingrix first at [facility] and ands at [employer].  No dizziness.  Feels well overall.    Past Medical History:  Diagnosis Date  . Actinic keratosis 09/23/2012  . 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 Arthralgia of temporomandibular joint 12/30/2009  . Atrial fibrillation ([place])   . Atrial tachycardia ([place])    s/p ablation 2008. [facility] (R free wall Atach)  . Atrophy of vulva 09/23/2012  . Blepharochalasis 03/01/2011  . Cardiac dysrhythmia, unspecified 11/10/2009  . Dermatophytosis of nail 02/28/2008  . Dizziness and  giddiness 04/03/2007  . Dysphagia, unspecified(787.20) 01/17/2012  . Edema 11/21/2006  . External hemorrhoids without mention of complication [id number]  . HTN (hypertension)   . Hyperlipidemia   . Insomnia, unspecified 11/18/2004  . Internal hemorrhoids without mention of complication 99991111  . Left bundle branch hemiblock   . Loss of weight 03/05/2012  . Myalgia and myositis, unspecified 01/05/2009  . Orthostatic hypotension 03/22/2007  . Other malaise and fatigue 03/05/2012  . Pain in joint, site unspecified 02/28/2008  . Palpitations 11/21/2006  . Personality change due to conditions classified elsewhere   . Rectocele 09/23/2012  . Restless legs syndrome (RLS)   . Rosacea 09/23/2012  . Sebaceous cyst 03/01/2011  . Thumb pain 11/20/2012  . Unspecified hereditary and idiopathic peripheral neuropathy     Past Surgical History:  Procedure Laterality Date  . BLADDER SUSPENSION  1998  . CATARACT EXTRACTION W/ INTRAOCULAR LENS IMPLANT Right 4/8//2008   Dr. Charise Killian  . catheter ablation  2008   for atrial tachycardia  . CYST EXCISION  1998   mucus cyst removal from finger  . TOTAL ABDOMINAL HYSTERECTOMY  2001   uterine bleeding    Allergies  Allergen Reactions  . Erythromycin Nausea And Vomiting  . Flecainide Acetate     Postural hypotension  . Penicillins   . Simvastatin     Pains in arms and legs    Outpatient  Encounter Medications as of 04/30/2019  Medication Sig  . cholecalciferol (VITAMIN D) 1000 UNITS tablet Take 1,000 Units by mouth daily.  Marland Kitchen diltiazem (TIAZAC) 240 MG 24 hr capsule Take 240 mg by mouth daily.  . Homeopathic Products (ZICAM COLD REMEDY PO) Take 2-3 tablets by mouth daily.  Marland Kitchen losartan-hydrochlorothiazide (HYZAAR) 100-12.5 MG tablet TAKE 1 TABLET IN THE MORNING.  . metoprolol succinate (TOPROL-XL) 50 MG 24 hr tablet TAKE 1/2 TABLET EVERY DAY.  Marland Kitchen Misc Natural Products (TURMERIC CURCUMIN) CAPS Take 1 capsule by mouth daily.    . Multiple Vitamins-Minerals (PRESERVISION  AREDS PO) Take 1 tablet by mouth daily.   . simvastatin (ZOCOR) 40 MG tablet TAKE 1 TABLET DAILY TO LOWER CHOLESTEROL.  Marland Kitchen triamcinolone cream (KENALOG) 0.1 % as needed.   Alveda Reasons 20 MG TABS tablet take 1 tablet by mouth once daily WITH DINNER  . [DISCONTINUED] Homeopathic Products (ZICAM COLD REMEDY PO) Take 1 tablet by mouth 2 (two) times a day.    No facility-administered encounter medications on file as of 04/30/2019.     Review of Systems:  Review of Systems  Constitutional: Negative for chills, fever and malaise/fatigue.  HENT: Positive for hearing loss. Negative for congestion.        Hearing aids  Eyes:       Glasses  Respiratory: Negative for cough and shortness of breath.   Cardiovascular: Positive for palpitations. Negative for chest pain and leg swelling.  Gastrointestinal: Negative for abdominal pain, blood in stool, constipation, diarrhea and melena.  Genitourinary: Negative for dysuria.  Musculoskeletal: Positive for joint pain. Negative for falls.  Skin: Negative for itching and rash.  Neurological: Negative for dizziness and loss of consciousness.  Endo/Heme/Allergies: Bruises/bleeds easily.  Psychiatric/Behavioral: Negative for depression and memory loss. The patient is not nervous/anxious and does not have insomnia.     Health Maintenance  Topic Date Due  . INFLUENZA VACCINE  03/15/2019  . TETANUS/TDAP  07/14/2025  . DEXA SCAN  Completed  . PNA vac Low Risk Adult  Completed    Physical Exam: Vitals:   04/30/19 0900  BP: 128/80  Pulse: 89  Temp: 98.8 F (37.1 C)  TempSrc: Oral  SpO2: 98%  Weight: 147 lb (66.7 kg)  Height: 5\' 5"  (1.651 m)   Body mass index is 24.46 kg/m. Physical Exam Vitals signs reviewed.  Constitutional:      General: She is not in acute distress.    Appearance: Normal appearance. She is not toxic-appearing.  HENT:     Head: Normocephalic and atraumatic.     Ears:     Comments: Hearing aids Eyes:     Conjunctiva/sclera:  Conjunctivae normal.     Pupils: Pupils are equal, round, and reactive to light.     Comments: glasses  Cardiovascular:     Rate and Rhythm: Rhythm irregular.     Heart sounds: No murmur. No friction rub. No gallop.   Pulmonary:     Effort: Pulmonary effort is normal.     Breath sounds: Normal breath sounds.  Abdominal:     General: Bowel sounds are normal. There is no distension.     Palpations: Abdomen is soft.     Tenderness: There is no abdominal tenderness.  Musculoskeletal: Normal range of motion.  Skin:    General: Skin is warm and dry.     Capillary Refill: Capillary refill takes less than 2 seconds.     Comments: Hyperpigmentation of shins, right tender, left not (has skin graft  on left from prior fall with injury several years ago)  Neurological:     General: No focal deficit present.     Mental Status: She is alert and oriented to person, place, and time. Mental status is at baseline.     Cranial Nerves: No cranial nerve deficit.     Motor: No weakness.     Gait: Gait normal.  Psychiatric:        Mood and Affect: Mood normal.        Behavior: Behavior normal.        Thought Content: Thought content normal.        Judgment: Judgment normal.     Labs reviewed: Basic Metabolic Panel: Recent Labs    04/22/19 0500  NA 141  K 3.8  BUN 21  CREATININE 0.8   Liver Function Tests: Recent Labs    04/22/19 0500  AST 25  ALT 16  ALKPHOS 64   No results for input(s): LIPASE, AMYLASE in the last 8760 hours. No results for input(s): AMMONIA in the last 8760 hours. CBC: Recent Labs    04/22/19 0500  WBC 6.0  HGB 14.1  HCT 41  PLT 216   Lipid Panel: Recent Labs    04/22/19 0500  CHOL 160  HDL 59  LDLCALC 87  TRIG 73   No results found for: HGBA1C  Procedures since last visit: No results found.  Assessment/Plan 1. Paroxysmal atrial fibrillation (HCC) -continue diltiazem and toprol xl for rate control -continues on xarelto anticoagulation -h/h and  renal function stable -follows with Dr. Einar Gip  2. Chronic anticoagulation -continue xarelto therapy  3. Essential hypertension -bp well controlled w/o dizziness on diltiazem and toprol xl which was reduced some due to her bp being low before -cont losartan/hctz and adequate hydration  4. Pure hypercholesterolemia -lipids satisfactory for 85 yo female w/o known cad -cont zocor  5. Venous insufficiency of both lower extremities -stays active  6. Macular degeneration of both eyes, unspecified type -continue regular eye exams with Dr. Ellie Lunch  Labs/tests ordered:  No new Next appt:  6 mos for CPE, no labs  Rhian Asebedo L. Zani Kyllonen, D.O. [facility] [address], [place] [address] Cell Phone (Mon-Fri 8am-5pm):  [phone] On Call:  [phone] & follow prompts after 5pm & weekends Office Phone:  [phone] Office Fax:  [phone]

## 2019-05-19 ENCOUNTER — Other Ambulatory Visit: Payer: Self-pay | Admitting: Internal Medicine

## 2019-05-19 ENCOUNTER — Other Ambulatory Visit: Payer: Self-pay | Admitting: Cardiology

## 2019-06-10 ENCOUNTER — Ambulatory Visit (INDEPENDENT_AMBULATORY_CARE_PROVIDER_SITE_OTHER): Payer: Medicare Other | Admitting: Nurse Practitioner

## 2019-06-10 ENCOUNTER — Other Ambulatory Visit: Payer: Self-pay

## 2019-06-10 DIAGNOSIS — Z0000 Encounter for general adult medical examination without abnormal findings: Secondary | ICD-10-CM | POA: Diagnosis not present

## 2019-06-10 NOTE — Patient Instructions (Signed)
Rebekah Graham , Thank you for taking time to come for your Medicare Wellness Visit. I appreciate your ongoing commitment to your health goals. Please review the following plan we discussed and let me know if I can assist you in the future.   Screening recommendations/referrals: Colonoscopy aged out Mammogram aged out Bone Density up to date Recommended yearly ophthalmology/optometry visit for glaucoma screening and checkup Recommended yearly dental visit for hygiene and checkup  Vaccinations: Influenza vaccine up to date Pneumococcal vaccine up to date Tdap vaccine up to date Shingles vaccine up to date    Advanced directives: on file  Conditions/risks identified: none.  Next appointment: 1 year   Preventive Care 83 Years and Older, Female Preventive care refers to lifestyle choices and visits with your health care provider that can promote health and wellness. What does preventive care include?  A yearly physical exam. This is also called an annual well check.  Dental exams once or twice a year.  Routine eye exams. Ask your health care provider how often you should have your eyes checked.  Personal lifestyle choices, including:  Daily care of your teeth and gums.  Regular physical activity.  Eating a healthy diet.  Avoiding tobacco and drug use.  Limiting alcohol use.  Practicing safe sex.  Taking low-dose aspirin every day.  Taking vitamin and mineral supplements as recommended by your health care provider. What happens during an annual well check? The services and screenings done by your health care provider during your annual well check will depend on your age, overall health, lifestyle risk factors, and family history of disease. Counseling  Your health care provider may ask you questions about your:  Alcohol use.  Tobacco use.  Drug use.  Emotional well-being.  Home and relationship well-being.  Sexual activity.  Eating habits.  History of falls.   Memory and ability to understand (cognition).  Work and work Statistician.  Reproductive health. Screening  You may have the following tests or measurements:  Height, weight, and BMI.  Blood pressure.  Lipid and cholesterol levels. These may be checked every 5 years, or more frequently if you are over 83 years old.  Skin check.  Lung cancer screening. You may have this screening every year starting at age 83 if you have a 30-pack-year history of smoking and currently smoke or have quit within the past 15 years.  Fecal occult blood test (FOBT) of the stool. You may have this test every year starting at age 83.  Flexible sigmoidoscopy or colonoscopy. You may have a sigmoidoscopy every 5 years or a colonoscopy every 10 years starting at age 83.  Hepatitis C blood test.  Hepatitis B blood test.  Sexually transmitted disease (STD) testing.  Diabetes screening. This is done by checking your blood sugar (glucose) after you have not eaten for a while (fasting). You may have this done every 1-3 years.  Bone density scan. This is done to screen for osteoporosis. You may have this done starting at age 83.  Mammogram. This may be done every 1-2 years. Talk to your health care provider about how often you should have regular mammograms. Talk with your health care provider about your test results, treatment options, and if necessary, the need for more tests. Vaccines  Your health care provider may recommend certain vaccines, such as:  Influenza vaccine. This is recommended every year.  Tetanus, diphtheria, and acellular pertussis (Tdap, Td) vaccine. You may need a Td booster every 10 years.  Zoster vaccine.  You may need this after age 83.  Pneumococcal 13-valent conjugate (PCV13) vaccine. One dose is recommended after age 83.  Pneumococcal polysaccharide (PPSV23) vaccine. One dose is recommended after age 83. Talk to your health care provider about which screenings and vaccines you  need and how often you need them. 83 information is not intended to replace advice given to you by your health care provider. Make sure you discuss any questions you have with your health care provider. Document Released: 08/27/2015 Document Revised: 04/19/2016 Document Reviewed: 06/01/2015 Elsevier Interactive Patient Education  2017 [employer] Prevention in the Home Falls can cause injuries. They can happen to people of all ages. There are many things you can do to make your home safe and to help prevent falls. What can I do on the outside of my home?  Regularly fix the edges of walkways and driveways and fix any cracks.  Remove anything that might make you trip as you walk through a door, such as a raised step or threshold.  Trim any bushes or trees on the path to your home.  Use bright outdoor lighting.  Clear any walking paths of anything that might make someone trip, such as rocks or tools.  Regularly check to see if handrails are loose or broken. Make sure that both sides of any steps have handrails.  Any raised decks and porches should have guardrails on the edges.  Have any leaves, snow, or ice cleared regularly.  Use sand or salt on walking paths during winter.  Clean up any spills in your garage right away. This includes oil or grease spills. What can I do in the bathroom?  Use night lights.  Install grab bars by the toilet and in the tub and shower. Do not use towel bars as grab bars.  Use non-skid mats or decals in the tub or shower.  If you need to sit down in the shower, use a plastic, non-slip stool.  Keep the floor dry. Clean up any water that spills on the floor as soon as it happens.  Remove soap buildup in the tub or shower regularly.  Attach bath mats securely with double-sided non-slip rug tape.  Do not have throw rugs and other things on the floor that can make you trip. What can I do in the bedroom?  Use night lights.  Make sure  that you have a light by your bed that is easy to reach.  Do not use any sheets or blankets that are too big for your bed. They should not hang down onto the floor.  Have a firm chair that has side arms. You can use this for support while you get dressed.  Do not have throw rugs and other things on the floor that can make you trip. What can I do in the kitchen?  Clean up any spills right away.  Avoid walking on wet floors.  Keep items that you use a lot in easy-to-reach places.  If you need to reach something above you, use a strong step stool that has a grab bar.  Keep electrical cords out of the way.  Do not use floor polish or wax that makes floors slippery. If you must use wax, use non-skid floor wax.  Do not have throw rugs and other things on the floor that can make you trip. What can I do with my stairs?  Do not leave any items on the stairs.  Make sure that there are handrails on  both sides of the stairs and use them. Fix handrails that are broken or loose. Make sure that handrails are as long as the stairways.  Check any carpeting to make sure that it is firmly attached to the stairs. Fix any carpet that is loose or worn.  Avoid having throw rugs at the top or bottom of the stairs. If you do have throw rugs, attach them to the floor with carpet tape.  Make sure that you have a light switch at the top of the stairs and the bottom of the stairs. If you do not have them, ask someone to add them for you. What else can I do to help prevent falls?  Wear shoes that:  Do not have high heels.  Have rubber bottoms.  Are comfortable and fit you well.  Are closed at the toe. Do not wear sandals.  If you use a stepladder:  Make sure that it is fully opened. Do not climb a closed stepladder.  Make sure that both sides of the stepladder are locked into place.  Ask someone to hold it for you, if possible.  Clearly mark and make sure that you can see:  Any grab bars or  handrails.  First and last steps.  Where the edge of each step is.  Use tools that help you move around (mobility aids) if they are needed. These include:  Canes.  Walkers.  Scooters.  Crutches.  Turn on the lights when you go into a dark area. Replace any light bulbs as soon as they burn out.  Set up your furniture so you have a clear path. Avoid moving your furniture around.  If any of your floors are uneven, fix them.  If there are any pets around you, be aware of where they are.  Review your medicines with your doctor. Some medicines can make you feel dizzy. This can increase your chance of falling. Ask your doctor what other things that you can do to help prevent falls. 83 information is not intended to replace advice given to you by your health care provider. Make sure you discuss any questions you have with your health care provider. Document Released: 05/27/2009 Document Revised: 01/06/2016 Document Reviewed: 09/04/2014 Elsevier Interactive Patient Education  2017 [employer].

## 2019-06-10 NOTE — Progress Notes (Signed)
Subjective:   Jaszmine Linhart is a 85 y.o. female who presents for Medicare Annual (Subsequent) preventive examination.  Review of Systems:   Cardiac Risk Factors include: advanced age (>54men, >64 women);hypertension;dyslipidemia;family history of premature cardiovascular disease     Objective:     Vitals: There were no vitals taken for this visit.  There is no height or weight on file to calculate BMI.  Advanced Directives 06/10/2019 04/30/2019 10/10/2017 10/02/2017 08/03/2017 04/04/2017 11/02/2016  Does Patient Have a Medical Advance Directive? Yes Yes Yes Yes Yes Yes Yes  Type of Advance Directive Living will;Healthcare Power of [place];Out of facility DNR (pink MOST or yellow form) [place];Out of facility DNR (pink MOST or yellow form) [place];Out of facility DNR (pink MOST or yellow form) [place];Out of facility DNR (pink MOST or yellow form) [place];Out of facility DNR (pink MOST or yellow form) Out of facility DNR (pink MOST or yellow form);Healthcare Power of Attorney  Does patient want to make changes to medical advance directive? No - Patient declined No - Patient declined No - Patient declined No - Patient declined - - -  Copy of [place] in Chart? Yes - validated most recent copy scanned in chart (See row information) Yes - validated most recent copy scanned in chart (See row information) Yes Yes Yes Yes Yes  Pre-existing out of facility DNR order (yellow form or pink MOST form) - Yellow form placed in chart (order not valid for inpatient use) Yellow form placed in chart (order not valid for inpatient use) Yellow form placed in chart (order not valid for inpatient use);Pink MOST form placed in chart (order not valid for inpatient use) Yellow form placed in chart (order not valid for inpatient use) Yellow form placed in chart (order not valid for inpatient  use) Yellow form placed in chart (order not valid for inpatient use)    Tobacco Social History   Tobacco Use  Smoking Status Never Smoker  Smokeless Tobacco Never Used  Tobacco Comment   tobacco  - none     Counseling given: Not Answered Comment: tobacco  - none   Clinical Intake:  Pre-visit preparation completed: Yes  Pain : No/denies pain     BMI - recorded: 24.46 Nutritional Status: BMI of 19-24  Normal Nutritional Risks: None Diabetes: No  How often do you need to have someone help you when you read instructions, pamphlets, or other written materials from your doctor or pharmacy?: 1 - Never What is the last grade level you completed in school?: masters degree  Interpreter Needed?: No     Past Medical History:  Diagnosis Date   Actinic keratosis 09/23/2012   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 [facility] (R free wall Atach)   Atrophy of vulva 09/23/2012   Blepharochalasis 03/01/2011   Cardiac dysrhythmia, unspecified 11/10/2009   Dermatophytosis of nail 02/28/2008   Dizziness and giddiness 04/03/2007   Dysphagia, unspecified(787.20) 01/17/2012   Edema 11/21/2006   External hemorrhoids without mention of complication 123XX123   HTN (hypertension)    Hyperlipidemia    Insomnia, unspecified 11/18/2004   Internal hemorrhoids without mention of complication 99991111   Left bundle branch hemiblock    Loss of weight 03/05/2012   Myalgia and myositis, unspecified 01/05/2009   Orthostatic hypotension  03/22/2007   Other malaise and fatigue 03/05/2012   Pain in joint, site unspecified 02/28/2008   Palpitations 11/21/2006   Personality change due to conditions classified elsewhere    Rectocele 09/23/2012   Restless legs syndrome (RLS)    Rosacea 09/23/2012   Sebaceous cyst 03/01/2011   Thumb pain 11/20/2012   Unspecified  hereditary and idiopathic peripheral neuropathy    Past Surgical History:  Procedure Laterality Date   BLADDER SUSPENSION  1998   CATARACT EXTRACTION W/ INTRAOCULAR LENS IMPLANT Right 4/8//2008   Dr. Charise Killian   catheter ablation  2008   for atrial tachycardia   CYST EXCISION  1998   mucus cyst removal from finger   TOTAL ABDOMINAL HYSTERECTOMY  2001   uterine bleeding   Family History  Problem Relation Age of Onset   Heart disease Mother    Heart disease Father    Cancer Sister        breast   Social History   Socioeconomic History   Marital status: Widowed    Spouse name: Not on file   Number of children: 3   Years of education: Not on file   Highest education level: Not on file  Occupational History   Occupation: Retired Product manager: [employer] resource strain: Not hard at all   Food insecurity    Worry: Never true    Inability: Never true   Transportation needs    Medical: No    Non-medical: No  Tobacco Use   Smoking status: Never Smoker   Smokeless tobacco: Never Used   Tobacco comment: tobacco  - none  Substance and Sexual Activity   Alcohol use: Yes    Comment: 1-2 glasses of wine/gin daily    Drug use: No   Sexual activity: Never  Lifestyle   Physical activity    Days per week: 7 days    Minutes per session: 60 min   Stress: Only a little  Relationships   Social connections    Talks on phone: More than three times a week    Gets together: More than three times a week    Attends religious service: More than 4 times per year    Active member of club or organization: Yes    Attends meetings of clubs or organizations: 1 to 4 times per year    Relationship status: Widowed  Other Topics Concern   Not on file  Social History Narrative   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   diltiazem (TIAZAC) 240 MG 24 hr capsule Take 240 mg by mouth daily.   Homeopathic Products (ZICAM COLD REMEDY PO) Take 1 tablet by mouth daily.    losartan-hydrochlorothiazide (HYZAAR) 100-12.5 MG tablet TAKE 1 TABLET IN THE MORNING.   metoprolol succinate (TOPROL-XL) 50 MG 24 hr tablet TAKE 1/2 TABLET EVERY DAY.   Misc Natural Products (TURMERIC CURCUMIN) CAPS Take 1 capsule by mouth daily.     Multiple Vitamins-Minerals (PRESERVISION AREDS PO) Take 2 tablets by mouth daily.    simvastatin (ZOCOR) 40 MG tablet TAKE 1 TABLET DAILY TO LOWER CHOLESTEROL.  triamcinolone cream (KENALOG) 0.1 % as needed.    XARELTO 20 MG TABS tablet TAKE 1 TABLET ONCE DAILY WITH DINNER.   No facility-administered encounter medications on file as of 06/10/2019.     Activities of Daily Living In your present state of health, do you have any difficulty performing the following activities: 06/10/2019 10/30/2018  Hearing? N N  Vision? N N  Difficulty concentrating or making decisions? N N  Walking or climbing stairs? N N  Dressing or bathing? N N  Doing errands, shopping? N N  Preparing Food and eating ? N -  Using the Toilet? N -  In the past six months, have you accidently leaked urine? Y -  Comment wears a pad -  Do you have problems with loss of bowel control? N -  Managing your Medications? N -  Managing your Finances? N -  Housekeeping or managing your Housekeeping? N -  Some recent data might be hidden    Patient Care Team: Gayland Curry, DO as PCP - General (Geriatric Medicine) Community, [facility], [place] Donovan, NP as Nurse Practitioner (Geriatric Medicine) Adrian Prows, MD as Consulting Physician (Cardiology) Earlean Polka, MD as Consulting Physician (Ophthalmology)      Assessment:   This is a routine wellness examination for [place].  Exercise Activities and Dietary recommendations Current Exercise Habits: Home exercise routine;Structured exercise class, Type of exercise: calisthenics;strength training/weights(swim), Time (Minutes): 60, Frequency (Times/Week): 7, Weekly Exercise (Minutes/Week): 420, Intensity: Moderate  Goals     Patient Stated     To maintain current active lifestyle        Fall Risk Fall Risk  06/10/2019 04/30/2019 10/30/2018 04/25/2018 04/17/2018  Falls in the past year? 0 1 0 No No  Number falls in past yr: - 0 0 - -  Injury with Fall? - 0 0 - -  Comment - - - - -  Risk for fall due to : - - - - -  Follow up - - - - -   Is the patient's home free of loose throw rugs in walkways, pet beds, electrical cords, etc?   yes      Grab bars in the bathroom? yes      Handrails on the stairs?   yes      Adequate lighting?   yes  Timed Get Up and Go performed: na  Depression Screen PHQ 2/9 Scores 06/10/2019 04/30/2019 10/30/2018 04/17/2018  PHQ - 2 Score 0 0 0 0     Cognitive Function MMSE - Mini Mental State Exam 10/02/2017 10/04/2016 10/06/2015 10/06/2014  Orientation to time 5 5 4 5   Orientation to Place 5 5 5 5   Registration 3 3 3 3   Attention/ Calculation 5 5 5 5   Recall 3 3 3 3   Language- name 2 objects 2 2 2 2   Language- repeat 1 1 1 1   Language- follow 3 step command 3 3 3 3   Language- read & follow direction 1 1 1 1   Write a sentence 1 1 1 1   Copy design 1 1 1 1   Total score 30 30 29 30      6CIT Screen 06/10/2019  What Year? 0 points  What month? 0 points  What time? 0 points  Count back from 20 0 points  Months in reverse 0 points  Repeat phrase 0 points  Total Score 0    Immunization History  Administered Date(s) Administered   Influenza Whole 06/12/2012   Influenza, High Dose  Seasonal PF 05/23/2019   Influenza,inj,Quad PF,6+ Mos 05/13/2013, 06/07/2018   Influenza-Unspecified 05/27/2014, 04/24/2015,  04/13/2016, 04/24/2017   Pneumococcal Conjugate-13 05/27/2014   Pneumococcal Polysaccharide-23 08/14/1997   Td 04/15/2011   Tdap 07/15/2015   Zoster 08/15/2007   Zoster Recombinat (Shingrix) 02/12/2017, 06/28/2017    Qualifies for Shingles Vaccine?up to date on shingles  Screening Tests Health Maintenance  Topic Date Due   TETANUS/TDAP  07/14/2025   INFLUENZA VACCINE  Completed   DEXA SCAN  Completed   PNA vac Low Risk Adult  Completed    Cancer Screenings: Lung: Low Dose CT Chest recommended if Age 43-80 years, 30 pack-year currently smoking OR have quit w/in 15years. Patient does not qualify. Breast:  Up to date on Mammogram? Yes   Up to date of Bone Density/Dexa? Yes Colorectal: aged out  Additional Screenings: : Hepatitis C Screening: na     Plan:      I have personally reviewed and noted the following in the patients chart:    Medical and social history  Use of alcohol, tobacco or illicit drugs   Current medications and supplements  Functional ability and status  Nutritional status  Physical activity  Advanced directives  List of other physicians  Hospitalizations, surgeries, and ER visits in previous 12 months  Vitals  Screenings to include cognitive, depression, and falls  Referrals and appointments  In addition, I have reviewed and discussed with patient certain preventive protocols, quality metrics, and best practice recommendations. A written personalized care plan for preventive services as well as general preventive health recommendations were provided to patient.     Lauree Chandler, NP  06/10/2019

## 2019-06-10 NOTE — Progress Notes (Signed)
This service is provided via telemedicine  No vital signs collected/recorded due to the encounter was a telemedicine visit.   Location of patient (ex: home, work):  Home  Patient consents to a telephone visit:  Yes  Location of the provider (ex: office, home): [facility]  Name of any referring provider:  Hollace Kinnier, DO  Names of all persons participating in the telemedicine service and their role in the encounter:  Bonney Leitz, [place]; Sherrie Mustache, NP; patient  Time spent on call:  7.35 minutes   CMA time only

## 2019-06-11 ENCOUNTER — Other Ambulatory Visit: Payer: Self-pay

## 2019-06-11 ENCOUNTER — Other Ambulatory Visit: Payer: Self-pay | Admitting: Cardiology

## 2019-06-11 ENCOUNTER — Encounter: Payer: Self-pay | Admitting: Internal Medicine

## 2019-06-11 ENCOUNTER — Non-Acute Institutional Stay: Payer: Medicare Other | Admitting: Internal Medicine

## 2019-06-11 DIAGNOSIS — H6123 Impacted cerumen, bilateral: Secondary | ICD-10-CM | POA: Diagnosis not present

## 2019-06-11 NOTE — Progress Notes (Signed)
Location:  [place] of Service:  Clinic (12)  Provider: Burlene Montecalvo L. Mariea Clonts, D.O., C.M.D.  Code Status: DNR Goals of Care:  Advanced Directives 06/10/2019  Does Patient Have a Medical Advance Directive? Yes  Type of Advance Directive Living will;Healthcare Power of Attorney  Does patient want to make changes to medical advance directive? No - Patient declined  Copy of [place] in Chart? Yes - validated most recent copy scanned in chart (See row information)  Pre-existing out of facility DNR order (yellow form or pink MOST form) -     Chief Complaint  Patient presents with  . Acute Visit    Complains of ears stopped up with wax    HPI: Patient is a 85 y.o. female seen today for an acute visit for cerumen impaction.  She was coming routinely to clinic to have her ears disimpacted, but then that pattern stopped.  She notes she's had to ask people to repeat themselves more.  She requested ear lavage.  Past Medical History:  Diagnosis Date  . Actinic keratosis 09/23/2012  . 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 Arthralgia of temporomandibular joint 12/30/2009  . Atrial fibrillation ([place])   . Atrial tachycardia ([place])    s/p ablation 2008. [facility] (R free wall Atach)  . Atrophy of vulva 09/23/2012  . Blepharochalasis 03/01/2011  . Cardiac dysrhythmia, unspecified 11/10/2009  . Dermatophytosis of nail 02/28/2008  . Dizziness and giddiness 04/03/2007  . Dysphagia, unspecified(787.20) 01/17/2012  . Edema 11/21/2006  . External hemorrhoids without mention of complication 123XX123  . HTN (hypertension)   . Hyperlipidemia   . Insomnia, unspecified 11/18/2004  . Internal hemorrhoids without mention of complication 99991111  . Left bundle branch hemiblock   . Loss of weight 03/05/2012  . Myalgia and myositis, unspecified 01/05/2009  . Orthostatic hypotension 03/22/2007  . Other malaise and fatigue 03/05/2012  . Pain in joint, site unspecified 02/28/2008  .  Palpitations 11/21/2006  . Personality change due to conditions classified elsewhere   . Rectocele 09/23/2012  . Restless legs syndrome (RLS)   . Rosacea 09/23/2012  . Sebaceous cyst 03/01/2011  . Thumb pain 11/20/2012  . Unspecified hereditary and idiopathic peripheral neuropathy     Past Surgical History:  Procedure Laterality Date  . BLADDER SUSPENSION  1998  . CATARACT EXTRACTION W/ INTRAOCULAR LENS IMPLANT Right 4/8//2008   Dr. Charise Killian  . catheter ablation  2008   for atrial tachycardia  . CYST EXCISION  1998   mucus cyst removal from finger  . TOTAL ABDOMINAL HYSTERECTOMY  2001   uterine bleeding    Allergies  Allergen Reactions  . Erythromycin Nausea And Vomiting  . Flecainide Acetate     Postural hypotension  . Penicillins   . Simvastatin     Pains in arms and legs    Outpatient Encounter Medications as of 06/11/2019  Medication Sig  . cholecalciferol (VITAMIN D) 1000 UNITS tablet Take 1,000 Units by mouth daily.  Marland Kitchen diltiazem (TIAZAC) 240 MG 24 hr capsule Take 240 mg by mouth daily.  . Homeopathic Products (ZICAM COLD REMEDY PO) Take 1 tablet by mouth daily.   Marland Kitchen losartan-hydrochlorothiazide (HYZAAR) 100-12.5 MG tablet TAKE 1 TABLET IN THE MORNING.  . metoprolol succinate (TOPROL-XL) 50 MG 24 hr tablet TAKE 1/2 TABLET EVERY DAY.  Marland Kitchen Misc Natural Products (TURMERIC CURCUMIN) CAPS Take 1 capsule by mouth daily.    . Multiple Vitamins-Minerals (PRESERVISION AREDS PO) Take 2 tablets by mouth daily.   Marland Kitchen  simvastatin (ZOCOR) 40 MG tablet TAKE 1 TABLET DAILY TO LOWER CHOLESTEROL.  Marland Kitchen triamcinolone cream (KENALOG) 0.1 % as needed.   Alveda Reasons 20 MG TABS tablet TAKE 1 TABLET ONCE DAILY WITH DINNER.   No facility-administered encounter medications on file as of 06/11/2019.     Review of Systems:  Review of Systems  Constitutional: Negative for chills and fever.  HENT: Positive for hearing loss. Negative for ear discharge.   Respiratory: Negative for shortness of breath.    Cardiovascular: Negative for chest pain, palpitations and leg swelling.    Health Maintenance  Topic Date Due  . TETANUS/TDAP  07/14/2025  . INFLUENZA VACCINE  Completed  . DEXA SCAN  Completed  . PNA vac Low Risk Adult  Completed    Physical Exam: Vitals:   06/11/19 0925  BP: 128/72  Pulse: 100  Temp: 98.7 F (37.1 C)  TempSrc: Oral  SpO2: 97%  Weight: 150 lb 6.4 oz (68.2 kg)  Height: 5\' 5"  (1.651 m)   Body mass index is 25.03 kg/m. Physical Exam Vitals signs reviewed.  Constitutional:      General: She is not in acute distress.    Appearance: Normal appearance. She is normal weight. She is not toxic-appearing.  HENT:     Head: Normocephalic and atraumatic.     Right Ear: There is impacted cerumen.     Left Ear: There is impacted cerumen.  Cardiovascular:     Rate and Rhythm: Rhythm irregular.     Heart sounds: No murmur.  Pulmonary:     Effort: Pulmonary effort is normal.     Breath sounds: Normal breath sounds.  Neurological:     Mental Status: [name]     Labs reviewed: Basic Metabolic Panel: Recent Labs    04/22/19  NA 141  K 3.8  BUN 21  CREATININE 0.8   Liver Function Tests: Recent Labs    04/22/19 0500  AST 25  ALT 16  ALKPHOS 64   No results for input(s): LIPASE, AMYLASE in the last 8760 hours. No results for input(s): AMMONIA in the last 8760 hours. CBC: Recent Labs    04/22/19  WBC 6.0  HGB 14.1  HCT 41  PLT 216   Lipid Panel: Recent Labs    04/22/19  CHOL 160  HDL 59  LDLCALC 87  TRIG 73   No results found for: HGBA1C  Procedures since last visit: No results found.  Assessment/Plan 1. Bilateral hearing loss due to cerumen impaction -both ears were flushed with warm water and peroxide with resolution of cerumen impaction -left had a small amount of residual skin particles in canal -she reported hearing better -order written for q 3 mos cerumen disimpaction with clinic nurses at [place]  Labs/tests  ordered: ear lavage Next appt:  10/29/2019  Nicey Krah L. Koreena Joost, D.O. [facility] [address], [place] [address] Cell Phone (Mon-Fri 8am-5pm):  [phone] On Call:  [phone] & follow prompts after 5pm & weekends Office Phone:  [phone] Office Fax:  [phone]

## 2019-08-18 ENCOUNTER — Other Ambulatory Visit: Payer: Self-pay | Admitting: Cardiology

## 2019-09-02 ENCOUNTER — Other Ambulatory Visit: Payer: Self-pay | Admitting: Cardiology

## 2019-09-03 ENCOUNTER — Ambulatory Visit: Payer: Medicare Other | Admitting: Cardiology

## 2019-10-07 ENCOUNTER — Other Ambulatory Visit: Payer: Self-pay

## 2019-10-07 ENCOUNTER — Encounter: Payer: Self-pay | Admitting: Cardiology

## 2019-10-07 ENCOUNTER — Ambulatory Visit: Payer: Medicare PPO | Admitting: Cardiology

## 2019-10-07 VITALS — BP 140/87 | HR 107 | Temp 98.3°F | Resp 16 | Ht 65.0 in | Wt 152.4 lb

## 2019-10-07 DIAGNOSIS — I480 Paroxysmal atrial fibrillation: Secondary | ICD-10-CM

## 2019-10-07 DIAGNOSIS — I10 Essential (primary) hypertension: Secondary | ICD-10-CM | POA: Diagnosis not present

## 2019-10-07 NOTE — Progress Notes (Signed)
Primary Physician/Referring:  Gayland Curry, DO  Patient ID: Rebekah Graham, female    DOB: 11-22-1928, 86 y.o.   MRN: [id number]  Chief Complaint  Patient presents with  . Atrial Fibrillation  . Hypertension    6 month follow up   HPI:     Rebekah Graham  is a 86 y.o. female   Caucasian female with history of paroxysmal atrial fibrillation, hypertension and hyperlipidemia.  She is here on a six-month office visit, essentially asymptomatic. She has done well and has remained stable with good blood pressure control and good heart rate control. No bleeding diathesis on Xarelto. No symptoms of PND, or symptoms suggestive of TIA or claudication. She remains asymptomatic and no change from prior office visit.  This was a virtual visit.  Denies any melena, dizziness or syncope.  She died 86 years of age in 09-18-22.  Past Medical History:  Diagnosis Date  . Actinic keratosis 09/23/2012  . 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 Arthralgia of temporomandibular joint 12/30/2009  . Atrial fibrillation ([place])   . Atrial tachycardia ([place])    s/p ablation 2008. [facility] (R free wall Atach)  . Atrophy of vulva 09/23/2012  . Blepharochalasis 03/01/2011  . Cardiac dysrhythmia, unspecified 11/10/2009  . Dermatophytosis of nail 02/28/2008  . Dizziness and giddiness 04/03/2007  . Dysphagia, unspecified(787.20) 01/17/2012  . Edema 11/21/2006  . External hemorrhoids without mention of complication 11/57/2620  . HTN (hypertension)   . Hyperlipidemia   . Insomnia, unspecified 11/18/2004  . Internal hemorrhoids without mention of complication 3/55/9741  . Left bundle branch hemiblock   . Loss of weight 03/05/2012  . Myalgia and myositis, unspecified 01/05/2009  . Orthostatic hypotension 03/22/2007  . Other malaise and fatigue 03/05/2012  . Pain in joint, site unspecified 02/28/2008  . Palpitations 11/21/2006  . Personality change due to conditions classified elsewhere   . Rectocele 09/23/2012  . Restless legs  syndrome (RLS)   . Rosacea 09/23/2012  . Sebaceous cyst 03/01/2011  . Thumb pain 11/20/2012  . Unspecified hereditary and idiopathic peripheral neuropathy    Past Surgical History:  Procedure Laterality Date  . BLADDER SUSPENSION  1998  . CATARACT EXTRACTION W/ INTRAOCULAR LENS IMPLANT Right 4/8//2008   Dr. Charise Killian  . catheter ablation  2008   for atrial tachycardia  . CYST EXCISION  1998   mucus cyst removal from finger  . TOTAL ABDOMINAL HYSTERECTOMY  2001   uterine bleeding   Social History   Tobacco Use  . Smoking status: Never Smoker  . Smokeless tobacco: Never Used  . Tobacco comment: tobacco  - none  Substance Use Topics  . Alcohol use: Yes    Comment: 1-2 glasses of wine/gin daily     Family History  Problem Relation Age of Onset  . Heart disease Mother   . Heart disease Father   . Cancer Sister        breast    ROS  Review of Systems  Cardiovascular: Negative for chest pain, dyspnea on exertion and leg swelling.  Musculoskeletal: Positive for joint pain.  Gastrointestinal: Negative for melena.   Objective  Blood pressure 140/87, pulse (!) 107, temperature 98.3 F (36.8 C), temperature source Temporal, resp. rate 16, height '5\' 5"'$  (1.651 m), weight 152 lb 6.4 oz (69.1 kg), SpO2 97 %. Body mass index is 25.36 kg/m.    Physical Exam  Constitutional: She appears well-developed and well-nourished. No distress.  Eyes: Conjunctivae are normal.  Cardiovascular: Normal rate, regular  rhythm, normal heart sounds and intact distal pulses. Exam reveals no gallop.  No murmur Rebekah. Trace edema present. No JVD.  Pulmonary/Chest: Effort normal and breath sounds normal.  Abdominal: Soft. Bowel sounds are normal.   Radiology: No results found.  Laboratory examination:   CMP Latest Ref Rng & Units 04/22/2019 04/09/2018 09/26/2016  BUN 4 - 21 21 22(A) 16  Creatinine 0.5 - 1.1 0.8 0.9 0.7  Sodium 137 - 147 141 139 140  Potassium 3.4 - 5.3 3.8 3.9 4.1  Alkaline Phos 25 - 125  64 70 84  AST 13 - 35 '25 22 26  '$ ALT 7 - 35 '16 13 13   '$ CBC Latest Ref Rng & Units 04/22/2019 04/09/2018 08/03/2017  WBC - 6.0 5.0 7.1  Hemoglobin 12.0 - 16.0 14.1 14.5 12.8  Hematocrit 36 - 46 41 43 38.4  Platelets 150 - 399 216 249 412(H)   Lipid Panel     Component Value Date/Time   CHOL 160 04/22/2019 0000   TRIG 73 04/22/2019 0000   HDL 59 04/22/2019 0000   LDLCALC 87 04/22/2019 0000   HEMOGLOBIN A1C No results found for: HGBA1C, MPG TSH No results for input(s): TSH in the last 8760 hours. Medications   Current Outpatient Medications  Medication Instructions  . cholecalciferol (VITAMIN D) 1,000 Units, Oral, Daily  . diltiazem (TIAZAC) 240 mg, Oral, Daily  . Homeopathic Products (ZICAM COLD REMEDY PO) 1 tablet, Oral, Daily  . losartan-hydrochlorothiazide (HYZAAR) 100-12.5 MG tablet TAKE 1 TABLET IN THE MORNING.  . metoprolol succinate (TOPROL-XL) 50 MG 24 hr tablet TAKE 1/2 TABLET EVERY DAY.  Marland Kitchen Misc Natural Products (TURMERIC CURCUMIN) CAPS 1 capsule, Daily  . Multiple Vitamins-Minerals (PRESERVISION AREDS PO) 2 tablets, Oral, Daily  . simvastatin (ZOCOR) 40 MG tablet TAKE 1 TABLET DAILY TO LOWER CHOLESTEROL.  Marland Kitchen XARELTO 20 MG TABS tablet TAKE 1 TABLET ONCE DAILY WITH DINNER.   Cardiac Studies:   Echo- 07/13/2015 1. Left ventricle cavity is normal in size. Mild concentric hypertrophy of the left ventricle. Normal global wall motion. Calculated EF 66%. 2. Left atrial cavity is mildly dilated. 3. Trace aortic regurgitation. 4. Mild mitral regurgitation. 5. Mild tricuspid regurgitation. No evidence of pulmonary hypertension.  Assessment     ICD-10-CM   1. Paroxysmal atrial fibrillation (HCC)  I48.0 EKG 12-Lead  2. Essential hypertension  I10     EKG 10/07/2019: Sinus rhythm with first-degree AV block at the rate of 103 bpm, left atrial enlargement, nonspecific ST-T abnormality, cannot exclude inferior and lateral ischemia.    EKG 09/02/18/20: Probably ectopic atrial  rhythm at the rate of 99 bpm with borderline first-degree AV block.  Normal axis.  Nonspecific T abnormality.   Recommendations:    Rebekah Graham  is a 86 y.o. female   Caucasian female with history of paroxysmal atrial fibrillation, hypertension and hyperlipidemia. Patient is presently doing well and denies any palpitations, dizziness or syncope, tolerating anticoagulation well without any bleeding diathesis.  She is presently on 20 mg of Xarelto, I reviewed her labs from the PCP, EGFR is greater than 60 mL.  Leg edema has remained stable and very minimal.  She does have indeed ecchymosis but she is not bothered by this.  Blood pressure previously has been well controlled,  she is also scheduled for a complete physical next month with  Dr. Hollace Kinnier. Although tachycardic today, asymptomatic. Would recommend checking TSH.   From cardiac standpoint she remained stable, I will see her back in 6  months.  Adrian Prows, MD, FACC/ 10/11/2019, 2:52 PM  [facility]

## 2019-10-22 ENCOUNTER — Other Ambulatory Visit: Payer: Self-pay | Admitting: Cardiology

## 2019-10-22 ENCOUNTER — Other Ambulatory Visit: Payer: Self-pay | Admitting: Internal Medicine

## 2019-10-29 ENCOUNTER — Encounter: Payer: Self-pay | Admitting: Internal Medicine

## 2019-10-29 ENCOUNTER — Other Ambulatory Visit: Payer: Self-pay

## 2019-10-29 ENCOUNTER — Non-Acute Institutional Stay: Payer: Medicare PPO | Admitting: Internal Medicine

## 2019-10-29 VITALS — BP 118/52 | HR 65 | Temp 98.2°F | Ht 65.0 in | Wt 151.1 lb

## 2019-10-29 DIAGNOSIS — H353190 Nonexudative age-related macular degeneration, unspecified eye, stage unspecified: Secondary | ICD-10-CM | POA: Diagnosis not present

## 2019-10-29 DIAGNOSIS — I480 Paroxysmal atrial fibrillation: Secondary | ICD-10-CM

## 2019-10-29 DIAGNOSIS — Z0000 Encounter for general adult medical examination without abnormal findings: Secondary | ICD-10-CM | POA: Diagnosis not present

## 2019-10-29 DIAGNOSIS — I10 Essential (primary) hypertension: Secondary | ICD-10-CM | POA: Diagnosis not present

## 2019-10-29 DIAGNOSIS — E7800 Pure hypercholesterolemia, unspecified: Secondary | ICD-10-CM | POA: Diagnosis not present

## 2019-10-29 DIAGNOSIS — I872 Venous insufficiency (chronic) (peripheral): Secondary | ICD-10-CM

## 2019-10-29 NOTE — Progress Notes (Addendum)
Location:  [facility] Provider:  Anjanette Gilkey L. Mariea Clonts, D.O., C.M.D.  Code Status: DNR Goals of Care:  Advanced Directives 10/29/2019  Does Patient Have a Medical Advance Directive? Yes  Type of Advance Directive Out of facility DNR (pink MOST or yellow form)  Does patient want to make changes to medical advance directive? No - Patient declined  Copy of [place] in Chart? -  Pre-existing out of facility DNR order (yellow form or pink MOST form) Yellow form placed in chart (order not valid for inpatient use)   Chief Complaint  Patient presents with  . Annual Exam    annualy physical     HPI: Patient is a 86 y.o. female seen today for medical management of chronic diseases.    Has a wedding to go to in [facility] for family and finally gets to see them all.    She is taking zicam daily and asks if she can take claritin this time of year for watery eyes and runny nose.    Has seen Dr. Einar Gip.    She is doing water aerobics m/w/f and t/r/sat 4 of them have the pool 45 mins in the afternoon.  In the water 6 days per week.  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  She does do some water resistant weights.  She is also doing the regular exercise classes again with weightbearing (flex and flow and chairfit 1).  Takes vitamin D3.  Reviewed next bone density should be in Sept.    Dr. Einar Gip recommended checking TSH.  Was on thyroid meds a long time ago.    Vision:  Thinks she needs a new prescription.  Is wearing readers over her regular glasses to read small.  Has an eye exam, but not until July.    Past Medical History:  Diagnosis Date  . Actinic keratosis 09/23/2012  . 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 Arthralgia of temporomandibular joint 12/30/2009  . Atrial fibrillation ([place])   . Atrial tachycardia ([place])    s/p ablation 2008. [facility] (R free wall Atach)  . Atrophy of vulva 09/23/2012  . Blepharochalasis 03/01/2011  . Cardiac dysrhythmia,  unspecified 11/10/2009  . Dermatophytosis of nail 02/28/2008  . Dizziness and giddiness 04/03/2007  . Dysphagia, unspecified(787.20) 01/17/2012  . Edema 11/21/2006  . External hemorrhoids without mention of complication 123XX123  . HTN (hypertension)   . Hyperlipidemia   . Insomnia, unspecified 11/18/2004  . Internal hemorrhoids without mention of complication 99991111  . Left bundle branch hemiblock   . Loss of weight 03/05/2012  . Myalgia and myositis, unspecified 01/05/2009  . Orthostatic hypotension 03/22/2007  . Other malaise and fatigue 03/05/2012  . Pain in joint, site unspecified 02/28/2008  . Palpitations 11/21/2006  . Personality change due to conditions classified elsewhere   . Rectocele 09/23/2012  . Restless legs syndrome (RLS)   . Rosacea 09/23/2012  . Sebaceous cyst 03/01/2011  . Thumb pain 11/20/2012  . Unspecified hereditary and idiopathic peripheral neuropathy     Past Surgical History:  Procedure Laterality Date  . BLADDER SUSPENSION  1998  . CATARACT EXTRACTION W/ INTRAOCULAR LENS IMPLANT Right 4/8//2008   Dr. Charise Killian  . catheter ablation  2008   for atrial tachycardia  . CYST EXCISION  1998   mucus cyst removal from finger  . TOTAL ABDOMINAL HYSTERECTOMY  2001   uterine bleeding    Allergies  Allergen Reactions  . Erythromycin Nausea And Vomiting  .  Flecainide Acetate     Postural hypotension  . Penicillins     Outpatient Encounter Medications as of 10/29/2019  Medication Sig  . cholecalciferol (VITAMIN D) 1000 UNITS tablet Take 1,000 Units by mouth daily.  Marland Kitchen diltiazem (CARDIZEM CD) 240 MG 24 hr capsule TAKE (1) CAPSULE DAILY.  Marland Kitchen diltiazem (TIAZAC) 240 MG 24 hr capsule Take 240 mg by mouth daily.  . Homeopathic Products (ZICAM COLD REMEDY PO) Take 1 tablet by mouth daily.   Marland Kitchen losartan-hydrochlorothiazide (HYZAAR) 100-12.5 MG tablet TAKE 1 TABLET IN THE MORNING.  . metoprolol succinate (TOPROL-XL) 50 MG 24 hr tablet TAKE 1/2 TABLET EVERY DAY.  Marland Kitchen Misc Natural  Products (TURMERIC CURCUMIN) CAPS Take 1 capsule by mouth daily.    . Multiple Vitamins-Minerals (PRESERVISION AREDS PO) Take 2 tablets by mouth daily.   . simvastatin (ZOCOR) 40 MG tablet TAKE 1 TABLET DAILY TO LOWER CHOLESTEROL.  Marland Kitchen XARELTO 20 MG TABS tablet TAKE 1 TABLET ONCE DAILY WITH DINNER.   No facility-administered encounter medications on file as of 10/29/2019.    Review of Systems:  Review of Systems  Constitutional: Negative for chills, fever and malaise/fatigue.  HENT: Positive for hearing loss.        Rhinitis  Eyes: Negative for blurred vision.       Watery eyes  Cardiovascular: Positive for leg swelling. Negative for chest pain and palpitations.       Minimal edema recently (venous insufficiency)  Gastrointestinal: Negative for abdominal pain, blood in stool, constipation, diarrhea, heartburn and melena.  Genitourinary: Negative for dysuria.  Musculoskeletal: Positive for joint pain. Negative for falls.  Skin: Negative for itching and rash.  Neurological: Negative for dizziness, loss of consciousness and weakness.  Endo/Heme/Allergies: Bruises/bleeds easily.  Psychiatric/Behavioral: Negative for depression and memory loss. The patient is not nervous/anxious and does not have insomnia.     Health Maintenance  Topic Date Due  . TETANUS/TDAP  07/14/2025  . INFLUENZA VACCINE  Completed  . DEXA SCAN  Completed  . PNA vac Low Risk Adult  Completed    Physical Exam: Vitals:   10/29/19 0903  BP: (!) 118/52  Pulse: 65  Temp: 98.2 F (36.8 C)  TempSrc: Temporal  SpO2: 97%  Weight: 151 lb 1.6 oz (68.5 kg)  Height: 5\' 5"  (1.651 m)   Body mass index is 25.14 kg/m. Physical Exam Vitals reviewed.  Constitutional:      General: She is not in acute distress.    Appearance: Normal appearance. She is not toxic-appearing.  HENT:     Head: Normocephalic and atraumatic.     Right Ear: External ear normal.     Left Ear: External ear normal.     Ears:     Comments:  Mild HOH    Nose: Nose normal.     Mouth/Throat:     Pharynx: Oropharynx is clear.  Eyes:     Extraocular Movements: Extraocular movements intact.     Pupils: Pupils are equal, round, and reactive to light.  Cardiovascular:     Rate and Rhythm: Rhythm irregular.     Heart sounds: No murmur.  Pulmonary:     Effort: Pulmonary effort is normal.     Breath sounds: Normal breath sounds. No wheezing, rhonchi or rales.  Abdominal:     General: Bowel sounds are normal.  Musculoskeletal:        General: Normal range of motion.     Right lower leg: Edema present.     Left lower leg:  Edema present.  Skin:    General: Skin is warm and dry.     Comments: Venous insufficiency/hyperpigmentation  Neurological:     General: No focal deficit present.     Mental Status: She is alert and oriented to person, place, and time.     Cranial Nerves: No cranial nerve deficit.     Motor: No weakness.     Gait: Gait normal.  Psychiatric:        Mood and Affect: Mood normal.        Behavior: Behavior normal.        Thought Content: Thought content normal.        Judgment: Judgment normal.     Labs reviewed: Basic Metabolic Panel: Recent Labs    04/22/19 0000  NA 141  K 3.8  BUN 21  CREATININE 0.8   Liver Function Tests: Recent Labs    04/22/19 0500  AST 25  ALT 16  ALKPHOS 64   No results for input(s): LIPASE, AMYLASE in the last 8760 hours. No results for input(s): AMMONIA in the last 8760 hours. CBC: Recent Labs    04/22/19 0000  WBC 6.0  HGB 14.1  HCT 41  PLT 216   Lipid Panel: Recent Labs    04/22/19 0000  CHOL 160  HDL 59  LDLCALC 87  TRIG 73   Assessment/Plan 1. Venous insufficiency of both lower extremities -continue to elevate feet at rest, does not use compression hose (extremely active lady)  2. Paroxysmal atrial fibrillation (HCC) -rate controlled with toprol-xl, diltiazem and cont xarelto -check tsh per cardio recs  3. Pure hypercholesterolemia -lipids  at goal, cont zocor therapy  4. Essential hypertension -bp at goal with her losartan/hctz and her rate control meds  5. Macular degeneration of both eyes, unspecified type -stable she reports, needs ophtho f/u due to declining vision--she will arrange  6. Annual physical exam -uptodate on preventive care   We were going to get her mychart password reset so she could use her mychart.    Labs/tests ordered:  TSH next draw (requested by Dr. Einar Gip) Next appt:  F/u in 6 mos med [place]. Dontell Mian, D.O. [facility] [address], [place] [address] Cell Phone (Mon-Fri 8am-5pm):  [phone] On Call:  [phone] & follow prompts after 5pm & weekends Office Phone:  [phone] Office Fax:  [phone]

## 2019-11-03 ENCOUNTER — Other Ambulatory Visit: Payer: Self-pay | Admitting: Internal Medicine

## 2019-11-03 ENCOUNTER — Other Ambulatory Visit: Payer: Self-pay | Admitting: Cardiology

## 2019-11-03 NOTE — Telephone Encounter (Signed)
Just refilled on 11/03/19

## 2019-11-26 ENCOUNTER — Encounter: Payer: Self-pay | Admitting: Internal Medicine

## 2019-11-26 ENCOUNTER — Non-Acute Institutional Stay: Payer: Medicare PPO | Admitting: Internal Medicine

## 2019-11-26 ENCOUNTER — Other Ambulatory Visit: Payer: Self-pay

## 2019-11-26 VITALS — BP 132/62 | HR 60 | Temp 97.5°F | Ht 65.0 in | Wt 155.0 lb

## 2019-11-26 DIAGNOSIS — I10 Essential (primary) hypertension: Secondary | ICD-10-CM | POA: Diagnosis not present

## 2019-11-26 DIAGNOSIS — I480 Paroxysmal atrial fibrillation: Secondary | ICD-10-CM | POA: Diagnosis not present

## 2019-11-26 DIAGNOSIS — I872 Venous insufficiency (chronic) (peripheral): Secondary | ICD-10-CM | POA: Diagnosis not present

## 2019-11-26 NOTE — Progress Notes (Signed)
Location:  [facility] clinic Provider: Teneshia Hedeen L. Mariea Clonts, D.O., C.M.D.  Code Status: DNR Goals of Care:  Advanced Directives 11/26/2019  Does Patient Have a Medical Advance Directive? Yes  Type of Advance Directive Out of facility DNR (pink MOST or yellow form)  Does patient want to make changes to medical advance directive? No - Patient declined  Copy of [place] in Chart? -  Pre-existing out of facility DNR order (yellow form or pink MOST form) Pink MOST/Yellow Form most recent copy in chart - Physician notified to receive inpatient order   Chief Complaint  Patient presents with  . Acute Visit    Swelling legs     HPI: Patient is a 86 y.o. female seen today for an acute visit for bilateral swelling of her legs.  She has a h/o afib on xarelto, htn, venous insufficiency, hearing loss, hyperlipidemia and lives independently at [facility].    She was in [place] two weeks for her daughter's wedding.  Swelling has recurred--she'd not been having trouble for a few years.  She was on her feet a lot.  She had flown there and back with [employer].  No pain in legs.  Swelling is symmetric.  She also just fell the other day.  She was reaching for a paper on the ground and she reached to get it and reached too far, fell on stones.  Has scrape on right elbow, and left wrist.  She had an episode at the rehearsal dinner.  She'd had a glass and 1/2 of wine, lobster bisque and then lobster ravioli (late at night).  She thinks she fell asleep and her daughter was concerned she was having a stroke.  After her head was down a few mins, she felt fine.  EKG was done and normal.  No stroke findings at the hospital in [facility] and no problems since.     Past Medical History:  Diagnosis Date  . Actinic keratosis 09/23/2012  . 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 Arthralgia of temporomandibular joint 12/30/2009  . Atrial fibrillation ([place])   . Atrial tachycardia ([place])    s/p ablation 2008. [facility] (R free  wall Atach)  . Atrophy of vulva 09/23/2012  . Blepharochalasis 03/01/2011  . Cardiac dysrhythmia, unspecified 11/10/2009  . Dermatophytosis of nail 02/28/2008  . Dizziness and giddiness 04/03/2007  . Dysphagia, unspecified(787.20) 01/17/2012  . Edema 11/21/2006  . External hemorrhoids without mention of complication 123XX123  . HTN (hypertension)   . Hyperlipidemia   . Insomnia, unspecified 11/18/2004  . Internal hemorrhoids without mention of complication 99991111  . Left bundle branch hemiblock   . Loss of weight 03/05/2012  . Myalgia and myositis, unspecified 01/05/2009  . Orthostatic hypotension 03/22/2007  . Other malaise and fatigue 03/05/2012  . Pain in joint, site unspecified 02/28/2008  . Palpitations 11/21/2006  . Personality change due to conditions classified elsewhere   . Rectocele 09/23/2012  . Restless legs syndrome (RLS)   . Rosacea 09/23/2012  . Sebaceous cyst 03/01/2011  . Thumb pain 11/20/2012  . Unspecified hereditary and idiopathic peripheral neuropathy     Past Surgical History:  Procedure Laterality Date  . BLADDER SUSPENSION  1998  . CATARACT EXTRACTION W/ INTRAOCULAR LENS IMPLANT Right 4/8//2008   Dr. Charise Killian  . catheter ablation  2008   for atrial tachycardia  . CYST EXCISION  1998   mucus cyst removal from finger  . TOTAL ABDOMINAL HYSTERECTOMY  2001   uterine bleeding    Allergies  Allergen Reactions  . Erythromycin Nausea And Vomiting  . Flecainide Acetate     Postural hypotension  . Penicillins     Outpatient Encounter Medications as of 11/26/2019  Medication Sig  . cholecalciferol (VITAMIN D) 1000 UNITS tablet Take 1,000 Units by mouth daily.  Marland Kitchen diltiazem (CARDIZEM CD) 240 MG 24 hr capsule TAKE (1) CAPSULE DAILY.  Marland Kitchen diltiazem (TIAZAC) 240 MG 24 hr capsule Take 240 mg by mouth daily.  . Homeopathic Products (ZICAM COLD REMEDY PO) Take 1 tablet by mouth daily.   Marland Kitchen losartan-hydrochlorothiazide (HYZAAR) 100-12.5 MG tablet TAKE 1 TABLET IN THE MORNING.  .  metoprolol succinate (TOPROL-XL) 50 MG 24 hr tablet TAKE 1/2 TABLET EVERY DAY.  Marland Kitchen Misc Natural Products (TURMERIC CURCUMIN) CAPS Take 1 capsule by mouth daily.    . Multiple Vitamins-Minerals (PRESERVISION AREDS PO) Take 2 tablets by mouth daily.   . simvastatin (ZOCOR) 40 MG tablet TAKE 1 TABLET DAILY TO LOWER CHOLESTEROL.  Marland Kitchen XARELTO 20 MG TABS tablet TAKE 1 TABLET ONCE DAILY WITH DINNER.   No facility-administered encounter medications on file as of 11/26/2019.    Review of Systems:  Review of Systems  Constitutional: Negative for chills and fever.  HENT: Positive for hearing loss.   Eyes:       Glasses  Respiratory: Negative for cough and shortness of breath.   Cardiovascular: Positive for leg swelling. Negative for chest pain, palpitations, orthopnea, claudication and PND.  Gastrointestinal: Negative for abdominal pain.  Genitourinary: Negative for dysuria.  Musculoskeletal: Positive for falls.  Skin: Negative for itching and rash.  Neurological: Negative for dizziness, tingling, tremors, sensory change, speech change, focal weakness, loss of consciousness, weakness and headaches.  Endo/Heme/Allergies: Bruises/bleeds easily.  Psychiatric/Behavioral: Positive for memory loss. Negative for depression. The patient is not nervous/anxious and does not have insomnia.     Health Maintenance  Topic Date Due  . INFLUENZA VACCINE  03/14/2020  . TETANUS/TDAP  07/14/2025  . DEXA SCAN  Completed  . PNA vac Low Risk Adult  Completed    Physical Exam: Vitals:   11/26/19 1036  BP: (!) 146/92  Pulse: 60  Temp: (!) 97.5 F (36.4 C)  TempSrc: Temporal  SpO2: 99%  Weight: 155 lb (70.3 kg)  Height: 5\' 5"  (1.651 m)   Body mass index is 25.79 kg/m. Physical Exam Constitutional:      General: She is not in acute distress.    Appearance: Normal appearance. She is not toxic-appearing.  HENT:     Head: Normocephalic and atraumatic.  Eyes:     Comments: glasses  Cardiovascular:      Rate and Rhythm: Rhythm irregular.  Pulmonary:     Effort: Pulmonary effort is normal.     Breath sounds: Normal breath sounds.  Musculoskeletal:        General: Normal range of motion.     Right lower leg: Edema present.     Left lower leg: Edema present.     Comments: 1+ NONpitting edema of bilateral lower legs and ankles (pt reports it's much better than it had been--some wrinkling of skin of lower legs and feet suggests recent improvement in edema also)  Skin:    Findings: Bruising present.     Comments: Right elbow skin tear (skin only partially reapproximated, slight bleeding still getting on bandaid) and left wrist abrasion  Neurological:     General: No focal deficit present.     Mental Status: She is alert and oriented to person, place, and  time.     Comments: Some mild short-term memory loss  Psychiatric:        Mood and Affect: Mood normal.        Behavior: Behavior normal.     Labs reviewed: Basic Metabolic Panel: Recent Labs    04/22/19 0000  NA 141  K 3.8  BUN 21  CREATININE 0.8   Liver Function Tests: Recent Labs    04/22/19 0500  AST 25  ALT 16  ALKPHOS 64   No results for input(s): LIPASE, AMYLASE in the last 8760 hours. No results for input(s): AMMONIA in the last 8760 hours. CBC: Recent Labs    04/22/19 0000  WBC 6.0  HGB 14.1  HCT 41  PLT 216   Lipid Panel: Recent Labs    04/22/19 0000  CHOL 160  HDL 59  LDLCALC 87  TRIG 73   No results found for: HGBA1C   Assessment/Plan 1. Venous insufficiency of both lower extremities -appears the increased edema is due to travel -there are no concerning signs and gradual improvement -recommended continued activity and elevation of feet at rest -if persists, could implement compression hose  2. Essential hypertension -bp is well controlled with current regimen and w/o dizziness -seems her "syncope" if it was, was actually falling asleep as she reports after some heavy food and a very long  day vs her norm  3. Paroxysmal atrial fibrillation (HCC) -cont xarelto for anticoagulation and diltiazem and toprol-xl for rate control -no recent difficulty with her afib  Labs/tests ordered:  No new Next appt:  04/28/2020--keep regularly scheduled visits  Norvin Ohlin L. Elliot Simoneaux, D.O. [facility] [address], [place] [address] Cell Phone (Mon-Fri 8am-5pm):  [phone] On Call:  [phone] & follow prompts after 5pm & weekends Office Phone:  [phone] Office Fax:  [phone]

## 2019-12-02 ENCOUNTER — Other Ambulatory Visit: Payer: Self-pay | Admitting: Cardiology

## 2019-12-08 DIAGNOSIS — L814 Other melanin hyperpigmentation: Secondary | ICD-10-CM | POA: Diagnosis not present

## 2019-12-08 DIAGNOSIS — L308 Other specified dermatitis: Secondary | ICD-10-CM | POA: Diagnosis not present

## 2019-12-08 DIAGNOSIS — D0471 Carcinoma in situ of skin of right lower limb, including hip: Secondary | ICD-10-CM | POA: Diagnosis not present

## 2019-12-08 DIAGNOSIS — L821 Other seborrheic keratosis: Secondary | ICD-10-CM | POA: Diagnosis not present

## 2019-12-08 DIAGNOSIS — Z85828 Personal history of other malignant neoplasm of skin: Secondary | ICD-10-CM | POA: Diagnosis not present

## 2019-12-08 DIAGNOSIS — D1801 Hemangioma of skin and subcutaneous tissue: Secondary | ICD-10-CM | POA: Diagnosis not present

## 2019-12-08 DIAGNOSIS — D485 Neoplasm of uncertain behavior of skin: Secondary | ICD-10-CM | POA: Diagnosis not present

## 2019-12-08 DIAGNOSIS — D225 Melanocytic nevi of trunk: Secondary | ICD-10-CM | POA: Diagnosis not present

## 2019-12-15 DIAGNOSIS — L304 Erythema intertrigo: Secondary | ICD-10-CM | POA: Diagnosis not present

## 2019-12-15 DIAGNOSIS — Z85828 Personal history of other malignant neoplasm of skin: Secondary | ICD-10-CM | POA: Diagnosis not present

## 2020-01-20 ENCOUNTER — Other Ambulatory Visit: Payer: Self-pay | Admitting: Internal Medicine

## 2020-01-20 NOTE — Telephone Encounter (Signed)
Pharmacy requested refill °Pended Rx and sent to Dr. Reed for approval due to HIGH ALERT Warning.  °

## 2020-02-02 DIAGNOSIS — H1789 Other corneal scars and opacities: Secondary | ICD-10-CM | POA: Diagnosis not present

## 2020-02-02 DIAGNOSIS — H11001 Unspecified pterygium of right eye: Secondary | ICD-10-CM | POA: Diagnosis not present

## 2020-02-02 DIAGNOSIS — H353131 Nonexudative age-related macular degeneration, bilateral, early dry stage: Secondary | ICD-10-CM | POA: Diagnosis not present

## 2020-02-02 DIAGNOSIS — H52203 Unspecified astigmatism, bilateral: Secondary | ICD-10-CM | POA: Diagnosis not present

## 2020-02-05 ENCOUNTER — Ambulatory Visit: Payer: Medicare PPO | Admitting: Podiatry

## 2020-02-05 ENCOUNTER — Encounter: Payer: Self-pay | Admitting: Podiatry

## 2020-02-05 ENCOUNTER — Other Ambulatory Visit: Payer: Self-pay

## 2020-02-05 ENCOUNTER — Ambulatory Visit (INDEPENDENT_AMBULATORY_CARE_PROVIDER_SITE_OTHER): Payer: Medicare PPO

## 2020-02-05 DIAGNOSIS — M19071 Primary osteoarthritis, right ankle and foot: Secondary | ICD-10-CM

## 2020-02-05 DIAGNOSIS — M7751 Other enthesopathy of right foot: Secondary | ICD-10-CM | POA: Diagnosis not present

## 2020-02-05 DIAGNOSIS — M778 Other enthesopathies, not elsewhere classified: Secondary | ICD-10-CM

## 2020-02-08 NOTE — Progress Notes (Signed)
Subjective:  Patient ID: Rebekah Graham, female    DOB: 01-31-1929,  MRN: [id number] HPI Chief Complaint  Patient presents with  . Foot Pain    Dorsal midfoot/anterior to lateral ankle right - fell in Dec 2020, went to [facility], not broken, wore a boot awhile, still hurting and swelling  . New Patient (Initial Visit)    86 y.o. female presents with the above complaint.   ROS: Denies fever chills nausea vomiting muscle aches pains calf pain back pain chest pain shortness of breath.  Past Medical History:  Diagnosis Date  . Actinic keratosis 09/23/2012  . 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 Arthralgia of temporomandibular joint 12/30/2009  . Atrial fibrillation ([place])   . Atrial tachycardia ([place])    s/p ablation 2008. [facility] (R free wall Atach)  . Atrophy of vulva 09/23/2012  . Blepharochalasis 03/01/2011  . Cardiac dysrhythmia, unspecified 11/10/2009  . Dermatophytosis of nail 02/28/2008  . Dizziness and giddiness 04/03/2007  . Dysphagia, unspecified(787.20) 01/17/2012  . Edema 11/21/2006  . External hemorrhoids without mention of complication 81/85/6314  . HTN (hypertension)   . Hyperlipidemia   . Insomnia, unspecified 11/18/2004  . Internal hemorrhoids without mention of complication 9/70/2637  . Left bundle branch hemiblock   . Loss of weight 03/05/2012  . Myalgia and myositis, unspecified 01/05/2009  . Orthostatic hypotension 03/22/2007  . Other malaise and fatigue 03/05/2012  . Pain in joint, site unspecified 02/28/2008  . Palpitations 11/21/2006  . Personality change due to conditions classified elsewhere   . Rectocele 09/23/2012  . Restless legs syndrome (RLS)   . Rosacea 09/23/2012  . Sebaceous cyst 03/01/2011  . Thumb pain 11/20/2012  . Unspecified hereditary and idiopathic peripheral neuropathy    Past Surgical History:  Procedure Laterality Date  . BLADDER SUSPENSION  1998  . CATARACT EXTRACTION W/ INTRAOCULAR LENS IMPLANT Right 4/8//2008   Dr. Charise Killian  . catheter ablation   2008   for atrial tachycardia  . CYST EXCISION  1998   mucus cyst removal from finger  . TOTAL ABDOMINAL HYSTERECTOMY  2001   uterine bleeding    Current Outpatient Medications:  .  simvastatin (ZOCOR) 40 MG tablet, TAKE 1 TABLET DAILY TO LOWER CHOLESTEROL., Disp: 90 tablet, Rfl: 0 .  cholecalciferol (VITAMIN D) 1000 UNITS tablet, Take 1,000 Units by mouth daily., Disp: , Rfl:  .  diltiazem (CARDIZEM CD) 240 MG 24 hr capsule, TAKE (1) CAPSULE DAILY., Disp: 90 capsule, Rfl: 1 .  Homeopathic Products (ZICAM COLD REMEDY PO), Take 1 tablet by mouth daily. , Disp: , Rfl:  .  ketoconazole (NIZORAL) 2 % cream, , Disp: , Rfl:  .  losartan-hydrochlorothiazide (HYZAAR) 100-12.5 MG tablet, TAKE 1 TABLET IN THE MORNING., Disp: 90 tablet, Rfl: 1 .  metoprolol succinate (TOPROL-XL) 50 MG 24 hr tablet, TAKE 1/2 TABLET EVERY DAY., Disp: 30 tablet, Rfl: 4 .  Misc Natural Products (TURMERIC CURCUMIN) CAPS, Take 1 capsule by mouth daily.  , Disp: , Rfl:  .  Multiple Vitamins-Minerals (PRESERVISION AREDS PO), Take 2 tablets by mouth daily. , Disp: , Rfl:  .  XARELTO 20 MG TABS tablet, TAKE 1 TABLET ONCE DAILY WITH DINNER., Disp: 90 tablet, Rfl: 1  Allergies  Allergen Reactions  . Erythromycin Nausea And Vomiting  . Flecainide Acetate     Postural hypotension  . Penicillins    Review of Systems Objective:  There were no vitals filed for this visit.  General: Well developed, nourished, in no acute distress, alert and  oriented x3   Dermatological: Skin is warm, dry and supple bilateral. Nails x 10 are well maintained; remaining integument appears unremarkable at this time. There are no open sores, no preulcerative lesions, no rash or signs of infection present.  Vascular: Dorsalis Pedis artery and Posterior Tibial artery pedal pulses are 2/4 bilateral with immedate capillary fill time. Pedal hair growth present. No varicosities and no lower extremity edema present bilateral.   Neruologic: Grossly  intact via light touch bilateral. Vibratory intact via tuning fork bilateral. Protective threshold with Semmes Wienstein monofilament intact to all pedal sites bilateral. Patellar and Achilles deep tendon reflexes 2+ bilateral. No Babinski or clonus noted bilateral.   Musculoskeletal: No gross boney pedal deformities bilateral. No pain, crepitus, or limitation noted with foot and ankle range of motion bilateral. Muscular strength 5/5 in all groups tested bilateral.  Gait: Unassisted, Nonantalgic.    Radiographs:  Radiographs taken today demonstrate osseously mature individual moderate osteopenia is noted.  Flattening of the foot is noted.  Osteoarthritic changes primarily of the talonavicular joints and the tarsometatarsal joints.  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  Plan: Injected both of these areas today individually 20 mg Kenalog 5 mg of Marcaine total.  Tolerated procedure well without complications.  Follow-up with her as needed.     Tykera Skates T. [place], [place]

## 2020-03-03 DIAGNOSIS — Z85828 Personal history of other malignant neoplasm of skin: Secondary | ICD-10-CM | POA: Diagnosis not present

## 2020-03-03 DIAGNOSIS — L308 Other specified dermatitis: Secondary | ICD-10-CM | POA: Diagnosis not present

## 2020-03-11 DIAGNOSIS — H11001 Unspecified pterygium of right eye: Secondary | ICD-10-CM | POA: Diagnosis not present

## 2020-03-15 ENCOUNTER — Encounter: Payer: Self-pay | Admitting: Cardiology

## 2020-03-19 ENCOUNTER — Encounter (HOSPITAL_COMMUNITY): Payer: Self-pay | Admitting: Emergency Medicine

## 2020-03-19 ENCOUNTER — Other Ambulatory Visit: Payer: Self-pay

## 2020-03-19 ENCOUNTER — Emergency Department (HOSPITAL_COMMUNITY)
Admission: EM | Admit: 2020-03-19 | Discharge: 2020-03-19 | Disposition: A | Discharge status: Home / Self Care | Payer: Medicare PPO | Attending: Emergency Medicine | Admitting: Emergency Medicine

## 2020-03-19 DIAGNOSIS — Z743 Need for continuous supervision: Secondary | ICD-10-CM | POA: Diagnosis not present

## 2020-03-19 DIAGNOSIS — Y939 Activity, unspecified: Secondary | ICD-10-CM | POA: Diagnosis not present

## 2020-03-19 DIAGNOSIS — S51811A Laceration without foreign body of right forearm, initial encounter: Secondary | ICD-10-CM

## 2020-03-19 DIAGNOSIS — Z20828 Contact with and (suspected) exposure to other viral communicable diseases: Secondary | ICD-10-CM | POA: Diagnosis not present

## 2020-03-19 DIAGNOSIS — S59911A Unspecified injury of right forearm, initial encounter: Secondary | ICD-10-CM | POA: Diagnosis present

## 2020-03-19 DIAGNOSIS — Y92002 Bathroom of unspecified non-institutional (private) residence single-family (private) house as the place of occurrence of the external cause: Secondary | ICD-10-CM | POA: Diagnosis not present

## 2020-03-19 DIAGNOSIS — Y998 Other external cause status: Secondary | ICD-10-CM | POA: Insufficient documentation

## 2020-03-19 DIAGNOSIS — I10 Essential (primary) hypertension: Secondary | ICD-10-CM | POA: Insufficient documentation

## 2020-03-19 DIAGNOSIS — W208XXA Other cause of strike by thrown, projected or falling object, initial encounter: Secondary | ICD-10-CM | POA: Insufficient documentation

## 2020-03-19 DIAGNOSIS — R6889 Other general symptoms and signs: Secondary | ICD-10-CM | POA: Diagnosis not present

## 2020-03-19 DIAGNOSIS — Z79899 Other long term (current) drug therapy: Secondary | ICD-10-CM | POA: Insufficient documentation

## 2020-03-19 DIAGNOSIS — Z7901 Long term (current) use of anticoagulants: Secondary | ICD-10-CM | POA: Insufficient documentation

## 2020-03-19 DIAGNOSIS — I499 Cardiac arrhythmia, unspecified: Secondary | ICD-10-CM | POA: Diagnosis not present

## 2020-03-19 DIAGNOSIS — Z9189 Other specified personal risk factors, not elsewhere classified: Secondary | ICD-10-CM | POA: Diagnosis not present

## 2020-03-19 MED ORDER — LIDOCAINE-EPINEPHRINE (PF) 2 %-1:200000 IJ SOLN
10.0000 mL | Freq: Once | INTRAMUSCULAR | Status: AC
  Administered 2020-03-19: 10 mL
  Filled 2020-03-19: qty 20

## 2020-03-19 NOTE — Discharge Instructions (Addendum)
The stitches can come out in about a week.  Keep the dressing on for couple days to help with stopping the bleeding.

## 2020-03-19 NOTE — ED Notes (Signed)
Transportation arranged with [facility]

## 2020-03-19 NOTE — ED Provider Notes (Signed)
[facility] DEPT Provider Note   CSN: [id number] Arrival date & time: 03/19/20  1610     History Chief Complaint  Patient presents with   Fall   skin tear    Rebekah Graham is a 86 y.o. female.  HPI Patient presents after a skin tear on her right forearm.  Happened last night.  States she caught it on a clock in her bathroom.  Since then bleeding difficulty control.  She is on Xarelto for atrial fibrillation.  States she does feel a little unsteady.  No chest pain.  No trouble breathing.  Improved with pressure dressing by EMS.  No numbness weakness.  No other injury.    Past Medical History:  Diagnosis Date   Actinic keratosis 09/23/2012   Anal fissure and fistula 07/28/2008   Arthralgia of temporomandibular joint 12/30/2009   Atrial fibrillation (HCC)    Atrial tachycardia ([place])    s/p ablation 2008. [facility] (R free wall Atach)   Atrophy of vulva 09/23/2012   Blepharochalasis 03/01/2011   Cardiac dysrhythmia, unspecified 11/10/2009   Dermatophytosis of nail 02/28/2008   Dizziness and giddiness 04/03/2007   Dysphagia, unspecified(787.20) 01/17/2012   Edema 11/21/2006   External hemorrhoids without mention of complication 66/44/0347   HTN (hypertension)    Hyperlipidemia    Insomnia, unspecified 11/18/2004   Internal hemorrhoids without mention of complication 12/06/9561   Left bundle branch hemiblock    Loss of weight 03/05/2012   Myalgia and myositis, unspecified 01/05/2009   Orthostatic hypotension 03/22/2007   Other malaise and fatigue 03/05/2012   Pain in joint, site unspecified 02/28/2008   Palpitations 11/21/2006   Personality change due to conditions classified elsewhere    Rectocele 09/23/2012   Restless legs syndrome (RLS)    Rosacea 09/23/2012   Sebaceous cyst 03/01/2011   Thumb pain 11/20/2012   Unspecified hereditary and idiopathic peripheral neuropathy     Patient Active Problem List   Diagnosis Date Noted    Pure hypercholesterolemia 10/30/2018   Bilateral hearing loss due to cerumen impaction 04/04/2017   Dupuytren's disease of palm of right hand 04/04/2017   Acute gouty arthritis 12/30/2015   Squamous cell carcinoma, leg 12/30/2015   Nonexudative macular degeneration 01/04/2015   Nuclear sclerotic cataract of left eye 03/30/2014   Long term current use of anticoagulant therapy 03/30/2014   Impacted cerumen 09/29/2013   Diarrhea 09/11/2013   Hyperlipidemia    Lens replaced 03/19/2013   Pseudophakia 03/19/2013   H/O nonmelanoma skin cancer 03/04/2013   Ectropion 01/28/2013   Thumb pain 11/20/2012   Rosacea 09/23/2012   Cataract 09/19/2011   Corneal opacity 09/19/2011   Blepharochalasis 09/19/2011   Pterygium 09/19/2011   Left corneal scar 09/19/2011   Pseudoptosis 09/19/2011   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 Charise Killian   catheter ablation  2008   for atrial tachycardia   CYST EXCISION  1998   mucus cyst removal from finger   TOTAL ABDOMINAL HYSTERECTOMY  2001   uterine bleeding     OB History   No obstetric history on file.     Family History  Problem Relation Age of Onset   Heart  disease Mother    Heart disease Father    Cancer Sister        breast    Social History   Tobacco Use   Smoking status: Never Smoker   Smokeless tobacco: Never Used   Tobacco comment: tobacco  - none  Vaping Use   Vaping Use: Never used  Substance Use Topics   Alcohol use: Yes    Comment: 1-2 glasses of wine/gin daily    Drug use: No    Home Medications Prior to Admission medications    Medication Sig Start Date End Date Taking? Authorizing Provider  simvastatin (ZOCOR) 40 MG tablet TAKE 1 TABLET DAILY TO LOWER CHOLESTEROL. 01/20/20   Reed, Tiffany L, DO  cholecalciferol (VITAMIN D) 1000 UNITS tablet Take 1,000 Units by mouth daily.    [provider]  diltiazem (CARDIZEM CD) 240 MG 24 hr capsule TAKE (1) CAPSULE DAILY. 10/22/19   Tolia, Sunit, DO  Homeopathic Products (ZICAM COLD REMEDY PO) Take 1 tablet by mouth daily.     [provider]  ketoconazole (NIZORAL) 2 % cream  12/15/19   [provider]  losartan-hydrochlorothiazide (HYZAAR) 100-12.5 MG tablet TAKE 1 TABLET IN THE MORNING. 11/03/19   Reed, Tiffany L, DO  metoprolol succinate (TOPROL-XL) 50 MG 24 hr tablet TAKE 1/2 TABLET EVERY DAY. 12/02/19   Adrian Prows, MD  Misc Natural Products (TURMERIC CURCUMIN) CAPS Take 1 capsule by mouth daily.      [provider]  Multiple Vitamins-Minerals (PRESERVISION AREDS PO) Take 2 tablets by mouth daily.     [provider]  XARELTO 20 MG TABS tablet TAKE 1 TABLET ONCE DAILY WITH DINNER. 11/03/19   Adrian Prows, MD    Allergies    Erythromycin, Flecainide acetate, and Penicillins  Review of Systems   Review of Systems  Constitutional: Negative for appetite change and fever.  Respiratory: Negative for shortness of breath.   Cardiovascular: Negative for chest pain.  Gastrointestinal: Negative for anal bleeding.  Skin: Positive for wound.  Neurological:       Unsteadiness  Hematological: Bruises/bleeds easily.  Psychiatric/Behavioral: Negative for confusion.    Physical Exam Updated Vital Signs BP (!) 149/94    Pulse 69    Temp 98.3 F (36.8 C) (Oral)    Resp 16    SpO2 98%   Physical Exam Vitals and nursing note reviewed.  HENT:     Head: Normocephalic.  Cardiovascular:     Rate and Rhythm: Normal rate.  Pulmonary:     Breath sounds: No wheezing or rhonchi.  Musculoskeletal:     Cervical back: Neck supple.     Comments: On  dorsum of left forearm under dressing there is approximately 2 cm skin tear.  There is continued bleeding whenever pressure is released.  Bleeding is not pulsatile.  No underlying bony injury.  Skin:    Coloration: Skin is not pale.  Neurological:     Mental Status: She is alert and oriented to person, place, and time.     ED Results / Procedures / Treatments   Labs (all labs ordered are listed, but only abnormal results are displayed) Labs Reviewed - No data to display  EKG None  Radiology No results found.  Procedures .Marland KitchenLaceration Repair  Date/Time: 03/19/2020 5:42 PM Performed by: Davonna Belling, MD Authorized by: Davonna Belling, MD   Consent:    Consent obtained:  Verbal   Consent given by:  Patient   Risks discussed:  Infection, poor cosmetic  result, poor wound healing, retained foreign body, nerve damage and need for additional repair   Alternatives discussed:  No treatment, delayed treatment and observation Anesthesia (see MAR for exact dosages):    Anesthesia method:  Local infiltration   Local anesthetic:  Lidocaine 2% WITH epi Laceration details:    Location:  Shoulder/arm   Shoulder/arm location:  R lower arm   Length (cm):  2 Repair type:    Repair type:  Simple Pre-procedure details:    Preparation:  Patient was prepped and draped in usual sterile fashion and imaging obtained to evaluate for foreign bodies Exploration:    Hemostasis achieved with:  Epinephrine and direct pressure   Wound exploration: wound explored through full range of motion and entire depth of wound probed and visualized     Contaminated: no   Treatment:    Amount of cleaning:  Standard   Irrigation solution: Lidocaine out of syringe.   Irrigation method:  Syringe   Visualized foreign bodies/material removed: no   Skin repair:    Repair method:  Sutures   Suture size:  5-0   Wound skin closure material used: vicryl rapide.   Number of sutures:  6 Approximation:     Approximation:  Close Post-procedure details:    Dressing:  Sterile dressing (Surgifoam with sterile dressing wrapped with Coban.)   Patient tolerance of procedure:  Tolerated well, no immediate complications   (including critical care time)  Medications Ordered in ED Medications  lidocaine-EPINEPHrine (XYLOCAINE W/EPI) 2 %-1:200000 (PF) injection 10 mL (10 mLs Infiltration Given 03/19/20 1641)    ED Course  I have reviewed the triage vital signs and the nursing notes.  Pertinent labs & imaging results that were available during my care of the patient were reviewed by me and considered in my medical decision making (see chart for details).    MDM Rules/Calculators/A&P                          Patient was skin tear on forearm.  Had wound from yesterday.  Well-appearing.  I think with continued bleeding patient benefit from having the wound closed.  Sutures placed but still had some bleeding.  Surgifoam placed along with a pressure dressing.  Had hemostasis.  Initial plan was to get CBC, however there was a delay in collection and patient has reassuring vitals.  After discussion with patient will not do the CBC since she only feels a little lightheaded and vitals are good.  Will discharge home sutures out in about a week. Final Clinical Impression(s) / ED Diagnoses Final diagnoses:  Laceration of right forearm, initial encounter    Rx / DC Orders ED Discharge Orders    None       Davonna Belling, MD 03/19/20 1743

## 2020-03-19 NOTE — ED Triage Notes (Addendum)
The patient is from [facility]. Last she brushed against a towel rack and tore the skin on the right arm. Skin tear is about a quarter inch per EMS. She was unable to control the bleeding from the skin tear since last night. Bleeding stopped after EMS applied a trauma bandage. She is on Xarelto. A&Ox4   EMS vitals: 168/70 BP 70 HR

## 2020-04-05 ENCOUNTER — Ambulatory Visit: Payer: Medicare PPO | Admitting: Cardiology

## 2020-04-05 ENCOUNTER — Telehealth: Payer: Self-pay | Admitting: Cardiology

## 2020-04-05 ENCOUNTER — Encounter: Payer: Self-pay | Admitting: Cardiology

## 2020-04-05 ENCOUNTER — Other Ambulatory Visit: Payer: Self-pay

## 2020-04-05 VITALS — BP 142/75 | HR 69 | Resp 15 | Ht 65.0 in | Wt 146.0 lb

## 2020-04-05 DIAGNOSIS — R0989 Other specified symptoms and signs involving the circulatory and respiratory systems: Secondary | ICD-10-CM

## 2020-04-05 DIAGNOSIS — I480 Paroxysmal atrial fibrillation: Secondary | ICD-10-CM | POA: Diagnosis not present

## 2020-04-05 DIAGNOSIS — I10 Essential (primary) hypertension: Secondary | ICD-10-CM

## 2020-04-05 MED ORDER — OLMESARTAN MEDOXOMIL-HCTZ 40-12.5 MG PO TABS: 1.0000 | ORAL_TABLET | ORAL | 2 refills | Status: DC

## 2020-04-05 NOTE — Progress Notes (Signed)
Primary Physician/Referring:  Gayland Curry, DO  Patient ID: Rebekah Graham, female    DOB: 12-23-1928, 86 y.o.   MRN: [id number]  Chief Complaint  Patient presents with  . Follow-up    6 month  . Atrial Fibrillation   HPI:     Rebekah Graham  is a 86 y.o. female Caucasian female with history of paroxysmal atrial fibrillation, hypertension and hyperlipidemia. Patient is presently doing well and denies any palpitations, dizziness or syncope. She was seen in the ED after a right forearm laceration on 03/19/2020 and received stitches.  She would like to reduce the dose of Xarelto.  She is here on a six-month office visit, essentially asymptomatic. No symptoms of PND, or symptoms suggestive of TIA or claudication. She remains asymptomatic and no change from prior office visit. Denies any melena, dizziness or syncope.   Past Medical History:  Diagnosis Date  . Actinic keratosis 09/23/2012  . 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 Arthralgia of temporomandibular joint 12/30/2009  . Atrial fibrillation ([place])   . Atrial tachycardia ([place])    s/p ablation 2008. [facility] (R free wall Atach)  . Atrophy of vulva 09/23/2012  . Blepharochalasis 03/01/2011  . Cardiac dysrhythmia, unspecified 11/10/2009  . Dermatophytosis of nail 02/28/2008  . Dizziness and giddiness 04/03/2007  . Dysphagia, unspecified(787.20) 01/17/2012  . Edema 11/21/2006  . External hemorrhoids without mention of complication 56/31/4970  . HTN (hypertension)   . Hyperlipidemia   . Insomnia, unspecified 11/18/2004  . Internal hemorrhoids without mention of complication 2/63/7858  . Left bundle branch hemiblock   . Loss of weight 03/05/2012  . Myalgia and myositis, unspecified 01/05/2009  . Orthostatic hypotension 03/22/2007  . Other malaise and fatigue 03/05/2012  . Pain in joint, site unspecified 02/28/2008  . Palpitations 11/21/2006  . Personality change due to conditions classified elsewhere   . Rectocele 09/23/2012  . Restless legs  syndrome (RLS)   . Rosacea 09/23/2012  . Sebaceous cyst 03/01/2011  . Thumb pain 11/20/2012  . Unspecified hereditary and idiopathic peripheral neuropathy    Past Surgical History:  Procedure Laterality Date  . BLADDER SUSPENSION  1998  . CATARACT EXTRACTION W/ INTRAOCULAR LENS IMPLANT Right 4/8//2008   Dr. Charise Killian  . catheter ablation  2008   for atrial tachycardia  . CYST EXCISION  1998   mucus cyst removal from finger  . TOTAL ABDOMINAL HYSTERECTOMY  2001   uterine bleeding   Social History   Tobacco Use  . Smoking status: Never Smoker  . Smokeless tobacco: Never Used  . Tobacco comment: tobacco  - none  Substance Use Topics  . Alcohol use: Yes    Comment: 1-2 glasses of wine/gin daily     Family History  Problem Relation Age of Onset  . Heart disease Mother   . Heart disease Father   . Cancer Sister        breast    ROS  Review of Systems  Cardiovascular: Negative for chest pain, dyspnea on exertion and leg swelling.  Skin:       Easy bruising  Musculoskeletal: Positive for joint pain.  Gastrointestinal: Negative for melena.   Objective  Blood pressure (!) 142/75, pulse 69, resp. rate 15, height 5\' 5"  (1.651 m), weight 146 lb (66.2 kg), SpO2 97 %. Body mass index is 24.3 kg/m.    Physical Exam Constitutional:      General: She is not in acute distress.    Appearance: She is well-developed.  Eyes:  Conjunctiva/sclera: Conjunctivae normal.  Cardiovascular:     Rate and Rhythm: Normal rate and regular rhythm.     Pulses: Intact distal pulses.     Heart sounds: Normal heart sounds. No murmur Rebekah.  No gallop.      Comments: Trace edema present. No JVD. Pulmonary:     Effort: Pulmonary effort is normal.     Breath sounds: Normal breath sounds.  Abdominal:     General: Bowel sounds are normal.     Palpations: Abdomen is soft.    Radiology: No results found.  Laboratory examination:   CMP Latest Ref Rng & Units 04/22/2019 04/09/2018 09/26/2016  BUN 4 -  21 21 22(A) 16  Creatinine 0.5 - 1.1 0.8 0.9 0.7  Sodium 137 - 147 141 139 140  Potassium 3.4 - 5.3 3.8 3.9 4.1  Alkaline Phos 25 - 125 64 70 84  AST 13 - 35 25 22 26   ALT 7 - 35 16 13 13    CBC Latest Ref Rng & Units 04/22/2019 04/09/2018 08/03/2017  WBC - 6.0 5.0 7.1  Hemoglobin 12.0 - 16.0 14.1 14.5 12.8  Hematocrit 36 - 46 41 43 38.4  Platelets 150 - 399 216 249 412(H)   Lipid Panel     Component Value Date/Time   CHOL 160 04/22/2019 0000   TRIG 73 04/22/2019 0000   HDL 59 04/22/2019 0000   LDLCALC 87 04/22/2019 0000   HEMOGLOBIN A1C No results found for: HGBA1C, MPG TSH No results for input(s): TSH in the last 8760 hours.   Medications   Outpatient Medications Prior to Visit  Medication Sig Dispense Refill  . cholecalciferol (VITAMIN D) 1000 UNITS tablet Take 1,000 Units by mouth daily.    Marland Kitchen diltiazem (CARDIZEM CD) 240 MG 24 hr capsule TAKE (1) CAPSULE DAILY. 90 capsule 1  . Homeopathic Products (ZICAM COLD REMEDY PO) Take 1 tablet by mouth daily.     . metoprolol succinate (TOPROL-XL) 50 MG 24 hr tablet TAKE 1/2 TABLET EVERY DAY. 30 tablet 4  . Misc Natural Products (TURMERIC CURCUMIN) CAPS Take 1 capsule by mouth daily.      . Multiple Vitamins-Minerals (PRESERVISION AREDS PO) Take 2 tablets by mouth daily.     . simvastatin (ZOCOR) 40 MG tablet TAKE 1 TABLET DAILY TO LOWER CHOLESTEROL. 90 tablet 0  . XARELTO 20 MG TABS tablet TAKE 1 TABLET ONCE DAILY WITH DINNER. 90 tablet 1  . losartan-hydrochlorothiazide (HYZAAR) 100-12.5 MG tablet TAKE 1 TABLET IN THE MORNING. 90 tablet 1  . ketoconazole (NIZORAL) 2 % cream  (Patient not taking: Reported on 04/05/2020)     No facility-administered medications prior to visit.     Cardiac Studies:   Echo- 07/13/2015 1. Left ventricle cavity is normal in size. Mild concentric hypertrophy of the left ventricle. Normal global wall motion. Calculated EF 66%. 2. Left atrial cavity is mildly dilated. 3. Trace aortic regurgitation. 4.  Mild mitral regurgitation. 5. Mild tricuspid regurgitation. No evidence of pulmonary hypertension.  EKG:  EKG 04/05/2020: Normal sinus rhythm at rate of 73 bpm, normal axis. Poor R wave progression, probably normal variant but cannot exclude anteroseptal infarct old. No evidence of ischemia. Compared to 10/04/2019, probable sinus tachycardia with first-degree AV block and lateral ST depression no longer present. Suspect ectopic atrial rhythm.  EKG 09/02/18/20: Probably ectopic atrial rhythm at the rate of 99 bpm with borderline first-degree AV block.  Normal axis.  Nonspecific T abnormality.   Assessment     ICD-10-CM  1. Paroxysmal atrial fibrillation (HCC)  I48.0 EKG 12-Lead  2. Essential hypertension  I10 olmesartan-hydrochlorothiazide (BENICAR HCT) 40-12.5 MG tablet  3. Left carotid bruit  R09.89 PCV CAROTID DUPLEX (BILATERAL)    Medications Discontinued During This Encounter  Medication Reason  . ketoconazole (NIZORAL) 2 % cream Completed Course  . losartan-hydrochlorothiazide (HYZAAR) 100-12.5 MG tablet Change in therapy    Meds ordered this encounter  Medications  . olmesartan-hydrochlorothiazide (BENICAR HCT) 40-12.5 MG tablet    Sig: Take 1 tablet by mouth every morning.    Dispense:  30 tablet    Refill:  2    Do not fill Losartan HCT    Recommendations:    Rebekah Graham  is a 85 y.o. female   Caucasian female with history of paroxysmal atrial fibrillation, hypertension and hyperlipidemia. Patient is presently doing well and denies any palpitations, dizziness or syncope. She was seen in the ED after a right forearm laceration on 03/19/2020 and received stitches.  She would like to reduce the dose of Xarelto.  Blood pressure is elevated today, I will discontinue losartan HCT 100/12.5 mg and switch her to olmesartan HCT 40/12.5 mg in the morning, she is also scheduled for a complete physical next month with  Dr. Hollace Kinnier.  I may consider reducing the dose of Xarelto to  15 mg depending upon her renal function.  As labs can be performed at assisted living facility when orders are placed by Dr. Mariea Clonts I will request her to place orders.  I hear a new left carotid artery bruit.  Although 86 years of age, she remains active, I would like to obtain carotid artery duplex to evaluate for the same.  I would like to see her back in 4 to 6 weeks for follow-up.  She is presently on statins, continue the same.  Since addition of beta-blocker, heart rate has improved with regard to underlying sinus tachycardia.  Also she appears to be more in sinus rhythm than previously noted abnormal P waves.   Adrian Prows, MD, [facility] 04/05/2020, 3:43 PM Office: [phone]

## 2020-04-05 NOTE — Telephone Encounter (Signed)
Will do.   They will end up abstracted into her record in September because [facility] uses [facility].

## 2020-04-05 NOTE — Telephone Encounter (Signed)
Heather (nurse) from [facility] just stated there is no lab orders for [place] her next appointment is 05/05/2020. CMP, BMP and CBC ordered for 04/29/2020 @8am  at [facility]

## 2020-04-20 ENCOUNTER — Other Ambulatory Visit: Payer: Self-pay | Admitting: Internal Medicine

## 2020-04-20 ENCOUNTER — Other Ambulatory Visit: Payer: Self-pay | Admitting: Cardiology

## 2020-04-20 DIAGNOSIS — I10 Essential (primary) hypertension: Secondary | ICD-10-CM | POA: Diagnosis not present

## 2020-04-20 DIAGNOSIS — I480 Paroxysmal atrial fibrillation: Secondary | ICD-10-CM | POA: Diagnosis not present

## 2020-04-20 DIAGNOSIS — E785 Hyperlipidemia, unspecified: Secondary | ICD-10-CM | POA: Diagnosis not present

## 2020-04-20 LAB — BASIC METABOLIC PANEL
BUN: 19 (ref 4–21)
CO2: 24 — AB (ref 13–22)
Chloride: 99 (ref 99–108)
Creatinine: 0.8 (ref 0.5–1.1)
Glucose: 138
Potassium: 3.8 (ref 3.4–5.3)
Sodium: 136 — AB (ref 137–147)

## 2020-04-20 LAB — COMPREHENSIVE METABOLIC PANEL
Albumin: 4 (ref 3.5–5.0)
Calcium: 8.8 (ref 8.7–10.7)
Globulin: 2.4

## 2020-04-20 LAB — CBC AND DIFFERENTIAL
HCT: 246 — AB (ref 36–46)
HCT: 39 (ref 36–46)
Hemoglobin: 13 (ref 12.0–16.0)
Hemoglobin: 13 (ref 12.0–16.0)
WBC: 4.9
WBC: 4.9

## 2020-04-20 LAB — TSH
TSH: 3.19 (ref 0.41–5.90)
TSH: 3.19 (ref 0.41–5.90)

## 2020-04-20 LAB — HEPATIC FUNCTION PANEL
ALT: 14 (ref 7–35)
AST: 22 (ref 13–35)

## 2020-04-20 LAB — CBC
RBC: 3.97 (ref 3.87–5.11)
RBC: 3.97 (ref 3.87–5.11)

## 2020-04-23 ENCOUNTER — Encounter: Payer: Self-pay | Admitting: Internal Medicine

## 2020-04-28 ENCOUNTER — Encounter: Payer: Self-pay | Admitting: Internal Medicine

## 2020-05-03 ENCOUNTER — Other Ambulatory Visit: Payer: Medicare PPO

## 2020-05-03 ENCOUNTER — Telehealth: Payer: Self-pay

## 2020-05-03 ENCOUNTER — Encounter: Payer: Self-pay | Admitting: Internal Medicine

## 2020-05-03 ENCOUNTER — Ambulatory Visit: Payer: Medicare PPO

## 2020-05-03 ENCOUNTER — Other Ambulatory Visit: Payer: Self-pay

## 2020-05-03 DIAGNOSIS — R0989 Other specified symptoms and signs involving the circulatory and respiratory systems: Secondary | ICD-10-CM

## 2020-05-03 DIAGNOSIS — I6523 Occlusion and stenosis of bilateral carotid arteries: Secondary | ICD-10-CM | POA: Diagnosis not present

## 2020-05-03 NOTE — Telephone Encounter (Signed)
Per Dr. Mariea Clonts Electrolytes, kidneys, and liver are all normal. No Anemia and Thyroid is normal. Results given to patient.

## 2020-05-05 ENCOUNTER — Other Ambulatory Visit: Payer: Self-pay

## 2020-05-05 ENCOUNTER — Encounter: Payer: Self-pay | Admitting: Internal Medicine

## 2020-05-05 ENCOUNTER — Non-Acute Institutional Stay: Payer: Medicare PPO | Admitting: Internal Medicine

## 2020-05-05 VITALS — BP 140/72 | HR 65 | Temp 97.1°F | Ht 65.0 in | Wt 146.8 lb

## 2020-05-05 DIAGNOSIS — I10 Essential (primary) hypertension: Secondary | ICD-10-CM | POA: Diagnosis not present

## 2020-05-05 DIAGNOSIS — I4891 Unspecified atrial fibrillation: Secondary | ICD-10-CM

## 2020-05-05 DIAGNOSIS — D6869 Other thrombophilia: Secondary | ICD-10-CM

## 2020-05-05 DIAGNOSIS — E7800 Pure hypercholesterolemia, unspecified: Secondary | ICD-10-CM | POA: Diagnosis not present

## 2020-05-05 DIAGNOSIS — I480 Paroxysmal atrial fibrillation: Secondary | ICD-10-CM | POA: Diagnosis not present

## 2020-05-05 DIAGNOSIS — I872 Venous insufficiency (chronic) (peripheral): Secondary | ICD-10-CM | POA: Diagnosis not present

## 2020-05-05 DIAGNOSIS — D692 Other nonthrombocytopenic purpura: Secondary | ICD-10-CM | POA: Diagnosis not present

## 2020-05-05 NOTE — Progress Notes (Signed)
Location:  Occupational psychologist of Service:  Clinic (12)  Provider: Lucius Wise L. Mariea Clonts, D.O., C.M.D.  Code Status: DNR Goals of Care:  Advanced Directives 05/05/2020  Does Patient Have a Medical Advance Directive? Yes  Type of Paramedic of [place];Out of facility DNR (pink MOST or yellow form)  Does patient want to make changes to medical advance directive? No - Patient declined  Copy of [place] in Chart? Yes - validated most recent copy scanned in chart (See row information)  Pre-existing out of facility DNR order (yellow form or pink MOST form) Pink MOST/Yellow Form most recent copy in chart - Physician notified to receive inpatient order   Chief Complaint  Patient presents with  . Medical Management of Chronic Issues    5 month follow up  . Health Maintenance    influenza ([place])    HPI: Patient is a 86 y.o. female seen today for medical management of chronic diseases.    She just had over a week with her kids in the [place] in [place].  They went on boat rides.  Her outlook is great right now.  They did a tour of a [employer] and also went to [place] and got maple syrup.    She had her lab work before her trip and things were good.  She also had her carotid doppler done with Dr. Einar Gip.    She walks around campus, but does 6 days of water aerobics and the exercise classes--flex and flow.    She is somewhat careful about what she eats, also.    Dr. Einar Gip had revised her ARB medication--on a combo with hctz.    Legs were almost normal for a while and now right is a little more swollen than the left again.    Past Medical History:  Diagnosis Date  . Actinic keratosis 09/23/2012  . 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 Arthralgia of temporomandibular joint 12/30/2009  . Atrial fibrillation ([place])   . Atrial tachycardia ([place])    s/p ablation 2008. [facility] (R free wall Atach)  . Atrophy of vulva 09/23/2012    . Blepharochalasis 03/01/2011  . Cardiac dysrhythmia, unspecified 11/10/2009  . Dermatophytosis of nail 02/28/2008  . Dizziness and giddiness 04/03/2007  . Dysphagia, unspecified(787.20) 01/17/2012  . Edema 11/21/2006  . External hemorrhoids without mention of complication 34/28/7681  . HTN (hypertension)   . Hyperlipidemia   . Insomnia, unspecified 11/18/2004  . Internal hemorrhoids without mention of complication 1/57/2620  . Left bundle branch hemiblock   . Loss of weight 03/05/2012  . Myalgia and myositis, unspecified 01/05/2009  . Orthostatic hypotension 03/22/2007  . Other malaise and fatigue 03/05/2012  . Pain in joint, site unspecified 02/28/2008  . Palpitations 11/21/2006  . Personality change due to conditions classified elsewhere   . Rectocele 09/23/2012  . Restless legs syndrome (RLS)   . Rosacea 09/23/2012  . Sebaceous cyst 03/01/2011  . Thumb pain 11/20/2012  . Unspecified hereditary and idiopathic peripheral neuropathy     Past Surgical History:  Procedure Laterality Date  . BLADDER SUSPENSION  1998  . CATARACT EXTRACTION W/ INTRAOCULAR LENS IMPLANT Right 4/8//2008   Dr. Charise Killian  . catheter ablation  2008   for atrial tachycardia  . CYST EXCISION  1998   mucus cyst removal from finger  . TOTAL ABDOMINAL HYSTERECTOMY  2001   uterine bleeding    Allergies  Allergen Reactions  . Erythromycin Nausea And Vomiting  .  Flecainide Acetate     Postural hypotension  . Penicillins     Outpatient Encounter Medications as of 05/05/2020  Medication Sig  . cholecalciferol (VITAMIN D) 1000 UNITS tablet Take 1,000 Units by mouth daily.  Marland Kitchen diltiazem (CARDIZEM CD) 240 MG 24 hr capsule TAKE (1) CAPSULE DAILY.  Marland Kitchen Homeopathic Products (ZICAM COLD REMEDY PO) Take 1 tablet by mouth daily.   . metoprolol succinate (TOPROL-XL) 50 MG 24 hr tablet TAKE 1/2 TABLET EVERY DAY.  Marland Kitchen Misc Natural Products (TURMERIC CURCUMIN) CAPS Take 1 capsule by mouth daily.    . Multiple Vitamins-Minerals (PRESERVISION  AREDS PO) Take 2 tablets by mouth daily.   Marland Kitchen olmesartan-hydrochlorothiazide (BENICAR HCT) 40-12.5 MG tablet Take 1 tablet by mouth every morning.  . simvastatin (ZOCOR) 40 MG tablet TAKE 1 TABLET DAILY TO LOWER CHOLESTEROL.  Marland Kitchen XARELTO 20 MG TABS tablet TAKE 1 TABLET ONCE DAILY WITH DINNER.   No facility-administered encounter medications on file as of 05/05/2020.    Review of Systems:  Review of Systems  Constitutional: Negative for chills, fever and malaise/fatigue.  HENT: Positive for hearing loss. Negative for congestion.   Eyes: Negative for blurred vision.       Macular  Respiratory: Negative for cough and shortness of breath.   Cardiovascular: Positive for leg swelling. Negative for chest pain, palpitations, orthopnea and PND.  Gastrointestinal: Negative for abdominal pain, blood in stool and melena.  Genitourinary: Negative for dysuria.  Musculoskeletal: Negative for falls and joint pain.       Right knee occasionally wants to give way (neoprene support brace with straps recommended to support)  Skin: Negative for itching and rash.  Neurological: Negative for dizziness, loss of consciousness and weakness.  Endo/Heme/Allergies: Bruises/bleeds easily.       Due to eliquis  Psychiatric/Behavioral: Negative for depression. The patient is not nervous/anxious.     Health Maintenance  Topic Date Due  . INFLUENZA VACCINE  03/14/2020  . TETANUS/TDAP  07/14/2025  . DEXA SCAN  Completed  . COVID-19 Vaccine  Completed  . PNA vac Low Risk Adult  Completed    Physical Exam: Vitals:   05/05/20 0858  BP: 140/72  Pulse: 65  Temp: (!) 97.1 F (36.2 C)  TempSrc: Temporal  SpO2: 97%  Weight: 146 lb 12.8 oz (66.6 kg)  Height: 5\' 5"  (1.651 m)   Body mass index is 24.43 kg/m. Physical Exam Vitals reviewed.  Constitutional:      General: She is not in acute distress.    Appearance: Normal appearance. She is not toxic-appearing.  HENT:     Head: Normocephalic and atraumatic.    Cardiovascular:     Rate and Rhythm: Rhythm irregular.     Heart sounds: No murmur heard.   Pulmonary:     Effort: Pulmonary effort is normal.     Breath sounds: Normal breath sounds. No wheezing, rhonchi or rales.  Abdominal:     General: Bowel sounds are normal.     Tenderness: There is no abdominal tenderness. There is no guarding or rebound.  Musculoskeletal:        General: Normal range of motion.     Cervical back: Neck supple.     Right lower leg: Edema present.     Left lower leg: Edema (hyperpigmentation and right great than left NONpitting edema) present.  Skin:    General: Skin is warm and dry.  Neurological:     General: No focal deficit present.     Mental Status: She  is alert and oriented to person, place, and time.     Cranial Nerves: No cranial nerve deficit.     Motor: No weakness.     Gait: Gait abnormal.     Comments: Windy gait  Psychiatric:        Mood and Affect: Mood normal.        Behavior: Behavior normal.        Thought Content: Thought content normal.        Judgment: Judgment normal.     Labs reviewed: Basic Metabolic Panel: Recent Labs    04/20/20 0000  NA 136*  K 3.8  CL 99  CO2 24*  BUN 19  CREATININE 0.8  CALCIUM 8.8  TSH 3.19  3.19   Liver Function Tests: Recent Labs    04/20/20 0000  AST 22  ALT 14  ALBUMIN 4.0   No results for input(s): LIPASE, AMYLASE in the last 8760 hours. No results for input(s): AMMONIA in the last 8760 hours. CBC: Recent Labs    04/20/20 0000  WBC 4.9  4.9  HGB 13.0  13.0  HCT 39  246*   Lipid Panel: No results for input(s): CHOL, HDL, LDLCALC, TRIG, CHOLHDL, LDLDIRECT in the last 8760 hours. No results found for: HGBA1C  Assessment/Plan 1. Paroxysmal atrial fibrillation (HCC) -ongoing, cont xarelto and cardizem   2. Hypercoagulable state due to atrial fibrillation (HCC) -tolerating xarelto ok, does have easy bruising related to this and advanced age with thinning skin--senile  purpura ([place])  3. Venous insufficiency of both lower extremities -ongoing, right greater than left, slight worsening recently after travel -continue to elevate at rest, does have hctz in one bp pill -labs ok  4. Pure hypercholesterolemia -continues on zocor and tolerating well  5. Essential hypertension -bp at goal with current regimen, cont same and monitor, clinic nurse checking and staying 675 systolic and below  Labs/tests ordered:  Cbc, bmp before Next appt:  6 mos for CPE  Lambert Jeanty L. Rohith Fauth, D.O. [facility] [address], [place] [address] Cell Phone (Mon-Fri 8am-5pm):  [phone] On Call:  [phone] & follow prompts after 5pm & weekends Office Phone:  [phone] Office Fax:  [phone]

## 2020-05-06 ENCOUNTER — Other Ambulatory Visit: Payer: Self-pay | Admitting: Cardiology

## 2020-05-06 DIAGNOSIS — I6523 Occlusion and stenosis of bilateral carotid arteries: Secondary | ICD-10-CM

## 2020-05-12 NOTE — Progress Notes (Signed)
Primary Physician/Referring:  Gayland Curry, DO  Patient ID: Rebekah Graham, female    DOB: [birth year], 86 y.o.   MRN: [id number]  Chief Complaint  Patient presents with  . Follow-up    6 week  . Hypertension  . Carotid Bruit  . Atrial Fibrillation   HPI:     Rebekah Graham  is a 86 y.o. Caucasian female with history of paroxysmal atrial fibrillation, hypertension and hyperlipidemia.   Patient is presently doing well and denies any palpitations, dizziness or syncope. Presents for 6 week follow up on hypertension and carotid bruit. At last visit stopped losartan HCT 100/12.5 mg and switch her to olmesartan HCT 40/12.5 mg.  She recently made a trip to [place] and since then has noticed mild swelling in the legs and felt that her swelling was coming from related to medication switch.  No pain, no redness.  Past Medical History:  Diagnosis Date  . Actinic keratosis 09/23/2012  . 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 Arthralgia of temporomandibular joint 12/30/2009  . Atrial fibrillation ([place])   . Atrial tachycardia ([place])    s/p ablation 2008. [facility] (R free wall Atach)  . Atrophy of vulva 09/23/2012  . Blepharochalasis 03/01/2011  . Cardiac dysrhythmia, unspecified 11/10/2009  . Dermatophytosis of nail 02/28/2008  . Dizziness and giddiness 04/03/2007  . Dysphagia, unspecified(787.20) 01/17/2012  . Edema 11/21/2006  . External hemorrhoids without mention of complication 83/15/1761  . HTN (hypertension)   . Hyperlipidemia   . Insomnia, unspecified 11/18/2004  . Internal hemorrhoids without mention of complication 01/18/3709  . Left bundle branch hemiblock   . Loss of weight 03/05/2012  . Myalgia and myositis, unspecified 01/05/2009  . Orthostatic hypotension 03/22/2007  . Other malaise and fatigue 03/05/2012  . Pain in joint, site unspecified 02/28/2008  . Palpitations 11/21/2006  . Personality change due to conditions classified elsewhere   . Rectocele 09/23/2012  . Restless legs syndrome (RLS)    . Rosacea 09/23/2012  . Sebaceous cyst 03/01/2011  . Thumb pain 11/20/2012  . Unspecified hereditary and idiopathic peripheral neuropathy    Past Surgical History:  Procedure Laterality Date  . BLADDER SUSPENSION  1998  . CATARACT EXTRACTION W/ INTRAOCULAR LENS IMPLANT Right 4/8//2008   Dr. Charise Killian  . catheter ablation  2008   for atrial tachycardia  . CYST EXCISION  1998   mucus cyst removal from finger  . TOTAL ABDOMINAL HYSTERECTOMY  2001   uterine bleeding   Social History   Tobacco Use  . Smoking status: Never Smoker  . Smokeless tobacco: Never Used  . Tobacco comment: tobacco  - none  Substance Use Topics  . Alcohol use: Yes    Comment: 1-2 glasses of wine/gin daily     Family History  Problem Relation Age of Onset  . Heart disease Mother   . Heart disease Father   . Cancer Sister        breast    ROS  Review of Systems  Cardiovascular: Negative for chest pain, dyspnea on exertion and leg swelling.  Skin:       Easy bruising  Musculoskeletal: Positive for joint pain.  Gastrointestinal: Negative for melena.   Objective  Blood pressure 136/60, pulse (!) 59, resp. rate 15, height 5\' 5"  (1.651 m), weight 148 lb (67.1 kg), SpO2 98 %. Body mass index is 24.63 kg/m.    Physical Exam Constitutional:      General: She is not in acute distress.    Appearance: She  is well-developed.  Eyes:     Conjunctiva/sclera: Conjunctivae normal.  Cardiovascular:     Rate and Rhythm: Normal rate and regular rhythm.     Pulses: Intact distal pulses.     Heart sounds: Normal heart sounds. No murmur Rebekah.  No gallop.      Comments: 2+ ankle edema present. No JVD. Pulmonary:     Effort: Pulmonary effort is normal.     Breath sounds: Normal breath sounds.  Abdominal:     General: Bowel sounds are normal.     Palpations: Abdomen is soft.    Radiology: No results found.  Laboratory examination:   CMP Latest Ref Rng & Units 04/20/2020 04/22/2019 04/09/2018  BUN 4 - 21 19 21   22(A)  Creatinine 0.5 - 1.1 0.8 0.8 0.9  Sodium 137 - 147 136(A) 141 139  Potassium 3.4 - 5.3 3.8 3.8 3.9  Chloride 99 - 108 99 - -  CO2 13 - 22 24(A) - -  Calcium 8.7 - 10.7 8.8 - -  Alkaline Phos 25 - 125 - 64 70  AST 13 - 35 22 25 22   ALT 7 - 35 14 16 13    CBC Latest Ref Rng & Units 04/20/2020 04/20/2020 04/22/2019  WBC - 4.9 4.9 6.0  Hemoglobin 12.0 - 16.0 13.0 13.0 14.1  Hematocrit 36 - 46 39 246(A) 41  Platelets 150 - 399 - - 216   Lipid Panel     Component Value Date/Time   CHOL 160 04/22/2019 0000   TRIG 73 04/22/2019 0000   HDL 59 04/22/2019 0000   LDLCALC 87 04/22/2019 0000   HEMOGLOBIN A1C No results found for: HGBA1C, MPG TSH Recent Labs    04/20/20 0000  TSH 3.19  3.19    Medications   Outpatient Medications Prior to Visit  Medication Sig Dispense Refill  . cholecalciferol (VITAMIN D) 1000 UNITS tablet Take 1,000 Units by mouth daily.    Marland Kitchen diltiazem (CARDIZEM CD) 240 MG 24 hr capsule TAKE (1) CAPSULE DAILY. 90 capsule 0  . Homeopathic Products (ZICAM COLD REMEDY PO) Take 1 tablet by mouth daily.     . metoprolol succinate (TOPROL-XL) 50 MG 24 hr tablet TAKE 1/2 TABLET EVERY DAY. 30 tablet 4  . Misc Natural Products (TURMERIC CURCUMIN) CAPS Take 1 capsule by mouth daily.      . Multiple Vitamins-Minerals (PRESERVISION AREDS PO) Take 2 tablets by mouth daily.     Marland Kitchen olmesartan-hydrochlorothiazide (BENICAR HCT) 40-12.5 MG tablet Take 1 tablet by mouth every morning. 30 tablet 2  . XARELTO 20 MG TABS tablet TAKE 1 TABLET ONCE DAILY WITH DINNER. 90 tablet 1  . simvastatin (ZOCOR) 40 MG tablet TAKE 1 TABLET DAILY TO LOWER CHOLESTEROL. 90 tablet 0   No facility-administered medications prior to visit.     Cardiac Studies:   Echo- 07/13/2015 1. Left ventricle cavity is normal in size. Mild concentric hypertrophy of the left ventricle. Normal global wall motion. Calculated EF 66%. 2. Left atrial cavity is mildly dilated. 3. Trace aortic regurgitation. 4. Mild  mitral regurgitation. 5. Mild tricuspid regurgitation. No evidence of pulmonary hypertension.  Carotid artery duplex  05/03/2020: Minimal stenosis in the right internal carotid artery (minimal). Stenosis in the right external carotid artery (<50%). Stenosis in the left internal carotid artery (50-69%). Stenosis in the left external carotid artery (<50%). Antegrade right vertebral artery flow. Antegrade left vertebral artery flow. Follow up in six months is appropriate if clinically indicated.  EKG:    EKG  04/05/2020: Normal sinus rhythm at rate of 73 bpm, normal axis. Poor R wave progression, probably normal variant but cannot exclude anteroseptal infarct old. No evidence of ischemia. Compared to 10/04/2019, probable sinus tachycardia with first-degree AV block and lateral ST depression no longer present. Suspect ectopic atrial rhythm.  EKG 09/02/18/20: Probably ectopic atrial rhythm at the rate of 99 bpm with borderline first-degree AV block.  Normal axis.  Nonspecific T abnormality.   Assessment     ICD-10-CM   1. Essential hypertension  I10   2. Asymptomatic bilateral carotid artery stenosis  I65.23 rosuvastatin (CRESTOR) 20 MG tablet  3. Paroxysmal atrial fibrillation (HCC)  I48.0   4. Bilateral leg edema  R60.0 furosemide (LASIX) 20 MG tablet  5. Hypercholesteremia  E78.00 rosuvastatin (CRESTOR) 20 MG tablet    Lipid Panel With LDL/HDL Ratio    Medications Discontinued During This Encounter  Medication Reason  . simvastatin (ZOCOR) 40 MG tablet Change in therapy    Meds ordered this encounter  Medications  . furosemide (LASIX) 20 MG tablet    Sig: Take 1 tablet (20 mg total) by mouth daily as needed for edema (Take 1/2 table for leg swelling as needed).    Dispense:  30 tablet    Refill:  1  . rosuvastatin (CRESTOR) 20 MG tablet    Sig: Take 1 tablet (20 mg total) by mouth daily.    Dispense:  30 tablet    Refill:  2    Discontinue Simvastatin   Recommendations:     Rebekah Graham  is a 86 y.o. Caucasian female with history of paroxysmal atrial fibrillation, hypertension and hyperlipidemia.   Patient is presently doing well and denies any palpitations, dizziness or syncope. Presents for 6 week follow up on hypertension and carotid bruit.  On exam she does have mild bilateral leg edema that is pitting right is slightly worse than the left however there is no evidence of inflammation.  I suspect her recent travel to be the etiology for edema but diltiazem could also be contributing.  I reviewed the medications with the patient, as her heart rate is well controlled with regard to atrial fibrillation and she is also mostly maintaining sinus rhythm or ectopic atrial rhythm and is asymptomatic, would like to continue diltiazem along with low-dose beta-blocker combination.  As the edema is only very mild, this may completely resolve however she could certainly use furosemide on a as needed basis at 20 mg 1/2 tablet to a whole tablet as needed but no more than 1-2 times a week at most.  Patient is agreeable to this.  Since the change in the medication from losartan HCT to olmesartan HCT, blood pressure is improved.  I reviewed the results of the carotid artery duplex, she has moderate stenosis involving the left carotid artery, needs continued surveillance.  As her LDL is >70, will discontinue simvastatin and switch her to Rosuvastatin 20 mg daily.  Will obtain lipid profile testing in 2 months.  Otherwise I will see her back in 6 months for follow-up along with repeat carotid artery duplex.     Adrian Prows, MD, [facility] 05/13/2020, 5:14 PM Office: [phone]

## 2020-05-13 ENCOUNTER — Other Ambulatory Visit: Payer: Self-pay

## 2020-05-13 ENCOUNTER — Ambulatory Visit: Payer: Medicare PPO | Admitting: Cardiology

## 2020-05-13 ENCOUNTER — Encounter: Payer: Self-pay | Admitting: Cardiology

## 2020-05-13 VITALS — BP 136/60 | HR 59 | Resp 15 | Ht 65.0 in | Wt 148.0 lb

## 2020-05-13 DIAGNOSIS — R600 Localized edema: Secondary | ICD-10-CM

## 2020-05-13 DIAGNOSIS — I480 Paroxysmal atrial fibrillation: Secondary | ICD-10-CM

## 2020-05-13 DIAGNOSIS — E7800 Pure hypercholesterolemia, unspecified: Secondary | ICD-10-CM | POA: Diagnosis not present

## 2020-05-13 DIAGNOSIS — I6523 Occlusion and stenosis of bilateral carotid arteries: Secondary | ICD-10-CM

## 2020-05-13 DIAGNOSIS — I10 Essential (primary) hypertension: Secondary | ICD-10-CM | POA: Diagnosis not present

## 2020-05-13 MED ORDER — ROSUVASTATIN CALCIUM 20 MG PO TABS: 20.0000 mg | ORAL_TABLET | Freq: Every day | ORAL | 2 refills | Status: DC

## 2020-05-13 MED ORDER — FUROSEMIDE 20 MG PO TABS: 20.0000 mg | ORAL_TABLET | Freq: Every day | ORAL | 1 refills | Status: DC | PRN

## 2020-05-13 NOTE — Patient Instructions (Signed)
I have discontinued your simvastatin and I have switched you to rosuvastatin as I would like to have your LDL goal <70 mg.  I would like you to have blood work done in 2 months, sometime before or after Thanksgiving.  Orders have been placed at [facility].  Otherwise you are doing well, we will recheck your carotid artery blockage in 6 months and I would like to see you back then.

## 2020-05-22 ENCOUNTER — Other Ambulatory Visit: Payer: Self-pay | Admitting: Cardiology

## 2020-06-07 DIAGNOSIS — H11051 Peripheral pterygium, progressive, right eye: Secondary | ICD-10-CM | POA: Diagnosis not present

## 2020-06-07 DIAGNOSIS — H11001 Unspecified pterygium of right eye: Secondary | ICD-10-CM | POA: Diagnosis not present

## 2020-06-07 DIAGNOSIS — H11021 Central pterygium of right eye: Secondary | ICD-10-CM | POA: Diagnosis not present

## 2020-06-07 HISTORY — PX: PTERYGIUM EXCISION: SHX2273

## 2020-06-07 HISTORY — DX: Unspecified pterygium of right eye: H11.001

## 2020-06-08 DIAGNOSIS — H1189 Other specified disorders of conjunctiva: Secondary | ICD-10-CM | POA: Diagnosis not present

## 2020-06-10 ENCOUNTER — Encounter: Payer: Self-pay | Admitting: Nurse Practitioner

## 2020-06-10 ENCOUNTER — Other Ambulatory Visit: Payer: Self-pay

## 2020-06-10 ENCOUNTER — Ambulatory Visit (INDEPENDENT_AMBULATORY_CARE_PROVIDER_SITE_OTHER): Payer: Medicare PPO | Admitting: Nurse Practitioner

## 2020-06-10 ENCOUNTER — Telehealth: Payer: Self-pay

## 2020-06-10 DIAGNOSIS — Z0000 Encounter for general adult medical examination without abnormal findings: Secondary | ICD-10-CM

## 2020-06-10 DIAGNOSIS — E2839 Other primary ovarian failure: Secondary | ICD-10-CM

## 2020-06-10 NOTE — Patient Instructions (Signed)
Rebekah Graham , Thank you for taking time to come for your Medicare Wellness Visit. I appreciate your ongoing commitment to your health goals. Please review the following plan we discussed and let me know if I can assist you in the future.   Screening recommendations/referrals: Colonoscopy aged out Mammogram aged out Bone Density DUE, order placed at this time.  Recommended yearly ophthalmology/optometry visit for glaucoma screening and checkup Recommended yearly dental visit for hygiene and checkup  Vaccinations: Influenza vaccine: scheduled to get 10/29 Pneumococcal vaccine up to date Tdap vaccine up to date Shingles vaccine up to date COVID booster due    Advanced directives:  On file.   Conditions/risks identified: advanced age.   Next appointment: 1 year.    Preventive Care 28 Years and Older, Female Preventive care refers to lifestyle choices and visits with your health care provider that can promote health and wellness. What does preventive care include?  A yearly physical exam. This is also called an annual well check.  Dental exams once or twice a year.  Routine eye exams. Ask your health care provider how often you should have your eyes checked.  Personal lifestyle choices, including:  Daily care of your teeth and gums.  Regular physical activity.  Eating a healthy diet.  Avoiding tobacco and drug use.  Limiting alcohol use.  Practicing safe sex.  Taking low-dose aspirin every day.  Taking vitamin and mineral supplements as recommended by your health care provider. What happens during an annual well check? The services and screenings done by your health care provider during your annual well check will depend on your age, overall health, lifestyle risk factors, and family history of disease. Counseling  Your health care provider may ask you questions about your:  Alcohol use.  Tobacco use.  Drug use.  Emotional well-being.  Home and relationship  well-being.  Sexual activity.  Eating habits.  History of falls.  Memory and ability to understand (cognition).  Work and work Statistician.  Reproductive health. Screening  You may have the following tests or measurements:  Height, weight, and BMI.  Blood pressure.  Lipid and cholesterol levels. These may be checked every 5 years, or more frequently if you are over 81 years old.  Skin check.  Lung cancer screening. You may have this screening every year starting at age 93 if you have a 30-pack-year history of smoking and currently smoke or have quit within the past 15 years.  Fecal occult blood test (FOBT) of the stool. You may have this test every year starting at age 45.  Flexible sigmoidoscopy or colonoscopy. You may have a sigmoidoscopy every 5 years or a colonoscopy every 10 years starting at age 38.  Hepatitis C blood test.  Hepatitis B blood test.  Sexually transmitted disease (STD) testing.  Diabetes screening. This is done by checking your blood sugar (glucose) after you have not eaten for a while (fasting). You may have this done every 1-3 years.  Bone density scan. This is done to screen for osteoporosis. You may have this done starting at age 48.  Mammogram. This may be done every 1-2 years. Talk to your health care provider about how often you should have regular mammograms. Talk with your health care provider about your test results, treatment options, and if necessary, the need for more tests. Vaccines  Your health care provider may recommend certain vaccines, such as:  Influenza vaccine. This is recommended every year.  Tetanus, diphtheria, and acellular pertussis (Tdap, Td)  vaccine. You may need a Td booster every 10 years.  Zoster vaccine. You may need this after age 34.  Pneumococcal 13-valent conjugate (PCV13) vaccine. One dose is recommended after age 17.  Pneumococcal polysaccharide (PPSV23) vaccine. One dose is recommended after age  36. Talk to your health care provider about which screenings and vaccines you need and how often you need them. This information is not intended to replace advice given to you by your health care provider. Make sure you discuss any questions you have with your health care provider. Document Released: 08/27/2015 Document Revised: 04/19/2016 Document Reviewed: 06/01/2015 Elsevier Interactive Patient Education  2017 [place] Prevention in the Home Falls can cause injuries. They can happen to people of all ages. There are many things you can do to make your home safe and to help prevent falls. What can I do on the outside of my home?  Regularly fix the edges of walkways and driveways and fix any cracks.  Remove anything that might make you trip as you walk through a door, such as a raised step or threshold.  Trim any bushes or trees on the path to your home.  Use bright outdoor lighting.  Clear any walking paths of anything that might make someone trip, such as rocks or tools.  Regularly check to see if handrails are loose or broken. Make sure that both sides of any steps have handrails.  Any raised decks and porches should have guardrails on the edges.  Have any leaves, snow, or ice cleared regularly.  Use sand or salt on walking paths during winter.  Clean up any spills in your garage right away. This includes oil or grease spills. What can I do in the bathroom?  Use night lights.  Install grab bars by the toilet and in the tub and shower. Do not use towel bars as grab bars.  Use non-skid mats or decals in the tub or shower.  If you need to sit down in the shower, use a plastic, non-slip stool.  Keep the floor dry. Clean up any water that spills on the floor as soon as it happens.  Remove soap buildup in the tub or shower regularly.  Attach bath mats securely with double-sided non-slip rug tape.  Do not have throw rugs and other things on the floor that can make  you trip. What can I do in the bedroom?  Use night lights.  Make sure that you have a light by your bed that is easy to reach.  Do not use any sheets or blankets that are too big for your bed. They should not hang down onto the floor.  Have a firm chair that has side arms. You can use this for support while you get dressed.  Do not have throw rugs and other things on the floor that can make you trip. What can I do in the kitchen?  Clean up any spills right away.  Avoid walking on wet floors.  Keep items that you use a lot in easy-to-reach places.  If you need to reach something above you, use a strong step stool that has a grab bar.  Keep electrical cords out of the way.  Do not use floor polish or wax that makes floors slippery. If you must use wax, use non-skid floor wax.  Do not have throw rugs and other things on the floor that can make you trip. What can I do with my stairs?  Do not leave  any items on the stairs.  Make sure that there are handrails on both sides of the stairs and use them. Fix handrails that are broken or loose. Make sure that handrails are as long as the stairways.  Check any carpeting to make sure that it is firmly attached to the stairs. Fix any carpet that is loose or worn.  Avoid having throw rugs at the top or bottom of the stairs. If you do have throw rugs, attach them to the floor with carpet tape.  Make sure that you have a light switch at the top of the stairs and the bottom of the stairs. If you do not have them, ask someone to add them for you. What else can I do to help prevent falls?  Wear shoes that:  Do not have high heels.  Have rubber bottoms.  Are comfortable and fit you well.  Are closed at the toe. Do not wear sandals.  If you use a stepladder:  Make sure that it is fully opened. Do not climb a closed stepladder.  Make sure that both sides of the stepladder are locked into place.  Ask someone to hold it for you, if  possible.  Clearly mark and make sure that you can see:  Any grab bars or handrails.  First and last steps.  Where the edge of each step is.  Use tools that help you move around (mobility aids) if they are needed. These include:  Canes.  Walkers.  Scooters.  Crutches.  Turn on the lights when you go into a dark area. Replace any light bulbs as soon as they burn out.  Set up your furniture so you have a clear path. Avoid moving your furniture around.  If any of your floors are uneven, fix them.  If there are any pets around you, be aware of where they are.  Review your medicines with your doctor. Some medicines can make you feel dizzy. This can increase your chance of falling. Ask your doctor what other things that you can do to help prevent falls. This information is not intended to replace advice given to you by your health care provider. Make sure you discuss any questions you have with your health care provider. Document Released: 05/27/2009 Document Revised: 01/06/2016 Document Reviewed: 09/04/2014 Elsevier Interactive Patient Education  2017 [employer].

## 2020-06-10 NOTE — Telephone Encounter (Signed)
[name], ragin are scheduled for a virtual visit with your provider today.    Just as we do with appointments in the office, we must obtain your consent to participate.  Your consent will be active for this visit and any virtual visit you may have with one of our providers in the next 365 days.    If you have a MyChart account, I can also send a copy of this consent to you electronically.  All virtual visits are billed to your insurance company just like a traditional visit in the office.  As this is a virtual visit, video technology does not allow for your provider to perform a traditional examination.  This may limit your provider's ability to fully assess your condition.  If your provider identifies any concerns that need to be evaluated in person or the need to arrange testing such as labs, EKG, etc, we will make arrangements to do so.    Although advances in technology are sophisticated, we cannot ensure that it will always work on either your end or our end.  If the connection with a video visit is poor, we may have to switch to a telephone visit.  With either a video or telephone visit, we are not always able to ensure that we have a secure connection.   I need to obtain your verbal consent now.   Are you willing to proceed with your visit today?   Rebekah Graham has provided verbal consent on 06/10/2020 for a virtual visit (video or telephone).   Carroll Kinds, CMA 06/10/2020  10:05 AM

## 2020-06-10 NOTE — Progress Notes (Signed)
This service is provided via telemedicine  No vital signs collected/recorded due to the encounter was a telemedicine visit.   Location of patient (ex: home, work):  Home  Patient consents to a telephone visit:  Yes, see encounter dated 06/10/2020  Location of the provider (ex: office, home):  [place]  Name of any referring provider:  Hollace Kinnier, DO  Names of all persons participating in the telemedicine service and their role in the encounter:  Sherrie Mustache, Nurse Practitioner, Carroll Kinds, CMA, and patient.   Time spent on call:  8 minutes with medical assistant

## 2020-06-10 NOTE — Progress Notes (Signed)
Subjective:   Rebekah Graham is a 86 y.o. female who presents for Medicare Annual (Subsequent) preventive examination.  Review of Systems     Cardiac Risk Factors include: advanced age (>93men, >38 women);hypertension;dyslipidemia     Objective:    There were no vitals filed for this visit. There is no height or weight on file to calculate BMI.  Advanced Directives 06/10/2020 05/05/2020 11/26/2019 10/29/2019 06/10/2019 04/30/2019 10/10/2017  Does Patient Have a Medical Advance Directive? Yes Yes Yes Yes Yes Yes Yes  Type of Paramedic of [place];Out of facility DNR (pink MOST or yellow form);Living will [place];Out of facility DNR (pink MOST or yellow form) Out of facility DNR (pink MOST or yellow form) Out of facility DNR (pink MOST or yellow form) Living will;Healthcare Power of [place];Out of facility DNR (pink MOST or yellow form) [place];Out of facility DNR (pink MOST or yellow form)  Does patient want to make changes to medical advance directive? No - Patient declined No - Patient declined No - Patient declined No - Patient declined No - Patient declined No - Patient declined No - Patient declined  Copy of [place] in Chart? Yes - validated most recent copy scanned in chart (See row information) Yes - validated most recent copy scanned in chart (See row information) - - Yes - validated most recent copy scanned in chart (See row information) Yes - validated most recent copy scanned in chart (See row information) Yes  Pre-existing out of facility DNR order (yellow form or pink MOST form) Yellow form placed in chart (order not valid for inpatient use) Pink MOST/Yellow Form most recent copy in chart - Physician notified to receive inpatient order Pink MOST/Yellow Form most recent copy in chart - Physician notified to receive inpatient order Yellow form placed in chart (order not  valid for inpatient use) - Yellow form placed in chart (order not valid for inpatient use) Yellow form placed in chart (order not valid for inpatient use)    Current Medications (verified) Outpatient Encounter Medications as of 06/10/2020  Medication Sig  . cholecalciferol (VITAMIN D) 1000 UNITS tablet Take 1,000 Units by mouth daily.  Marland Kitchen diltiazem (CARDIZEM CD) 240 MG 24 hr capsule TAKE (1) CAPSULE DAILY.  . furosemide (LASIX) 20 MG tablet Take 1 tablet (20 mg total) by mouth daily as needed for edema (Take 1/2 table for leg swelling as needed).  . Homeopathic Products (ZICAM COLD REMEDY PO) Take 1 tablet by mouth daily.   . metoprolol succinate (TOPROL-XL) 50 MG 24 hr tablet TAKE 1/2 TABLET EVERY DAY.  Marland Kitchen Misc Natural Products (TURMERIC CURCUMIN) CAPS Take 1 capsule by mouth daily.    . Multiple Vitamins-Minerals (PRESERVISION AREDS PO) Take 2 tablets by mouth daily.   Marland Kitchen olmesartan-hydrochlorothiazide (BENICAR HCT) 40-12.5 MG tablet Take 1 tablet by mouth every morning.  . rosuvastatin (CRESTOR) 20 MG tablet Take 1 tablet (20 mg total) by mouth daily.  Alveda Reasons 20 MG TABS tablet TAKE 1 TABLET ONCE DAILY WITH DINNER.   No facility-administered encounter medications on file as of 06/10/2020.    Allergies (verified) Erythromycin, Flecainide acetate, and Penicillins   History: Past Medical History:  Diagnosis Date  . Actinic keratosis 09/23/2012  . 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 Arthralgia of temporomandibular joint 12/30/2009  . Atrial fibrillation ([place])   . Atrial tachycardia ([place])    s/p ablation 2008. [facility] (R free wall Atach)  .  Atrophy of vulva 09/23/2012  . Blepharochalasis 03/01/2011  . Cardiac dysrhythmia, unspecified 11/10/2009  . Dermatophytosis of nail 02/28/2008  . Dizziness and giddiness 04/03/2007  . Dysphagia, unspecified(787.20) 01/17/2012  . Edema 11/21/2006  . External hemorrhoids without mention of complication 25/95/6387  . HTN (hypertension)   . Hyperlipidemia    . Insomnia, unspecified 11/18/2004  . Internal hemorrhoids without mention of complication 5/64/3329  . Left bundle branch hemiblock   . Loss of weight 03/05/2012  . Myalgia and myositis, unspecified 01/05/2009  . Orthostatic hypotension 03/22/2007  . Other malaise and fatigue 03/05/2012  . Pain in joint, site unspecified 02/28/2008  . Palpitations 11/21/2006  . Personality change due to conditions classified elsewhere   . Pterygium eye, right 06/07/2020  . Rectocele 09/23/2012  . Restless legs syndrome (RLS)   . Rosacea 09/23/2012  . Sebaceous cyst 03/01/2011  . Thumb pain 11/20/2012  . Unspecified hereditary and idiopathic peripheral neuropathy    Past Surgical History:  Procedure Laterality Date  . BLADDER SUSPENSION  1998  . CATARACT EXTRACTION W/ INTRAOCULAR LENS IMPLANT Right 4/8//2008   Dr. Charise Killian  . catheter ablation  2008   for atrial tachycardia  . CYST EXCISION  1998   mucus cyst removal from finger  . PTERYGIUM EXCISION  06/07/2020  . TOTAL ABDOMINAL HYSTERECTOMY  2001   uterine bleeding   Family History  Problem Relation Age of Onset  . Heart disease Mother   . Heart disease Father   . Cancer Sister        breast   Social History   Socioeconomic History  . Marital status: Widowed    Spouse name: Not on file  . Number of children: 3  . Years of education: Not on file  . Highest education level: Not on file  Occupational History  . Occupation: Retired Product manager: [employer]  Tobacco Use  . Smoking status: Never Smoker  . Smokeless tobacco: Never Used  . Tobacco comment: tobacco  - none  Vaping Use  . Vaping Use: Never used  Substance and Sexual Activity  . Alcohol use: Yes    Comment: 1-2 glasses of wine/gin daily   . Drug use: No  . Sexual activity: Never  Other Topics Concern  . 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 Difficulty of Paying Living Expenses: Not on file  Food Insecurity:   . Worried About Charity fundraiser in the Last Year: Not on file  . Ran Out of Food in the Last Year: Not on file  Transportation Needs:   . Lack of Transportation (Medical): Not on file  . Lack of Transportation (Non-Medical): Not on file  Physical Activity:   . Days of Exercise per Week: Not on file  . Minutes of Exercise per Session: Not on file  Stress:   . Feeling of Stress : Not on file  Social Connections:   . Frequency of Communication with Friends and Family: Not on file  . Frequency of Social Gatherings with Friends and Family: Not on file  . Attends Religious Services: Not on file  . Active Member of Clubs or Organizations: Not on  file  . Attends Archivist Meetings: Not on file  . Marital Status: Not on file    Tobacco Counseling Counseling given: Not Answered Comment: tobacco  - none   Clinical Intake:  Pre-visit preparation completed: Yes  Pain : No/denies pain     BMI - recorded: 24 Nutritional Risks: None Diabetes: No  How often do you need to have someone help you when you read instructions, pamphlets, or other written materials from your doctor or pharmacy?: 1 - Never  Diabetic?no         Activities of Daily Living In your present state of health, do you have any difficulty performing the following activities: 06/10/2020  Hearing? N  Vision? N  Difficulty concentrating or making decisions? N  Walking or climbing stairs? N  Dressing or bathing? N  Doing errands, shopping? N  Preparing Food and eating ? N  Using the Toilet? N  In the past six months, have you accidently leaked urine? N  Do you have problems with loss of bowel control? N  Managing your Medications? N  Managing your Finances? N  Housekeeping or managing  your Housekeeping? N  Some recent data might be hidden    Patient Care Team: Gayland Curry, DO as PCP - General (Geriatric Medicine) [facility], [place] Donovan, NP as Nurse Practitioner (Geriatric Medicine) Adrian Prows, MD as Consulting Physician (Cardiology) Earlean Polka, MD as Consulting Physician (Ophthalmology)  Indicate any recent Medical Services you may have received from other than [facility] providers in the past year (date may be approximate).     Assessment:   This is a routine wellness examination for [place].  Hearing/Vision screen  Hearing Screening   125Hz  250Hz  500Hz  1000Hz  2000Hz  3000Hz  4000Hz  6000Hz  8000Hz   Right ear:           Left ear:           Comments: Patient has no hearing problems  Vision Screening Comments: Patient wears glasses.  Dietary issues and exercise activities discussed: Current Exercise Habits: Structured exercise class, Type of exercise: calisthenics;stretching;strength training/weights, Time (Minutes): 60, Frequency (Times/Week): 6, Weekly Exercise (Minutes/Week): 360  Goals    . Patient Stated     To maintain current active lifestyle       Depression Screen PHQ 2/9 Scores 06/10/2020 05/05/2020 11/26/2019 06/10/2019 04/30/2019 10/30/2018 04/17/2018  PHQ - 2 Score 0 0 0 0 0 0 0    Fall Risk Fall Risk  06/10/2020 05/05/2020 11/26/2019 10/29/2019 06/10/2019  Falls in the past year? 0 0 1 0 0  Number falls in past yr: 0 0 0 0 -  Injury with Fall? 0 0 1 0 -  Comment - - - - -  Risk for fall due to : - - - - -  Follow up - - - - -    Any stairs in or around the home? No  If so, are there any without handrails? No  Home free of loose throw rugs in walkways, pet beds, electrical cords, etc? Yes  Adequate lighting in your home to reduce risk of falls? Yes   ASSISTIVE DEVICES UTILIZED TO PREVENT FALLS:  Life alert? No  Use of a cane, walker or w/c? No  Grab bars in the bathroom? Yes  Shower chair or bench in shower?  No  Elevated toilet seat or a handicapped toilet? No   TIMED UP AND GO:  Was the test performed? No .    Cognitive Function: MMSE -  Mini Mental State Exam 10/02/2017 10/04/2016 10/06/2015 10/06/2014  Orientation to time 5 5 4 5   Orientation to Place 5 5 5 5   Registration 3 3 3 3   Attention/ Calculation 5 5 5 5   Recall 3 3 3 3   Language- name 2 objects 2 2 2 2   Language- repeat 1 1 1 1   Language- follow 3 step command 3 3 3 3   Language- read & follow direction 1 1 1 1   Write a sentence 1 1 1 1   Copy design 1 1 1 1   Total score 30 30 29 30      6CIT Screen 06/10/2020 06/10/2019  What Year? 0 points 0 points  What month? 0 points 0 points  What time? 0 points 0 points  Count back from 20 0 points 0 points  Months in reverse 0 points 0 points  Repeat phrase 0 points 0 points  Total Score 0 0    Immunizations Immunization History  Administered Date(s) Administered  . Influenza Whole 06/12/2012  . Influenza, High Dose Seasonal PF 05/23/2019  . Influenza,inj,Quad PF,6+ Mos 05/13/2013, 06/07/2018  . Influenza-Unspecified 05/27/2014, 04/24/2015, 04/13/2016, 04/24/2017  . Moderna SARS-COVID-2 Vaccination 08/26/2019, 09/23/2019  . Pneumococcal Conjugate-13 05/27/2014  . Pneumococcal Polysaccharide-23 08/14/1997  . Td 04/15/2011  . Tdap 07/15/2015  . Zoster 08/15/2007  . Zoster Recombinat (Shingrix) 02/12/2017, 06/28/2017    TDAP status: Up to date Flu vaccine scheduled. Pneumococcal vaccine status: Up to date Covid-19 vaccine status: Completed vaccines  Qualifies for Shingles Vaccine? Yes   Zostavax completed Yes   Shingrix Completed?: Yes  Screening Tests Health Maintenance  Topic Date Due  . INFLUENZA VACCINE  03/14/2020  . TETANUS/TDAP  07/14/2025  . DEXA SCAN  Completed  . COVID-19 Vaccine  Completed  . PNA vac Low Risk Adult  Completed    Health Maintenance  Health Maintenance Due  Topic Date Due  . INFLUENZA VACCINE  03/14/2020    Colorectal cancer  screening: No longer required.  Mammogram status: No longer required.  Bone Density status: Completed 2019. Results reflect: Bone density results: OSTEOPENIA. Repeat every 2 years.  Due at this time.  Lung Cancer Screening: (Low Dose CT Chest recommended if Age 49-80 years, 30 pack-year currently smoking OR have quit wna/in 15years.) does not qualify.   Lung Cancer Screening Referral: na  Additional Screening:  Hepatitis C Screening: does not qualify; Completed na  Vision Screening: Recommended annual ophthalmology exams for early detection of glaucoma and other disorders of the eye. Is the patient up to date with their annual eye exam?  Yes  Who is the provider or what is the name of the office in which the patient attends annual eye exams? DR Bowen If pt is not established with a provider, would they like to be referred to a provider to establish care? No .   Dental Screening: Recommended annual dental exams for proper oral hygiene  Community Resource Referral / Chronic Care Management: CRR required this visit?  No   CCM required this visit?  No      Plan:     I have personally reviewed and noted the following in the patient's chart:   . Medical and social history . Use of alcohol, tobacco or illicit drugs  . Current medications and supplements . Functional ability and status . Nutritional status . Physical activity . Advanced directives . List of other physicians . Hospitalizations, surgeries, and ER visits in previous 12 months . Vitals . Screenings to include cognitive,  depression, and falls . Referrals and appointments  In addition, I have reviewed and discussed with patient certain preventive protocols, quality metrics, and best practice recommendations. A written personalized care plan for preventive services as well as general preventive health recommendations were provided to patient.     Lauree Chandler, NP   06/10/2020    Virtual Visit via Telephone  Note  I connected with@ on 06/10/20 at 11:00 AM EDT by telephone and verified that I am speaking with the correct person using two identifiers.  Location: Patient: home Provider:  [place]   I discussed the limitations, risks, security and privacy concerns of performing an evaluation and management service by telephone and the availability of in person appointments. I also discussed with the patient that there may be a patient responsible charge related to this service. The patient expressed understanding and agreed to proceed.   I discussed the assessment and treatment plan with the patient. The patient was provided an opportunity to ask questions and all were answered. The patient agreed with the plan and demonstrated an understanding of the instructions.   The patient was advised to call back or seek an in-person evaluation if the symptoms worsen or if the condition fails to improve as anticipated.  I provided 16 minutes of non-face-to-face time during this encounter.  Carlos American. Harle Battiest Avs printed and mailed

## 2020-06-11 DIAGNOSIS — D225 Melanocytic nevi of trunk: Secondary | ICD-10-CM | POA: Diagnosis not present

## 2020-06-11 DIAGNOSIS — D0472 Carcinoma in situ of skin of left lower limb, including hip: Secondary | ICD-10-CM | POA: Diagnosis not present

## 2020-06-11 DIAGNOSIS — D692 Other nonthrombocytopenic purpura: Secondary | ICD-10-CM | POA: Diagnosis not present

## 2020-06-11 DIAGNOSIS — L814 Other melanin hyperpigmentation: Secondary | ICD-10-CM | POA: Diagnosis not present

## 2020-06-11 DIAGNOSIS — D1801 Hemangioma of skin and subcutaneous tissue: Secondary | ICD-10-CM | POA: Diagnosis not present

## 2020-06-11 DIAGNOSIS — Z85828 Personal history of other malignant neoplasm of skin: Secondary | ICD-10-CM | POA: Diagnosis not present

## 2020-06-11 DIAGNOSIS — L821 Other seborrheic keratosis: Secondary | ICD-10-CM | POA: Diagnosis not present

## 2020-06-11 DIAGNOSIS — L570 Actinic keratosis: Secondary | ICD-10-CM | POA: Diagnosis not present

## 2020-06-21 DIAGNOSIS — M85851 Other specified disorders of bone density and structure, right thigh: Secondary | ICD-10-CM | POA: Diagnosis not present

## 2020-06-21 DIAGNOSIS — M85852 Other specified disorders of bone density and structure, left thigh: Secondary | ICD-10-CM | POA: Diagnosis not present

## 2020-06-25 ENCOUNTER — Telehealth: Payer: Self-pay

## 2020-06-25 NOTE — Telephone Encounter (Signed)
Per Dr. Mariea Clonts Bone density- osteopenia is stable. No changes needed. Patient was given results

## 2020-07-09 ENCOUNTER — Other Ambulatory Visit: Payer: Self-pay | Admitting: Cardiology

## 2020-07-09 DIAGNOSIS — I10 Essential (primary) hypertension: Secondary | ICD-10-CM

## 2020-07-09 DIAGNOSIS — R600 Localized edema: Secondary | ICD-10-CM

## 2020-07-13 DIAGNOSIS — E7800 Pure hypercholesterolemia, unspecified: Secondary | ICD-10-CM | POA: Diagnosis not present

## 2020-07-22 ENCOUNTER — Other Ambulatory Visit: Payer: Self-pay | Admitting: Cardiology

## 2020-08-09 ENCOUNTER — Other Ambulatory Visit: Payer: Self-pay | Admitting: Cardiology

## 2020-08-09 DIAGNOSIS — R600 Localized edema: Secondary | ICD-10-CM

## 2020-08-09 DIAGNOSIS — E7800 Pure hypercholesterolemia, unspecified: Secondary | ICD-10-CM

## 2020-08-09 DIAGNOSIS — I10 Essential (primary) hypertension: Secondary | ICD-10-CM

## 2020-08-09 DIAGNOSIS — I6523 Occlusion and stenosis of bilateral carotid arteries: Secondary | ICD-10-CM

## 2020-08-17 ENCOUNTER — Other Ambulatory Visit: Payer: Self-pay | Admitting: Cardiology

## 2020-08-28 ENCOUNTER — Other Ambulatory Visit: Payer: Self-pay | Admitting: Cardiology

## 2020-08-28 DIAGNOSIS — E7800 Pure hypercholesterolemia, unspecified: Secondary | ICD-10-CM

## 2020-08-28 DIAGNOSIS — I6523 Occlusion and stenosis of bilateral carotid arteries: Secondary | ICD-10-CM

## 2020-09-02 DIAGNOSIS — H04123 Dry eye syndrome of bilateral lacrimal glands: Secondary | ICD-10-CM | POA: Diagnosis not present

## 2020-09-10 ENCOUNTER — Other Ambulatory Visit: Payer: Self-pay | Admitting: Cardiology

## 2020-09-10 DIAGNOSIS — R600 Localized edema: Secondary | ICD-10-CM

## 2020-09-16 ENCOUNTER — Other Ambulatory Visit: Payer: Self-pay | Admitting: Cardiology

## 2020-09-16 DIAGNOSIS — I10 Essential (primary) hypertension: Secondary | ICD-10-CM

## 2020-10-04 ENCOUNTER — Encounter: Payer: Self-pay | Admitting: Internal Medicine

## 2020-10-05 DIAGNOSIS — H04123 Dry eye syndrome of bilateral lacrimal glands: Secondary | ICD-10-CM | POA: Diagnosis not present

## 2020-10-13 ENCOUNTER — Other Ambulatory Visit: Payer: Self-pay | Admitting: Cardiology

## 2020-10-13 DIAGNOSIS — E7800 Pure hypercholesterolemia, unspecified: Secondary | ICD-10-CM

## 2020-10-13 DIAGNOSIS — I6523 Occlusion and stenosis of bilateral carotid arteries: Secondary | ICD-10-CM

## 2020-10-14 ENCOUNTER — Ambulatory Visit: Payer: Medicare PPO

## 2020-10-14 ENCOUNTER — Other Ambulatory Visit: Payer: Self-pay

## 2020-10-14 DIAGNOSIS — I6523 Occlusion and stenosis of bilateral carotid arteries: Secondary | ICD-10-CM | POA: Diagnosis not present

## 2020-10-14 DIAGNOSIS — H04123 Dry eye syndrome of bilateral lacrimal glands: Secondary | ICD-10-CM | POA: Diagnosis not present

## 2020-10-17 ENCOUNTER — Other Ambulatory Visit: Payer: Self-pay | Admitting: Cardiology

## 2020-10-17 DIAGNOSIS — I6523 Occlusion and stenosis of bilateral carotid arteries: Secondary | ICD-10-CM

## 2020-10-18 ENCOUNTER — Other Ambulatory Visit: Payer: Self-pay | Admitting: Cardiology

## 2020-10-18 DIAGNOSIS — I10 Essential (primary) hypertension: Secondary | ICD-10-CM

## 2020-10-20 ENCOUNTER — Encounter: Payer: Self-pay | Admitting: Internal Medicine

## 2020-10-20 ENCOUNTER — Non-Acute Institutional Stay: Payer: Medicare PPO | Admitting: Internal Medicine

## 2020-10-20 ENCOUNTER — Other Ambulatory Visit: Payer: Self-pay

## 2020-10-20 VITALS — BP 138/64 | HR 64 | Temp 97.5°F | Ht 65.0 in | Wt 148.0 lb

## 2020-10-20 DIAGNOSIS — M545 Low back pain, unspecified: Secondary | ICD-10-CM

## 2020-10-20 DIAGNOSIS — Z66 Do not resuscitate: Secondary | ICD-10-CM | POA: Diagnosis not present

## 2020-10-20 NOTE — Patient Instructions (Signed)
Please use tylenol for your pain (generic acetaminophen) up to maximum of 3000mg  per day.  You may use heat to the affected area of your back when you do not have voltaren gel on there.  Be careful when you first stand up.  Let me know if you do not get relief with the accupuncture.  We can arrange PT here with Thamas Jaegers, et al.  See you later this month.

## 2020-10-20 NOTE — Progress Notes (Signed)
Location:  Occupational psychologist of Service:  Clinic (12)  Provider: Dayln Tugwell L. Mariea Clonts, D.O., C.M.D.  Code Status: DNR Goals of Care:  Advanced Directives 06/10/2020  Does Patient Have a Medical Advance Directive? Yes  Type of Paramedic of [place];Out of facility DNR (pink MOST or yellow form);Living will  Does patient want to make changes to medical advance directive? No - Patient declined  Copy of [place] in Chart? Yes - validated most recent copy scanned in chart (See row information)  Pre-existing out of facility DNR order (yellow form or pink MOST form) Yellow form placed in chart (order not valid for inpatient use)     Chief Complaint  Patient presents with  . Acute Visit    Complains of back pain for about 1-2 weeks. Stated that it started hurting after getting up from a chair.     HPI: Patient is a 87 y.o. female seen today for acute visit for back pain.  She was getting up out of a deep chair 1-2 wks ago.  Lower back on her right side.  Hurts if she twists, bends over, or coughs it hurts from inside.  She made an accupuncture appt.  She had shoulder problems years ago relieved with that.  Has been trying to stand up straight.  Has been putting voltaren gel on it.  Also taking something orally but not sure what.  Has not tried heat or ice.  It's better in the day, worse in bed when turns or moves.  Hurts to lie on her back so lying on side.  No radiation.    Has occasional piercing things she'd rate near 10, but normally around 5 walking around.    Past Medical History:  Diagnosis Date  . Actinic keratosis 09/23/2012  . 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 Arthralgia of temporomandibular joint 12/30/2009  . Atrial fibrillation ([place])   . Atrial tachycardia ([place])    s/p ablation 2008. [facility] (R free wall Atach)  . Atrophy of vulva 09/23/2012  . Blepharochalasis 03/01/2011  . Cardiac dysrhythmia, unspecified  11/10/2009  . Dermatophytosis of nail 02/28/2008  . Dizziness and giddiness 04/03/2007  . Dysphagia, unspecified(787.20) 01/17/2012  . Edema 11/21/2006  . External hemorrhoids without mention of complication 79/89/2119  . HTN (hypertension)   . Hyperlipidemia   . Insomnia, unspecified 11/18/2004  . Internal hemorrhoids without mention of complication 11/29/4079  . Left bundle branch hemiblock   . Loss of weight 03/05/2012  . Myalgia and myositis, unspecified 01/05/2009  . Orthostatic hypotension 03/22/2007  . Other malaise and fatigue 03/05/2012  . Pain in joint, site unspecified 02/28/2008  . Palpitations 11/21/2006  . Personality change due to conditions classified elsewhere   . Pterygium eye, right 06/07/2020  . Rectocele 09/23/2012  . Restless legs syndrome (RLS)   . Rosacea 09/23/2012  . Sebaceous cyst 03/01/2011  . Thumb pain 11/20/2012  . Unspecified hereditary and idiopathic peripheral neuropathy     Past Surgical History:  Procedure Laterality Date  . BLADDER SUSPENSION  1998  . CATARACT EXTRACTION W/ INTRAOCULAR LENS IMPLANT Right 4/8//2008   Dr. Charise Killian  . catheter ablation  2008   for atrial tachycardia  . CYST EXCISION  1998   mucus cyst removal from finger  . PTERYGIUM EXCISION  06/07/2020  . TOTAL ABDOMINAL HYSTERECTOMY  2001   uterine bleeding    Allergies  Allergen Reactions  . Erythromycin Nausea And Vomiting  . Flecainide  Acetate     Postural hypotension  . Penicillins     Outpatient Encounter Medications as of 10/20/2020  Medication Sig  . cholecalciferol (VITAMIN D) 1000 UNITS tablet Take 1,000 Units by mouth daily.  Marland Kitchen diltiazem (CARDIZEM CD) 240 MG 24 hr capsule TAKE (1) CAPSULE DAILY.  . furosemide (LASIX) 20 MG tablet TAKE 1 TABLET ONCE DAILY AS NEEDED FOR SWELLING- TAKE 1/2 TABLET FOR LEG SWELLING AS NEEDED.  Marland Kitchen Homeopathic Products (ZICAM COLD REMEDY PO) Take 1 tablet by mouth daily.   . metoprolol succinate (TOPROL-XL) 50 MG 24 hr tablet TAKE 1/2 TABLET EVERY DAY.   Marland Kitchen Misc Natural Products (TURMERIC CURCUMIN) CAPS Take 1 capsule by mouth daily.  . Multiple Vitamins-Minerals (PRESERVISION AREDS PO) Take 2 tablets by mouth daily.   Marland Kitchen olmesartan-hydrochlorothiazide (BENICAR HCT) 40-12.5 MG tablet TAKE 1 TABLET IN THE MORNING.  . rosuvastatin (CRESTOR) 20 MG tablet TAKE 1 TABLET ONCE DAILY.  Marland Kitchen XARELTO 20 MG TABS tablet TAKE 1 TABLET ONCE DAILY WITH DINNER.   No facility-administered encounter medications on file as of 10/20/2020.    Review of Systems:  Review of Systems  Constitutional: Negative for chills and fever.  Respiratory: Negative for shortness of breath.   Cardiovascular: Negative for chest pain and palpitations.  Gastrointestinal: Negative for abdominal pain and constipation.  Musculoskeletal: Positive for back pain. Negative for falls.  Neurological: Negative for dizziness and loss of consciousness.  Psychiatric/Behavioral: Negative for depression. The patient is not nervous/anxious and does not have insomnia.     Health Maintenance  Topic Date Due  . COVID-19 Vaccine (4 - Booster for Moderna series) 12/27/2020  . TETANUS/TDAP  07/14/2025  . INFLUENZA VACCINE  Completed  . DEXA SCAN  Completed  . PNA vac Low Risk Adult  Completed  . HPV VACCINES  Aged Out    Physical Exam: Vitals:   10/20/20 1026  BP: 138/64  Pulse: 64  Temp: (!) 97.5 F (36.4 C)  TempSrc: Skin  SpO2: 97%  Weight: 148 lb (67.1 kg)  Height: 5\' 5"  (1.651 m)   Body mass index is 24.63 kg/m. Physical Exam Vitals reviewed.  Constitutional:      Appearance: Normal appearance.  Cardiovascular:     Rate and Rhythm: Rhythm irregular.     Heart sounds: No murmur heard.   Pulmonary:     Effort: Pulmonary effort is normal.     Breath sounds: Normal breath sounds. No wheezing, rhonchi or rales.  Abdominal:     General: Bowel sounds are normal.     Palpations: Abdomen is soft.     Tenderness: There is no right CVA tenderness or left CVA tenderness.   Musculoskeletal:        General: Normal range of motion.     Right lower leg: Edema present.     Left lower leg: Edema present.     Comments: wearing her compression stockings; right lower back tenderness in lumbar region  Neurological:     General: No focal deficit present.     Mental Status: She is alert and oriented to person, place, and time.     Gait: Gait abnormal.     Deep Tendon Reflexes: Reflexes normal.     Comments: Using cane  Psychiatric:        Mood and Affect: Mood normal.        Behavior: Behavior normal.     Labs reviewed: Basic Metabolic Panel: Recent Labs    04/20/20 0000  NA 136*  K  3.8  CL 99  CO2 24*  BUN 19  CREATININE 0.8  CALCIUM 8.8  TSH 3.19  3.19   Liver Function Tests: Recent Labs    04/20/20 0000  AST 22  ALT 14  ALBUMIN 4.0   No results for input(s): LIPASE, AMYLASE in the last 8760 hours. No results for input(s): AMMONIA in the last 8760 hours. CBC: Recent Labs    04/20/20 0000  WBC 4.9  4.9  HGB 13.0  13.0  HCT 39  246*   Lipid Panel: No results for input(s): CHOL, HDL, LDLCALC, TRIG, CHOLHDL, LDLDIRECT in the last 8760 hours. No results found for: HGBA1C  Procedures since last visit: PCV CAROTID DUPLEX (BILATERAL)  Result Date: 10/17/2020 Carotid artery duplex 10/14/2020: Minimal stenosis in the right internal carotid artery (minimal). Stenosis in the right external carotid artery (<50%). Stenosis in the left internal carotid artery (16-49%). Stenosis in the left external carotid artery (<50%). Antegrade right vertebral artery flow. Antegrade left vertebral artery flow. Compared to 05/03/2020, left ICA stenosis of 50-69% is now 16-49%.  Upper limit of the spectrum.  Overall, no significant change. Follow up in one year is appropriate if clinically indicated.   Assessment/Plan 1.  Right sided low back pain w/o sciatica -Please use tylenol for your pain (generic acetaminophen) up to maximum of 3000mg  per day.  You may  use heat to the affected area of your back when you do not have voltaren gel on there.  Be careful when you first stand up.  Let me know if you do not get relief with the accupuncture.  We can arrange PT here with Thamas Jaegers, et al.  See you later this month.  2. DNR (do not resuscitate) - Do not attempt resuscitation (DNR) order was reentered due to expiration  Labs/tests ordered:  No new Next appt:  11/03/2020--keep scheduled routine visit  Jw Covin L. Edison Nicholson, D.O. [facility] [address], [place] [address] Cell Phone (Mon-Fri 8am-5pm):  [phone] On Call:  [phone] & follow prompts after 5pm & weekends Office Phone:  [phone] Office Fax:  [phone]

## 2020-10-20 NOTE — Progress Notes (Signed)
Mild disease in the left carotid, will discuss on office visit.

## 2020-10-26 DIAGNOSIS — H04123 Dry eye syndrome of bilateral lacrimal glands: Secondary | ICD-10-CM | POA: Diagnosis not present

## 2020-10-28 DIAGNOSIS — D6869 Other thrombophilia: Secondary | ICD-10-CM | POA: Diagnosis not present

## 2020-10-28 DIAGNOSIS — E7800 Pure hypercholesterolemia, unspecified: Secondary | ICD-10-CM | POA: Diagnosis not present

## 2020-10-28 LAB — CBC AND DIFFERENTIAL
HCT: 38 (ref 36–46)
Hemoglobin: 13.1 (ref 12.0–16.0)
Platelets: 254 (ref 150–399)
WBC: 7

## 2020-10-28 LAB — BASIC METABOLIC PANEL
BUN: 26 — AB (ref 4–21)
CO2: 23 — AB (ref 13–22)
Chloride: 104 (ref 99–108)
Creatinine: 0.7 (ref 0.5–1.1)
Glucose: 108
Potassium: 3.6 (ref 3.4–5.3)
Sodium: 140 (ref 137–147)

## 2020-10-28 LAB — COMPREHENSIVE METABOLIC PANEL: Calcium: 9.3 (ref 8.7–10.7)

## 2020-10-28 LAB — CBC: RBC: 4.06 (ref 3.87–5.11)

## 2020-10-29 ENCOUNTER — Encounter: Payer: Self-pay | Admitting: *Deleted

## 2020-11-03 ENCOUNTER — Encounter: Payer: Self-pay | Admitting: Internal Medicine

## 2020-11-08 ENCOUNTER — Encounter: Payer: Self-pay | Admitting: Adult Health

## 2020-11-08 ENCOUNTER — Non-Acute Institutional Stay: Payer: Medicare PPO | Admitting: Adult Health

## 2020-11-08 ENCOUNTER — Other Ambulatory Visit: Payer: Self-pay

## 2020-11-08 VITALS — BP 138/76 | HR 94 | Temp 97.0°F | Ht 65.0 in | Wt 145.8 lb

## 2020-11-08 DIAGNOSIS — I6522 Occlusion and stenosis of left carotid artery: Secondary | ICD-10-CM | POA: Diagnosis not present

## 2020-11-08 DIAGNOSIS — I10 Essential (primary) hypertension: Secondary | ICD-10-CM

## 2020-11-08 DIAGNOSIS — I480 Paroxysmal atrial fibrillation: Secondary | ICD-10-CM

## 2020-11-08 DIAGNOSIS — H6122 Impacted cerumen, left ear: Secondary | ICD-10-CM

## 2020-11-08 DIAGNOSIS — D6869 Other thrombophilia: Secondary | ICD-10-CM

## 2020-11-08 DIAGNOSIS — M545 Low back pain, unspecified: Secondary | ICD-10-CM

## 2020-11-08 DIAGNOSIS — I4891 Unspecified atrial fibrillation: Secondary | ICD-10-CM

## 2020-11-08 DIAGNOSIS — E7800 Pure hypercholesterolemia, unspecified: Secondary | ICD-10-CM

## 2020-11-08 DIAGNOSIS — I872 Venous insufficiency (chronic) (peripheral): Secondary | ICD-10-CM

## 2020-11-08 NOTE — Progress Notes (Signed)
Location:  Occupational psychologist of Service:  Clinic (12) Provider: Royal Hawthorn, NP  Patient Care Team: [name], MD as PCP - General (Internal Medicine) [facility], [facility] Amaryllis Dyke, [place] Donovan, NP as Nurse Practitioner (Geriatric Medicine) Adrian Prows, MD as Consulting Physician (Cardiology) Earlean Polka, MD as Consulting Physician (Ophthalmology)  Extended Emergency Contact Information Primary Emergency Contact: [name] of [place] Phone: [phone] Work Phone: [phone] Relation: Son  Code Status: DNR Goals of Care: Advanced Directive information Advanced Directives 11/08/2020  Does Patient Have a Medical Advance Directive? Yes  Type of Paramedic of [place];Out of facility DNR (pink MOST or yellow form)  Does patient want to make changes to medical advance directive? No - Patient declined  Copy of [place] in Chart? -  Pre-existing out of facility DNR order (yellow form or pink MOST form) -     Chief Complaint  Patient presents with  . Annual Exam    Annual Exam. Complains of Back pain x 1 month    HPI: Patient is a 87 y.o. female seen in today for an annual exam  Reports continued left hip pain. Some low back pain as well. She denies any radicular pain down the thigh or calf area or change in bowel or bladder habits. Denies numbness or tingling .She has been using voltaren gel and tylenol with some relief. Also tried accupuncture. The pain started about a month ago.  No injury noted.   She is not having any issues with palpitations, sob, doe or weight gain.    Wants her ears checked for cerumen impaction. Denies hearing loss or pain  See Dr. Einar Gip for mild carotid stenosis. Duplex done 10/14/20 showing mild disease.    Depression screen [facility] 2/9 06/10/2020 05/05/2020 11/26/2019 06/10/2019 04/30/2019  Decreased Interest 0 0 0 0 0  Down, Depressed,  Hopeless 0 0 0 0 0  PHQ - 2 Score 0 0 0 0 0    Fall Risk  11/08/2020 06/10/2020 05/05/2020 11/26/2019 10/29/2019  Falls in the past year? 0 0 0 1 0  Number falls in past yr: 0 0 0 0 0  Injury with Fall? 0 0 0 1 0  Comment - - - - -  Risk for fall due to : - - - - -  Follow up Falls evaluation completed - - - -   MMSE - Mini Mental State Exam 10/02/2017 10/04/2016 10/06/2015 10/06/2014  Orientation to time 5 5 4 5   Orientation to Place 5 5 5 5   Registration 3 3 3 3   Attention/ Calculation 5 5 5 5   Recall 3 3 3 3   Language- name 2 objects 2 2 2 2   Language- repeat 1 1 1 1   Language- follow 3 step command 3 3 3 3   Language- read & follow direction 1 1 1 1   Write a sentence 1 1 1 1   Copy design 1 1 1 1   Total score 30 30 29 30      Health Maintenance  Topic Date Due  . COVID-19 Vaccine (4 - Booster for Moderna series) 12/27/2020  . TETANUS/TDAP  07/14/2025  . INFLUENZA VACCINE  Completed  . DEXA SCAN  Completed  . PNA vac Low Risk Adult  Completed  . HPV VACCINES  Aged Out    Urinary incontinence? no Functional Status Survey: Is the patient deaf or have difficulty hearing?: No Does the patient have difficulty seeing, even  when wearing glasses/contacts?: Yes Does the patient have difficulty concentrating, remembering, or making decisions?: No Does the patient have difficulty walking or climbing stairs?: No Does the patient have difficulty dressing or bathing?: No Does the patient have difficulty doing errands alone such as visiting a doctor's office or shopping?: No Exercise?yes   No exam data present Hearing:  NA  Dentition:dental exam yearly Pain:noted to low back   Past Medical History:  Diagnosis Date  . Actinic keratosis 09/23/2012  . 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 Arthralgia of temporomandibular joint 12/30/2009  . Atrial fibrillation ([place])   . Atrial tachycardia ([place])    s/p ablation 2008. [facility] (R free wall Atach)  . Atrophy of vulva 09/23/2012  .  Blepharochalasis 03/01/2011  . Cardiac dysrhythmia, unspecified 11/10/2009  . Dermatophytosis of nail 02/28/2008  . Dizziness and giddiness 04/03/2007  . Dysphagia, unspecified(787.20) 01/17/2012  . Edema 11/21/2006  . External hemorrhoids without mention of complication 99/37/1696  . HTN (hypertension)   . Hyperlipidemia   . Insomnia, unspecified 11/18/2004  . Internal hemorrhoids without mention of complication 7/89/3810  . Left bundle branch hemiblock   . Loss of weight 03/05/2012  . Myalgia and myositis, unspecified 01/05/2009  . Orthostatic hypotension 03/22/2007  . Other malaise and fatigue 03/05/2012  . Pain in joint, site unspecified 02/28/2008  . Palpitations 11/21/2006  . Personality change due to conditions classified elsewhere   . Pterygium eye, right 06/07/2020  . Rectocele 09/23/2012  . Restless legs syndrome (RLS)   . Rosacea 09/23/2012  . Sebaceous cyst 03/01/2011  . Thumb pain 11/20/2012  . Unspecified hereditary and idiopathic peripheral neuropathy     Past Surgical History:  Procedure Laterality Date  . BLADDER SUSPENSION  1998  . CATARACT EXTRACTION W/ INTRAOCULAR LENS IMPLANT Right 4/8//2008   Dr. Charise Killian  . catheter ablation  2008   for atrial tachycardia  . CYST EXCISION  1998   mucus cyst removal from finger  . PTERYGIUM EXCISION  06/07/2020  . TOTAL ABDOMINAL HYSTERECTOMY  2001   uterine bleeding    Family History  Problem Relation Age of Onset  . Heart disease Mother   . Heart disease Father   . Cancer Sister        breast    Social History   Socioeconomic History  . Marital status: Widowed    Spouse name: Not on file  . Number of children: 3  . Years of education: Not on file  . Highest education level: Not on file  Occupational History  . Occupation: Retired Product manager: [employer]  Tobacco Use  . Smoking status: Never Smoker  . Smokeless tobacco: Never Used  . Tobacco comment: tobacco  - none  Vaping Use  . Vaping Use: Never used   Substance and Sexual Activity  . Alcohol use: Yes    Comment: 1-2 glasses of wine/gin daily   . Drug use: No  . Sexual activity: Never  Other Topics Concern  . 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 She has never used smokeless tobacco. She reports current alcohol use. She reports that she does not use drugs.   Allergies  Allergen Reactions  . Erythromycin Nausea And Vomiting  . Flecainide Acetate     Postural hypotension  . Penicillins     Outpatient Encounter Medications as of 11/08/2020  Medication Sig  . cholecalciferol (VITAMIN D) 1000 UNITS tablet Take 1,000 Units by mouth daily.  . cycloSPORINE (RESTASIS) 0.05 % ophthalmic emulsion Place 1 drop into both eyes 2 (two) times daily.  Marland Kitchen diltiazem (CARDIZEM CD) 240 MG 24 hr capsule TAKE (1) CAPSULE DAILY.  . furosemide (LASIX) 20 MG tablet TAKE 1 TABLET ONCE DAILY AS NEEDED FOR SWELLING- TAKE 1/2 TABLET FOR LEG SWELLING AS NEEDED.  Marland Kitchen Homeopathic Products (ZICAM COLD REMEDY PO) Take 1 tablet by mouth daily.   . metoprolol succinate (TOPROL-XL) 50 MG 24 hr tablet TAKE 1/2 TABLET EVERY DAY.  Marland Kitchen Misc Natural Products (TURMERIC CURCUMIN) CAPS Take 1 capsule by mouth daily.  . Multiple Vitamins-Minerals (PRESERVISION AREDS PO) Take 2 tablets by mouth daily.   Marland Kitchen olmesartan-hydrochlorothiazide (BENICAR HCT) 40-12.5 MG tablet TAKE 1 TABLET IN THE MORNING.  . rosuvastatin (CRESTOR) 20 MG tablet TAKE 1 TABLET ONCE DAILY.  Marland Kitchen XARELTO 20 MG TABS tablet TAKE 1 TABLET ONCE DAILY WITH DINNER.    No facility-administered encounter medications on file as of 11/08/2020.     Review of Systems:  Review of Systems  Constitutional: Negative for activity change, appetite change, chills, diaphoresis, fatigue, fever and unexpected weight change.  HENT: Negative for congestion.   Respiratory: Negative for cough, shortness of breath and wheezing.   Cardiovascular: Negative for chest pain, palpitations and leg swelling.  Gastrointestinal: Negative for abdominal distention, abdominal pain, constipation and diarrhea.  Genitourinary: Negative for difficulty urinating and dysuria.  Musculoskeletal: Positive for back pain. Negative for arthralgias, gait problem, joint swelling and myalgias.  Neurological: Negative for dizziness, tremors, seizures, syncope, facial asymmetry, speech difficulty, weakness, light-headedness, numbness and headaches.  Psychiatric/Behavioral: Negative for agitation, behavioral problems and confusion.    Physical Exam: Vitals:   11/08/20 1301  BP: 138/76  Pulse: 94  Temp: (!) 97 F (36.1 C)  TempSrc: Oral  SpO2: 98%  Weight: 145 lb 12.8 oz (66.1 kg)  Height: 5\' 5"  (1.651 m)   Body mass index is 24.26 kg/m. Physical Exam Vitals and nursing note reviewed.  Constitutional:      General: She is not in acute distress.    Appearance: She is not diaphoretic.  HENT:     Head: Normocephalic and atraumatic.     Right Ear: Tympanic membrane, ear canal and external ear normal.     Left Ear: Ear canal and external ear normal. There is impacted cerumen.     Nose: Nose normal. No congestion.     Mouth/Throat:     Mouth: Mucous membranes are moist.     Pharynx: Oropharynx is clear. No oropharyngeal exudate.  Eyes:     Conjunctiva/sclera: Conjunctivae normal.     Pupils: Pupils are equal, round, and reactive to light.  Neck:     Vascular: No carotid bruit or JVD.  Cardiovascular:     Rate and Rhythm: Normal rate and regular rhythm.     Heart sounds: No murmur  heard.   Pulmonary:     Effort: Pulmonary effort is normal. No respiratory distress.     Breath sounds:  Normal breath sounds. No wheezing.  Chest:  Breasts: Breasts are symmetrical.     Right: Normal.     Left: Normal.    Abdominal:     General: Abdomen is flat. Bowel sounds are normal. There is no distension.     Palpations: Abdomen is soft.     Tenderness: There is no abdominal tenderness.  Musculoskeletal:        General: No swelling, tenderness, deformity or signs of injury.     Cervical back: No rigidity or tenderness.     Right lower leg: No edema.     Left lower leg: No edema.     Comments: Neg SLR bilaterally. Strength BUE 5/5 BLE 5/5.    Lymphadenopathy:     Cervical: No cervical adenopathy.  Skin:    General: Skin is warm and dry.  Neurological:     Mental Status: She is alert and oriented to person, place, and time.     Labs reviewed: Basic Metabolic Panel: Recent Labs    04/20/20 0000 10/28/20 0000  NA 136* 140  K 3.8 3.6  CL 99 104  CO2 24* 23*  BUN 19 26*  CREATININE 0.8 0.7  CALCIUM 8.8 9.3  TSH 3.19  3.19  --    Liver Function Tests: Recent Labs    04/20/20 0000  AST 22  ALT 14  ALBUMIN 4.0   No results for input(s): LIPASE, AMYLASE in the last 8760 hours. No results for input(s): AMMONIA in the last 8760 hours. CBC: Recent Labs    04/20/20 0000 10/28/20 0000  WBC 4.9  4.9 7.0  HGB 13.0  13.0 13.1  HCT 39  246* 38  PLT  --  254   Lipid Panel: No results for input(s): CHOL, HDL, LDLCALC, TRIG, CHOLHDL, LDLDIRECT in the last 8760 hours. No results found for: HGBA1C  Procedures: PCV CAROTID DUPLEX (BILATERAL)  Result Date: 10/17/2020 Carotid artery duplex 10/14/2020: Minimal stenosis in the right internal carotid artery (minimal). Stenosis in the right external carotid artery (<50%). Stenosis in the left internal carotid artery (16-49%). Stenosis in the left external carotid artery (<50%). Antegrade right vertebral artery flow.  Antegrade left vertebral artery flow. Compared to 05/03/2020, left ICA stenosis of 50-69% is now 16-49%.  Upper limit of the spectrum.  Overall, no significant change. Follow up in one year is appropriate if clinically indicated.   Assessment/Plan  1. Acute left-sided low back pain without sciatica Try Tylenol 1000 mg tid for pain prn  Continue voltaren gel  - DG Lumbar Spine Complete; Future - DG Hip Unilat W OR W/O Pelvis Min 4 Views Left; Future Consider PT eval   2. Impacted cerumen of left ear [place] nurse to perform lavage  3. Paroxysmal atrial fibrillation (HCC) Rate is controlled Cardizem 240 mg qd   4. Hypercoagulable state due to atrial fibrillation (HCC) Continue Xarelto 20 mg qd for CVA risk reduction  5. Essential hypertension Controlled  Continue Benicar   6. Pure hypercholesterolemia Lab Results  Component Value Date   LDLCALC 87 04/22/2019   Continue Crestor 20 mg qd   7. Venous insufficiency of both lower extremities Not currently an issue  8. Stenosis of left carotid artery Mild Followed by Dr. Einar Gip   Labs/tests ordered:  Xray of lumbar spine and left hip  Next appt:  F/U 6 months with Dr. Lyndel Safe

## 2020-11-11 ENCOUNTER — Other Ambulatory Visit: Payer: Self-pay

## 2020-11-11 ENCOUNTER — Ambulatory Visit: Payer: Medicare PPO | Admitting: Cardiology

## 2020-11-11 ENCOUNTER — Encounter: Payer: Self-pay | Admitting: Cardiology

## 2020-11-11 VITALS — BP 153/78 | HR 85 | Temp 98.2°F | Resp 17 | Ht 65.0 in | Wt 146.2 lb

## 2020-11-11 DIAGNOSIS — I6523 Occlusion and stenosis of bilateral carotid arteries: Secondary | ICD-10-CM

## 2020-11-11 DIAGNOSIS — I10 Essential (primary) hypertension: Secondary | ICD-10-CM

## 2020-11-11 DIAGNOSIS — E7800 Pure hypercholesterolemia, unspecified: Secondary | ICD-10-CM

## 2020-11-11 DIAGNOSIS — I480 Paroxysmal atrial fibrillation: Secondary | ICD-10-CM | POA: Diagnosis not present

## 2020-11-11 NOTE — Progress Notes (Signed)
Primary Physician/Referring:  [name], MD  Patient ID: Rebekah Graham, female    DOB: 07/24/1929, 87 y.o.   MRN: [id number]  Chief Complaint  Patient presents with  . Follow-up    6 month  . carotid stenosis  . Hyperlipidemia  . Atrial Fibrillation   HPI:     Rebekah Graham  is an 87 y.o. Caucasian female with history of paroxysmal atrial fibrillation, hypertension and hyperlipidemia.  She presents here for 28-month office visit, doing well and has back pain that started recently and thinks she has sprained it.  She is getting a x-ray for the same.  She has had one episode of palpitation a week ago at night that lasted a few minutes and subsided spontaneously after she had a breath.  No further episodes.  She denies any dizziness or syncope.  Tolerating all her medications well.  No bleeding diathesis on Xarelto.   Past Medical History:  Diagnosis Date  . Actinic keratosis 09/23/2012  . 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 Arthralgia of temporomandibular joint 12/30/2009  . Atrial fibrillation ([place])   . Atrial tachycardia ([place])    s/p ablation 2008. [facility] (R free wall Atach)  . Atrophy of vulva 09/23/2012  . Blepharochalasis 03/01/2011  . Cardiac dysrhythmia, unspecified 11/10/2009  . Dermatophytosis of nail 02/28/2008  . Dizziness and giddiness 04/03/2007  . Dysphagia, unspecified(787.20) 01/17/2012  . Edema 11/21/2006  . External hemorrhoids without mention of complication 40/98/1191  . HTN (hypertension)   . Hyperlipidemia   . Insomnia, unspecified 11/18/2004  . Internal hemorrhoids without mention of complication 4/78/2956  . Left bundle branch hemiblock   . Loss of weight 03/05/2012  . Myalgia and myositis, unspecified 01/05/2009  . Orthostatic hypotension 03/22/2007  . Other malaise and fatigue 03/05/2012  . Pain in joint, site unspecified 02/28/2008  . Palpitations 11/21/2006  . Personality change due to conditions classified elsewhere   . Pterygium eye, right 06/07/2020  .  Rectocele 09/23/2012  . Restless legs syndrome (RLS)   . Rosacea 09/23/2012  . Sebaceous cyst 03/01/2011  . Thumb pain 11/20/2012  . Unspecified hereditary and idiopathic peripheral neuropathy    Past Surgical History:  Procedure Laterality Date  . BLADDER SUSPENSION  1998  . CATARACT EXTRACTION W/ INTRAOCULAR LENS IMPLANT Right 4/8//2008   Dr. Charise Killian  . catheter ablation  2008   for atrial tachycardia  . CYST EXCISION  1998   mucus cyst removal from finger  . PTERYGIUM EXCISION  06/07/2020  . TOTAL ABDOMINAL HYSTERECTOMY  2001   uterine bleeding   Social History   Tobacco Use  . Smoking status: Never Smoker  . Smokeless tobacco: Never Used  . Tobacco comment: tobacco  - none  Substance Use Topics  . Alcohol use: Yes    Comment: 1-2 glasses of wine/gin daily     Family History  Problem Relation Age of Onset  . Heart disease Mother   . Heart disease Father   . Cancer Sister        breast    ROS  Review of Systems  Cardiovascular: Positive for palpitations. Negative for chest pain, dyspnea on exertion, leg swelling and near-syncope.  Skin:       Easy bruising  Musculoskeletal: Positive for back pain and joint pain.  Gastrointestinal: Negative for melena.   Objective  Blood pressure (!) 153/78, pulse 85, temperature 98.2 F (36.8 C), temperature source Temporal, resp. rate 17, height 5\' 5"  (1.651 m), weight 146 lb 3.2  oz (66.3 kg), SpO2 95 %. Body mass index is 24.33 kg/m.    Physical Exam Constitutional:      General: She is not in acute distress.    Appearance: She is well-developed.  Eyes:     Conjunctiva/sclera: Conjunctivae normal.  Cardiovascular:     Rate and Rhythm: Normal rate and regular rhythm.     Pulses: Intact distal pulses.     Heart sounds: Normal heart sounds. No murmur Rebekah. No gallop.      Comments: 2+ ankle edema present. No JVD. Pulmonary:     Effort: Pulmonary effort is normal.     Breath sounds: Normal breath sounds.  Abdominal:      General: Bowel sounds are normal.     Palpations: Abdomen is soft.    Radiology: No results found.  Laboratory examination:   CMP Latest Ref Rng & Units 10/28/2020 04/20/2020 04/22/2019  BUN 4 - 21 26(A) 19 21  Creatinine 0.5 - 1.1 0.7 0.8 0.8  Sodium 137 - 147 140 136(A) 141  Potassium 3.4 - 5.3 3.6 3.8 3.8  Chloride 99 - 108 104 99 -  CO2 13 - 22 23(A) 24(A) -  Calcium 8.7 - 10.7 9.3 8.8 -  Alkaline Phos 25 - 125 - - 64  AST 13 - 35 - 22 25  ALT 7 - 35 - 14 16   CBC Latest Ref Rng & Units 10/28/2020 04/20/2020 04/20/2020  WBC - 7.0 4.9 4.9  Hemoglobin 12.0 - 16.0 13.1 13.0 13.0  Hematocrit 36 - 46 38 39 246(A)  Platelets 150 - 399 254 - -   Lipid Panel     Component Value Date/Time   CHOL 160 04/22/2019 0000   TRIG 73 04/22/2019 0000   HDL 59 04/22/2019 0000   LDLCALC 87 04/22/2019 0000   HEMOGLOBIN A1C No results found for: HGBA1C, MPG TSH Recent Labs    04/20/20 0000  TSH 3.19  3.19    Medications   Outpatient Medications Prior to Visit  Medication Sig Dispense Refill  . acetaminophen (TYLENOL) 500 MG tablet Take 1,000 mg by mouth 3 (three) times daily as needed.    . cholecalciferol (VITAMIN D) 1000 UNITS tablet Take 1,000 Units by mouth daily.    . cycloSPORINE (RESTASIS) 0.05 % ophthalmic emulsion Place 1 drop into both eyes 2 (two) times daily.    Marland Kitchen diltiazem (CARDIZEM CD) 240 MG 24 hr capsule TAKE (1) CAPSULE DAILY. 90 capsule 0  . furosemide (LASIX) 20 MG tablet TAKE 1 TABLET ONCE DAILY AS NEEDED FOR SWELLING- TAKE 1/2 TABLET FOR LEG SWELLING AS NEEDED. 30 tablet 0  . Homeopathic Products (ZICAM COLD REMEDY PO) Take 1 tablet by mouth as needed.    . metoprolol succinate (TOPROL-XL) 50 MG 24 hr tablet TAKE 1/2 TABLET EVERY DAY. 45 tablet 0  . Multiple Vitamins-Minerals (PRESERVISION AREDS PO) Take 2 tablets by mouth daily.     Marland Kitchen olmesartan-hydrochlorothiazide (BENICAR HCT) 40-12.5 MG tablet TAKE 1 TABLET IN THE MORNING. 30 tablet 0  . rosuvastatin (CRESTOR) 20  MG tablet TAKE 1 TABLET ONCE DAILY. 30 tablet 0  . XARELTO 20 MG TABS tablet TAKE 1 TABLET ONCE DAILY WITH DINNER. 90 tablet 0   No facility-administered medications prior to visit.     Cardiac Studies:   Echo- 07/13/2015 1. Left ventricle cavity is normal in size. Mild concentric hypertrophy of the left ventricle. Normal global wall motion. Calculated EF 66%. 2. Left atrial cavity is mildly dilated. 3. Trace  aortic regurgitation. 4. Mild mitral regurgitation. 5. Mild tricuspid regurgitation. No evidence of pulmonary hypertension.  Carotid artery duplex 10/14/2020: Minimal stenosis in the right internal carotid artery (minimal). Stenosis in the right external carotid artery (<50%). Stenosis in the left internal carotid artery (16-49%). Stenosis in the left external carotid artery (<50%). Antegrade right vertebral artery flow. Antegrade left vertebral artery flow. Compared to 05/03/2020, left ICA stenosis of 50-69% is now 16-49%.  Upper limit of the spectrum.  Overall, no significant change. Follow up in one year is appropriate if clinically indicated.  EKG:    EKG 11/11/2020: Normal sinus rhythm at rate of 69 bpm, left atrial enlargement, normal axis.  Incomplete right bundle branch block.  Poor R wave progression, cannot exclude anteroseptal infarct old.  PAC (2).   No significant change from EKG 04/05/2020  EKG 09/02/18/20: Probably ectopic atrial rhythm at the rate of 99 bpm with borderline first-degree AV block.  Normal axis.  Nonspecific T abnormality.   Assessment     ICD-10-CM   1. Essential hypertension  I10 EKG 12-Lead  2. Asymptomatic bilateral carotid artery stenosis  I65.23   3. Paroxysmal atrial fibrillation (HCC)  I48.0   4. Hypercholesteremia  E78.00     There are no discontinued medications.  No orders of the defined types were placed in this encounter.  Recommendations:    Rebekah Graham  is a 87 y.o. Caucasian female with history of paroxysmal atrial  fibrillation, hypertension and hyperlipidemia.  71-month office visit, she has had one episode of palpitation that occurred at night that lasted a few minutes but no recurrence.  Today she is having back pain and appears that she may have strained it.  She is getting an x-ray for the same.  No bleeding diathesis on Xarelto.  Blood pressure is elevated today due to back pain, mostly the blood pressure has been very well controlled.  I reviewed her external labs, CBC, CMP are normal.  Lipids are not been checked in 2 years however she is presently 87 years of age and lipids previously are very well controlled and she is also on a fairly good dose of Crestor 20 mg daily.  She is not having any side effect from the same.  Hence continue the same.  No changes in the medications were done, if she has frequent episodes of palpitations that are brief, we could certainly try short acting diltiazem 30 mg on a as needed basis.  I will see her back in 6 months.  With regard to carotid artery duplex, she has very mild disease.  In view of her advanced age, no further evaluation is indicated.  Overall over the past few years carotid duplex has remained stable.   Rebekah Prows, MD, [facility] 11/11/2020, 2:43 PM Office: [phone]

## 2020-11-12 DIAGNOSIS — L814 Other melanin hyperpigmentation: Secondary | ICD-10-CM | POA: Diagnosis not present

## 2020-11-12 DIAGNOSIS — D1801 Hemangioma of skin and subcutaneous tissue: Secondary | ICD-10-CM | POA: Diagnosis not present

## 2020-11-12 DIAGNOSIS — Z85828 Personal history of other malignant neoplasm of skin: Secondary | ICD-10-CM | POA: Diagnosis not present

## 2020-11-12 DIAGNOSIS — D225 Melanocytic nevi of trunk: Secondary | ICD-10-CM | POA: Diagnosis not present

## 2020-11-12 DIAGNOSIS — L821 Other seborrheic keratosis: Secondary | ICD-10-CM | POA: Diagnosis not present

## 2020-11-12 DIAGNOSIS — D485 Neoplasm of uncertain behavior of skin: Secondary | ICD-10-CM | POA: Diagnosis not present

## 2020-11-12 DIAGNOSIS — L570 Actinic keratosis: Secondary | ICD-10-CM | POA: Diagnosis not present

## 2020-11-15 ENCOUNTER — Other Ambulatory Visit: Payer: Self-pay | Admitting: Cardiology

## 2020-11-15 DIAGNOSIS — I10 Essential (primary) hypertension: Secondary | ICD-10-CM

## 2020-11-16 ENCOUNTER — Other Ambulatory Visit: Payer: Self-pay | Admitting: Adult Health

## 2020-11-16 ENCOUNTER — Ambulatory Visit
Admission: RE | Admit: 2020-11-16 | Discharge: 2020-11-16 | Disposition: A | Discharge status: Home / Self Care | Payer: Medicare PPO | Source: Ambulatory Visit | Attending: Adult Health | Admitting: Adult Health

## 2020-11-16 DIAGNOSIS — M25752 Osteophyte, left hip: Secondary | ICD-10-CM | POA: Diagnosis not present

## 2020-11-16 DIAGNOSIS — M545 Low back pain, unspecified: Secondary | ICD-10-CM | POA: Diagnosis not present

## 2020-11-16 DIAGNOSIS — M76892 Other specified enthesopathies of left lower limb, excluding foot: Secondary | ICD-10-CM | POA: Diagnosis not present

## 2020-11-17 ENCOUNTER — Other Ambulatory Visit: Payer: Self-pay | Admitting: Cardiology

## 2020-11-18 ENCOUNTER — Telehealth: Payer: Self-pay | Admitting: Adult Health

## 2020-11-18 DIAGNOSIS — M545 Low back pain, unspecified: Secondary | ICD-10-CM

## 2020-11-18 MED ORDER — PREDNISONE 20 MG PO TABS: 20.0000 mg | ORAL_TABLET | Freq: Two times a day (BID) | ORAL | 0 refills | Status: DC

## 2020-11-18 NOTE — Telephone Encounter (Signed)
Discussed findings of lumbar xray showing degenerative changes and anterolisthesis. She is not having any radicular pain down her leg, change in bowel or bladder habits, or numbness and tingling. She continues to have pain that is not getting better and not relieved with tylenol for the past month. Will try prednisone and place PT referral for low back pain. She was instructed to report back if not improving or worsening. She also has osteopenia on dexa scan and a 20% loss of height at the T12 vertebrae. She is not having pain at this area and this is an incidental finding. She is going to f/u with Dr. Lyndel Safe in May and can discuss preventative treatment further if indicated.

## 2020-11-23 DIAGNOSIS — M5459 Other low back pain: Secondary | ICD-10-CM | POA: Diagnosis not present

## 2020-11-23 DIAGNOSIS — M25552 Pain in left hip: Secondary | ICD-10-CM | POA: Diagnosis not present

## 2020-11-23 DIAGNOSIS — M5136 Other intervertebral disc degeneration, lumbar region: Secondary | ICD-10-CM | POA: Diagnosis not present

## 2020-11-25 ENCOUNTER — Other Ambulatory Visit: Payer: Self-pay | Admitting: Orthopedic Surgery

## 2020-11-25 ENCOUNTER — Telehealth: Payer: Self-pay

## 2020-11-25 DIAGNOSIS — M5459 Other low back pain: Secondary | ICD-10-CM | POA: Diagnosis not present

## 2020-11-25 DIAGNOSIS — M25552 Pain in left hip: Secondary | ICD-10-CM

## 2020-11-25 DIAGNOSIS — M5136 Other intervertebral disc degeneration, lumbar region: Secondary | ICD-10-CM | POA: Diagnosis not present

## 2020-11-25 NOTE — Telephone Encounter (Signed)
Below is Rebekah Graham's reply sent via a routing comment verse in documentation.     I discussed options with [place]. Referral made to ortho.

## 2020-11-25 NOTE — Progress Notes (Signed)
Referral made to ortho. Patient had tried PT without success.

## 2020-11-25 NOTE — Telephone Encounter (Signed)
Incoming call received form patient stating she is having ongoing hip and back pain, which should be documented in her record, and thinks it is time to see an orthopedic specialist. Patient is willing to see ortho on site at [facility] or outpatient at an office.  Patient would like a return call once request is complete

## 2020-11-30 DIAGNOSIS — M545 Low back pain, unspecified: Secondary | ICD-10-CM | POA: Diagnosis not present

## 2020-12-02 DIAGNOSIS — M5459 Other low back pain: Secondary | ICD-10-CM | POA: Diagnosis not present

## 2020-12-02 DIAGNOSIS — M5136 Other intervertebral disc degeneration, lumbar region: Secondary | ICD-10-CM | POA: Diagnosis not present

## 2020-12-02 DIAGNOSIS — M25552 Pain in left hip: Secondary | ICD-10-CM | POA: Diagnosis not present

## 2020-12-06 DIAGNOSIS — M5136 Other intervertebral disc degeneration, lumbar region: Secondary | ICD-10-CM | POA: Diagnosis not present

## 2020-12-06 DIAGNOSIS — M5459 Other low back pain: Secondary | ICD-10-CM | POA: Diagnosis not present

## 2020-12-06 DIAGNOSIS — M25552 Pain in left hip: Secondary | ICD-10-CM | POA: Diagnosis not present

## 2020-12-08 ENCOUNTER — Ambulatory Visit: Payer: Medicare PPO | Admitting: Orthopaedic Surgery

## 2020-12-08 DIAGNOSIS — M5136 Other intervertebral disc degeneration, lumbar region: Secondary | ICD-10-CM | POA: Diagnosis not present

## 2020-12-08 DIAGNOSIS — M25552 Pain in left hip: Secondary | ICD-10-CM | POA: Diagnosis not present

## 2020-12-08 DIAGNOSIS — M5459 Other low back pain: Secondary | ICD-10-CM | POA: Diagnosis not present

## 2020-12-21 ENCOUNTER — Other Ambulatory Visit: Payer: Self-pay | Admitting: Cardiology

## 2020-12-21 DIAGNOSIS — I6523 Occlusion and stenosis of bilateral carotid arteries: Secondary | ICD-10-CM

## 2020-12-21 DIAGNOSIS — E7800 Pure hypercholesterolemia, unspecified: Secondary | ICD-10-CM

## 2021-01-24 ENCOUNTER — Other Ambulatory Visit: Payer: Self-pay | Admitting: Cardiology

## 2021-01-24 DIAGNOSIS — R600 Localized edema: Secondary | ICD-10-CM

## 2021-01-24 DIAGNOSIS — I6523 Occlusion and stenosis of bilateral carotid arteries: Secondary | ICD-10-CM

## 2021-01-24 DIAGNOSIS — E7800 Pure hypercholesterolemia, unspecified: Secondary | ICD-10-CM

## 2021-02-15 ENCOUNTER — Other Ambulatory Visit: Payer: Self-pay | Admitting: Cardiology

## 2021-03-31 ENCOUNTER — Other Ambulatory Visit: Payer: Self-pay | Admitting: Cardiology

## 2021-03-31 DIAGNOSIS — R600 Localized edema: Secondary | ICD-10-CM

## 2021-04-19 ENCOUNTER — Other Ambulatory Visit: Payer: Self-pay | Admitting: Cardiology

## 2021-04-19 DIAGNOSIS — E7800 Pure hypercholesterolemia, unspecified: Secondary | ICD-10-CM

## 2021-04-19 DIAGNOSIS — I6523 Occlusion and stenosis of bilateral carotid arteries: Secondary | ICD-10-CM

## 2021-05-13 ENCOUNTER — Ambulatory Visit: Payer: Medicare PPO | Admitting: Cardiology

## 2021-05-18 ENCOUNTER — Other Ambulatory Visit: Payer: Self-pay

## 2021-05-18 ENCOUNTER — Non-Acute Institutional Stay: Payer: Medicare PPO | Admitting: Internal Medicine

## 2021-05-18 ENCOUNTER — Encounter: Payer: Self-pay | Admitting: Internal Medicine

## 2021-05-18 VITALS — BP 168/110 | HR 111 | Temp 96.7°F | Ht 65.0 in | Wt 142.0 lb

## 2021-05-18 DIAGNOSIS — E7800 Pure hypercholesterolemia, unspecified: Secondary | ICD-10-CM

## 2021-05-18 DIAGNOSIS — I480 Paroxysmal atrial fibrillation: Secondary | ICD-10-CM

## 2021-05-18 DIAGNOSIS — I10 Essential (primary) hypertension: Secondary | ICD-10-CM | POA: Diagnosis not present

## 2021-05-18 DIAGNOSIS — G8929 Other chronic pain: Secondary | ICD-10-CM

## 2021-05-18 DIAGNOSIS — M545 Low back pain, unspecified: Secondary | ICD-10-CM

## 2021-05-18 DIAGNOSIS — I872 Venous insufficiency (chronic) (peripheral): Secondary | ICD-10-CM

## 2021-05-18 DIAGNOSIS — I6522 Occlusion and stenosis of left carotid artery: Secondary | ICD-10-CM

## 2021-05-18 DIAGNOSIS — U071 COVID-19: Secondary | ICD-10-CM

## 2021-05-18 NOTE — Patient Instructions (Signed)
Calcium Carbonate 600 mg start with 3/week Take only Tylenol for back pain Avoid Ibuprofen Get your Covid Booster in Nov Can take Robitussin as needed for cough. Let us know if it is not better in 3 weeks

## 2021-05-19 ENCOUNTER — Other Ambulatory Visit: Payer: Self-pay

## 2021-05-19 ENCOUNTER — Encounter: Payer: Self-pay | Admitting: Cardiology

## 2021-05-19 ENCOUNTER — Ambulatory Visit: Payer: Medicare PPO | Admitting: Cardiology

## 2021-05-19 VITALS — BP 128/75 | HR 77 | Temp 98.3°F | Resp 17 | Ht 65.0 in | Wt 142.0 lb

## 2021-05-19 DIAGNOSIS — I480 Paroxysmal atrial fibrillation: Secondary | ICD-10-CM

## 2021-05-19 DIAGNOSIS — I10 Essential (primary) hypertension: Secondary | ICD-10-CM

## 2021-05-19 DIAGNOSIS — E7800 Pure hypercholesterolemia, unspecified: Secondary | ICD-10-CM

## 2021-05-19 MED ORDER — HYDRALAZINE HCL 25 MG PO TABS: 25.0000 mg | ORAL_TABLET | Freq: Three times a day (TID) | ORAL | 3 refills | Status: DC

## 2021-05-19 NOTE — Progress Notes (Signed)
Primary Physician/Referring:  [name], MD  Patient ID: Rebekah Graham, female    DOB: 24-Jan-1929, 87 y.o.   MRN: [id number]  Chief Complaint  Patient presents with   Atrial Fibrillation   Hypertension   HPI:     Rebekah Graham  is a 87 y.o. Caucasian female with history of paroxysmal atrial fibrillation, hypertension and hyperlipidemia.  This is 32-month office visit, she has not had any further palpitations.  She did have COVID last month but recuperated well.  She denies any dizziness or syncope.  Tolerating all her medications well.  No bleeding diathesis on Xarelto.   Past Medical History:  Diagnosis Date   Actinic keratosis 09/23/2012   Anal fissure and fistula 07/28/2008   Arthralgia of temporomandibular joint 12/30/2009   Atrial fibrillation (HCC)    Atrial tachycardia ([place])    s/p ablation 2008. [facility] (R free wall Atach)   Atrophy of vulva 09/23/2012   Blepharochalasis 03/01/2011   Cardiac dysrhythmia, unspecified 11/10/2009   Dermatophytosis of nail 02/28/2008   Dizziness and giddiness 04/03/2007   Dysphagia, unspecified(787.20) 01/17/2012   Edema 11/21/2006   External hemorrhoids without mention of complication 08/65/7846   HTN (hypertension)    Hyperlipidemia    Insomnia, unspecified 11/18/2004   Internal hemorrhoids without mention of complication 9/62/9528   Left bundle branch hemiblock    Loss of weight 03/05/2012   Myalgia and myositis, unspecified 01/05/2009   Orthostatic hypotension 03/22/2007   Other malaise and fatigue 03/05/2012   Pain in joint, site unspecified 02/28/2008   Palpitations 11/21/2006   Personality change due to conditions classified elsewhere    Pterygium eye, right 06/07/2020   Rectocele 09/23/2012   Restless legs syndrome (RLS)    Rosacea 09/23/2012   Sebaceous cyst 03/01/2011   Thumb pain 11/20/2012   Unspecified hereditary and idiopathic peripheral neuropathy    Past Surgical History:  Procedure Laterality Date   BLADDER SUSPENSION  1998    CATARACT EXTRACTION W/ INTRAOCULAR LENS IMPLANT Right 4/8//2008   Dr. Charise Killian   catheter ablation  2008   for atrial tachycardia   CYST EXCISION  1998   mucus cyst removal from finger   PTERYGIUM EXCISION  06/07/2020   TOTAL ABDOMINAL HYSTERECTOMY  2001   uterine bleeding   Social History   Tobacco Use   Smoking status: Never   Smokeless tobacco: Never   Tobacco comments:    tobacco  - none  Substance Use Topics   Alcohol use: Yes    Comment: 1-2 glasses of wine/gin daily     Family History  Problem Relation Age of Onset   Heart disease Mother    Heart disease Father    Cancer Sister        breast    ROS  Review of Systems  Cardiovascular:  Negative for chest pain, dyspnea on exertion, leg swelling and palpitations.  Skin:        Easy bruising  Musculoskeletal:  Positive for back pain and joint pain.  Gastrointestinal:  Negative for melena.  Objective  Blood pressure 128/75, pulse 77, temperature 98.3 F (36.8 C), temperature source Temporal, resp. rate 17, height 5\' 5"  (1.651 m), weight 142 lb (64.4 kg), SpO2 95 %. Body mass index is 23.63 kg/m.    Physical Exam Constitutional:      General: She is not in acute distress.    Appearance: She is well-developed.  Eyes:     Conjunctiva/sclera: Conjunctivae normal.  Cardiovascular:     Rate  and Rhythm: Normal rate and regular rhythm.     Pulses: Intact distal pulses.     Heart sounds: Normal heart sounds. No murmur heard.   No gallop.     Comments: 2+ ankle edema present. No JVD. Pulmonary:     Effort: Pulmonary effort is normal.     Breath sounds: Normal breath sounds.  Abdominal:     General: Bowel sounds are normal.     Palpations: Abdomen is soft.   Radiology: No results found.  Laboratory examination:   CMP Latest Ref Rng & Units 10/28/2020 04/20/2020 04/22/2019  BUN 4 - 21 26(A) 19 21  Creatinine 0.5 - 1.1 0.7 0.8 0.8  Sodium 137 - 147 140 136(A) 141  Potassium 3.4 - 5.3 3.6 3.8 3.8  Chloride 99 - 108  104 99 -  CO2 13 - 22 23(A) 24(A) -  Calcium 8.7 - 10.7 9.3 8.8 -  Alkaline Phos 25 - 125 - - 64  AST 13 - 35 - 22 25  ALT 7 - 35 - 14 16   CBC Latest Ref Rng & Units 10/28/2020 04/20/2020 04/20/2020  WBC - 7.0 4.9 4.9  Hemoglobin 12.0 - 16.0 13.1 13.0 13.0  Hematocrit 36 - 46 38 39 246(A)  Platelets 150 - 399 254 - -   Lipid Panel     Component Value Date/Time   CHOL 160 04/22/2019 0000   TRIG 73 04/22/2019 0000   HDL 59 04/22/2019 0000   LDLCALC 87 04/22/2019 0000   HEMOGLOBIN A1C No results found for: HGBA1C, MPG TSH No results for input(s): TSH in the last 8760 hours.   Medications   Outpatient Medications Prior to Visit  Medication Sig Dispense Refill   acetaminophen (TYLENOL) 500 MG tablet Take 1,000 mg by mouth 3 (three) times daily as needed.     cholecalciferol (VITAMIN D) 1000 UNITS tablet Take 1,000 Units by mouth daily.     cycloSPORINE (RESTASIS) 0.05 % ophthalmic emulsion Place 1 drop into both eyes 2 (two) times daily.     diltiazem (CARDIZEM CD) 240 MG 24 hr capsule TAKE ONE CAPSULE BY MOUTH DAILY 90 capsule 0   furosemide (LASIX) 20 MG tablet TAKE 1 TABLET ONCE DAILY AS NEEDED FOR SWELLING- TAKE 1/2 TABLET FOR LEG SWELLING AS NEEDED. 60 tablet 0   Homeopathic Products (ZICAM COLD REMEDY PO) Take 1 tablet by mouth as needed.     metoprolol succinate (TOPROL-XL) 50 MG 24 hr tablet TAKE 1/2 TABLET EVERY DAY. 45 tablet 3   Multiple Vitamins-Minerals (PRESERVISION AREDS PO) Take 2 tablets by mouth daily.      olmesartan-hydrochlorothiazide (BENICAR HCT) 40-12.5 MG tablet TAKE ONE TABLET BY MOUTH IN THE MORNING 30 tablet 6   predniSONE (DELTASONE) 20 MG tablet Take 1 tablet (20 mg total) by mouth 2 (two) times daily with a meal. 10 tablet 0   rosuvastatin (CRESTOR) 20 MG tablet TAKE ONE TABLET BY MOUTH ONCE DAILY 90 tablet 0   XARELTO 20 MG TABS tablet TAKE 1 TABLET ONCE DAILY WITH DINNER. 90 tablet 1   No facility-administered medications prior to visit.      Cardiac Studies:   Echo- 07/13/2015 1. Left ventricle cavity is normal in size. Mild concentric hypertrophy of the left ventricle. Normal global wall motion. Calculated EF 66%. 2. Left atrial cavity is mildly dilated. 3. Trace aortic regurgitation. 4. Mild mitral regurgitation. 5. Mild tricuspid regurgitation. No evidence of pulmonary hypertension.  Carotid artery duplex 10/14/2020: Minimal stenosis in the right  internal carotid artery (minimal). Stenosis in the right external carotid artery (<50%). Stenosis in the left internal carotid artery (16-49%). Stenosis in the left external carotid artery (<50%). Antegrade right vertebral artery flow. Antegrade left vertebral artery flow. Compared to 05/03/2020, left ICA stenosis of 50-69% is now 16-49%.  Upper limit of the spectrum.  Overall, no significant change. Follow up in one year is appropriate if clinically indicated.  EKG:     EKG 05/19/2021: Sinus bradycardia at rate of 56 bpm, left atrial enlargement, otherwise normal EKG.  Compared to 11/11/2020, no significant change.  EKG 09/02/18/20: Probably ectopic atrial rhythm at the rate of 99 bpm with borderline first-degree AV block.  Normal axis.  Nonspecific T abnormality.   Assessment     ICD-10-CM   1. Essential hypertension  I10 EKG 12-Lead    hydrALAZINE (APRESOLINE) 25 MG tablet    2. Paroxysmal atrial fibrillation (HCC)  I48.0     3. Hypercholesteremia  E78.00       There are no discontinued medications.  Meds ordered this encounter  Medications   hydrALAZINE (APRESOLINE) 25 MG tablet    Sig: Take 1 tablet (25 mg total) by mouth 3 (three) times daily. Can take 1 extra tablet if SBP >150    Dispense:  270 tablet    Refill:  3   Recommendations:    [name]  is a 87 y.o. Caucasian female with history of paroxysmal atrial fibrillation, hypertension and hyperlipidemia.  67-month office visit, for paroxysmal atrial fibrillation and hypertension follow-up.  She has  noticed elevated blood pressure, I will start her on hydralazine 25 mg 3 times daily, advised her to keep blood pressure recordings for the next 1 week to see how she responds goal blood pressure is 140/80 mmHg.  Otherwise she is maintaining sinus rhythm and tolerating anticoagulation well.  No bleeding diathesis.  I will see her back in 6 months for follow-up.  Her lipids reviewed, under good control.     Adrian Prows, MD, [facility] 05/19/2021, 3:24 PM Office: [phone]

## 2021-05-20 NOTE — Progress Notes (Signed)
Location:  [place] of Service:  Clinic (12)  Provider:   Code Status:  Goals of Care:  Advanced Directives 05/18/2021  Does Patient Have a Medical Advance Directive? Yes  Type of Advance Directive Out of facility DNR (pink MOST or yellow form);Healthcare Power of Attorney  Does patient want to make changes to medical advance directive? No - Patient declined  Copy of [place] in Chart? -  Pre-existing out of facility DNR order (yellow form or pink MOST form) Yellow form placed in chart (order not valid for inpatient use)     Chief Complaint  Patient presents with   Medical Management of Chronic Issues    Patient returns to the clinic for follow up.    Quality Metric Gaps    Flu shot    HPI: Patient is a 87 y.o. female seen today for medical management of chronic diseases.    Patient has h/o PAF Follows with Dr Einar Gip Doing well. Was tachycardic initially but then her HR slowed down On Xarelto Covid infection Got covid positive few weeks ago No Documentation if she contacted our office are was treated She says she has recovered with only cough as lingering effect Appetite is good No Fever or Fatigue or SOB Low back pain Had Ibuprofen in her bag. Say she sometimes take that  Also has h/o HLD, Chronic Venous insufficiency Left Carotid Stenosis, Hypertension Continues to be very active and does water Aerobics Still drives walks with no assists. Continues to be indepedent in her ADLS,and IADLS. Is retired Pharmacist, hospital     Past Medical History:  Diagnosis Date   Actinic keratosis 09/23/2012   Anal fissure and fistula 07/28/2008   Arthralgia of temporomandibular joint 12/30/2009   Atrial fibrillation (HCC)    Atrial tachycardia ([place])    s/p ablation 2008. [facility] (R free wall Atach)   Atrophy of vulva 09/23/2012   Blepharochalasis 03/01/2011   Cardiac dysrhythmia, unspecified 11/10/2009   Dermatophytosis of nail 02/28/2008    Dizziness and giddiness 04/03/2007   Dysphagia, unspecified(787.20) 01/17/2012   Edema 11/21/2006   External hemorrhoids without mention of complication 70/26/3785   HTN (hypertension)    Hyperlipidemia    Insomnia, unspecified 11/18/2004   Internal hemorrhoids without mention of complication 8/85/0277   Left bundle branch hemiblock    Loss of weight 03/05/2012   Myalgia and myositis, unspecified 01/05/2009   Orthostatic hypotension 03/22/2007   Other malaise and fatigue 03/05/2012   Pain in joint, site unspecified 02/28/2008   Palpitations 11/21/2006   Personality change due to conditions classified elsewhere    Pterygium eye, right 06/07/2020   Rectocele 09/23/2012   Restless legs syndrome (RLS)    Rosacea 09/23/2012   Sebaceous cyst 03/01/2011   Thumb pain 11/20/2012   Unspecified hereditary and idiopathic peripheral neuropathy     Past Surgical History:  Procedure Laterality Date   BLADDER SUSPENSION  1998   CATARACT EXTRACTION W/ INTRAOCULAR LENS IMPLANT Right 4/8//2008   Dr. Charise Killian   catheter ablation  2008   for atrial tachycardia   CYST EXCISION  1998   mucus cyst removal from finger   PTERYGIUM EXCISION  06/07/2020   TOTAL ABDOMINAL HYSTERECTOMY  2001   uterine bleeding    Allergies  Allergen Reactions   Erythromycin Nausea And Vomiting   Flecainide Acetate     Postural hypotension   Penicillins     Outpatient Encounter Medications as of 05/18/2021  Medication Sig  acetaminophen (TYLENOL) 500 MG tablet Take 1,000 mg by mouth 3 (three) times daily as needed.   cholecalciferol (VITAMIN D) 1000 UNITS tablet Take 1,000 Units by mouth daily.   cycloSPORINE (RESTASIS) 0.05 % ophthalmic emulsion Place 1 drop into both eyes 2 (two) times daily.   diltiazem (CARDIZEM CD) 240 MG 24 hr capsule TAKE ONE CAPSULE BY MOUTH DAILY   furosemide (LASIX) 20 MG tablet TAKE 1 TABLET ONCE DAILY AS NEEDED FOR SWELLING- TAKE 1/2 TABLET FOR LEG SWELLING AS NEEDED.   metoprolol succinate (TOPROL-XL)  50 MG 24 hr tablet TAKE 1/2 TABLET EVERY DAY.   Multiple Vitamins-Minerals (PRESERVISION AREDS PO) Take 2 tablets by mouth daily.    olmesartan-hydrochlorothiazide (BENICAR HCT) 40-12.5 MG tablet TAKE ONE TABLET BY MOUTH IN THE MORNING   rosuvastatin (CRESTOR) 20 MG tablet TAKE ONE TABLET BY MOUTH ONCE DAILY   XARELTO 20 MG TABS tablet TAKE 1 TABLET ONCE DAILY WITH DINNER.   Homeopathic Products (ZICAM COLD REMEDY PO) Take 1 tablet by mouth as needed.   predniSONE (DELTASONE) 20 MG tablet Take 1 tablet (20 mg total) by mouth 2 (two) times daily with a meal.   No facility-administered encounter medications on file as of 05/18/2021.    Review of Systems:  Review of Systems  Constitutional: Negative.   HENT: Negative.    Respiratory:  Positive for cough.   Cardiovascular:  Positive for leg swelling.  Gastrointestinal: Negative.   Genitourinary: Negative.   Musculoskeletal:  Positive for back pain.  Skin: Negative.   Neurological: Negative.   Psychiatric/Behavioral: Negative.     Health Maintenance  Topic Date Due   INFLUENZA VACCINE  03/14/2021   COVID-19 Vaccine (5 - Booster for Moderna series) 08/18/2021   TETANUS/TDAP  07/14/2025   DEXA SCAN  Completed   Zoster Vaccines- Shingrix  Completed   HPV VACCINES  Aged Out    Physical Exam: Vitals:   05/18/21 1042  BP: (!) 168/110  Pulse: (!) 111  Temp: (!) 96.7 F (35.9 C)  SpO2: 98%  Weight: 142 lb (64.4 kg)  Height: 5\' 5"  (1.651 m)   Body mass index is 23.63 kg/m. Physical Exam Constitutional: Oriented to person, place, and time. Well-developed and well-nourished.  HENT:  Head: Normocephalic.  Mouth/Throat: Oropharynx is clear and moist.  Eyes: Pupils are equal, round, and reactive to light.  Ears TM clear with mild wax Neck: Neck supple.  Cardiovascular: Tachycardic Irregular  and normal heart sounds.  No murmur heard. Pulmonary/Chest: Effort normal and breath sounds normal. No respiratory distress. No wheezes. She  has no rales.  Abdominal: Soft. Bowel sounds are normal. No distension. There is no tenderness. There is no rebound.  Musculoskeletal: Chronic Venous changes in LE  Lymphadenopathy: none Neurological: Alert and oriented to person, place, and time. Gait stable Skin: Skin is warm and dry.  Psychiatric: Normal mood and affect. Behavior is normal. Thought content normal.   Labs reviewed: Basic Metabolic Panel: Recent Labs    10/28/20 0000  NA 140  K 3.6  CL 104  CO2 23*  BUN 26*  CREATININE 0.7  CALCIUM 9.3   Liver Function Tests: No results for input(s): AST, ALT, ALKPHOS, BILITOT, PROT, ALBUMIN in the last 8760 hours. No results for input(s): LIPASE, AMYLASE in the last 8760 hours. No results for input(s): AMMONIA in the last 8760 hours. CBC: Recent Labs    10/28/20 0000  WBC 7.0  HGB 13.1  HCT 38  PLT 254   Lipid Panel: No  results for input(s): CHOL, HDL, LDLCALC, TRIG, CHOLHDL, LDLDIRECT in the last 8760 hours. No results found for: HGBA1C  Procedures since last visit: No results found.  Assessment/Plan Paroxysmal atrial fibrillation (HCC) Was tachycardic initially Has appointment with Dr Einar Gip tomorrow On Cardizem, Metoprolol Continue on Xarelto Essential hypertension BP mildily elevated Did come down later On Cardizem, Metoprolol, and Benicar  Pure hypercholesterolemia Continue statin LDL 85 in 11/21 Venous insufficiency of both lower extremities Stable Uses Lasix PRN Stenosis of left carotid artery Follows with Cardiology Chronic bilateral low back pain without sciatica Do not use Ibuprofen  Tylenol and Voltaren gel COVID-19 virus infection Only Lingering Cough  Get Covid Booster by Nov Osteopenia Tscore -2.2 in Neck of Femur Restart Calcium On Vit D  Continue Exercise  Labs/tests ordered:  CBC,CMP,TSH,Lipid panel,  Next appt:  11/14/2021

## 2021-06-03 ENCOUNTER — Other Ambulatory Visit: Payer: Self-pay | Admitting: Cardiology

## 2021-06-03 DIAGNOSIS — R600 Localized edema: Secondary | ICD-10-CM

## 2021-06-21 ENCOUNTER — Ambulatory Visit: Payer: Medicare PPO | Attending: Internal Medicine

## 2021-06-21 ENCOUNTER — Other Ambulatory Visit: Payer: Self-pay | Admitting: Cardiology

## 2021-06-21 ENCOUNTER — Other Ambulatory Visit (HOSPITAL_BASED_OUTPATIENT_CLINIC_OR_DEPARTMENT_OTHER): Payer: Self-pay

## 2021-06-21 DIAGNOSIS — I10 Essential (primary) hypertension: Secondary | ICD-10-CM

## 2021-06-21 DIAGNOSIS — Z23 Encounter for immunization: Secondary | ICD-10-CM

## 2021-06-21 MED ORDER — INFLUENZA VAC A&B SA ADJ QUAD 0.5 ML IM PRSY
PREFILLED_SYRINGE | INTRAMUSCULAR | 0 refills | Status: DC
  Filled 2021-06-21: qty 0.5, 1d supply, fill #0

## 2021-06-21 MED ORDER — MODERNA COVID-19 BIVAL BOOSTER 50 MCG/0.5ML IM SUSP
INTRAMUSCULAR | 0 refills | Status: DC
  Filled 2021-06-21: qty 0.5, 1d supply, fill #0

## 2021-06-21 NOTE — Progress Notes (Signed)
   Covid-19 Vaccination Clinic  Name:  Rebekah Graham    MRN: [id number] DOB: 05/23/1929  06/21/2021  Rebekah Graham was observed post Covid-19 immunization for 15 minutes without incident. She was provided with Vaccine Information Sheet and instruction to access the V-Safe system.   Rebekah Graham was instructed to call [phone] with any severe reactions post vaccine: Difficulty breathing  Swelling of face and throat  A fast heartbeat  A bad rash all over body  Dizziness and weakness   Immunizations Administered     Name Date Dose VIS Date Route   Moderna Covid-19 vaccine Bivalent Booster 06/21/2021 10:00 AM 0.5 mL 03/26/2021 Intramuscular   Manufacturer: Moderna   Lot: [id number]   [place]: 42103-128-11

## 2021-07-19 ENCOUNTER — Ambulatory Visit: Payer: Medicare PPO | Admitting: Family

## 2021-07-19 ENCOUNTER — Telehealth: Payer: Self-pay

## 2021-07-19 NOTE — Telephone Encounter (Signed)
[facility]'s RN,  Autumn called stating the patient was seen in the satellite clinic and complains of  stomach pain with a low BP at 80/50. Amy is there for SNF patients but this is a independent resident. She wants to know if the patient need an apnt. or a medication change. She can be reached at [phone].

## 2021-07-19 NOTE — Telephone Encounter (Signed)
Talked to  Rebekah Graham. I have discontinued her Hydralazine. Can you make her appointment to see someone in office tomorrow. And Then let Rebekah Graham know ?

## 2021-07-20 ENCOUNTER — Other Ambulatory Visit: Payer: Self-pay

## 2021-07-20 ENCOUNTER — Ambulatory Visit (INDEPENDENT_AMBULATORY_CARE_PROVIDER_SITE_OTHER): Payer: Medicare PPO | Admitting: Family Medicine

## 2021-07-20 ENCOUNTER — Encounter: Payer: Self-pay | Admitting: Family Medicine

## 2021-07-20 VITALS — BP 118/70 | HR 131 | Temp 96.9°F | Ht 65.0 in | Wt 135.2 lb

## 2021-07-20 DIAGNOSIS — R2681 Unsteadiness on feet: Secondary | ICD-10-CM

## 2021-07-20 DIAGNOSIS — I10 Essential (primary) hypertension: Secondary | ICD-10-CM | POA: Diagnosis not present

## 2021-07-20 DIAGNOSIS — I480 Paroxysmal atrial fibrillation: Secondary | ICD-10-CM

## 2021-07-20 NOTE — Progress Notes (Signed)
Primary Physician/Referring:  [name], MD  Patient ID: Rodolph Bong, female    DOB: 07/25/1929, 87 y.o.   MRN: [id number]  Chief Complaint  Patient presents with   pt says she feels unsteady   bleedig in ear   HPI:     [name]  is a 87 y.o. Caucasian female with history of paroxysmal atrial fibrillation, hypertension and hyperlipidemia.    Patient presents for urgent visit with concerns of low blood pressure and tachycardia.  Patient was seen by PCP yesterday and he increased her metoprolol from 25 mg to 50 mg.  Patient's hydralazine which was added at last office visit was discontinued by PCP 07/19/2021 due to hypotension.  She now presents to our office for further evaluation.  Patient reports feeling increased fatigue over the last 2 weeks, and felt acutely worse yesterday including worsening fatigue as well as "unsteadiness".  She denies chest pain, palpitations, syncope, near syncope.  Denies orthopnea, PND, leg edema.  She does have dyspnea on exertion.  She continues to tolerate Xarelto without bleeding diathesis.  However patient does note that she "thinks she may have fallen" in the last 2 weeks.  She reports tenderness to palpation on the back of her head.  She is accompanied by her friend Webb Silversmith at [employer] office visit.  Past Medical History:  Diagnosis Date   Actinic keratosis 09/23/2012   Anal fissure and fistula 07/28/2008   Arthralgia of temporomandibular joint 12/30/2009   Atrial fibrillation (HCC)    Atrial tachycardia ([place])    s/p ablation 2008. [facility] (R free wall Atach)   Atrophy of vulva 09/23/2012   Blepharochalasis 03/01/2011   Cardiac dysrhythmia, unspecified 11/10/2009   Dermatophytosis of nail 02/28/2008   Dizziness and giddiness 04/03/2007   Dysphagia, unspecified(787.20) 01/17/2012   Edema 11/21/2006   External hemorrhoids without mention of complication 15/40/0867   HTN (hypertension)    Hyperlipidemia    Insomnia, unspecified 11/18/2004    Internal hemorrhoids without mention of complication 01/31/5092   Left bundle branch hemiblock    Loss of weight 03/05/2012   Myalgia and myositis, unspecified 01/05/2009   Orthostatic hypotension 03/22/2007   Other malaise and fatigue 03/05/2012   Pain in joint, site unspecified 02/28/2008   Palpitations 11/21/2006   Personality change due to conditions classified elsewhere    Pterygium eye, right 06/07/2020   Rectocele 09/23/2012   Restless legs syndrome (RLS)    Rosacea 09/23/2012   Sebaceous cyst 03/01/2011   Thumb pain 11/20/2012   Unspecified hereditary and idiopathic peripheral neuropathy    Past Surgical History:  Procedure Laterality Date   BLADDER SUSPENSION  1998   CATARACT EXTRACTION W/ INTRAOCULAR LENS IMPLANT Right 4/8//2008   Dr. Charise Killian   catheter ablation  2008   for atrial tachycardia   CYST EXCISION  1998   mucus cyst removal from finger   PTERYGIUM EXCISION  06/07/2020   TOTAL ABDOMINAL HYSTERECTOMY  2001   uterine bleeding   Social History   Tobacco Use   Smoking status: Never   Smokeless tobacco: Never   Tobacco comments:    tobacco  - none  Substance Use Topics   Alcohol use: Yes    Comment: 1-2 glasses of wine/gin daily     Family History  Problem Relation Age of Onset   Heart disease Mother    Heart disease Father    Cancer Sister        breast    ROS  Review of Systems  Constitutional: Positive for malaise/fatigue. Negative for weight gain.  Cardiovascular:  Positive for dyspnea on exertion. Negative for chest pain, claudication, leg swelling, near-syncope, orthopnea, palpitations, paroxysmal nocturnal dyspnea and syncope.  Skin:        Easy bruising  Musculoskeletal:  Positive for back pain and joint pain.  Gastrointestinal:  Negative for melena.  Neurological:  Positive for weakness. Negative for dizziness.  Objective  Blood pressure (!) 73/54, pulse (!) 130, height 5\' 5"  (1.651 m), weight 135 lb (61.2 kg), SpO2 97 %. Body mass index is 22.47  kg/m.    Physical Exam Vitals reviewed.  HENT:     Head: Normocephalic and atraumatic.   Cardiovascular:     Rate and Rhythm: Regular rhythm. Tachycardia present.     Pulses: Intact distal pulses.     Heart sounds: S1 normal and S2 normal. No murmur heard.   No gallop.  Pulmonary:     Effort: Pulmonary effort is normal. No respiratory distress.     Breath sounds: No wheezing, rhonchi or rales.  Musculoskeletal:     Right lower leg: No edema.     Left lower leg: No edema.  Skin:    Findings: Bruising (right and left lateral aspects of patients neck) present.  Neurological:     General: No focal deficit present.     Mental Status: She is alert and oriented to person, place, and time.     Cranial Nerves: No cranial nerve deficit.     Motor: No weakness.     Comments: Muscle strength intact bilateral upper and lower extremities    Laboratory examination:   CMP Latest Ref Rng & Units 07/20/2021 10/28/2020 04/20/2020  Glucose 65 - 99 mg/dL 119(H) - -  BUN 7 - 25 mg/dL 34(H) 26(A) 19  Creatinine 0.60 - 0.95 mg/dL 1.34(H) 0.7 0.8  Sodium 135 - 146 mmol/L 141 140 136(A)  Potassium 3.5 - 5.3 mmol/L 3.4(L) 3.6 3.8  Chloride 98 - 110 mmol/L 99 104 99  CO2 20 - 32 mmol/L 28 23(A) 24(A)  Calcium 8.6 - 10.4 mg/dL 9.3 9.3 8.8  Alkaline Phos 25 - 125 - - -  AST 13 - 35 - - 22  ALT 7 - 35 - - 14   CBC Latest Ref Rng & Units 10/28/2020 04/20/2020 04/20/2020  WBC - 7.0 4.9 4.9  Hemoglobin 12.0 - 16.0 13.1 13.0 13.0  Hematocrit 36 - 46 38 39 246(A)  Platelets 150 - 399 254 - -   Lipid Panel     Component Value Date/Time   CHOL 160 04/22/2019 0000   TRIG 73 04/22/2019 0000   HDL 59 04/22/2019 0000   LDLCALC 87 04/22/2019 0000   HEMOGLOBIN A1C No results found for: HGBA1C, MPG TSH No results for input(s): TSH in the last 8760 hours.  Allergies   Allergies  Allergen Reactions   Erythromycin Nausea And Vomiting   Flecainide Acetate     Postural hypotension   Penicillins      Medications Prior to Visit:   Outpatient Medications Prior to Visit  Medication Sig Dispense Refill   acetaminophen (TYLENOL) 500 MG tablet Take 1,000 mg by mouth 3 (three) times daily as needed.     cholecalciferol (VITAMIN D) 1000 UNITS tablet Take 1,000 Units by mouth daily.     cycloSPORINE (RESTASIS) 0.05 % ophthalmic emulsion Place 1 drop into both eyes 2 (two) times daily.     diltiazem (CARDIZEM CD) 240 MG 24 hr capsule TAKE ONE CAPSULE BY  MOUTH DAILY 90 capsule 0   Homeopathic Products (ZICAM COLD REMEDY PO) Take 1 tablet by mouth as needed.     influenza vaccine adjuvanted (FLUAD) 0.5 ML injection Inject into the muscle. 0.5 mL 0   Multiple Vitamins-Minerals (PRESERVISION AREDS PO) Take 2 tablets by mouth daily.      rosuvastatin (CRESTOR) 20 MG tablet TAKE ONE TABLET BY MOUTH ONCE DAILY 90 tablet 0   XARELTO 20 MG TABS tablet TAKE 1 TABLET ONCE DAILY WITH DINNER. 90 tablet 1   metoprolol succinate (TOPROL-XL) 50 MG 24 hr tablet TAKE 1/2 TABLET EVERY DAY. (Patient taking differently: Take 50 mg by mouth daily.) 45 tablet 3   olmesartan-hydrochlorothiazide (BENICAR HCT) 40-12.5 MG tablet TAKE ONE TABLET BY MOUTH IN THE MORNING 30 tablet 6   furosemide (LASIX) 20 MG tablet TAKE 1 TABLET ONCE DAILY AS NEEDED FOR SWELLING- TAKE 1/2 TABLET FOR LEG SWELLING AS NEEDED. (Patient not taking: Reported on 07/21/2021) 60 tablet 0   No facility-administered medications prior to visit.   Final Medications at End of Visit    Current Meds  Medication Sig   acetaminophen (TYLENOL) 500 MG tablet Take 1,000 mg by mouth 3 (three) times daily as needed.   amiodarone (PACERONE) 200 MG tablet Take 400 mg (2 tablets) twice daily for 1 week. Then take 200 mg (1 tablet) twice daily.   cholecalciferol (VITAMIN D) 1000 UNITS tablet Take 1,000 Units by mouth daily.   cycloSPORINE (RESTASIS) 0.05 % ophthalmic emulsion Place 1 drop into both eyes 2 (two) times daily.   diltiazem (CARDIZEM CD) 240 MG 24 hr  capsule TAKE ONE CAPSULE BY MOUTH DAILY   Homeopathic Products (ZICAM COLD REMEDY PO) Take 1 tablet by mouth as needed.   influenza vaccine adjuvanted (FLUAD) 0.5 ML injection Inject into the muscle.   Multiple Vitamins-Minerals (PRESERVISION AREDS PO) Take 2 tablets by mouth daily.    rosuvastatin (CRESTOR) 20 MG tablet TAKE ONE TABLET BY MOUTH ONCE DAILY   XARELTO 20 MG TABS tablet TAKE 1 TABLET ONCE DAILY WITH DINNER.   [DISCONTINUED] metoprolol succinate (TOPROL-XL) 50 MG 24 hr tablet TAKE 1/2 TABLET EVERY DAY. (Patient taking differently: Take 50 mg by mouth daily.)   [DISCONTINUED] olmesartan-hydrochlorothiazide (BENICAR HCT) 40-12.5 MG tablet TAKE ONE TABLET BY MOUTH IN THE MORNING    Radiology:  No results found.  Cardiac Studies:   Echo- 07/13/2015 1. Left ventricle cavity is normal in size. Mild concentric hypertrophy of the left ventricle. Normal global wall motion. Calculated EF 66%. 2. Left atrial cavity is mildly dilated. 3. Trace aortic regurgitation. 4. Mild mitral regurgitation. 5. Mild tricuspid regurgitation. No evidence of pulmonary hypertension.  Carotid artery duplex 10/14/2020: Minimal stenosis in the right internal carotid artery (minimal). Stenosis in the right external carotid artery (<50%). Stenosis in the left internal carotid artery (16-49%). Stenosis in the left external carotid artery (<50%). Antegrade right vertebral artery flow. Antegrade left vertebral artery flow. Compared to 05/03/2020, left ICA stenosis of 50-69% is now 16-49%.  Upper limit of the spectrum.  Overall, no significant change. Follow up in one year is appropriate if clinically indicated.  EKG:   07/21/2021: Atypical atrial flutter with 2-1 conduction at a rate of 140 bpm.  EKG 05/19/2021: Sinus bradycardia at rate of 56 bpm, left atrial enlargement, otherwise normal EKG.  Compared to 11/11/2020, no significant change.  EKG 09/02/18/20: Probably ectopic atrial rhythm at the rate of 99 bpm  with borderline first-degree AV block.  Normal axis.  Nonspecific  T abnormality.   Assessment     ICD-10-CM   1. Atypical atrial flutter (HCC)  I48.4     2. Other specified hypotension  I95.89     3. Paroxysmal atrial fibrillation (HCC)  I48.0 EKG 12-Lead      Medications Discontinued During This Encounter  Medication Reason   olmesartan-hydrochlorothiazide (BENICAR HCT) 40-12.5 MG tablet    metoprolol succinate (TOPROL-XL) 50 MG 24 hr tablet Discontinued by provider    Meds ordered this encounter  Medications   amiodarone (PACERONE) 200 MG tablet    Sig: Take 400 mg (2 tablets) twice daily for 1 week. Then take 200 mg (1 tablet) twice daily.    Dispense:  90 tablet    Refill:  3   metoprolol succinate (TOPROL-XL) 50 MG 24 hr tablet    Sig: Take 1 tablet (50 mg total) by mouth daily. Take with or immediately following a meal.    Dispense:  30 tablet    Refill:  0   Recommendations:    [name]  is a 87 y.o. Caucasian female with history of paroxysmal atrial fibrillation, hypertension and hyperlipidemia.    Patient presents for urgent visit with concerns of low blood pressure and tachycardia.  Patient was seen by PCP yesterday and he increased her metoprolol from 25 mg to 50 mg.  Patient's hydralazine which was added at last office visit was discontinued by PCP 07/19/2021 due to hypotension.  She now presents to our office for further evaluation.  Patient remains hypotensive at today's office visit and EKG shows atypical atrial flutter at a rate of 140 bpm.  Discussed at length with patient management options including evaluation in the emergency department, admission to the hospital, direct-current cardioversion as patient is on long-term oral anticoagulation.  Shared decision was to proceed with outpatient direct-current cardioversion later this afternoon.  Given hypotension discontinue telmisartan/hydrochlorothiazide.  Advised patient to continue metoprolol and diltiazem.   Patient will start amiodarone 400 mg p.o. twice daily for 1 week, then reduce to amiodarone 200 mg p.o. twice daily.  In regard to fall and hematoma noted on the back of patient's head, as well as bruising around her neck, patient is without signs or symptoms of CVA and fall occurred >1 week ago therefore do not feel imaging of the head is necessary indicated at this time.  Counseled at length regarding signs and symptoms that would warrant urgent or emergent evaluation.  Patient will request visits from home health nurse at her independent living facility until follow-up in our office.  Will follow patient closely in 5 days with office visit after cardioversion later this afternoon.  Patient was seen in collaboration with Dr. Einar Gip. He also reviewed patient's chart and examined/interviewed the patient independently. Dr. Einar Gip is in agreement of the plan.    This was a 50-minute encounter with face-to-face counseling, medical records review, coordination of care, explanation of complex medical issues, complex medical decision making.     Alethia Berthold, PA-C 07/21/2021, 4:25 PM Office: [phone]  Addendum 07/21/2021:  Patient presented to [facility] for direct-current cardioversion.  Upon presentation patient was in normal sinus rhythm at a rate of 80 bpm with occasional episodes of paroxysmal atrial fibrillation at a rate of 120 bpm.  Patient's blood pressure at that time 123/67 mmHg.  Patient's cardioversion was therefore canceled and she was discharged home.  We will keep previously scheduled follow-up appointment with our office.  In the meantime counseled patient and her friend regarding  signs and symptoms that would warrant urgent or emergent evaluation, they verbalized understanding agreement.  Patient lives in the independent living facility, therefore nursing staff will check on her daily to monitor heart rate and blood pressure over the next 1 week.   Alethia Berthold, PA-C 07/21/2021, 4:25 PM Office: [phone]

## 2021-07-20 NOTE — Patient Instructions (Signed)
Increase metoprolol to a whole pill once a day Continue olmesartan at the same dose Continue diltiazem at the same dose.  I would recommend not taking all of these medicines at the same time, in other words take 2 of them in the morning and 1 at night.  You may be divide these anyway you want to.

## 2021-07-20 NOTE — Progress Notes (Signed)
Provider:  Alain Honey, MD  Careteam: Patient Care Team: [name], MD as PCP - General (Internal Medicine) [facility], [facility], Argentina Donovan, NP as Nurse Practitioner ([place]) Adrian Prows, MD as Consulting Physician (Cardiology) Earlean Polka, MD as Consulting Physician (Ophthalmology)  PLACE OF SERVICE:  [place]  Advanced Directive information    Allergies  Allergen Reactions   Erythromycin Nausea And Vomiting   Flecainide Acetate     Postural hypotension   Penicillins     Chief Complaint  Patient presents with   Acute Visit    Patient presents today for balance while walking or standing for about 2 weeks now.     HPI: Patient is a 87 y.o. female patient presents with some balance disturbance which she has noted for the past 2 weeks.  It is worse when she is walking or stands up.  She had previously been on hydralazine which may be the cause especially with the postural drop when changing position.  She has not had any falls. She has not had any fever or other signs of infection. When asked about hydration she endorses probably decrease in fluids for reasons that are known only to older folks. She also denies any recent palpitations or subjective feelings of rapid heartbeat.  Review of Systems:  Review of Systems  Constitutional:  Positive for weight loss.  Respiratory: Negative.    Cardiovascular:  Negative for chest pain, palpitations and leg swelling.  Neurological:  Positive for dizziness.  Psychiatric/Behavioral: Negative.    All other systems reviewed and are negative.  Past Medical History:  Diagnosis Date   Actinic keratosis 09/23/2012   Anal fissure and fistula 07/28/2008   Arthralgia of temporomandibular joint 12/30/2009   Atrial fibrillation (HCC)    Atrial tachycardia ([place])    s/p ablation 2008. [facility] (R free wall Atach)   Atrophy of vulva 09/23/2012   Blepharochalasis 03/01/2011   Cardiac dysrhythmia,  unspecified 11/10/2009   Dermatophytosis of nail 02/28/2008   Dizziness and giddiness 04/03/2007   Dysphagia, unspecified(787.20) 01/17/2012   Edema 11/21/2006   External hemorrhoids without mention of complication 56/38/7564   HTN (hypertension)    Hyperlipidemia    Insomnia, unspecified 11/18/2004   Internal hemorrhoids without mention of complication 3/32/9518   Left bundle branch hemiblock    Loss of weight 03/05/2012   Myalgia and myositis, unspecified 01/05/2009   Orthostatic hypotension 03/22/2007   Other malaise and fatigue 03/05/2012   Pain in joint, site unspecified 02/28/2008   Palpitations 11/21/2006   Personality change due to conditions classified elsewhere    Pterygium eye, right 06/07/2020   Rectocele 09/23/2012   Restless legs syndrome (RLS)    Rosacea 09/23/2012   Sebaceous cyst 03/01/2011   Thumb pain 11/20/2012   Unspecified hereditary and idiopathic peripheral neuropathy    Past Surgical History:  Procedure Laterality Date   BLADDER SUSPENSION  1998   CATARACT EXTRACTION W/ INTRAOCULAR LENS IMPLANT Right 4/8//2008   Dr. Charise Killian   catheter ablation  2008   for atrial tachycardia   CYST EXCISION  1998   mucus cyst removal from finger   PTERYGIUM EXCISION  06/07/2020   TOTAL ABDOMINAL HYSTERECTOMY  2001   uterine bleeding   Social History:   reports that she has never smoked. She has never used smokeless tobacco. She reports current alcohol use. She reports that she does not use drugs.  Family History  Problem Relation Age of Onset   Heart disease Mother  Heart disease Father    Cancer Sister        breast    Medications: Patient's Medications  New Prescriptions   No medications on file  Previous Medications   ACETAMINOPHEN (TYLENOL) 500 MG TABLET    Take 1,000 mg by mouth 3 (three) times daily as needed.   CHOLECALCIFEROL (VITAMIN D) 1000 UNITS TABLET    Take 1,000 Units by mouth daily.   CYCLOSPORINE (RESTASIS) 0.05 % OPHTHALMIC EMULSION    Place 1 drop into both  eyes 2 (two) times daily.   DILTIAZEM (CARDIZEM CD) 240 MG 24 HR CAPSULE    TAKE ONE CAPSULE BY MOUTH DAILY   FUROSEMIDE (LASIX) 20 MG TABLET    TAKE 1 TABLET ONCE DAILY AS NEEDED FOR SWELLING- TAKE 1/2 TABLET FOR LEG SWELLING AS NEEDED.   HOMEOPATHIC PRODUCTS (ZICAM COLD REMEDY PO)    Take 1 tablet by mouth as needed.   INFLUENZA VACCINE ADJUVANTED (FLUAD) 0.5 ML INJECTION    Inject into the muscle.   METOPROLOL SUCCINATE (TOPROL-XL) 50 MG 24 HR TABLET    TAKE 1/2 TABLET EVERY DAY.   MULTIPLE VITAMINS-MINERALS (PRESERVISION AREDS PO)    Take 2 tablets by mouth daily.    OLMESARTAN-HYDROCHLOROTHIAZIDE (BENICAR HCT) 40-12.5 MG TABLET    TAKE ONE TABLET BY MOUTH IN THE MORNING   ROSUVASTATIN (CRESTOR) 20 MG TABLET    TAKE ONE TABLET BY MOUTH ONCE DAILY   XARELTO 20 MG TABS TABLET    TAKE 1 TABLET ONCE DAILY WITH DINNER.  Modified Medications   No medications on file  Discontinued Medications   COVID-19 MRNA BIVALENT VACCINE, MODERNA, (MODERNA COVID-19 BIVAL BOOSTER) 50 MCG/0.5ML INJECTION    Inject into the muscle.    Physical Exam:  Vitals:   07/20/21 0835  BP: 118/70  Pulse: (!) 131  Temp: (!) 96.9 F (36.1 C)  SpO2: 96%  Weight: 135 lb 3.2 oz (61.3 kg)  Height: $Remove'5\' 5"'wCBOrVf$  (1.651 m)   Body mass index is 22.5 kg/m. Wt Readings from Last 3 Encounters:  07/20/21 135 lb 3.2 oz (61.3 kg)  05/19/21 142 lb (64.4 kg)  05/18/21 142 lb (64.4 kg)    Physical Exam Vitals and nursing note reviewed.  Constitutional:      Appearance: Normal appearance.  Cardiovascular:     Rate and Rhythm: Regular rhythm. Tachycardia present.  Pulmonary:     Effort: Pulmonary effort is normal.     Breath sounds: Normal breath sounds.  Neurological:     General: No focal deficit present.     Mental Status: She is alert and oriented to person, place, and time.     Coordination: Coordination normal.     Gait: Gait abnormal.     Comments: Gait is broad-based    Labs reviewed: Basic Metabolic  Panel: Recent Labs    10/28/20 0000  NA 140  K 3.6  CL 104  CO2 23*  BUN 26*  CREATININE 0.7  CALCIUM 9.3   Liver Function Tests: No results for input(s): AST, ALT, ALKPHOS, BILITOT, PROT, ALBUMIN in the last 8760 hours. No results for input(s): LIPASE, AMYLASE in the last 8760 hours. No results for input(s): AMMONIA in the last 8760 hours. CBC: Recent Labs    10/28/20 0000  WBC 7.0  HGB 13.1  HCT 38  PLT 254   Lipid Panel: No results for input(s): CHOL, HDL, LDLCALC, TRIG, CHOLHDL, LDLDIRECT in the last 8760 hours. TSH: No results for input(s): TSH in the last 8760  hours. A1C: No results found for: HGBA1C   Assessment/Plan  1. Paroxysmal atrial fibrillation (HCC) EKG shows sinus tachycardia today with occasional ectopic.  Patient does not appear to be in A. fib or a flutter - EKG 12-Lead  2. Essential hypertension Blood pressure today is low.  She currently takes metoprolol olmesartan and diltiazem for blood pressure with hydralazine recently discontinued - BMP with eGFR([facility])  3. Unsteady gait when walking I suspect this may be related to blood pressure as well as tachycardia.  Certainly recommend hydration, 6 to 8 glasses of water per day.  In consultation with her cardiology office will increase metoprolol to 50 mg with the idea that we will give the ventricle more time to feel if the heart rate is slowed by increased beta-blockade.  This should raise the blood pressure to a more acceptable level   Alain Honey, MD [place] [phone]

## 2021-07-21 ENCOUNTER — Encounter (HOSPITAL_COMMUNITY): Payer: Self-pay | Admitting: Anesthesiology

## 2021-07-21 ENCOUNTER — Encounter (HOSPITAL_COMMUNITY)
Admission: AD | Disposition: A | Discharge status: Home / Self Care | Payer: Self-pay | Source: Ambulatory Visit | Attending: Cardiology

## 2021-07-21 ENCOUNTER — Ambulatory Visit (HOSPITAL_COMMUNITY)
Admission: AD | Admit: 2021-07-21 | Discharge: 2021-07-21 | Disposition: A | Discharge status: Home / Self Care | Payer: Medicare PPO | Source: Ambulatory Visit | Attending: Cardiology | Admitting: Cardiology

## 2021-07-21 ENCOUNTER — Ambulatory Visit: Payer: Medicare PPO | Admitting: Student

## 2021-07-21 ENCOUNTER — Encounter: Payer: Self-pay | Admitting: Student

## 2021-07-21 VITALS — BP 73/54 | HR 130 | Ht 65.0 in | Wt 135.0 lb

## 2021-07-21 DIAGNOSIS — I9589 Other hypotension: Secondary | ICD-10-CM

## 2021-07-21 DIAGNOSIS — I484 Atypical atrial flutter: Secondary | ICD-10-CM | POA: Diagnosis not present

## 2021-07-21 DIAGNOSIS — I10 Essential (primary) hypertension: Secondary | ICD-10-CM

## 2021-07-21 DIAGNOSIS — I491 Atrial premature depolarization: Secondary | ICD-10-CM | POA: Diagnosis not present

## 2021-07-21 DIAGNOSIS — I480 Paroxysmal atrial fibrillation: Secondary | ICD-10-CM | POA: Diagnosis not present

## 2021-07-21 LAB — BASIC METABOLIC PANEL WITH GFR
BUN/Creatinine Ratio: 25 (calc) — ABNORMAL HIGH (ref 6–22)
BUN: 34 mg/dL — ABNORMAL HIGH (ref 7–25)
CO2: 28 mmol/L (ref 20–32)
Calcium: 9.3 mg/dL (ref 8.6–10.4)
Chloride: 99 mmol/L (ref 98–110)
Creat: 1.34 mg/dL — ABNORMAL HIGH (ref 0.60–0.95)
Glucose, Bld: 119 mg/dL — ABNORMAL HIGH (ref 65–99)
Potassium: 3.4 mmol/L — ABNORMAL LOW (ref 3.5–5.3)
Sodium: 141 mmol/L (ref 135–146)
eGFR: 38 mL/min/{1.73_m2} — ABNORMAL LOW (ref >=60)

## 2021-07-21 SURGERY — CANCELLED PROCEDURE

## 2021-07-21 MED ORDER — METOPROLOL SUCCINATE ER 50 MG PO TB24: 50.0000 mg | ORAL_TABLET | Freq: Every day | ORAL | 0 refills | Status: DC

## 2021-07-21 MED ORDER — AMIODARONE HCL 200 MG PO TABS: ORAL_TABLET | ORAL | 3 refills | Status: DC

## 2021-07-21 NOTE — Anesthesia Preprocedure Evaluation (Deleted)
Anesthesia Evaluation  Patient identified by MRN, date of birth, ID band Patient awake    Reviewed: Allergy & Precautions, H&P , NPO status , Patient's Chart, lab work & pertinent test results  Airway Mallampati: II   Neck ROM: full    Dental   Pulmonary neg pulmonary ROS,    breath sounds clear to auscultation       Cardiovascular hypertension, + dysrhythmias Atrial Fibrillation  Rhythm:irregular Rate:Normal     Neuro/Psych  Neuromuscular disease    GI/Hepatic   Endo/Other    Renal/GU      Musculoskeletal  (+) Arthritis ,   Abdominal   Peds  Hematology   Anesthesia Other Findings   Reproductive/Obstetrics                             Anesthesia Physical Anesthesia Plan  ASA: 3  Anesthesia Plan: General   Post-op Pain Management:    Induction: Intravenous  PONV Risk Score and Plan: 3 and Treatment may vary due to age or medical condition  Airway Management Planned: Mask  Additional Equipment:   Intra-op Plan:   Post-operative Plan:   Informed Consent: I have reviewed the patients History and Physical, chart, labs and discussed the procedure including the risks, benefits and alternatives for the proposed anesthesia with the patient or authorized representative who has indicated his/her understanding and acceptance.     Dental advisory given  Plan Discussed with: CRNA, Anesthesiologist and Surgeon  Anesthesia Plan Comments:         Anesthesia Quick Evaluation

## 2021-07-21 NOTE — Progress Notes (Signed)
Pt arrived for cardioversion, appeared to be in NSR.  Confirmed with EKG.  HR 80s, occasional paroxysmal afib in 120s.  BP 123/67. Dr. Einar Gip aware.  Pt discharged home with friends.  Vista Lawman, RN

## 2021-07-21 NOTE — H&P (Signed)
Patient was seen in the office today with hypertension, not feeling well and upon evaluation, found to be in atypical a flutter with RVR.  In view of soft blood pressure, I decided to admit the patient however no hospital beds available.  I started her on amiodarone in the outpatient basis, scheduled for outpatient recurrent cardioversion.  Follow-up upon presentation, patient was back in sinus rhythm with atrial ectopy and paroxysms of brief A. fib.  Patient was discharged home as she has paroxysmal episodes and blood pressure was stable, we will see him back in the office in 4 to 5 days.

## 2021-07-21 NOTE — Patient Instructions (Signed)
Please refer to [facility] for cardioversion at approximately 230 this afternoon Stop olmesartan/hydrochlorothiazide Start amiodarone 400 mg twice daily for 1 week, then reduce to 200 mg twice daily. Continue to hold furosemide

## 2021-07-22 ENCOUNTER — Other Ambulatory Visit: Payer: Self-pay | Admitting: Adult Health

## 2021-07-22 ENCOUNTER — Other Ambulatory Visit: Payer: Self-pay | Admitting: Cardiology

## 2021-07-22 ENCOUNTER — Other Ambulatory Visit: Payer: Self-pay | Admitting: *Deleted

## 2021-07-22 ENCOUNTER — Other Ambulatory Visit: Payer: Self-pay

## 2021-07-22 DIAGNOSIS — I6523 Occlusion and stenosis of bilateral carotid arteries: Secondary | ICD-10-CM

## 2021-07-22 DIAGNOSIS — E7800 Pure hypercholesterolemia, unspecified: Secondary | ICD-10-CM

## 2021-07-22 MED ORDER — METOPROLOL SUCCINATE ER 50 MG PO TB24: 50.0000 mg | ORAL_TABLET | Freq: Every day | ORAL | 5 refills | Status: DC

## 2021-07-22 NOTE — Progress Notes (Signed)
I appreciate you keeping me in the loop.  Also she has been going in and out of atrial flutter, I have placed her on amiodarone 400 mg p.o. twice daily for now until she sees [place] back in 3 days.  We will continue diltiazem ER and give this medication only when systolic blood pressure is stable and greater than or equal to 120 mmHg for rate control.

## 2021-07-22 NOTE — Progress Notes (Signed)
Nurse report cardizem was held by cardiology as well due to low bp

## 2021-07-22 NOTE — Telephone Encounter (Signed)
Pharmacy requested refill. Dr. Sabra Heck Increased Metoprolol to One tablet daily 12/7

## 2021-07-22 NOTE — Progress Notes (Signed)
Pt will receive med management at [facility]. Toprol is discontinued due to low BP Followed by [facility]

## 2021-07-25 ENCOUNTER — Non-Acute Institutional Stay (SKILLED_NURSING_FACILITY): Payer: Medicare PPO | Admitting: Internal Medicine

## 2021-07-25 ENCOUNTER — Other Ambulatory Visit: Payer: Self-pay

## 2021-07-25 ENCOUNTER — Encounter: Payer: Self-pay | Admitting: Student

## 2021-07-25 ENCOUNTER — Ambulatory Visit: Payer: Medicare PPO | Admitting: Student

## 2021-07-25 ENCOUNTER — Encounter: Payer: Self-pay | Admitting: Internal Medicine

## 2021-07-25 VITALS — BP 131/75 | HR 69 | Ht 65.0 in | Wt 135.0 lb

## 2021-07-25 DIAGNOSIS — I484 Atypical atrial flutter: Secondary | ICD-10-CM | POA: Diagnosis not present

## 2021-07-25 DIAGNOSIS — R2681 Unsteadiness on feet: Secondary | ICD-10-CM

## 2021-07-25 DIAGNOSIS — E876 Hypokalemia: Secondary | ICD-10-CM | POA: Diagnosis not present

## 2021-07-25 DIAGNOSIS — I10 Essential (primary) hypertension: Secondary | ICD-10-CM

## 2021-07-25 DIAGNOSIS — E7800 Pure hypercholesterolemia, unspecified: Secondary | ICD-10-CM | POA: Diagnosis not present

## 2021-07-25 DIAGNOSIS — I9589 Other hypotension: Secondary | ICD-10-CM | POA: Diagnosis not present

## 2021-07-25 DIAGNOSIS — H9221 Otorrhagia, right ear: Secondary | ICD-10-CM | POA: Diagnosis not present

## 2021-07-25 DIAGNOSIS — I872 Venous insufficiency (chronic) (peripheral): Secondary | ICD-10-CM

## 2021-07-25 DIAGNOSIS — I950 Idiopathic hypotension: Secondary | ICD-10-CM | POA: Diagnosis not present

## 2021-07-25 DIAGNOSIS — T148XXA Other injury of unspecified body region, initial encounter: Secondary | ICD-10-CM

## 2021-07-25 LAB — BASIC METABOLIC PANEL
BUN: 13 (ref 4–21)
CO2: 27 — AB (ref 13–22)
Chloride: 104 (ref 99–108)
Creatinine: 0.7 (ref 0.5–1.1)
Glucose: 99
Potassium: 4.3 (ref 3.4–5.3)
Sodium: 143 (ref 137–147)

## 2021-07-25 LAB — COMPREHENSIVE METABOLIC PANEL: Calcium: 9.1 (ref 8.7–10.7)

## 2021-07-25 MED ORDER — DILTIAZEM HCL 60 MG PO TABS: 60.0000 mg | ORAL_TABLET | Freq: Three times a day (TID) | ORAL | 3 refills | Status: DC | PRN

## 2021-07-25 MED ORDER — AMIODARONE HCL 200 MG PO TABS: 200.0000 mg | ORAL_TABLET | Freq: Two times a day (BID) | ORAL | 3 refills | Status: DC

## 2021-07-25 NOTE — Progress Notes (Signed)
Primary Physician/Referring:  [name], MD  Patient ID: Rebekah Graham, female    DOB: 1929/06/19, 87 y.o.   MRN: [id number]  Chief Complaint  Patient presents with   Atrial Flutter   Follow-up   HPI:     Rebekah Graham  is a 87 y.o. Caucasian female with history of paroxysmal atrial fibrillation, hypertension and hyperlipidemia.    Patient presents for 4-day follow-up.  She was seen in our office last week and atypical atrial flutter at a rate of 140 bpm and hypotensive.  Her last office visit started patient on amiodarone and discontinued medications contributing to hypotension including diltiazem and metoprolol.  Patient is accompanied by her son Annie Main at bedside for today's office visit.  He reports that there is concern patient had previously been mismanaging her medications, these are now being managed by [facility] nurses where she lives.  Notably patient's son reports a decline in patient's memory abilities.  Patient is feeling much improved since last office visit.  She has had no recurrence of falls.  Denies chest pain, palpitations, syncope, near syncope.  Denies orthopnea, PND, leg edema.  Dyspnea and dizziness have resolved.  She continues to tolerate Xarelto without bleeding diathesis.  She is presently taking amiodarone for 100 mg p.o. twice daily, metoprolol and diltiazem have both been discontinued at [facility].  Past Medical History:  Diagnosis Date   Actinic keratosis 09/23/2012   Anal fissure and fistula 07/28/2008   Arthralgia of temporomandibular joint 12/30/2009   Atrial fibrillation (HCC)    Atrial tachycardia ([place])    s/p ablation 2008. [facility] (R free wall Atach)   Atrophy of vulva 09/23/2012   Blepharochalasis 03/01/2011   Cardiac dysrhythmia, unspecified 11/10/2009   Dermatophytosis of nail 02/28/2008   Dizziness and giddiness 04/03/2007   Dysphagia, unspecified(787.20) 01/17/2012   Edema 11/21/2006   External hemorrhoids without mention of complication  53/61/4431   HTN (hypertension)    Hyperlipidemia    Insomnia, unspecified 11/18/2004   Internal hemorrhoids without mention of complication 5/40/0867   Left bundle branch hemiblock    Loss of weight 03/05/2012   Myalgia and myositis, unspecified 01/05/2009   Orthostatic hypotension 03/22/2007   Other malaise and fatigue 03/05/2012   Pain in joint, site unspecified 02/28/2008   Palpitations 11/21/2006   Personality change due to conditions classified elsewhere    Pterygium eye, right 06/07/2020   Rectocele 09/23/2012   Restless legs syndrome (RLS)    Rosacea 09/23/2012   Sebaceous cyst 03/01/2011   Thumb pain 11/20/2012   Unspecified hereditary and idiopathic peripheral neuropathy    Past Surgical History:  Procedure Laterality Date   BLADDER SUSPENSION  1998   CATARACT EXTRACTION W/ INTRAOCULAR LENS IMPLANT Right 4/8//2008   Dr. Charise Killian   catheter ablation  2008   for atrial tachycardia   CYST EXCISION  1998   mucus cyst removal from finger   PTERYGIUM EXCISION  06/07/2020   TOTAL ABDOMINAL HYSTERECTOMY  2001   uterine bleeding   Social History   Tobacco Use   Smoking status: Never   Smokeless tobacco: Never   Tobacco comments:    tobacco  - none  Substance Use Topics   Alcohol use: Yes    Comment: 1-2 glasses of wine/gin daily     Family History  Problem Relation Age of Onset   Heart disease Mother    Heart disease Father    Cancer Sister        breast    ROS  Review of Systems  Constitutional: Negative for malaise/fatigue (resolved) and weight gain.  Cardiovascular:  Negative for chest pain, claudication, dyspnea on exertion (resolved), leg swelling, near-syncope, orthopnea, palpitations, paroxysmal nocturnal dyspnea and syncope.  Skin:        Easy bruising  Musculoskeletal:  Positive for back pain and joint pain.  Gastrointestinal:  Negative for melena.  Neurological:  Positive for weakness (improved). Negative for dizziness.  Objective  Blood pressure 131/75, pulse  69, height 5\' 5"  (1.651 m), weight 135 lb (61.2 kg), SpO2 98 %. Body mass index is 22.47 kg/m.    Physical Exam Vitals reviewed.  HENT:     Head:   Cardiovascular:     Rate and Rhythm: Normal rate and regular rhythm.     Pulses: Intact distal pulses.     Heart sounds: S1 normal and S2 normal. No murmur heard.   No gallop.  Pulmonary:     Effort: Pulmonary effort is normal. No respiratory distress.     Breath sounds: No wheezing, rhonchi or rales.  Musculoskeletal:     Right lower leg: No edema.     Left lower leg: No edema.  Skin:    Findings: Bruising (right and left lateral aspects of patients neck) present.  Neurological:     Mental Status: [name]     Comments: Muscle strength intact bilateral upper and lower extremities    Laboratory examination:   CMP Latest Ref Rng & Units 07/20/2021 10/28/2020 04/20/2020  Glucose 65 - 99 mg/dL 119(H) - -  BUN 7 - 25 mg/dL 34(H) 26(A) 19  Creatinine 0.60 - 0.95 mg/dL 1.34(H) 0.7 0.8  Sodium 135 - 146 mmol/L 141 140 136(A)  Potassium 3.5 - 5.3 mmol/L 3.4(L) 3.6 3.8  Chloride 98 - 110 mmol/L 99 104 99  CO2 20 - 32 mmol/L 28 23(A) 24(A)  Calcium 8.6 - 10.4 mg/dL 9.3 9.3 8.8  Alkaline Phos 25 - 125 - - -  AST 13 - 35 - - 22  ALT 7 - 35 - - 14   CBC Latest Ref Rng & Units 10/28/2020 04/20/2020 04/20/2020  WBC - 7.0 4.9 4.9  Hemoglobin 12.0 - 16.0 13.1 13.0 13.0  Hematocrit 36 - 46 38 39 246(A)  Platelets 150 - 399 254 - -   Lipid Panel     Component Value Date/Time   CHOL 160 04/22/2019 0000   TRIG 73 04/22/2019 0000   HDL 59 04/22/2019 0000   LDLCALC 87 04/22/2019 0000   HEMOGLOBIN A1C No results found for: HGBA1C, MPG TSH No results for input(s): TSH in the last 8760 hours.  Allergies   Allergies  Allergen Reactions   Erythromycin Nausea And Vomiting   Flecainide Acetate     Postural hypotension   Penicillins     Medications Prior to Visit:   Outpatient Medications Prior to Visit  Medication Sig Dispense Refill    acetaminophen (TYLENOL) 500 MG tablet Take 1,000 mg by mouth 3 (three) times daily as needed.     cholecalciferol (VITAMIN D) 1000 UNITS tablet Take 1,000 Units by mouth daily.     cycloSPORINE (RESTASIS) 0.05 % ophthalmic emulsion Place 1 drop into both eyes 3 (three) times daily.     furosemide (LASIX) 20 MG tablet TAKE 1 TABLET ONCE DAILY AS NEEDED FOR SWELLING- TAKE 1/2 TABLET FOR LEG SWELLING AS NEEDED. 60 tablet 0   Multiple Vitamins-Minerals (PRESERVISION AREDS PO) Take 2 tablets by mouth daily.      rosuvastatin (CRESTOR)  20 MG tablet TAKE ONE TABLET BY MOUTH ONCE DAILY 90 tablet 0   XARELTO 20 MG TABS tablet TAKE 1 TABLET ONCE DAILY WITH DINNER. 90 tablet 1   amiodarone (PACERONE) 200 MG tablet Take 400 mg (2 tablets) twice daily for 1 week. Then take 200 mg (1 tablet) twice daily. 90 tablet 3   diltiazem (CARDIZEM) 120 MG tablet Take 120 mg by mouth every morning.     No facility-administered medications prior to visit.   Final Medications at End of Visit    Current Meds  Medication Sig   acetaminophen (TYLENOL) 500 MG tablet Take 1,000 mg by mouth 3 (three) times daily as needed.   cholecalciferol (VITAMIN D) 1000 UNITS tablet Take 1,000 Units by mouth daily.   cycloSPORINE (RESTASIS) 0.05 % ophthalmic emulsion Place 1 drop into both eyes 3 (three) times daily.   furosemide (LASIX) 20 MG tablet TAKE 1 TABLET ONCE DAILY AS NEEDED FOR SWELLING- TAKE 1/2 TABLET FOR LEG SWELLING AS NEEDED.   Multiple Vitamins-Minerals (PRESERVISION AREDS PO) Take 2 tablets by mouth daily.    rosuvastatin (CRESTOR) 20 MG tablet TAKE ONE TABLET BY MOUTH ONCE DAILY   XARELTO 20 MG TABS tablet TAKE 1 TABLET ONCE DAILY WITH DINNER.   [DISCONTINUED] amiodarone (PACERONE) 200 MG tablet Take 400 mg (2 tablets) twice daily for 1 week. Then take 200 mg (1 tablet) twice daily.   [DISCONTINUED] diltiazem (CARDIZEM) 60 MG tablet Take 1 tablet (60 mg total) by mouth 3 (three) times daily as needed (For heart rate  >110 bpm).    Radiology:  No results found.  Cardiac Studies:   Echo- 07/13/2015 1. Left ventricle cavity is normal in size. Mild concentric hypertrophy of the left ventricle. Normal global wall motion. Calculated EF 66%. 2. Left atrial cavity is mildly dilated. 3. Trace aortic regurgitation. 4. Mild mitral regurgitation. 5. Mild tricuspid regurgitation. No evidence of pulmonary hypertension.  Carotid artery duplex 10/14/2020: Minimal stenosis in the right internal carotid artery (minimal). Stenosis in the right external carotid artery (<50%). Stenosis in the left internal carotid artery (16-49%). Stenosis in the left external carotid artery (<50%). Antegrade right vertebral artery flow. Antegrade left vertebral artery flow. Compared to 05/03/2020, left ICA stenosis of 50-69% is now 16-49%.  Upper limit of the spectrum.  Overall, no significant change. Follow up in one year is appropriate if clinically indicated.  EKG:   07/25/2021: Sinus rhythm at a rate of 67 bpm.  Normal axis.  Nonspecific T wave abnormality.  Poor R wave progression, cannot exclude anteroseptal infarct old.  Compared to EKG 05/19/2021, PR WP is new.  07/21/2021: Atypical atrial flutter with 2-1 conduction at a rate of 140 bpm.  EKG 05/19/2021: Sinus bradycardia at rate of 56 bpm, left atrial enlargement, otherwise normal EKG.  Compared to 11/11/2020, no significant change.  EKG 09/02/18/20: Probably ectopic atrial rhythm at the rate of 99 bpm with borderline first-degree AV block.  Normal axis.  Nonspecific T abnormality.   Assessment     ICD-10-CM   1. Atypical atrial flutter (HCC)  I48.4 EKG 12-Lead    2. Other specified hypotension  I95.89       Medications Discontinued During This Encounter  Medication Reason   diltiazem (CARDIZEM) 120 MG tablet    amiodarone (PACERONE) 200 MG tablet     Meds ordered this encounter  Medications   DISCONTD: amiodarone (PACERONE) 200 MG tablet    Sig: Take 1 tablet (200  mg total) by mouth 2 (  two) times daily.    Dispense:  90 tablet    Refill:  3   DISCONTD: diltiazem (CARDIZEM) 60 MG tablet    Sig: Take 1 tablet (60 mg total) by mouth 3 (three) times daily as needed (For heart rate >110 bpm).    Dispense:  30 tablet    Refill:  3   Recommendations:    [name]  is a 87 y.o. Caucasian female with history of paroxysmal atrial fibrillation, hypertension and hyperlipidemia.    Patient presents for 4-day follow-up.  She was seen in our office last week and atypical atrial flutter at a rate of 140 bpm and hypotensive.  Her last office visit started patient on amiodarone and discontinued medications contributing to hypotension including diltiazem and metoprolol.  Patient has been scheduled for cardioversion, however she presented and had spontaneously converted to sinus rhythm.  Patient is presently in normal sinus rhythm and blood pressure has improved.  We will reduce amiodarone to 200 mg p.o. twice daily and switch her to short acting diltiazem as needed for heart rate >110 bpm.  Patient's medications are now managed by [facility] facility, will update them accordingly.  Patient's symptoms have essentially resolved now that she is back in normal rhythm and blood pressure has improved.  Follow-up in 3 weeks, sooner if needed, for paroxysmal atrial fibrillation/flutter.   Alethia Berthold, PA-C 07/25/2021, 12:57 PM Office: [phone]

## 2021-07-25 NOTE — Progress Notes (Signed)
Provider:  Dr.Latice Waitman Location:   [place] Room Number: 152-A Place of Service:  SNF (31)  PCP: [name], MD Patient Care Team: [name], MD as PCP - General (Internal Medicine) [facility], [place] Donovan, NP as Nurse Practitioner (Geriatric Medicine) Adrian Prows, MD as Consulting Physician (Cardiology) Earlean Polka, MD as Consulting Physician (Ophthalmology)  Extended Emergency Contact Information Primary Emergency Contact: [name] of [place] Phone: [phone] Work Phone: [phone] Relation: Son Secondary Emergency Contact: [facility] Mobile Phone: [phone] Relation: Friend  Code Status: DNR Goals of Care: Advanced Directive information Advanced Directives 07/25/2021  Does Patient Have a Medical Advance Directive? Yes  Type of Advance Directive Living will;Out of facility DNR (pink MOST or yellow form)  Does patient want to make changes to medical advance directive? No - Patient declined  Copy of [place] in Chart? -  Pre-existing out of facility DNR order (yellow form or pink MOST form) -      Chief Complaint  Patient presents with   New Admit To SNF    New Admission.    HPI: Patient is a 87 y.o. female seen today for admission to Rehab   Patient has a history of PAF, HLD, chronic venous insufficiency, left carotid stenosis and hypertension  Patient was started on hydralazine by cardiology for her blood pressure. She came to the facility nurse complaining of weakness.  Was found to have a blood pressure of 80/70.  Her hydralazine was stopped.  Was seen in [facility] office where Dr. Sabra Heck found her to be tachycardic.  Was sent to cardiology.  EKG showed she was in rapid Atypical Flutter with Low BP.unable to admit due to no beds.   Started loaded on amiodarone Her lasix was also made PRN  Today she is feeling much better. Was seen by  Cardiology EKG showed Sinus Rhythm at 67  BP better She is walking with her walker. Does feel little unstable when she stands up  Patient was also found to have hematoma in her head. With Bruise going around her Neck She says she must have hit her head on the table. Does not look like she fell. Happened possibly around Wed few days ago Also Had blood in her Ear. Possible Q tip use  Lives in [place] Was not using walker before. But now she is  Son in the room thinks she was getting confused about her meds  Past Medical History:  Diagnosis Date   Actinic keratosis 09/23/2012   Anal fissure and fistula 07/28/2008   Arthralgia of temporomandibular joint 12/30/2009   Atrial fibrillation (HCC)    Atrial tachycardia ([place])    s/p ablation 2008. [facility] (R free wall Atach)   Atrophy of vulva 09/23/2012   Blepharochalasis 03/01/2011   Cardiac dysrhythmia, unspecified 11/10/2009   Dermatophytosis of nail 02/28/2008   Dizziness and giddiness 04/03/2007   Dysphagia, unspecified(787.20) 01/17/2012   Edema 11/21/2006   External hemorrhoids without mention of complication 73/71/0626   HTN (hypertension)    Hyperlipidemia    Insomnia, unspecified 11/18/2004   Internal hemorrhoids without mention of complication 9/48/5462   Left bundle branch hemiblock    Loss of weight 03/05/2012   Myalgia and myositis, unspecified 01/05/2009   Orthostatic hypotension 03/22/2007   Other malaise and fatigue 03/05/2012   Pain in joint, site unspecified 02/28/2008   Palpitations 11/21/2006   Personality change due to conditions classified elsewhere    Pterygium eye,  right 06/07/2020   Rectocele 09/23/2012   Restless legs syndrome (RLS)    Rosacea 09/23/2012   Sebaceous cyst 03/01/2011   Thumb pain 11/20/2012   Unspecified hereditary and idiopathic peripheral neuropathy    Past Surgical History:  Procedure Laterality Date   BLADDER SUSPENSION  1998   CATARACT EXTRACTION W/ INTRAOCULAR LENS IMPLANT Right 4/8//2008   Dr. Charise Killian   catheter  ablation  2008   for atrial tachycardia   CYST EXCISION  1998   mucus cyst removal from finger   PTERYGIUM EXCISION  06/07/2020   TOTAL ABDOMINAL HYSTERECTOMY  2001   uterine bleeding    reports that she has never smoked. She has never used smokeless tobacco. She reports current alcohol use. She reports that she does not use drugs. Social History   Socioeconomic History   Marital status: Widowed    Spouse name: Not on file   Number of children: 3   Years of education: Not on file   Highest education level: Not on file  Occupational History   Occupation: Retired Product manager: [employer]  Tobacco Use   Smoking status: Never   Smokeless tobacco: Never   Tobacco comments:    tobacco  - none  Vaping Use   Vaping Use: Never used  Substance and Sexual Activity   Alcohol use: Yes    Comment: 1-2 glasses of wine/gin daily    Drug use: No   Sexual activity: Never  Other Topics Concern   Not on file  Social History Narrative   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    POA, DNR   Married -husband moved to skill unit 07/2014   Water aerobic, ACES for exercise, walking   Wine 1 glass nightly   Never smoked   Social Determinants of Health   Financial Resource Strain: Not on file  Food Insecurity: Not on file  Transportation Needs: Not on file  Physical Activity: Not on file  Stress: Not on file  Social Connections: Not on file  Intimate Partner Violence: Not on file    Functional Status Survey:    Family History  Problem Relation Age of Onset   Heart disease Mother    Heart disease Father    Cancer Sister        breast    Health Maintenance  Topic Date Due   INFLUENZA VACCINE  03/14/2021   TETANUS/TDAP  07/14/2025   Pneumonia Vaccine 37+ Years old  Completed   DEXA SCAN  Completed   COVID-19 Vaccine  Completed   Zoster Vaccines- Shingrix  Completed   HPV VACCINES  Aged Out    Allergies  Allergen Reactions   Erythromycin Nausea And  Vomiting   Flecainide Acetate     Postural hypotension   Penicillins     Allergies as of 07/25/2021       Reactions   Erythromycin Nausea And Vomiting   Flecainide Acetate    Postural hypotension   Penicillins         Medication List        Accurate as of July 25, 2021 10:04 AM. If you have any questions, ask your nurse or doctor.          STOP taking these medications    Fluad Quadrivalent 0.5 ML injection Generic drug: influenza vaccine adjuvanted Stopped by: [name], MD   ZICAM COLD REMEDY PO Stopped by: [name], MD       TAKE  these medications    acetaminophen 500 MG tablet Commonly known as: TYLENOL Take 1,000 mg by mouth 3 (three) times daily as needed.   amiodarone 200 MG tablet Commonly known as: PACERONE Take 400 mg (2 tablets) twice daily for 1 week. Then take 200 mg (1 tablet) twice daily.   cholecalciferol 1000 units tablet Commonly known as: VITAMIN D Take 1,000 Units by mouth daily.   cycloSPORINE 0.05 % ophthalmic emulsion Commonly known as: RESTASIS Place 1 drop into both eyes 3 (three) times daily.   diltiazem 120 MG tablet Commonly known as: CARDIZEM Take 120 mg by mouth every morning.   furosemide 20 MG tablet Commonly known as: LASIX TAKE 1 TABLET ONCE DAILY AS NEEDED FOR SWELLING- TAKE 1/2 TABLET FOR LEG SWELLING AS NEEDED.   PRESERVISION AREDS PO Take 2 tablets by mouth daily.   rosuvastatin 20 MG tablet Commonly known as: CRESTOR TAKE ONE TABLET BY MOUTH ONCE DAILY   Xarelto 20 MG Tabs tablet Generic drug: rivaroxaban TAKE 1 TABLET ONCE DAILY WITH DINNER.        Review of Systems  Constitutional:  Positive for activity change. Negative for appetite change.  HENT: Negative.    Respiratory:  Negative for cough and shortness of breath.   Cardiovascular:  Negative for leg swelling.  Gastrointestinal:  Negative for constipation.  Genitourinary: Negative.   Musculoskeletal:  Positive for gait  problem. Negative for arthralgias and myalgias.  Skin:  Positive for wound.  Neurological:  Positive for weakness. Negative for dizziness.  Psychiatric/Behavioral:  Positive for confusion. Negative for dysphoric mood and sleep disturbance.    Vitals:   07/25/21 0954  BP: (!) 152/74  Pulse: 66  Resp: 18  Temp: (!) 97.3 F (36.3 C)  SpO2: 98%  Height: 5\' 5"  (1.651 m)   Body mass index is 22.47 kg/m. Physical Exam Vitals reviewed.  Constitutional:      Appearance: Normal appearance.  HENT:     Head: Normocephalic.     Comments: Has Hematoma in Right Parietal area with Bruise going down her Neck    Ears:     Comments: Right Ear had Blood Clotted in the entrance Was unable to Remove it I could see Ear canal which looked red. Was unable to see TM    Nose: Nose normal.     Mouth/Throat:     Mouth: Mucous membranes are moist.     Pharynx: Oropharynx is clear.  Eyes:     Pupils: Pupils are equal, round, and reactive to light.  Cardiovascular:     Rate and Rhythm: Normal rate and regular rhythm.     Pulses: Normal pulses.     Heart sounds: Normal heart sounds. No murmur heard. Pulmonary:     Effort: Pulmonary effort is normal.     Breath sounds: Normal breath sounds.  Abdominal:     General: Abdomen is flat. Bowel sounds are normal.     Palpations: Abdomen is soft.  Musculoskeletal:        General: No swelling.     Cervical back: Neck supple.  Skin:    General: Skin is warm.  Neurological:     General: No focal deficit present.     Mental Status: She is alert and oriented to person, place, and time.     Comments: Stood up with her walker  Psychiatric:        Mood and Affect: Mood normal.        Thought Content: Thought content normal.  Labs reviewed: Basic Metabolic Panel: Recent Labs    10/28/20 0000 07/20/21 0929  NA 140 141  K 3.6 3.4*  CL 104 99  CO2 23* 28  GLUCOSE  --  119*  BUN 26* 34*  CREATININE 0.7 1.34*  CALCIUM 9.3 9.3   Liver Function  Tests: No results for input(s): AST, ALT, ALKPHOS, BILITOT, PROT, ALBUMIN in the last 8760 hours. No results for input(s): LIPASE, AMYLASE in the last 8760 hours. No results for input(s): AMMONIA in the last 8760 hours. CBC: Recent Labs    10/28/20 0000  WBC 7.0  HGB 13.1  HCT 38  PLT 254   Cardiac Enzymes: No results for input(s): CKTOTAL, CKMB, CKMBINDEX, TROPONINI in the last 8760 hours. BNP: Invalid input(s): POCBNP No results found for: HGBA1C Lab Results  Component Value Date   TSH 3.19 04/20/2020   TSH 3.19 04/20/2020   No results found for: VITAMINB12 No results found for: FOLATE No results found for: IRON, TIBC, FERRITIN  Imaging and Procedures obtained prior to SNF admission: No results found.  Assessment/Plan 1. Atypical atrial flutter (HCC) Now Amiodarone In Regular rhythm Folow with Cardiology On Prn Diltiazem Off Lopressor  Also on Xarelto  2. Unsteady gait when walking Working with therapy Has started using Walker  3. Hematoma of her head Discussed with son Since doing well will wait to do CT scan at this time Will consider if any worsening  4. Otorrhagia of right ear/ Otitis Externa ? Etiology Will do Cipro Ear drops Reval if not better Referal to ENT if needed  5. Venous insufficiency of both lower extremities Was on Lasix before now PRN   6. Pure hypercholesterolemia On Crestor  7. Essential hypertension BP better now Passive hypertension due her age   63. Hypokalemia Was Normal in labs today  BUN was 13 creat 0.9 K 4.3  Family/ staff Communication:   Labs/tests ordered:

## 2021-08-01 ENCOUNTER — Encounter: Payer: Self-pay | Admitting: Internal Medicine

## 2021-08-01 ENCOUNTER — Non-Acute Institutional Stay (SKILLED_NURSING_FACILITY): Payer: Medicare PPO | Admitting: Internal Medicine

## 2021-08-01 DIAGNOSIS — G3184 Mild cognitive impairment, so stated: Secondary | ICD-10-CM

## 2021-08-01 DIAGNOSIS — H9221 Otorrhagia, right ear: Secondary | ICD-10-CM | POA: Diagnosis not present

## 2021-08-01 DIAGNOSIS — I484 Atypical atrial flutter: Secondary | ICD-10-CM

## 2021-08-01 DIAGNOSIS — I10 Essential (primary) hypertension: Secondary | ICD-10-CM | POA: Diagnosis not present

## 2021-08-01 NOTE — Progress Notes (Signed)
Location:   [place] Room Number: 361 Place of Service:  SNF [phone])  Provider: Veleta Miners MD  PCP: [name], MD Patient Care Team: [name], MD as PCP - General (Internal Medicine) Community, Well Amaryllis Dyke, Argentina Donovan, NP as Nurse Practitioner (Geriatric Medicine) Adrian Prows, MD as Consulting Physician (Cardiology) Earlean Polka, MD as Consulting Physician (Ophthalmology)  Extended Emergency Contact Information Primary Emergency Contact: [name] of [place] Phone: [phone] Work Phone: [phone] Relation: Son Secondary Emergency Contact: [facility] Mobile Phone: [phone] Relation: Friend  Code Status: DNR Goals of care:  Advanced Directive information Advanced Directives 08/01/2021  Does Patient Have a Medical Advance Directive? Yes  Type of Advance Directive Living will;Out of facility DNR (pink MOST or yellow form)  Does patient want to make changes to medical advance directive? No - Patient declined  Copy of [place] in Chart? -  Pre-existing out of facility DNR order (yellow form or pink MOST form) -     Allergies  Allergen Reactions   Erythromycin Nausea And Vomiting   Flecainide Acetate     Postural hypotension   Penicillins     Chief Complaint  Patient presents with   Acute Visit        HPI:  87 y.o. female  For number of Concerns  Patient has a history of PAF, HLD, chronic venous insufficiency, left carotid stenosis and hypertension   Patient was started on hydralazine by cardiology for her blood pressure. She came to the facility nurse complaining of weakness.  Was found to have a blood pressure of 80/70.  Her hydralazine was stopped.  Was seen in [facility] office where Dr. Sabra Heck found her to be tachycardic.  Was sent to cardiology.  EKG showed she was in rapid Atypical Flutter with Low BP.unable to admit due to no beds.   Started  loaded on amiodarone Her lasix was also made PRN Patient was also found to have hematoma in her head. With Bruise going around her Neck She says she must have hit her head on the table. Does not look like she fell. Happened possibly around Wed few days ago Also Had blood in her Ear. Possible Q tip use    Is doing well in Rehab Can walk with her walker No Dizziness.  Only issue is Cognition and speech is working with her Also her BP is running high. SBP more then 150 -180 HR 56-80 In regular rhythm   Past Medical History:  Diagnosis Date   Actinic keratosis 09/23/2012   Anal fissure and fistula 07/28/2008   Arthralgia of temporomandibular joint 12/30/2009   Atrial fibrillation (HCC)    Atrial tachycardia ([place])    s/p ablation 2008. [facility] (R free wall Atach)   Atrophy of vulva 09/23/2012   Blepharochalasis 03/01/2011   Cardiac dysrhythmia, unspecified 11/10/2009   Dermatophytosis of nail 02/28/2008   Dizziness and giddiness 04/03/2007   Dysphagia, unspecified(787.20) 01/17/2012   Edema 11/21/2006   External hemorrhoids without mention of complication 82/50/5397   HTN (hypertension)    Hyperlipidemia    Insomnia, unspecified 11/18/2004   Internal hemorrhoids without mention of complication 6/73/4193   Left bundle branch hemiblock    Loss of weight 03/05/2012   Myalgia and myositis, unspecified 01/05/2009   Orthostatic hypotension 03/22/2007   Other malaise and fatigue 03/05/2012   Pain in joint, site unspecified 02/28/2008   Palpitations 11/21/2006   Personality change due to conditions classified elsewhere  Pterygium eye, right 06/07/2020   Rectocele 09/23/2012   Restless legs syndrome (RLS)    Rosacea 09/23/2012   Sebaceous cyst 03/01/2011   Thumb pain 11/20/2012   Unspecified hereditary and idiopathic peripheral neuropathy     Past Surgical History:  Procedure Laterality Date   BLADDER SUSPENSION  1998   CATARACT EXTRACTION W/ INTRAOCULAR LENS IMPLANT Right 4/8//2008   Dr. Charise Killian    catheter ablation  2008   for atrial tachycardia   CYST EXCISION  1998   mucus cyst removal from finger   PTERYGIUM EXCISION  06/07/2020   TOTAL ABDOMINAL HYSTERECTOMY  2001   uterine bleeding      reports that she has never smoked. She has never used smokeless tobacco. She reports current alcohol use. She reports that she does not use drugs. Social History   Socioeconomic History   Marital status: Widowed    Spouse name: Not on file   Number of children: 3   Years of education: Not on file   Highest education level: Not on file  Occupational History   Occupation: Retired Product manager: [employer]  Tobacco Use   Smoking status: Never   Smokeless tobacco: Never   Tobacco comments:    tobacco  - none  Vaping Use   Vaping Use: Never used  Substance and Sexual Activity   Alcohol use: Yes    Comment: 1-2 glasses of wine/gin daily    Drug use: No   Sexual activity: Never  Other Topics Concern   Not on file  Social History Narrative   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    POA, DNR   Married -husband moved to skill unit 07/2014   Water aerobic, ACES for exercise, walking   Wine 1 glass nightly   Never smoked   Social Determinants of Radio broadcast assistant Strain: Not on file  Food Insecurity: Not on file  Transportation Needs: Not on file  Physical Activity: Not on file  Stress: Not on file  Social Connections: Not on file  Intimate Partner Violence: Not on file   Functional Status Survey:    Allergies  Allergen Reactions   Erythromycin Nausea And Vomiting   Flecainide Acetate     Postural hypotension   Penicillins     Pertinent  Health Maintenance Due  Topic Date Due   INFLUENZA VACCINE  03/14/2021   DEXA SCAN  Completed    Medications: Allergies as of 08/01/2021       Reactions   Erythromycin Nausea And Vomiting   Flecainide Acetate    Postural hypotension   Penicillins         Medication List         Accurate as of August 01, 2021  2:45 PM. If you have any questions, ask your nurse or doctor.          STOP taking these medications    amiodarone 200 MG tablet Commonly known as: PACERONE Stopped by: [name], MD       TAKE these medications    acetaminophen 500 MG tablet Commonly known as: TYLENOL Take 1,000 mg by mouth 3 (three) times daily as needed.   cholecalciferol 1000 units tablet Commonly known as: VITAMIN D Take 1,000 Units by mouth daily.   ciprofloxacin-hydrocortisone OTIC suspension Commonly known as: CIPRO HC OTIC Place 3 drops into the right ear 3 (three) times daily.   cycloSPORINE 0.05 % ophthalmic emulsion Commonly known as: RESTASIS  Place 1 drop into both eyes 3 (three) times daily.   diltiazem 60 MG tablet Commonly known as: Cardizem Take 1 tablet (60 mg total) by mouth 3 (three) times daily as needed (For heart rate >110 bpm).   furosemide 20 MG tablet Commonly known as: LASIX TAKE 1 TABLET ONCE DAILY AS NEEDED FOR SWELLING- TAKE 1/2 TABLET FOR LEG SWELLING AS NEEDED.   hydrALAZINE 10 MG tablet Commonly known as: APRESOLINE Take 10 mg by mouth 3 (three) times daily as needed.   PRESERVISION AREDS PO Take 2 tablets by mouth daily.   rosuvastatin 20 MG tablet Commonly known as: CRESTOR TAKE ONE TABLET BY MOUTH ONCE DAILY   Xarelto 20 MG Tabs tablet Generic drug: rivaroxaban TAKE 1 TABLET ONCE DAILY WITH DINNER.        Review of Systems  Constitutional:  Negative for activity change and appetite change.  HENT: Negative.    Respiratory:  Negative for cough and shortness of breath.   Cardiovascular:  Negative for leg swelling.  Gastrointestinal:  Negative for constipation.  Genitourinary: Negative.   Musculoskeletal:  Positive for gait problem. Negative for arthralgias and myalgias.  Skin: Negative.   Neurological:  Negative for dizziness and weakness.  Psychiatric/Behavioral:  Positive for confusion. Negative for  dysphoric mood and sleep disturbance.    Vitals:   08/01/21 1428  BP: (!) 153/80  Pulse: 77  Resp: 16  Temp: 98.3 F (36.8 C)  SpO2: 97%  Weight: 135 lb (61.2 kg)  Height: 5\' 4"  (1.626 m)   Body mass index is 23.17 kg/m. Physical Exam Vitals reviewed.  Constitutional:      Appearance: Normal appearance.  HENT:     Head: Normocephalic.     Ears:     Comments: Right Ear still had small clot in Was unable ot see her TM    Nose: Nose normal.     Mouth/Throat:     Mouth: Mucous membranes are moist.     Pharynx: Oropharynx is clear.  Eyes:     Pupils: Pupils are equal, round, and reactive to light.  Cardiovascular:     Rate and Rhythm: Normal rate and regular rhythm.     Pulses: Normal pulses.     Heart sounds: Normal heart sounds. No murmur heard. Pulmonary:     Effort: Pulmonary effort is normal.     Breath sounds: Normal breath sounds.  Abdominal:     General: Abdomen is flat. Bowel sounds are normal.     Palpations: Abdomen is soft.  Musculoskeletal:     Cervical back: Neck supple.     Comments: Mild edema Bilateral with Chronic Venous changes  Skin:    General: Skin is warm.  Neurological:     General: No focal deficit present.     Mental Status: She is alert and oriented to person, place, and time.     Comments: Able to stand with No dizziness  Psychiatric:        Mood and Affect: Mood normal.        Thought Content: Thought content normal.    Labs reviewed: Basic Metabolic Panel: Recent Labs    10/28/20 0000 07/20/21 0929 07/25/21 0000  NA 140 141 143  K 3.6 3.4* 4.3  CL 104 99 104  CO2 23* 28 27*  GLUCOSE  --  119*  --   BUN 26* 34* 13  CREATININE 0.7 1.34* 0.7  CALCIUM 9.3 9.3 9.1   Liver Function Tests: No results for input(s): AST,  ALT, ALKPHOS, BILITOT, PROT, ALBUMIN in the last 8760 hours. No results for input(s): LIPASE, AMYLASE in the last 8760 hours. No results for input(s): AMMONIA in the last 8760 hours. CBC: Recent Labs     10/28/20 0000  WBC 7.0  HGB 13.1  HCT 38  PLT 254   Cardiac Enzymes: No results for input(s): CKTOTAL, CKMB, CKMBINDEX, TROPONINI in the last 8760 hours. BNP: Invalid input(s): POCBNP CBG: No results for input(s): GLUCAP in the last 8760 hours.  Procedures and Imaging Studies During Stay: No results found.  Assessment/Plan:   Atypical atrial flutter (HCC) Doing well on Amiodarone Also on Xarelto Follow up with Cardiology  Essential hypertension BP elevated Will restart Toprol XL 25 mg  Otorrhagia of right ear Referal ro ENT Hematoma of her head Discussed with son Since doing well will wait to do CT scan at this time Will consider if any worsening Venous insufficiency of both lower extremities Was on Lasix before now PRN     Pure hypercholesterolemia On Crestor Mild cognitive impairment Speech to follow at home  Should be safe to return to her apartment D/w The staff and Her son and Daughter She should be able to manage in her apartment Medications will be managed by Nurse  Will start her on Toprol if she can tolerate can go back to her apartmetn on Thurs  Total time spent in this patient care encounter was  45_  minutes; greater than 50% of the visit spent counseling patient and staff, reviewing records , Labs and coordinating care for problems addressed at this encounter.

## 2021-08-02 DIAGNOSIS — Z9181 History of falling: Secondary | ICD-10-CM | POA: Diagnosis not present

## 2021-08-02 DIAGNOSIS — R42 Dizziness and giddiness: Secondary | ICD-10-CM | POA: Diagnosis not present

## 2021-08-02 DIAGNOSIS — R41841 Cognitive communication deficit: Secondary | ICD-10-CM | POA: Diagnosis not present

## 2021-08-02 DIAGNOSIS — F070 Personality change due to known physiological condition: Secondary | ICD-10-CM | POA: Diagnosis not present

## 2021-08-03 DIAGNOSIS — Z9181 History of falling: Secondary | ICD-10-CM | POA: Diagnosis not present

## 2021-08-03 DIAGNOSIS — F070 Personality change due to known physiological condition: Secondary | ICD-10-CM | POA: Diagnosis not present

## 2021-08-03 DIAGNOSIS — R42 Dizziness and giddiness: Secondary | ICD-10-CM | POA: Diagnosis not present

## 2021-08-03 DIAGNOSIS — R41841 Cognitive communication deficit: Secondary | ICD-10-CM | POA: Diagnosis not present

## 2021-08-04 ENCOUNTER — Non-Acute Institutional Stay (SKILLED_NURSING_FACILITY): Payer: Medicare PPO | Admitting: Internal Medicine

## 2021-08-04 ENCOUNTER — Encounter: Payer: Self-pay | Admitting: Internal Medicine

## 2021-08-04 DIAGNOSIS — I484 Atypical atrial flutter: Secondary | ICD-10-CM | POA: Diagnosis not present

## 2021-08-04 DIAGNOSIS — R41841 Cognitive communication deficit: Secondary | ICD-10-CM | POA: Diagnosis not present

## 2021-08-04 DIAGNOSIS — R42 Dizziness and giddiness: Secondary | ICD-10-CM | POA: Diagnosis not present

## 2021-08-04 DIAGNOSIS — I10 Essential (primary) hypertension: Secondary | ICD-10-CM

## 2021-08-04 DIAGNOSIS — H9221 Otorrhagia, right ear: Secondary | ICD-10-CM

## 2021-08-04 DIAGNOSIS — F070 Personality change due to known physiological condition: Secondary | ICD-10-CM | POA: Diagnosis not present

## 2021-08-04 DIAGNOSIS — Z9181 History of falling: Secondary | ICD-10-CM | POA: Diagnosis not present

## 2021-08-04 NOTE — Progress Notes (Signed)
Location:   [place] Room Number: [place] of Service:  SNF [phone]) Provider:  Veleta Miners MD  [name], MD  Patient Care Team: [name], MD as PCP - General (Internal Medicine) Community, Well Amaryllis Dyke, Argentina Donovan, NP as Nurse Practitioner (Geriatric Medicine) Adrian Prows, MD as Consulting Physician (Cardiology) Earlean Polka, MD as Consulting Physician (Ophthalmology)  Extended Emergency Contact Information Primary Emergency Contact: [name] of [place] Phone: [phone] Work Phone: [phone] Relation: Son Secondary Emergency Contact: [facility] Mobile Phone: [phone] Relation: Friend  Code Status:  DNR Goals of care: Advanced Directive information Advanced Directives 08/04/2021  Does Patient Have a Medical Advance Directive? Yes  Type of Advance Directive Living will;Out of facility DNR (pink MOST or yellow form)  Does patient want to make changes to medical advance directive? No - Patient declined  Copy of [place] in Chart? -  Pre-existing out of facility DNR order (yellow form or pink MOST form) -     Chief Complaint  Patient presents with   Acute Visit    HPI:  Pt is a 87 y.o. female seen today for an acute visit for Follow up with Bradycardia  Patient has a history of PAF, HLD, chronic venous insufficiency, left carotid stenosis and hypertension   Patient was started on hydralazine by cardiology for her blood pressure. She came to the facility nurse complaining of weakness.  Was found to have a blood pressure of 80/70.     Started loaded on amiodarone Her lasix was also made PRN Patient was also found to have hematoma in her head. With Bruise going around her Neck She says she must have hit her head on the table. Does not look like she fell. Happened possibly around Wed few days ago Also Had blood in her Ear. Possible Q tip use  Patient  now is doing well but her Blood pressure has now started running high  I had started her back on Toprol 25 mg but now she is having bradycardia She also started feeling weak.     Past Medical History:  Diagnosis Date   Actinic keratosis 09/23/2012   Anal fissure and fistula 07/28/2008   Arthralgia of temporomandibular joint 12/30/2009   Atrial fibrillation (HCC)    Atrial tachycardia ([place])    s/p ablation 2008. [facility] (R free wall Atach)   Atrophy of vulva 09/23/2012   Blepharochalasis 03/01/2011   Cardiac dysrhythmia, unspecified 11/10/2009   Dermatophytosis of nail 02/28/2008   Dizziness and giddiness 04/03/2007   Dysphagia, unspecified(787.20) 01/17/2012   Edema 11/21/2006   External hemorrhoids without mention of complication 16/60/6301   HTN (hypertension)    Hyperlipidemia    Insomnia, unspecified 11/18/2004   Internal hemorrhoids without mention of complication 01/13/931   Left bundle branch hemiblock    Loss of weight 03/05/2012   Myalgia and myositis, unspecified 01/05/2009   Orthostatic hypotension 03/22/2007   Other malaise and fatigue 03/05/2012   Pain in joint, site unspecified 02/28/2008   Palpitations 11/21/2006   Personality change due to conditions classified elsewhere    Pterygium eye, right 06/07/2020   Rectocele 09/23/2012   Restless legs syndrome (RLS)    Rosacea 09/23/2012   Sebaceous cyst 03/01/2011   Thumb pain 11/20/2012   Unspecified hereditary and idiopathic peripheral neuropathy    Past Surgical History:  Procedure Laterality Date   BLADDER SUSPENSION  1998   CATARACT EXTRACTION W/ INTRAOCULAR LENS IMPLANT Right 4/8//2008  Dr. Charise Killian   catheter ablation  2008   for atrial tachycardia   CYST EXCISION  1998   mucus cyst removal from finger   PTERYGIUM EXCISION  06/07/2020   TOTAL ABDOMINAL HYSTERECTOMY  2001   uterine bleeding    Allergies  Allergen Reactions   Erythromycin Nausea And Vomiting   Flecainide Acetate     Postural hypotension   Penicillins      Allergies as of 08/04/2021       Reactions   Erythromycin Nausea And Vomiting   Flecainide Acetate    Postural hypotension   Penicillins         Medication List        Accurate as of August 04, 2021  4:34 PM. If you have any questions, ask your nurse or doctor.          acetaminophen 500 MG tablet Commonly known as: TYLENOL Take 1,000 mg by mouth 3 (three) times daily as needed.   cholecalciferol 1000 units tablet Commonly known as: VITAMIN D Take 1,000 Units by mouth daily.   cycloSPORINE 0.05 % ophthalmic emulsion Commonly known as: RESTASIS Place 1 drop into both eyes 3 (three) times daily.   diltiazem 60 MG tablet Commonly known as: Cardizem Take 1 tablet (60 mg total) by mouth 3 (three) times daily as needed (For heart rate >110 bpm).   furosemide 20 MG tablet Commonly known as: LASIX TAKE 1 TABLET ONCE DAILY AS NEEDED FOR SWELLING- TAKE 1/2 TABLET FOR LEG SWELLING AS NEEDED.   hydrALAZINE 10 MG tablet Commonly known as: APRESOLINE Take 10 mg by mouth 3 (three) times daily as needed.   metoprolol succinate 25 MG 24 hr tablet Commonly known as: TOPROL-XL Take 25 mg by mouth daily.   PRESERVISION AREDS PO Take 2 tablets by mouth daily.   Robafen DM Cough 10-100 MG/5ML liquid Generic drug: dextromethorphan-guaiFENesin Take 10 mLs by mouth every 6 (six) hours as needed for cough.   rosuvastatin 20 MG tablet Commonly known as: CRESTOR TAKE ONE TABLET BY MOUTH ONCE DAILY   Xarelto 20 MG Tabs tablet Generic drug: rivaroxaban TAKE 1 TABLET ONCE DAILY WITH DINNER.        Review of Systems  Constitutional:  Positive for activity change. Negative for appetite change.  HENT: Negative.    Respiratory:  Negative for cough and shortness of breath.   Cardiovascular:  Negative for leg swelling.  Gastrointestinal:  Negative for constipation.  Genitourinary: Negative.   Musculoskeletal:  Positive for gait problem. Negative for arthralgias and  myalgias.  Skin: Negative.   Neurological:  Positive for weakness. Negative for dizziness.  Psychiatric/Behavioral:  Positive for confusion. Negative for dysphoric mood and sleep disturbance.    Immunization History  Administered Date(s) Administered   Influenza Whole 06/12/2012   Influenza, High Dose Seasonal PF 05/23/2019, 06/11/2020   Influenza,inj,Quad PF,6+ Mos 05/13/2013, 06/07/2018   Influenza-Unspecified 05/27/2014, 04/24/2015, 04/13/2016, 04/24/2017, 06/11/2020   Moderna Covid-19 Vaccine Bivalent Booster 19yrs & up 06/21/2021   Moderna SARS-COV2 Booster Vaccination 04/18/2021   Moderna Sars-Covid-2 Vaccination 08/26/2019, 09/23/2019, 06/29/2020   Pneumococcal Conjugate-13 05/27/2014   Pneumococcal Polysaccharide-23 08/14/1997   Td 04/15/2011   Tdap 07/15/2015   Zoster Recombinat (Shingrix) 02/12/2017, 06/28/2017   Zoster, Live 08/15/2007   Pertinent  Health Maintenance Due  Topic Date Due   INFLUENZA VACCINE  03/14/2021   DEXA SCAN  Completed   Fall Risk 05/05/2020 06/10/2020 11/08/2020 05/18/2021 07/20/2021  Falls in the past year? 0 0 0 0 1  Was there an injury with Fall? 0 0 0 - 0  Was there an injury with Fall? - - - - -  Fall Risk Category Calculator 0 0 0 - 1  Fall Risk Category Low Low Low - Low  Patient Fall Risk Level - Low fall risk Low fall risk Low fall risk Low fall risk  Patient at Risk for Falls Due to - - - - History of fall(s)  Fall risk Follow up - - Falls evaluation completed Falls evaluation completed Falls evaluation completed;Education provided;Falls prevention discussed   Functional Status Survey:    Vitals:   08/04/21 1623  BP: (!) 155/74  Pulse: (!) 55  Resp: 14  Temp: 99.1 F (37.3 C)  SpO2: 96%  Weight: 135 lb (61.2 kg)  Height: 5\' 4"  (1.626 m)   Body mass index is 23.17 kg/m. Physical Exam Vitals reviewed.  Constitutional:      Appearance: Normal appearance.  HENT:     Head: Normocephalic.     Nose: Nose normal.      Mouth/Throat:     Mouth: Mucous membranes are moist.     Pharynx: Oropharynx is clear.  Eyes:     Pupils: Pupils are equal, round, and reactive to light.  Cardiovascular:     Rate and Rhythm: Normal rate and regular rhythm.     Pulses: Normal pulses.     Heart sounds: Normal heart sounds. No murmur heard. Pulmonary:     Effort: Pulmonary effort is normal.     Breath sounds: Normal breath sounds.  Abdominal:     General: Abdomen is flat. Bowel sounds are normal.     Palpations: Abdomen is soft.  Musculoskeletal:        General: No swelling.     Cervical back: Neck supple.  Skin:    General: Skin is warm.     Comments: Has hematoma in her head and Bruise in her Neck  Neurological:     General: No focal deficit present.     Mental Status: She is alert and oriented to person, place, and time.  Psychiatric:        Mood and Affect: Mood normal.        Thought Content: Thought content normal.    Labs reviewed: Recent Labs    10/28/20 0000 07/20/21 0929 07/25/21 0000  NA 140 141 143  K 3.6 3.4* 4.3  CL 104 99 104  CO2 23* 28 27*  GLUCOSE  --  119*  --   BUN 26* 34* 13  CREATININE 0.7 1.34* 0.7  CALCIUM 9.3 9.3 9.1   No results for input(s): AST, ALT, ALKPHOS, BILITOT, PROT, ALBUMIN in the last 8760 hours. Recent Labs    10/28/20 0000  WBC 7.0  HGB 13.1  HCT 38  PLT 254   Lab Results  Component Value Date   TSH 3.19 04/20/2020   TSH 3.19 04/20/2020   No results found for: HGBA1C Lab Results  Component Value Date   CHOL 160 04/22/2019   HDL 59 04/22/2019   LDLCALC 87 04/22/2019   TRIG 73 04/22/2019    Significant Diagnostic Results in last 30 days:  No results found.  Assessment/Plan  Essential hypertension Toprol was making her Bradycardic Will start her on Norvasc 2.5 mg and increase as needed Atypical atrial flutter (HCC) In Regular rhythm Doing well on Amiodarone Also on Xarelto Follow up with Cardiology Otorrhagia of right ear Referal ro  ENT Hematoma of her head Discussed with son  Since doing well will wait to do CT scan at this time Will consider if any worsening Venous insufficiency of both lower extremities Was on Lasix before now PRN     Pure hypercholesterolemia On Crestor Mild cognitive impairment Speech to follow at home  Should be safe to return to her apartment D/w The staff and Her son and Daughter She should be able to manage in her apartment Medications will be managed by Nurse    Family/ staff Communication:   Labs/tests ordered:

## 2021-08-05 DIAGNOSIS — R41841 Cognitive communication deficit: Secondary | ICD-10-CM | POA: Diagnosis not present

## 2021-08-05 DIAGNOSIS — F070 Personality change due to known physiological condition: Secondary | ICD-10-CM | POA: Diagnosis not present

## 2021-08-05 DIAGNOSIS — R42 Dizziness and giddiness: Secondary | ICD-10-CM | POA: Diagnosis not present

## 2021-08-05 DIAGNOSIS — Z9181 History of falling: Secondary | ICD-10-CM | POA: Diagnosis not present

## 2021-08-11 DIAGNOSIS — R42 Dizziness and giddiness: Secondary | ICD-10-CM | POA: Diagnosis not present

## 2021-08-11 DIAGNOSIS — F070 Personality change due to known physiological condition: Secondary | ICD-10-CM | POA: Diagnosis not present

## 2021-08-11 DIAGNOSIS — R41841 Cognitive communication deficit: Secondary | ICD-10-CM | POA: Diagnosis not present

## 2021-08-11 DIAGNOSIS — Z9181 History of falling: Secondary | ICD-10-CM | POA: Diagnosis not present

## 2021-08-12 DIAGNOSIS — R2681 Unsteadiness on feet: Secondary | ICD-10-CM | POA: Diagnosis not present

## 2021-08-12 DIAGNOSIS — Z9181 History of falling: Secondary | ICD-10-CM | POA: Diagnosis not present

## 2021-08-12 DIAGNOSIS — M6389 Disorders of muscle in diseases classified elsewhere, multiple sites: Secondary | ICD-10-CM | POA: Diagnosis not present

## 2021-08-12 DIAGNOSIS — F070 Personality change due to known physiological condition: Secondary | ICD-10-CM | POA: Diagnosis not present

## 2021-08-12 DIAGNOSIS — R278 Other lack of coordination: Secondary | ICD-10-CM | POA: Diagnosis not present

## 2021-08-12 DIAGNOSIS — R42 Dizziness and giddiness: Secondary | ICD-10-CM | POA: Diagnosis not present

## 2021-08-12 DIAGNOSIS — R41841 Cognitive communication deficit: Secondary | ICD-10-CM | POA: Diagnosis not present

## 2021-08-15 DIAGNOSIS — Z9181 History of falling: Secondary | ICD-10-CM | POA: Diagnosis not present

## 2021-08-15 DIAGNOSIS — F070 Personality change due to known physiological condition: Secondary | ICD-10-CM | POA: Diagnosis not present

## 2021-08-15 DIAGNOSIS — R41841 Cognitive communication deficit: Secondary | ICD-10-CM | POA: Diagnosis not present

## 2021-08-15 DIAGNOSIS — R42 Dizziness and giddiness: Secondary | ICD-10-CM | POA: Diagnosis not present

## 2021-08-16 ENCOUNTER — Other Ambulatory Visit: Payer: Self-pay

## 2021-08-16 ENCOUNTER — Ambulatory Visit: Payer: Medicare PPO | Admitting: Student

## 2021-08-16 ENCOUNTER — Encounter: Payer: Self-pay | Admitting: Student

## 2021-08-16 VITALS — BP 136/74 | HR 71 | Temp 98.0°F | Ht 64.0 in | Wt 134.0 lb

## 2021-08-16 DIAGNOSIS — R42 Dizziness and giddiness: Secondary | ICD-10-CM | POA: Diagnosis not present

## 2021-08-16 DIAGNOSIS — R41841 Cognitive communication deficit: Secondary | ICD-10-CM | POA: Diagnosis not present

## 2021-08-16 DIAGNOSIS — F070 Personality change due to known physiological condition: Secondary | ICD-10-CM | POA: Diagnosis not present

## 2021-08-16 DIAGNOSIS — Z9181 History of falling: Secondary | ICD-10-CM | POA: Diagnosis not present

## 2021-08-16 DIAGNOSIS — I480 Paroxysmal atrial fibrillation: Secondary | ICD-10-CM | POA: Diagnosis not present

## 2021-08-16 NOTE — Progress Notes (Signed)
Primary Physician/Referring:  [name], MD  Patient ID: Rebekah Graham, female    DOB: [birth year], 88 y.o.   MRN: [id number]  Chief Complaint  Patient presents with   Atrial Fibrillation   Follow-up   HPI:     Rebekah Graham  is a 88 y.o. Caucasian female with history of paroxysmal atrial fibrillation, hypertension and hyperlipidemia.    Patient presents for 3-week follow-up of atrial fibrillation/flutter.  At last office visit reduced amiodarone to 200 mg p.o. twice daily.  Since last office visit she has been seen by PCP.  Patient blood pressure was elevated on 08/01/2021, therefore PCP resumed metoprolol, however at follow-up visit on 08/04/2021 patient complained of fatigue and was bradycardic, therefore PCP again stopped metoprolol and patient's fatigue has improved.  She has had no known recurrence of atrial fibrillation/flutter since last office visit.  She continues to tolerate anticoagulation without bleeding diathesis.  Patient now lives in assisted living at [facility].  She is therefore accompanied by medical at today's visit.  Denies chest pain, palpitations, syncope, near syncope.  Denies orthopnea, PND, leg edema.  Dyspnea and dizziness have resolved.  She continues to tolerate Xarelto without bleeding diathesis.  Past Medical History:  Diagnosis Date   Actinic keratosis 09/23/2012   Anal fissure and fistula 07/28/2008   Arthralgia of temporomandibular joint 12/30/2009   Atrial fibrillation (HCC)    Atrial tachycardia ([place])    s/p ablation 2008. [facility] (R free wall Atach)   Atrophy of vulva 09/23/2012   Blepharochalasis 03/01/2011   Cardiac dysrhythmia, unspecified 11/10/2009   Dermatophytosis of nail 02/28/2008   Dizziness and giddiness 04/03/2007   Dysphagia, unspecified(787.20) 01/17/2012   Edema 11/21/2006   External hemorrhoids without mention of complication 16/55/3748   HTN (hypertension)    Hyperlipidemia    Insomnia, unspecified 11/18/2004   Internal  hemorrhoids without mention of complication 2/70/7867   Left bundle branch hemiblock    Loss of weight 03/05/2012   Myalgia and myositis, unspecified 01/05/2009   Orthostatic hypotension 03/22/2007   Other malaise and fatigue 03/05/2012   Pain in joint, site unspecified 02/28/2008   Palpitations 11/21/2006   Personality change due to conditions classified elsewhere    Pterygium eye, right 06/07/2020   Rectocele 09/23/2012   Restless legs syndrome (RLS)    Rosacea 09/23/2012   Sebaceous cyst 03/01/2011   Thumb pain 11/20/2012   Unspecified hereditary and idiopathic peripheral neuropathy    Past Surgical History:  Procedure Laterality Date   BLADDER SUSPENSION  1998   CATARACT EXTRACTION W/ INTRAOCULAR LENS IMPLANT Right 4/8//2008   Dr. Charise Killian   catheter ablation  2008   for atrial tachycardia   CYST EXCISION  1998   mucus cyst removal from finger   PTERYGIUM EXCISION  06/07/2020   TOTAL ABDOMINAL HYSTERECTOMY  2001   uterine bleeding   Social History   Tobacco Use   Smoking status: Never   Smokeless tobacco: Never   Tobacco comments:    tobacco  - none  Substance Use Topics   Alcohol use: Yes    Comment: 1-2 glasses of wine/gin daily     Family History  Problem Relation Age of Onset   Heart disease Mother    Heart disease Father    Cancer Sister        breast    ROS  Review of Systems  Constitutional: Negative for malaise/fatigue (resolved) and weight gain.  Cardiovascular:  Negative for chest pain, claudication, dyspnea on exertion (resolved), leg  swelling, near-syncope, orthopnea, palpitations, paroxysmal nocturnal dyspnea and syncope.  Skin:        Easy bruising  Musculoskeletal:  Positive for back pain and joint pain.  Gastrointestinal:  Negative for melena.  Neurological:  Negative for dizziness and weakness.  Objective  Blood pressure 136/74, pulse 71, temperature 98 F (36.7 C), temperature source Temporal, height 5\' 4"  (1.626 m), weight 134 lb (60.8 kg), SpO2 96 %.  Body mass index is 23 kg/m.    Physical Exam Vitals reviewed.  Cardiovascular:     Rate and Rhythm: Normal rate and regular rhythm.     Pulses: Intact distal pulses.     Heart sounds: S1 normal and S2 normal. No murmur heard.   No gallop.  Pulmonary:     Effort: Pulmonary effort is normal. No respiratory distress.     Breath sounds: No wheezing, rhonchi or rales.  Musculoskeletal:     Right lower leg: No edema.     Left lower leg: No edema.  Skin:    Findings: Bruising (right and left lateral aspects of patients neck) present.  Neurological:     Mental Status: [name]   Laboratory examination:   CMP Latest Ref Rng & Units 07/25/2021 07/20/2021 10/28/2020  Glucose 65 - 99 mg/dL - 119(H) -  BUN 4 - 21 13 34(H) 26(A)  Creatinine 0.5 - 1.1 0.7 1.34(H) 0.7  Sodium 137 - 147 143 141 140  Potassium 3.4 - 5.3 4.3 3.4(L) 3.6  Chloride 99 - 108 104 99 104  CO2 13 - 22 27(A) 28 23(A)  Calcium 8.7 - 10.7 9.1 9.3 9.3  Alkaline Phos 25 - 125 - - -  AST 13 - 35 - - -  ALT 7 - 35 - - -   CBC Latest Ref Rng & Units 10/28/2020 04/20/2020 04/20/2020  WBC - 7.0 4.9 4.9  Hemoglobin 12.0 - 16.0 13.1 13.0 13.0  Hematocrit 36 - 46 38 39 246(A)  Platelets 150 - 399 254 - -   Lipid Panel     Component Value Date/Time   CHOL 160 04/22/2019 0000   TRIG 73 04/22/2019 0000   HDL 59 04/22/2019 0000   LDLCALC 87 04/22/2019 0000   HEMOGLOBIN A1C No results found for: HGBA1C, MPG TSH No results for input(s): TSH in the last 8760 hours.  Allergies   Allergies  Allergen Reactions   Erythromycin Nausea And Vomiting   Flecainide Acetate     Postural hypotension   Penicillins     Medications Prior to Visit:   Outpatient Medications Prior to Visit  Medication Sig Dispense Refill   acetaminophen (TYLENOL) 500 MG tablet Take 1,000 mg by mouth 3 (three) times daily as needed.     amiodarone (PACERONE) 200 MG tablet Take 200 mg by mouth daily.     amLODipine (NORVASC) 2.5 MG tablet Take 2.5  mg by mouth daily.     cholecalciferol (VITAMIN D) 1000 UNITS tablet Take 1,000 Units by mouth daily.     cycloSPORINE (RESTASIS) 0.05 % ophthalmic emulsion Place 1 drop into both eyes 3 (three) times daily.     dextromethorphan-guaiFENesin (ROBAFEN DM COUGH) 10-100 MG/5ML liquid Take 10 mLs by mouth every 6 (six) hours as needed for cough.     diltiazem (CARDIZEM) 60 MG tablet Take 1 tablet (60 mg total) by mouth 3 (three) times daily as needed (For heart rate >110 bpm). 30 tablet 3   furosemide (LASIX) 20 MG tablet TAKE 1 TABLET ONCE DAILY  AS NEEDED FOR SWELLING- TAKE 1/2 TABLET FOR LEG SWELLING AS NEEDED. 60 tablet 0   hydrALAZINE (APRESOLINE) 10 MG tablet Take 10 mg by mouth 3 (three) times daily as needed.     Multiple Vitamins-Minerals (PRESERVISION AREDS PO) Take 2 tablets by mouth daily.      rosuvastatin (CRESTOR) 20 MG tablet TAKE ONE TABLET BY MOUTH ONCE DAILY 90 tablet 0   XARELTO 20 MG TABS tablet TAKE 1 TABLET ONCE DAILY WITH DINNER. 90 tablet 1   No facility-administered medications prior to visit.   Final Medications at End of Visit    Current Meds  Medication Sig   acetaminophen (TYLENOL) 500 MG tablet Take 1,000 mg by mouth 3 (three) times daily as needed.   amiodarone (PACERONE) 200 MG tablet Take 200 mg by mouth daily.   amLODipine (NORVASC) 2.5 MG tablet Take 2.5 mg by mouth daily.   cholecalciferol (VITAMIN D) 1000 UNITS tablet Take 1,000 Units by mouth daily.   cycloSPORINE (RESTASIS) 0.05 % ophthalmic emulsion Place 1 drop into both eyes 3 (three) times daily.   dextromethorphan-guaiFENesin (ROBAFEN DM COUGH) 10-100 MG/5ML liquid Take 10 mLs by mouth every 6 (six) hours as needed for cough.   diltiazem (CARDIZEM) 60 MG tablet Take 1 tablet (60 mg total) by mouth 3 (three) times daily as needed (For heart rate >110 bpm).   furosemide (LASIX) 20 MG tablet TAKE 1 TABLET ONCE DAILY AS NEEDED FOR SWELLING- TAKE 1/2 TABLET FOR LEG SWELLING AS NEEDED.   hydrALAZINE  (APRESOLINE) 10 MG tablet Take 10 mg by mouth 3 (three) times daily as needed.   Multiple Vitamins-Minerals (PRESERVISION AREDS PO) Take 2 tablets by mouth daily.    rosuvastatin (CRESTOR) 20 MG tablet TAKE ONE TABLET BY MOUTH ONCE DAILY   XARELTO 20 MG TABS tablet TAKE 1 TABLET ONCE DAILY WITH DINNER.    Radiology:  No results found.  Cardiac Studies:   Echo- 07/13/2015 1. Left ventricle cavity is normal in size. Mild concentric hypertrophy of the left ventricle. Normal global wall motion. Calculated EF 66%. 2. Left atrial cavity is mildly dilated. 3. Trace aortic regurgitation. 4. Mild mitral regurgitation. 5. Mild tricuspid regurgitation. No evidence of pulmonary hypertension.  Carotid artery duplex 10/14/2020: Minimal stenosis in the right internal carotid artery (minimal). Stenosis in the right external carotid artery (<50%). Stenosis in the left internal carotid artery (16-49%). Stenosis in the left external carotid artery (<50%). Antegrade right vertebral artery flow. Antegrade left vertebral artery flow. Compared to 05/03/2020, left ICA stenosis of 50-69% is now 16-49%.  Upper limit of the spectrum.  Overall, no significant change. Follow up in one year is appropriate if clinically indicated.  EKG:   07/25/2021: Sinus rhythm at a rate of 67 bpm.  Normal axis.  Nonspecific T wave abnormality.  Poor R wave progression, cannot exclude anteroseptal infarct old.  Compared to EKG 05/19/2021, PR WP is new.  07/21/2021: Atypical atrial flutter with 2-1 conduction at a rate of 140 bpm.  EKG 05/19/2021: Sinus bradycardia at rate of 56 bpm, left atrial enlargement, otherwise normal EKG.  Compared to 11/11/2020, no significant change.  EKG 09/02/18/20: Probably ectopic atrial rhythm at the rate of 99 bpm with borderline first-degree AV block.  Normal axis.  Nonspecific T abnormality.   Assessment     ICD-10-CM   1. Paroxysmal atrial fibrillation (HCC)  I48.0        There are no  discontinued medications.   No orders of the defined types were  placed in this encounter. This patients CHA2DS2-VASc Score 4 (HTN, Age, F)and yearly risk of stroke 4.8%.   Recommendations:    Rebekah Graham  is a 88 y.o. Caucasian female with history of paroxysmal atrial fibrillation, hypertension and hyperlipidemia.    Patient presents for 3-week follow-up.  She has had no known recurrence of atrial flutter and is feeling overall improved.  She is now living in assisted living, therefore she has assistance managing her medications.  Patient is presently taking amiodarone 200 mg twice daily.  We will switch her to 200 mg once daily of the amiodarone.  Patient's blood pressure is well controlled.  She is tolerating anticoagulation without bleeding diathesis.  Follow-up in 3 months, sooner if needed.   Rebekah Berthold, PA-C 08/16/2021, 11:44 AM Office: [phone]

## 2021-08-17 DIAGNOSIS — R41841 Cognitive communication deficit: Secondary | ICD-10-CM | POA: Diagnosis not present

## 2021-08-17 DIAGNOSIS — R42 Dizziness and giddiness: Secondary | ICD-10-CM | POA: Diagnosis not present

## 2021-08-17 DIAGNOSIS — R278 Other lack of coordination: Secondary | ICD-10-CM | POA: Diagnosis not present

## 2021-08-17 DIAGNOSIS — M6389 Disorders of muscle in diseases classified elsewhere, multiple sites: Secondary | ICD-10-CM | POA: Diagnosis not present

## 2021-08-17 DIAGNOSIS — R2681 Unsteadiness on feet: Secondary | ICD-10-CM | POA: Diagnosis not present

## 2021-08-17 DIAGNOSIS — Z9181 History of falling: Secondary | ICD-10-CM | POA: Diagnosis not present

## 2021-08-17 DIAGNOSIS — F070 Personality change due to known physiological condition: Secondary | ICD-10-CM | POA: Diagnosis not present

## 2021-08-18 DIAGNOSIS — R2681 Unsteadiness on feet: Secondary | ICD-10-CM | POA: Diagnosis not present

## 2021-08-18 DIAGNOSIS — R41841 Cognitive communication deficit: Secondary | ICD-10-CM | POA: Diagnosis not present

## 2021-08-18 DIAGNOSIS — R42 Dizziness and giddiness: Secondary | ICD-10-CM | POA: Diagnosis not present

## 2021-08-18 DIAGNOSIS — M6389 Disorders of muscle in diseases classified elsewhere, multiple sites: Secondary | ICD-10-CM | POA: Diagnosis not present

## 2021-08-18 DIAGNOSIS — R278 Other lack of coordination: Secondary | ICD-10-CM | POA: Diagnosis not present

## 2021-08-18 DIAGNOSIS — Z9181 History of falling: Secondary | ICD-10-CM | POA: Diagnosis not present

## 2021-08-18 DIAGNOSIS — F070 Personality change due to known physiological condition: Secondary | ICD-10-CM | POA: Diagnosis not present

## 2021-08-19 DIAGNOSIS — R2681 Unsteadiness on feet: Secondary | ICD-10-CM | POA: Diagnosis not present

## 2021-08-19 DIAGNOSIS — R278 Other lack of coordination: Secondary | ICD-10-CM | POA: Diagnosis not present

## 2021-08-19 DIAGNOSIS — F070 Personality change due to known physiological condition: Secondary | ICD-10-CM | POA: Diagnosis not present

## 2021-08-19 DIAGNOSIS — M6389 Disorders of muscle in diseases classified elsewhere, multiple sites: Secondary | ICD-10-CM | POA: Diagnosis not present

## 2021-08-19 DIAGNOSIS — R41841 Cognitive communication deficit: Secondary | ICD-10-CM | POA: Diagnosis not present

## 2021-08-19 DIAGNOSIS — R42 Dizziness and giddiness: Secondary | ICD-10-CM | POA: Diagnosis not present

## 2021-08-19 DIAGNOSIS — Z9181 History of falling: Secondary | ICD-10-CM | POA: Diagnosis not present

## 2021-08-22 DIAGNOSIS — F070 Personality change due to known physiological condition: Secondary | ICD-10-CM | POA: Diagnosis not present

## 2021-08-22 DIAGNOSIS — R41841 Cognitive communication deficit: Secondary | ICD-10-CM | POA: Diagnosis not present

## 2021-08-22 DIAGNOSIS — Z9181 History of falling: Secondary | ICD-10-CM | POA: Diagnosis not present

## 2021-08-22 DIAGNOSIS — R42 Dizziness and giddiness: Secondary | ICD-10-CM | POA: Diagnosis not present

## 2021-08-23 DIAGNOSIS — Z9181 History of falling: Secondary | ICD-10-CM | POA: Diagnosis not present

## 2021-08-23 DIAGNOSIS — R278 Other lack of coordination: Secondary | ICD-10-CM | POA: Diagnosis not present

## 2021-08-23 DIAGNOSIS — M6389 Disorders of muscle in diseases classified elsewhere, multiple sites: Secondary | ICD-10-CM | POA: Diagnosis not present

## 2021-08-23 DIAGNOSIS — R2681 Unsteadiness on feet: Secondary | ICD-10-CM | POA: Diagnosis not present

## 2021-08-25 DIAGNOSIS — Z9181 History of falling: Secondary | ICD-10-CM | POA: Diagnosis not present

## 2021-08-25 DIAGNOSIS — R2681 Unsteadiness on feet: Secondary | ICD-10-CM | POA: Diagnosis not present

## 2021-08-25 DIAGNOSIS — M6389 Disorders of muscle in diseases classified elsewhere, multiple sites: Secondary | ICD-10-CM | POA: Diagnosis not present

## 2021-08-25 DIAGNOSIS — R278 Other lack of coordination: Secondary | ICD-10-CM | POA: Diagnosis not present

## 2021-08-26 DIAGNOSIS — R42 Dizziness and giddiness: Secondary | ICD-10-CM | POA: Diagnosis not present

## 2021-08-26 DIAGNOSIS — Z9181 History of falling: Secondary | ICD-10-CM | POA: Diagnosis not present

## 2021-08-26 DIAGNOSIS — R41841 Cognitive communication deficit: Secondary | ICD-10-CM | POA: Diagnosis not present

## 2021-08-26 DIAGNOSIS — M6389 Disorders of muscle in diseases classified elsewhere, multiple sites: Secondary | ICD-10-CM | POA: Diagnosis not present

## 2021-08-26 DIAGNOSIS — F070 Personality change due to known physiological condition: Secondary | ICD-10-CM | POA: Diagnosis not present

## 2021-08-26 DIAGNOSIS — R2681 Unsteadiness on feet: Secondary | ICD-10-CM | POA: Diagnosis not present

## 2021-08-26 DIAGNOSIS — R278 Other lack of coordination: Secondary | ICD-10-CM | POA: Diagnosis not present

## 2021-08-29 DIAGNOSIS — Z9181 History of falling: Secondary | ICD-10-CM | POA: Diagnosis not present

## 2021-08-29 DIAGNOSIS — M6389 Disorders of muscle in diseases classified elsewhere, multiple sites: Secondary | ICD-10-CM | POA: Diagnosis not present

## 2021-08-29 DIAGNOSIS — R278 Other lack of coordination: Secondary | ICD-10-CM | POA: Diagnosis not present

## 2021-08-29 DIAGNOSIS — R2681 Unsteadiness on feet: Secondary | ICD-10-CM | POA: Diagnosis not present

## 2021-08-30 DIAGNOSIS — F070 Personality change due to known physiological condition: Secondary | ICD-10-CM | POA: Diagnosis not present

## 2021-08-30 DIAGNOSIS — R278 Other lack of coordination: Secondary | ICD-10-CM | POA: Diagnosis not present

## 2021-08-30 DIAGNOSIS — Z9181 History of falling: Secondary | ICD-10-CM | POA: Diagnosis not present

## 2021-08-30 DIAGNOSIS — M6389 Disorders of muscle in diseases classified elsewhere, multiple sites: Secondary | ICD-10-CM | POA: Diagnosis not present

## 2021-08-30 DIAGNOSIS — R2681 Unsteadiness on feet: Secondary | ICD-10-CM | POA: Diagnosis not present

## 2021-08-30 DIAGNOSIS — R41841 Cognitive communication deficit: Secondary | ICD-10-CM | POA: Diagnosis not present

## 2021-08-30 DIAGNOSIS — R42 Dizziness and giddiness: Secondary | ICD-10-CM | POA: Diagnosis not present

## 2021-08-31 DIAGNOSIS — R278 Other lack of coordination: Secondary | ICD-10-CM | POA: Diagnosis not present

## 2021-08-31 DIAGNOSIS — R42 Dizziness and giddiness: Secondary | ICD-10-CM | POA: Diagnosis not present

## 2021-08-31 DIAGNOSIS — F070 Personality change due to known physiological condition: Secondary | ICD-10-CM | POA: Diagnosis not present

## 2021-08-31 DIAGNOSIS — Z9181 History of falling: Secondary | ICD-10-CM | POA: Diagnosis not present

## 2021-08-31 DIAGNOSIS — M6389 Disorders of muscle in diseases classified elsewhere, multiple sites: Secondary | ICD-10-CM | POA: Diagnosis not present

## 2021-08-31 DIAGNOSIS — R41841 Cognitive communication deficit: Secondary | ICD-10-CM | POA: Diagnosis not present

## 2021-08-31 DIAGNOSIS — R2681 Unsteadiness on feet: Secondary | ICD-10-CM | POA: Diagnosis not present

## 2021-09-01 DIAGNOSIS — R42 Dizziness and giddiness: Secondary | ICD-10-CM | POA: Diagnosis not present

## 2021-09-01 DIAGNOSIS — R41841 Cognitive communication deficit: Secondary | ICD-10-CM | POA: Diagnosis not present

## 2021-09-01 DIAGNOSIS — F070 Personality change due to known physiological condition: Secondary | ICD-10-CM | POA: Diagnosis not present

## 2021-09-01 DIAGNOSIS — R2681 Unsteadiness on feet: Secondary | ICD-10-CM | POA: Diagnosis not present

## 2021-09-01 DIAGNOSIS — Z9181 History of falling: Secondary | ICD-10-CM | POA: Diagnosis not present

## 2021-09-01 DIAGNOSIS — R278 Other lack of coordination: Secondary | ICD-10-CM | POA: Diagnosis not present

## 2021-09-01 DIAGNOSIS — M6389 Disorders of muscle in diseases classified elsewhere, multiple sites: Secondary | ICD-10-CM | POA: Diagnosis not present

## 2021-09-02 DIAGNOSIS — M6389 Disorders of muscle in diseases classified elsewhere, multiple sites: Secondary | ICD-10-CM | POA: Diagnosis not present

## 2021-09-02 DIAGNOSIS — R278 Other lack of coordination: Secondary | ICD-10-CM | POA: Diagnosis not present

## 2021-09-02 DIAGNOSIS — R2681 Unsteadiness on feet: Secondary | ICD-10-CM | POA: Diagnosis not present

## 2021-09-02 DIAGNOSIS — Z9181 History of falling: Secondary | ICD-10-CM | POA: Diagnosis not present

## 2021-09-05 ENCOUNTER — Other Ambulatory Visit: Payer: Self-pay

## 2021-09-05 ENCOUNTER — Encounter: Payer: Self-pay | Admitting: Adult Health

## 2021-09-05 ENCOUNTER — Non-Acute Institutional Stay: Payer: Medicare PPO | Admitting: Adult Health

## 2021-09-05 VITALS — BP 156/70 | HR 62 | Temp 97.7°F | Ht 64.0 in | Wt 142.4 lb

## 2021-09-05 DIAGNOSIS — F070 Personality change due to known physiological condition: Secondary | ICD-10-CM | POA: Diagnosis not present

## 2021-09-05 DIAGNOSIS — R42 Dizziness and giddiness: Secondary | ICD-10-CM | POA: Diagnosis not present

## 2021-09-05 DIAGNOSIS — R41841 Cognitive communication deficit: Secondary | ICD-10-CM | POA: Diagnosis not present

## 2021-09-05 DIAGNOSIS — I480 Paroxysmal atrial fibrillation: Secondary | ICD-10-CM | POA: Diagnosis not present

## 2021-09-05 DIAGNOSIS — R2681 Unsteadiness on feet: Secondary | ICD-10-CM | POA: Diagnosis not present

## 2021-09-05 DIAGNOSIS — R278 Other lack of coordination: Secondary | ICD-10-CM | POA: Diagnosis not present

## 2021-09-05 DIAGNOSIS — Z9181 History of falling: Secondary | ICD-10-CM | POA: Diagnosis not present

## 2021-09-05 DIAGNOSIS — I10 Essential (primary) hypertension: Secondary | ICD-10-CM | POA: Diagnosis not present

## 2021-09-05 DIAGNOSIS — I872 Venous insufficiency (chronic) (peripheral): Secondary | ICD-10-CM

## 2021-09-05 DIAGNOSIS — H9201 Otalgia, right ear: Secondary | ICD-10-CM | POA: Diagnosis not present

## 2021-09-05 DIAGNOSIS — G3184 Mild cognitive impairment, so stated: Secondary | ICD-10-CM

## 2021-09-05 DIAGNOSIS — M6389 Disorders of muscle in diseases classified elsewhere, multiple sites: Secondary | ICD-10-CM | POA: Diagnosis not present

## 2021-09-05 NOTE — Assessment & Plan Note (Signed)
Improved with walker use. No further falls in [place]. Doing well.

## 2021-09-05 NOTE — Assessment & Plan Note (Signed)
She has adjusted well to assisted living. Appropriate level of care given for med management and increase support with bp monitoring.

## 2021-09-05 NOTE — Progress Notes (Signed)
Location:  [facility]  PZW:[facility]  Provider:  Cindi Carbon, [place] [phone]   Code Status: DNR Goals of Care:  Advanced Directives 09/05/2021  Does Patient Have a Medical Advance Directive? Yes  Type of Advance Directive Living will;Out of facility DNR (pink MOST or yellow form)  Does patient want to make changes to medical advance directive? No - Patient declined  Copy of [place] in Chart? -  Pre-existing out of facility DNR order (yellow form or pink MOST form) -     Chief Complaint  Patient presents with   Medical Management of Chronic Issues    Patient returns to the clinic to discuss going back to [place] and BP issues.    Quality Metric Gaps    No flu shot in matrix or epic.    HPI: Patient is a 88 y.o. female seen today for medical management of chronic diseases.    She has moved to [place] due to increased care needs, med management, and memory loss. MMSE 29/30 08/17/21 Had issues with low bp and was admitted to [facility]. Meds were adjusted, taken off hydralazine. Then her bp went up and she was started back on a beta blocker but then it went low so it was stopped. Now she is on norvasc 2.5 mg and doing well.   HR in the 70-80s Bp reviewed in matrix: Blood Pressure: 131 / 67 mmHg   Blood Pressure: 137 / 71 mmHg  Blood Pressure: 159 / 73 mmHg  Blood Pressure: 162 / 79 mmHg  Blood Pressure: 177 / 78 mmHg  Blood Pressure: 147 / 77 mmHg  Blood Pressure: 160 / 80 mmHg  She remains ambulatory with a walker after working with PT. Turns her right foot outward. No back or hip pain.   Had right ear pain, treated with cipro drops. May have traumatized the ear with a q tip. Referred to ENT. Apt is in March symptoms resolved.   Past Medical History:  Diagnosis Date   Actinic keratosis 09/23/2012   Anal fissure and fistula 07/28/2008   Arthralgia of temporomandibular joint 12/30/2009   Atrial fibrillation (HCC)    Atrial  tachycardia ([place])    s/p ablation 2008. [facility] (R free wall Atach)   Atrophy of vulva 09/23/2012   Blepharochalasis 03/01/2011   Cardiac dysrhythmia, unspecified 11/10/2009   Dermatophytosis of nail 02/28/2008   Dizziness and giddiness 04/03/2007   Dysphagia, unspecified(787.20) 01/17/2012   Edema 11/21/2006   External hemorrhoids without mention of complication 44/31/5400   HTN (hypertension)    Hyperlipidemia    Insomnia, unspecified 11/18/2004   Internal hemorrhoids without mention of complication 8/67/6195   Left bundle branch hemiblock    Loss of weight 03/05/2012   Myalgia and myositis, unspecified 01/05/2009   Orthostatic hypotension 03/22/2007   Other malaise and fatigue 03/05/2012   Pain in joint, site unspecified 02/28/2008   Palpitations 11/21/2006   Personality change due to conditions classified elsewhere    Pterygium eye, right 06/07/2020   Rectocele 09/23/2012   Restless legs syndrome (RLS)    Rosacea 09/23/2012   Sebaceous cyst 03/01/2011   Thumb pain 11/20/2012   Unspecified hereditary and idiopathic peripheral neuropathy     Past Surgical History:  Procedure Laterality Date   BLADDER SUSPENSION  1998   CATARACT EXTRACTION W/ INTRAOCULAR LENS IMPLANT Right 4/8//2008   Dr. Charise Killian   catheter ablation  2008   for atrial tachycardia   CYST EXCISION  1998  mucus cyst removal from finger   PTERYGIUM EXCISION  06/07/2020   TOTAL ABDOMINAL HYSTERECTOMY  2001   uterine bleeding    Allergies  Allergen Reactions   Erythromycin Nausea And Vomiting   Flecainide Acetate     Postural hypotension   Penicillins     Outpatient Encounter Medications as of 09/05/2021  Medication Sig   acetaminophen (TYLENOL) 500 MG tablet Take 1,000 mg by mouth 3 (three) times daily as needed.   amiodarone (PACERONE) 200 MG tablet Take 200 mg by mouth daily.   amLODipine (NORVASC) 2.5 MG tablet Take 2.5 mg by mouth daily.   cholecalciferol (VITAMIN D) 1000 UNITS tablet Take 1,000 Units by mouth daily.    cycloSPORINE (RESTASIS) 0.05 % ophthalmic emulsion Place 1 drop into both eyes 3 (three) times daily.   dextromethorphan-guaiFENesin (ROBAFEN DM COUGH) 10-100 MG/5ML liquid Take 10 mLs by mouth every 6 (six) hours as needed for cough.   diltiazem (CARDIZEM) 60 MG tablet Take 1 tablet (60 mg total) by mouth 3 (three) times daily as needed (For heart rate >110 bpm).   furosemide (LASIX) 20 MG tablet TAKE 1 TABLET ONCE DAILY AS NEEDED FOR SWELLING- TAKE 1/2 TABLET FOR LEG SWELLING AS NEEDED.   hydrALAZINE (APRESOLINE) 10 MG tablet Take 10 mg by mouth 3 (three) times daily as needed.   Multiple Vitamins-Minerals (PRESERVISION AREDS PO) Take 2 tablets by mouth daily.    rosuvastatin (CRESTOR) 20 MG tablet TAKE ONE TABLET BY MOUTH ONCE DAILY   XARELTO 20 MG TABS tablet TAKE 1 TABLET ONCE DAILY WITH DINNER.   [DISCONTINUED] amiodarone (PACERONE) 200 MG tablet Take 200 mg by mouth daily.   No facility-administered encounter medications on file as of 09/05/2021.    Review of Systems:  Review of Systems  Constitutional:  Negative for activity change, appetite change, chills, diaphoresis, fatigue, fever and unexpected weight change.  HENT:  Negative for congestion, ear discharge, ear pain and trouble swallowing.   Respiratory:  Negative for cough, shortness of breath and wheezing.   Cardiovascular:  Negative for chest pain, palpitations and leg swelling.  Gastrointestinal:  Negative for abdominal distention, abdominal pain, constipation and diarrhea.  Genitourinary:  Negative for difficulty urinating and dysuria.  Musculoskeletal:  Positive for gait problem. Negative for arthralgias, back pain, joint swelling and myalgias.  Neurological:  Negative for dizziness, tremors, seizures, syncope, facial asymmetry, speech difficulty, weakness, light-headedness, numbness and headaches.  Psychiatric/Behavioral:  Positive for confusion. Negative for agitation and behavioral problems.    Health Maintenance  Topic  Date Due   TETANUS/TDAP  07/14/2025   Pneumonia Vaccine 18+ Years old  Completed   INFLUENZA VACCINE  Completed   DEXA SCAN  Completed   COVID-19 Vaccine  Completed   Zoster Vaccines- Shingrix  Completed   HPV VACCINES  Aged Out    Physical Exam: Vitals:   09/05/21 1411  BP: (!) 156/70  Pulse: 62  Temp: 97.7 F (36.5 C)  SpO2: 97%  Weight: 142 lb 6.4 oz (64.6 kg)  Height: 5\' 4"  (1.626 m)   Body mass index is 24.44 kg/m. Physical Exam Vitals and nursing note reviewed.  Constitutional:      General: She is not in acute distress.    Appearance: She is not diaphoretic.  HENT:     Head: Normocephalic and atraumatic.     Right Ear: There is impacted cerumen.     Left Ear: Tympanic membrane, ear canal and external ear normal.     Nose: Nose normal.  Mouth/Throat:     Mouth: Mucous membranes are moist.     Pharynx: Oropharynx is clear.  Eyes:     Conjunctiva/sclera: Conjunctivae normal.     Pupils: Pupils are equal, round, and reactive to light.  Neck:     Vascular: No JVD.  Cardiovascular:     Rate and Rhythm: Normal rate and regular rhythm.     Heart sounds: No murmur heard. Pulmonary:     Effort: Pulmonary effort is normal. No respiratory distress.     Breath sounds: Normal breath sounds. No wheezing.  Abdominal:     General: Abdomen is flat. Bowel sounds are normal. There is no distension.     Palpations: Abdomen is soft.     Tenderness: There is no abdominal tenderness.  Musculoskeletal:     Cervical back: No rigidity or tenderness.  Lymphadenopathy:     Cervical: No cervical adenopathy.  Skin:    General: Skin is warm and dry.  Neurological:     Mental Status: She is alert and oriented to person, place, and time.  Psychiatric:        Mood and Affect: Mood normal.    Labs reviewed: Basic Metabolic Panel: Recent Labs    10/28/20 0000 07/20/21 0929 07/25/21 0000  NA 140 141 143  K 3.6 3.4* 4.3  CL 104 99 104  CO2 23* 28 27*  GLUCOSE  --  119*   --   BUN 26* 34* 13  CREATININE 0.7 1.34* 0.7  CALCIUM 9.3 9.3 9.1   Liver Function Tests: No results for input(s): AST, ALT, ALKPHOS, BILITOT, PROT, ALBUMIN in the last 8760 hours. No results for input(s): LIPASE, AMYLASE in the last 8760 hours. No results for input(s): AMMONIA in the last 8760 hours. CBC: Recent Labs    10/28/20 0000  WBC 7.0  HGB 13.1  HCT 38  PLT 254   Lipid Panel: No results for input(s): CHOL, HDL, LDLCALC, TRIG, CHOLHDL, LDLDIRECT in the last 8760 hours. No results found for: HGBA1C  Procedures since last visit: No results found.  Assessment/Plan  Atrial fibrillation ([place]) Followed by cardiology. Rate is controlled on amiodarone. Will follow lfts and TSH.  On xarelto for CVA risk reduction.   Essential hypertension BP is variable. But given her hx of erratic readings would hold off on changing anything at this time. Continue Norvasc 2.5 mg. Prn hydralazine.   Venous insufficiency of both lower extremities Mild edema in both legs unchanged. Continue prn lasix. Elevation of legs.  Unsteady gait when walking Improved with walker use. No further falls in [place]. Doing well.   Mild cognitive impairment She has adjusted well to assisted living. Appropriate level of care given for med management and increase support with bp monitoring.    Right ear pain  Resolved Ok to swim with ear plugs Has apt in March with Dr. Benjamine Mola. Will discuss with staff how to get hre ot the pool (I.e. does she need assistance)  Labs/tests ordered:  * No order type specified * CBC BMP TSH LFTs Next appt:  11/14/2021

## 2021-09-05 NOTE — Assessment & Plan Note (Signed)
Mild edema in both legs unchanged. Continue prn lasix. Elevation of legs.

## 2021-09-05 NOTE — Assessment & Plan Note (Signed)
BP is variable. But given her hx of erratic readings would hold off on changing anything at this time. Continue Norvasc 2.5 mg. Prn hydralazine.

## 2021-09-05 NOTE — Assessment & Plan Note (Signed)
Followed by cardiology. Rate is controlled on amiodarone. Will follow lfts and TSH.  On xarelto for CVA risk reduction.

## 2021-09-06 DIAGNOSIS — R278 Other lack of coordination: Secondary | ICD-10-CM | POA: Diagnosis not present

## 2021-09-06 DIAGNOSIS — M6389 Disorders of muscle in diseases classified elsewhere, multiple sites: Secondary | ICD-10-CM | POA: Diagnosis not present

## 2021-09-06 DIAGNOSIS — R2681 Unsteadiness on feet: Secondary | ICD-10-CM | POA: Diagnosis not present

## 2021-09-06 DIAGNOSIS — Z9181 History of falling: Secondary | ICD-10-CM | POA: Diagnosis not present

## 2021-09-08 DIAGNOSIS — Z9181 History of falling: Secondary | ICD-10-CM | POA: Diagnosis not present

## 2021-09-08 DIAGNOSIS — R2681 Unsteadiness on feet: Secondary | ICD-10-CM | POA: Diagnosis not present

## 2021-09-08 DIAGNOSIS — R278 Other lack of coordination: Secondary | ICD-10-CM | POA: Diagnosis not present

## 2021-09-08 DIAGNOSIS — M6389 Disorders of muscle in diseases classified elsewhere, multiple sites: Secondary | ICD-10-CM | POA: Diagnosis not present

## 2021-09-12 DIAGNOSIS — R41841 Cognitive communication deficit: Secondary | ICD-10-CM | POA: Diagnosis not present

## 2021-09-12 DIAGNOSIS — Z9181 History of falling: Secondary | ICD-10-CM | POA: Diagnosis not present

## 2021-09-12 DIAGNOSIS — R42 Dizziness and giddiness: Secondary | ICD-10-CM | POA: Diagnosis not present

## 2021-09-12 DIAGNOSIS — F070 Personality change due to known physiological condition: Secondary | ICD-10-CM | POA: Diagnosis not present

## 2021-09-13 ENCOUNTER — Telehealth: Payer: Self-pay

## 2021-09-13 NOTE — Telephone Encounter (Signed)
[facility] [phone] Called to say since decreasing amiodarone  pt's BP has been high 160 /170 they have been giving hydralazine but its still high. No other symptoms

## 2021-09-13 NOTE — Telephone Encounter (Signed)
Change hydralazine to 50 mg TID and increase amlodipine to 5 mg daily from 2.5 mg. 90 day Rx

## 2021-09-14 ENCOUNTER — Other Ambulatory Visit: Payer: Self-pay

## 2021-09-14 MED ORDER — HYDRALAZINE HCL 25 MG PO TABS: 25.0000 mg | ORAL_TABLET | Freq: Three times a day (TID) | ORAL | 1 refills | Status: DC

## 2021-09-14 MED ORDER — AMLODIPINE BESYLATE 5 MG PO TABS: 5.0000 mg | ORAL_TABLET | Freq: Every day | ORAL | 1 refills | Status: DC

## 2021-09-14 NOTE — Telephone Encounter (Signed)
Done faxed prescriptions to [phone]

## 2021-09-14 NOTE — Telephone Encounter (Signed)
Make it 25 mg TID

## 2021-09-15 DIAGNOSIS — Z9181 History of falling: Secondary | ICD-10-CM | POA: Diagnosis not present

## 2021-09-15 DIAGNOSIS — M6389 Disorders of muscle in diseases classified elsewhere, multiple sites: Secondary | ICD-10-CM | POA: Diagnosis not present

## 2021-09-15 DIAGNOSIS — R278 Other lack of coordination: Secondary | ICD-10-CM | POA: Diagnosis not present

## 2021-09-15 DIAGNOSIS — R2681 Unsteadiness on feet: Secondary | ICD-10-CM | POA: Diagnosis not present

## 2021-09-19 DIAGNOSIS — R42 Dizziness and giddiness: Secondary | ICD-10-CM | POA: Diagnosis not present

## 2021-09-19 DIAGNOSIS — Z9181 History of falling: Secondary | ICD-10-CM | POA: Diagnosis not present

## 2021-09-19 DIAGNOSIS — R41841 Cognitive communication deficit: Secondary | ICD-10-CM | POA: Diagnosis not present

## 2021-09-19 DIAGNOSIS — F070 Personality change due to known physiological condition: Secondary | ICD-10-CM | POA: Diagnosis not present

## 2021-09-20 DIAGNOSIS — R2681 Unsteadiness on feet: Secondary | ICD-10-CM | POA: Diagnosis not present

## 2021-09-20 DIAGNOSIS — Z9181 History of falling: Secondary | ICD-10-CM | POA: Diagnosis not present

## 2021-09-20 DIAGNOSIS — R278 Other lack of coordination: Secondary | ICD-10-CM | POA: Diagnosis not present

## 2021-09-20 DIAGNOSIS — M6389 Disorders of muscle in diseases classified elsewhere, multiple sites: Secondary | ICD-10-CM | POA: Diagnosis not present

## 2021-09-20 NOTE — Progress Notes (Signed)
Primary Physician/Referring:  [name], MD  Patient ID: Rebekah Graham, female    DOB: 12-29-1928, 88 y.o.   MRN: [id number]  Chief Complaint  Patient presents with   Hypertension   HPI:     Rebekah Graham  is a 88 y.o. Caucasian female with history of paroxysmal atrial fibrillation, hypertension and hyperlipidemia.    Patient presents for office visit for management of hypertension.  She was last seen in the office 08/16/2021 at which time amiodarone was reduced to 200 mg once daily, no other changes were made.  Subsequently patient's assisted living nurse called our office with concerns of patient's blood pressure as high as 962-229 mmHg systolic.  Therefore increased hydralazine to 25 mg 3 times daily and increased amlodpine from 2.5 mg to 5 mg daily. Patient is tolerating increased medication doses without issue. She is feeling well overall, stating she recently resumed water aerobics without issue. She continues to tolerate anticoagulation without bleeding diathesis.   Patient brings with her records from [facility].  Since increasing hydralazine and amlodipine patient's blood pressure is well controlled and on home monitoring heart rate is under excellent control.  Notably patient's heart rate was initially elevated in the office today at the time of initial vitals and EKG, however upon recheck during my exam heart rate is now down to approximately 82 bpm.  Past Medical History:  Diagnosis Date   Actinic keratosis 09/23/2012   Anal fissure and fistula 07/28/2008   Arthralgia of temporomandibular joint 12/30/2009   Atrial fibrillation ([place])    Atrial tachycardia ([place])    s/p ablation 2008. [facility] (R free wall Atach)   Atrophy of vulva 09/23/2012   Blepharochalasis 03/01/2011   Cardiac dysrhythmia, unspecified 11/10/2009   Dermatophytosis of nail 02/28/2008   Dizziness and giddiness 04/03/2007   Dysphagia, unspecified(787.20) 01/17/2012   Edema 11/21/2006   External  hemorrhoids without mention of complication 79/89/2119   HTN (hypertension)    Hyperlipidemia    Insomnia, unspecified 11/18/2004   Internal hemorrhoids without mention of complication 11/29/4079   Left bundle branch hemiblock    Loss of weight 03/05/2012   Myalgia and myositis, unspecified 01/05/2009   Orthostatic hypotension 03/22/2007   Other malaise and fatigue 03/05/2012   Pain in joint, site unspecified 02/28/2008   Palpitations 11/21/2006   Personality change due to conditions classified elsewhere    Pterygium eye, right 06/07/2020   Rectocele 09/23/2012   Restless legs syndrome (RLS)    Rosacea 09/23/2012   Sebaceous cyst 03/01/2011   Thumb pain 11/20/2012   Unspecified hereditary and idiopathic peripheral neuropathy    Past Surgical History:  Procedure Laterality Date   BLADDER SUSPENSION  1998   CATARACT EXTRACTION W/ INTRAOCULAR LENS IMPLANT Right 4/8//2008   Dr. Charise Killian   catheter ablation  2008   for atrial tachycardia   CYST EXCISION  1998   mucus cyst removal from finger   PTERYGIUM EXCISION  06/07/2020   TOTAL ABDOMINAL HYSTERECTOMY  2001   uterine bleeding   Social History   Tobacco Use   Smoking status: Never   Smokeless tobacco: Never   Tobacco comments:    tobacco  - none  Substance Use Topics   Alcohol use: Yes    Comment: 1-2 glasses of wine/gin daily     Family History  Problem Relation Age of Onset   Heart disease Mother    Heart disease Father    Cancer Sister        breast  ROS  Review of Systems  Constitutional: Negative for malaise/fatigue and weight gain.  Cardiovascular:  Negative for chest pain, claudication, dyspnea on exertion, leg swelling, near-syncope, orthopnea, palpitations, paroxysmal nocturnal dyspnea and syncope.  Skin:        Easy bruising  Neurological:  Negative for dizziness and weakness.  Objective  Blood pressure (!) 143/82, pulse (!) 110, temperature 98.2 F (36.8 C), temperature source Temporal, resp. rate 17, height 5\' 4"   (1.626 m), weight 143 lb (64.9 kg), SpO2 96 %. Body mass index is 24.55 kg/m.    Physical Exam Vitals reviewed.  Cardiovascular:     Rate and Rhythm: Normal rate and regular rhythm.     Pulses: Intact distal pulses.     Heart sounds: S1 normal and S2 normal. No murmur heard.   No gallop.  Pulmonary:     Effort: Pulmonary effort is normal. No respiratory distress.     Breath sounds: No wheezing, rhonchi or rales.  Musculoskeletal:     Right lower leg: No edema.     Left lower leg: No edema.  Skin:    General: Skin is warm and dry.  Neurological:     Mental Status: [name]   Laboratory examination:   CMP Latest Ref Rng & Units 07/25/2021 07/20/2021 10/28/2020  Glucose 65 - 99 mg/dL - 119(H) -  BUN 4 - 21 13 34(H) 26(A)  Creatinine 0.5 - 1.1 0.7 1.34(H) 0.7  Sodium 137 - 147 143 141 140  Potassium 3.4 - 5.3 4.3 3.4(L) 3.6  Chloride 99 - 108 104 99 104  CO2 13 - 22 27(A) 28 23(A)  Calcium 8.7 - 10.7 9.1 9.3 9.3  Alkaline Phos 25 - 125 - - -  AST 13 - 35 - - -  ALT 7 - 35 - - -   CBC Latest Ref Rng & Units 10/28/2020 04/20/2020 04/20/2020  WBC - 7.0 4.9 4.9  Hemoglobin 12.0 - 16.0 13.1 13.0 13.0  Hematocrit 36 - 46 38 39 246(A)  Platelets 150 - 399 254 - -   Lipid Panel     Component Value Date/Time   CHOL 160 04/22/2019 0000   TRIG 73 04/22/2019 0000   HDL 59 04/22/2019 0000   LDLCALC 87 04/22/2019 0000   HEMOGLOBIN A1C No results found for: HGBA1C, MPG TSH No results for input(s): TSH in the last 8760 hours.  Allergies   Allergies  Allergen Reactions   Erythromycin Nausea And Vomiting   Flecainide Acetate     Postural hypotension   Penicillins     Medications Prior to Visit:   Outpatient Medications Prior to Visit  Medication Sig Dispense Refill   acetaminophen (TYLENOL) 500 MG tablet Take 1,000 mg by mouth 3 (three) times daily as needed.     amiodarone (PACERONE) 200 MG tablet Take 200 mg by mouth daily.     amLODipine (NORVASC) 5 MG tablet Take 1  tablet (5 mg total) by mouth daily. 90 tablet 1   cycloSPORINE (RESTASIS) 0.05 % ophthalmic emulsion Place 1 drop into both eyes 3 (three) times daily.     diltiazem (CARDIZEM) 60 MG tablet Take 1 tablet (60 mg total) by mouth 3 (three) times daily as needed (For heart rate >110 bpm). 30 tablet 3   furosemide (LASIX) 20 MG tablet TAKE 1 TABLET ONCE DAILY AS NEEDED FOR SWELLING- TAKE 1/2 TABLET FOR LEG SWELLING AS NEEDED. 60 tablet 0   hydrALAZINE (APRESOLINE) 25 MG tablet Take 1 tablet (25 mg  total) by mouth 3 (three) times daily. 270 tablet 1   Multiple Vitamins-Minerals (PRESERVISION AREDS PO) Take 2 tablets by mouth daily.      rosuvastatin (CRESTOR) 20 MG tablet TAKE ONE TABLET BY MOUTH ONCE DAILY 90 tablet 0   XARELTO 20 MG TABS tablet TAKE 1 TABLET ONCE DAILY WITH DINNER. 90 tablet 1   dextromethorphan-guaiFENesin (ROBAFEN DM COUGH) 10-100 MG/5ML liquid Take 10 mLs by mouth every 6 (six) hours as needed for cough. (Patient not taking: Reported on 09/22/2021)     cholecalciferol (VITAMIN D) 1000 UNITS tablet Take 1,000 Units by mouth daily.     No facility-administered medications prior to visit.   Final Medications at End of Visit    Current Meds  Medication Sig   acetaminophen (TYLENOL) 500 MG tablet Take 1,000 mg by mouth 3 (three) times daily as needed.   amiodarone (PACERONE) 200 MG tablet Take 200 mg by mouth daily.   amLODipine (NORVASC) 5 MG tablet Take 1 tablet (5 mg total) by mouth daily.   cycloSPORINE (RESTASIS) 0.05 % ophthalmic emulsion Place 1 drop into both eyes 3 (three) times daily.   diltiazem (CARDIZEM) 60 MG tablet Take 1 tablet (60 mg total) by mouth 3 (three) times daily as needed (For heart rate >110 bpm).   furosemide (LASIX) 20 MG tablet TAKE 1 TABLET ONCE DAILY AS NEEDED FOR SWELLING- TAKE 1/2 TABLET FOR LEG SWELLING AS NEEDED.   hydrALAZINE (APRESOLINE) 25 MG tablet Take 1 tablet (25 mg total) by mouth 3 (three) times daily.   Multiple Vitamins-Minerals  (PRESERVISION AREDS PO) Take 2 tablets by mouth daily.    rosuvastatin (CRESTOR) 20 MG tablet TAKE ONE TABLET BY MOUTH ONCE DAILY   XARELTO 20 MG TABS tablet TAKE 1 TABLET ONCE DAILY WITH DINNER.    Radiology:  No results found.  Cardiac Studies:   Echo- 07/13/2015 1. Left ventricle cavity is normal in size. Mild concentric hypertrophy of the left ventricle. Normal global wall motion. Calculated EF 66%. 2. Left atrial cavity is mildly dilated. 3. Trace aortic regurgitation. 4. Mild mitral regurgitation. 5. Mild tricuspid regurgitation. No evidence of pulmonary hypertension.  Carotid artery duplex 10/14/2020: Minimal stenosis in the right internal carotid artery (minimal). Stenosis in the right external carotid artery (<50%). Stenosis in the left internal carotid artery (16-49%). Stenosis in the left external carotid artery (<50%). Antegrade right vertebral artery flow. Antegrade left vertebral artery flow. Compared to 05/03/2020, left ICA stenosis of 50-69% is now 16-49%.  Upper limit of the spectrum.  Overall, no significant change. Follow up in one year is appropriate if clinically indicated.  EKG:   09/22/2021: Sinus tachycardia at a rate of 110 bpm.  07/25/2021: Sinus rhythm at a rate of 67 bpm.  Normal axis.  Nonspecific T wave abnormality.  Poor R wave progression, cannot exclude anteroseptal infarct old.  Compared to EKG 05/19/2021, PR WP is new.  07/21/2021: Atypical atrial flutter with 2-1 conduction at a rate of 140 bpm.  EKG 05/19/2021: Sinus bradycardia at rate of 56 bpm, left atrial enlargement, otherwise normal EKG.  Compared to 11/11/2020, no significant change.  EKG 09/02/18/20: Probably ectopic atrial rhythm at the rate of 99 bpm with borderline first-degree AV block.  Normal axis.  Nonspecific T abnormality.   Assessment     ICD-10-CM   1. Essential hypertension  I10 EKG 12-Lead    2. Paroxysmal atrial fibrillation (HCC)  I48.0        Medications Discontinued  During This Encounter  Medication Reason   cholecalciferol (VITAMIN D) 1000 UNITS tablet      No orders of the defined types were placed in this encounter. This patients CHA2DS2-VASc Score 4 (HTN, Age, F)and yearly risk of stroke 4.8%.   Recommendations:    Rebekah Graham  is a 88 y.o. Caucasian female with history of paroxysmal atrial fibrillation, hypertension and hyperlipidemia.    Patient presents for office visit for management of hypertension.  She was last seen in the office 08/16/2021 at which time amiodarone was reduced to 200 mg once daily, no other changes were made.  Subsequently patient's assisted living nurse called our office with concerns of patient's blood pressure as high as 035-465 mmHg systolic.  Therefore increased hydralazine to 25 mg 3 times daily and increased amlodpine from 2.5 mg to 5 mg daily.  I personally reviewed external vitals records from [facility], blood pressure is now well controlled with increased dose of hydralazine and amlodipine which patient is tolerating well.  Heart rate is also typically well controlled.  Although was elevated initially in the office today patient states her drive over with stressful.  Patient is feeling well overall and continues to tolerate anticoagulation without bleeding diathesis.  Will not make changes to patient's medications at this time.  Continue amiodarone, amlodipine, diltiazem, hydralazine, and Xarelto, as well as Crestor and Lasix.   Follow up in 6 months, sooner if needed.    Alethia Berthold, PA-C 09/22/2021, 1:25 PM Office: [phone]

## 2021-09-21 DIAGNOSIS — R278 Other lack of coordination: Secondary | ICD-10-CM | POA: Diagnosis not present

## 2021-09-21 DIAGNOSIS — M6389 Disorders of muscle in diseases classified elsewhere, multiple sites: Secondary | ICD-10-CM | POA: Diagnosis not present

## 2021-09-21 DIAGNOSIS — R41841 Cognitive communication deficit: Secondary | ICD-10-CM | POA: Diagnosis not present

## 2021-09-21 DIAGNOSIS — F070 Personality change due to known physiological condition: Secondary | ICD-10-CM | POA: Diagnosis not present

## 2021-09-21 DIAGNOSIS — R2681 Unsteadiness on feet: Secondary | ICD-10-CM | POA: Diagnosis not present

## 2021-09-21 DIAGNOSIS — Z9181 History of falling: Secondary | ICD-10-CM | POA: Diagnosis not present

## 2021-09-21 DIAGNOSIS — R42 Dizziness and giddiness: Secondary | ICD-10-CM | POA: Diagnosis not present

## 2021-09-22 ENCOUNTER — Encounter: Payer: Self-pay | Admitting: Student

## 2021-09-22 ENCOUNTER — Ambulatory Visit: Payer: Medicare PPO | Admitting: Student

## 2021-09-22 ENCOUNTER — Other Ambulatory Visit: Payer: Self-pay

## 2021-09-22 VITALS — BP 143/82 | HR 110 | Temp 98.2°F | Resp 17 | Ht 64.0 in | Wt 143.0 lb

## 2021-09-22 DIAGNOSIS — I10 Essential (primary) hypertension: Secondary | ICD-10-CM

## 2021-09-22 DIAGNOSIS — I480 Paroxysmal atrial fibrillation: Secondary | ICD-10-CM | POA: Diagnosis not present

## 2021-09-23 DIAGNOSIS — M6389 Disorders of muscle in diseases classified elsewhere, multiple sites: Secondary | ICD-10-CM | POA: Diagnosis not present

## 2021-09-23 DIAGNOSIS — R42 Dizziness and giddiness: Secondary | ICD-10-CM | POA: Diagnosis not present

## 2021-09-23 DIAGNOSIS — R278 Other lack of coordination: Secondary | ICD-10-CM | POA: Diagnosis not present

## 2021-09-23 DIAGNOSIS — R2681 Unsteadiness on feet: Secondary | ICD-10-CM | POA: Diagnosis not present

## 2021-09-23 DIAGNOSIS — Z9181 History of falling: Secondary | ICD-10-CM | POA: Diagnosis not present

## 2021-09-23 DIAGNOSIS — R41841 Cognitive communication deficit: Secondary | ICD-10-CM | POA: Diagnosis not present

## 2021-09-23 DIAGNOSIS — F070 Personality change due to known physiological condition: Secondary | ICD-10-CM | POA: Diagnosis not present

## 2021-09-26 DIAGNOSIS — M6389 Disorders of muscle in diseases classified elsewhere, multiple sites: Secondary | ICD-10-CM | POA: Diagnosis not present

## 2021-09-26 DIAGNOSIS — Z9181 History of falling: Secondary | ICD-10-CM | POA: Diagnosis not present

## 2021-09-26 DIAGNOSIS — R278 Other lack of coordination: Secondary | ICD-10-CM | POA: Diagnosis not present

## 2021-09-26 DIAGNOSIS — R42 Dizziness and giddiness: Secondary | ICD-10-CM | POA: Diagnosis not present

## 2021-09-26 DIAGNOSIS — R41841 Cognitive communication deficit: Secondary | ICD-10-CM | POA: Diagnosis not present

## 2021-09-26 DIAGNOSIS — R2681 Unsteadiness on feet: Secondary | ICD-10-CM | POA: Diagnosis not present

## 2021-09-26 DIAGNOSIS — F070 Personality change due to known physiological condition: Secondary | ICD-10-CM | POA: Diagnosis not present

## 2021-09-28 DIAGNOSIS — R41841 Cognitive communication deficit: Secondary | ICD-10-CM | POA: Diagnosis not present

## 2021-09-28 DIAGNOSIS — R278 Other lack of coordination: Secondary | ICD-10-CM | POA: Diagnosis not present

## 2021-09-28 DIAGNOSIS — F070 Personality change due to known physiological condition: Secondary | ICD-10-CM | POA: Diagnosis not present

## 2021-09-28 DIAGNOSIS — Z9181 History of falling: Secondary | ICD-10-CM | POA: Diagnosis not present

## 2021-09-28 DIAGNOSIS — M6389 Disorders of muscle in diseases classified elsewhere, multiple sites: Secondary | ICD-10-CM | POA: Diagnosis not present

## 2021-09-28 DIAGNOSIS — R42 Dizziness and giddiness: Secondary | ICD-10-CM | POA: Diagnosis not present

## 2021-09-28 DIAGNOSIS — R2681 Unsteadiness on feet: Secondary | ICD-10-CM | POA: Diagnosis not present

## 2021-09-30 ENCOUNTER — Encounter: Payer: Self-pay | Admitting: Adult Health

## 2021-09-30 NOTE — Progress Notes (Signed)
This encounter was created in error - please disregard.

## 2021-10-03 DIAGNOSIS — Z9181 History of falling: Secondary | ICD-10-CM | POA: Diagnosis not present

## 2021-10-03 DIAGNOSIS — R2681 Unsteadiness on feet: Secondary | ICD-10-CM | POA: Diagnosis not present

## 2021-10-03 DIAGNOSIS — M6389 Disorders of muscle in diseases classified elsewhere, multiple sites: Secondary | ICD-10-CM | POA: Diagnosis not present

## 2021-10-03 DIAGNOSIS — R278 Other lack of coordination: Secondary | ICD-10-CM | POA: Diagnosis not present

## 2021-10-05 DIAGNOSIS — H353132 Nonexudative age-related macular degeneration, bilateral, intermediate dry stage: Secondary | ICD-10-CM | POA: Diagnosis not present

## 2021-10-05 DIAGNOSIS — H04123 Dry eye syndrome of bilateral lacrimal glands: Secondary | ICD-10-CM | POA: Diagnosis not present

## 2021-10-05 DIAGNOSIS — H02122 Mechanical ectropion of right lower eyelid: Secondary | ICD-10-CM | POA: Diagnosis not present

## 2021-10-05 DIAGNOSIS — H02125 Mechanical ectropion of left lower eyelid: Secondary | ICD-10-CM | POA: Diagnosis not present

## 2021-10-06 DIAGNOSIS — R42 Dizziness and giddiness: Secondary | ICD-10-CM | POA: Diagnosis not present

## 2021-10-06 DIAGNOSIS — F070 Personality change due to known physiological condition: Secondary | ICD-10-CM | POA: Diagnosis not present

## 2021-10-06 DIAGNOSIS — Z9181 History of falling: Secondary | ICD-10-CM | POA: Diagnosis not present

## 2021-10-06 DIAGNOSIS — R41841 Cognitive communication deficit: Secondary | ICD-10-CM | POA: Diagnosis not present

## 2021-10-11 ENCOUNTER — Non-Acute Institutional Stay: Payer: Medicare PPO | Admitting: Orthopedic Surgery

## 2021-10-11 ENCOUNTER — Encounter: Payer: Self-pay | Admitting: Orthopedic Surgery

## 2021-10-11 DIAGNOSIS — M79671 Pain in right foot: Secondary | ICD-10-CM

## 2021-10-11 DIAGNOSIS — H02102 Unspecified ectropion of right lower eyelid: Secondary | ICD-10-CM | POA: Diagnosis not present

## 2021-10-11 DIAGNOSIS — H0100B Unspecified blepharitis left eye, upper and lower eyelids: Secondary | ICD-10-CM | POA: Diagnosis not present

## 2021-10-11 DIAGNOSIS — Z66 Do not resuscitate: Secondary | ICD-10-CM | POA: Diagnosis not present

## 2021-10-11 DIAGNOSIS — I872 Venous insufficiency (chronic) (peripheral): Secondary | ICD-10-CM | POA: Diagnosis not present

## 2021-10-11 DIAGNOSIS — I10 Essential (primary) hypertension: Secondary | ICD-10-CM

## 2021-10-11 DIAGNOSIS — H0100A Unspecified blepharitis right eye, upper and lower eyelids: Secondary | ICD-10-CM | POA: Diagnosis not present

## 2021-10-11 DIAGNOSIS — I480 Paroxysmal atrial fibrillation: Secondary | ICD-10-CM

## 2021-10-11 DIAGNOSIS — H00011 Hordeolum externum right upper eyelid: Secondary | ICD-10-CM | POA: Diagnosis not present

## 2021-10-11 DIAGNOSIS — G3184 Mild cognitive impairment, so stated: Secondary | ICD-10-CM

## 2021-10-11 NOTE — Progress Notes (Signed)
Location:  [place] Room Number: 130/Q Place of Service:  [phone]) Provider: Yvonna Alanis, NP  Patient Care Team: [name], MD as PCP - General (Internal Medicine) Community, [facility] Krell, Claudette T, NP as Nurse Practitioner (Geriatric Medicine) Adrian Prows, MD as Consulting Physician (Cardiology) Earlean Polka, MD as Consulting Physician (Ophthalmology)  Extended Emergency Contact Information Primary Emergency Contact: [name] of [place] Phone: [phone] Work Phone: [phone] Relation: Son Secondary Emergency Contact: [facility] Mobile Phone: [phone] Relation: Friend  Code Status:  DNR Goals of care: Advanced Directive information Advanced Directives 10/11/2021  Does Patient Have a Medical Advance Directive? Yes  Type of Paramedic of [place];Living will;Out of facility DNR (pink MOST or yellow form)  Does patient want to make changes to medical advance directive? No - Patient declined  Copy of [place] in Chart? Yes - validated most recent copy scanned in chart (See row information)  Pre-existing out of facility DNR order (yellow form or pink MOST form) Yellow form placed in chart (order not valid for inpatient use)     Chief Complaint  Patient presents with   Acute Visit    Right foot pain    HPI:  Pt is a 88 y.o. female seen today for an acute visit for right foot pain.   Right foot pain x 1 week. Pain described as mild, increased with movement. She has not tried any interventions to improve pain. She is more concerned about the swelling in her lower extremities, R>L. Follows low sodium diet. Admits to sitting in recliner a few hours daily. She has a history of venous insufficiency and remains on furosemide 20 mg daily prn for swelling. No recent falls or injuries. Denies chest pain and sob.   Recently moved to [place], she believes  she had adjusted well.   Atrial fibrillation- HR controlled with Cardizem, remains on xarelo for clot prevention HTN- BUN/creat 13/0.7 07/25/2021, remains on amlodipine and hydralazine  Impaired memory- 29/30 08/17/2021, doing well with medication assistance in [place]    Past Medical History:  Diagnosis Date   Actinic keratosis 09/23/2012   Anal fissure and fistula 07/28/2008   Arthralgia of temporomandibular joint 12/30/2009   Atrial fibrillation ([place])    Atrial tachycardia ([place])    s/p ablation 2008. [facility] (R free wall Atach)   Atrophy of vulva 09/23/2012   Blepharochalasis 03/01/2011   Cardiac dysrhythmia, unspecified 11/10/2009   Dermatophytosis of nail 02/28/2008   Dizziness and giddiness 04/03/2007   Dysphagia, unspecified(787.20) 01/17/2012   Edema 11/21/2006   External hemorrhoids without mention of complication 34/74/2595   HTN (hypertension)    Hyperlipidemia    Insomnia, unspecified 11/18/2004   Internal hemorrhoids without mention of complication 6/38/7564   Left bundle branch hemiblock    Loss of weight 03/05/2012   Myalgia and myositis, unspecified 01/05/2009   Orthostatic hypotension 03/22/2007   Other malaise and fatigue 03/05/2012   Pain in joint, site unspecified 02/28/2008   Palpitations 11/21/2006   Personality change due to conditions classified elsewhere    Pterygium eye, right 06/07/2020   Rectocele 09/23/2012   Restless legs syndrome (RLS)    Rosacea 09/23/2012   Sebaceous cyst 03/01/2011   Thumb pain 11/20/2012   Unspecified hereditary and idiopathic peripheral neuropathy    Past Surgical History:  Procedure Laterality Date   BLADDER SUSPENSION  1998   CATARACT EXTRACTION W/ INTRAOCULAR LENS IMPLANT Right 4/8//2008   Dr.  Epes   catheter ablation  2008   for atrial tachycardia   CYST EXCISION  1998   mucus cyst removal from finger   PTERYGIUM EXCISION  06/07/2020   TOTAL ABDOMINAL HYSTERECTOMY  2001   uterine bleeding    Allergies  Allergen Reactions    Erythromycin Nausea And Vomiting   Flecainide Acetate     Postural hypotension   Penicillins     Outpatient Encounter Medications as of 10/11/2021  Medication Sig   acetaminophen (TYLENOL) 500 MG tablet Take 500 mg by mouth every 8 (eight) hours as needed.   amLODipine (NORVASC) 5 MG tablet Take 1 tablet (5 mg total) by mouth daily.   cholecalciferol (VITAMIN D3) 25 MCG (1000 UNIT) tablet Take 1,000 Units by mouth daily.   cycloSPORINE (RESTASIS) 0.05 % ophthalmic emulsion Place 1 drop into both eyes in the morning, at noon, in the evening, and at bedtime.   dextromethorphan-guaiFENesin (ROBAFEN DM COUGH) 10-100 MG/5ML liquid Take 10 mLs by mouth every 6 (six) hours as needed for cough.   diltiazem (CARDIZEM) 60 MG tablet Take 1 tablet (60 mg total) by mouth 3 (three) times daily as needed (For heart rate >110 bpm).   furosemide (LASIX) 20 MG tablet TAKE 1 TABLET ONCE DAILY AS NEEDED FOR SWELLING- TAKE 1/2 TABLET FOR LEG SWELLING AS NEEDED.   hydrALAZINE (APRESOLINE) 10 MG tablet Take 10 mg by mouth 3 (three) times daily.   hydrALAZINE (APRESOLINE) 25 MG tablet Take 1 tablet (25 mg total) by mouth 3 (three) times daily.   Multiple Vitamins-Minerals (PRESERVISION AREDS PO) Take 2 tablets by mouth daily.    rosuvastatin (CRESTOR) 20 MG tablet TAKE ONE TABLET BY MOUTH ONCE DAILY   sodium fluoride (FLUORISHIELD) 1.1 % GEL dental gel Place 1 application onto teeth at bedtime.   XARELTO 20 MG TABS tablet TAKE 1 TABLET ONCE DAILY WITH DINNER.   No facility-administered encounter medications on file as of 10/11/2021.    Review of Systems  Constitutional:  Negative for activity change, appetite change, chills and fever.  HENT:  Negative for congestion and trouble swallowing.   Eyes:  Negative for photophobia and visual disturbance.  Respiratory:  Negative for cough, shortness of breath and wheezing.   Cardiovascular:  Positive for leg swelling. Negative for chest pain.  Gastrointestinal:   Negative for abdominal distention, abdominal pain, blood in stool, constipation, diarrhea, nausea and rectal pain.  Genitourinary:  Negative for dysuria, frequency and hematuria.  Musculoskeletal:  Positive for joint swelling. Negative for arthralgias and gait problem.       Right foot pain  Skin:  Negative for rash.  Neurological:  Negative for dizziness, weakness and headaches.  Psychiatric/Behavioral:  Positive for confusion. Negative for dysphoric mood and sleep disturbance. The patient is not nervous/anxious.    Immunization History  Administered Date(s) Administered   Influenza Whole 06/12/2012   Influenza, High Dose Seasonal PF 05/23/2019, 06/11/2020   Influenza,inj,Quad PF,6+ Mos 05/13/2013, 06/07/2018   Influenza-Unspecified 05/27/2014, 04/24/2015, 04/13/2016, 04/24/2017, 06/11/2020, 06/11/2021   Moderna Covid-19 Vaccine Bivalent Booster 4yrs & up 06/21/2021   Moderna SARS-COV2 Booster Vaccination 04/18/2021   Moderna Sars-Covid-2 Vaccination 08/26/2019, 09/23/2019, 06/29/2020   Pneumococcal Conjugate-13 05/27/2014   Pneumococcal Polysaccharide-23 08/14/1997   Td 04/15/2011   Tdap 07/15/2015   Zoster Recombinat (Shingrix) 02/12/2017, 06/28/2017   Zoster, Live 08/15/2007   Pertinent  Health Maintenance Due  Topic Date Due   INFLUENZA VACCINE  Completed   DEXA SCAN  Completed   Fall Risk 06/10/2020  11/08/2020 05/18/2021 07/20/2021 09/05/2021  Falls in the past year? 0 0 0 1 0  Was there an injury with Fall? 0 0 - 0 0  Was there an injury with Fall? - - - - -  Fall Risk Category Calculator 0 0 - 1 0  Fall Risk Category Low Low - Low Low  Patient Fall Risk Level Low fall risk Low fall risk Low fall risk Low fall risk Low fall risk  Patient at Risk for Falls Due to - - - History of fall(s) No Fall Risks  Fall risk Follow up - Falls evaluation completed Falls evaluation completed Falls evaluation completed;Education provided;Falls prevention discussed Falls evaluation completed    Functional Status Survey:    Vitals:   10/11/21 0926  BP: 136/70  Pulse: 77  Resp: 19  Temp: (!) 97.3 F (36.3 C)  SpO2: 96%  Weight: 141 lb (64 kg)  Height: 5\' 4"  (1.626 m)   Body mass index is 24.2 kg/m. Physical Exam Vitals reviewed.  Constitutional:      General: She is not in acute distress. HENT:     Head: Normocephalic.  Eyes:     General:        Right eye: No discharge.        Left eye: No discharge.  Neck:     Vascular: No carotid bruit.  Cardiovascular:     Rate and Rhythm: Normal rate and regular rhythm.     Pulses: Normal pulses.     Heart sounds: Normal heart sounds. No murmur heard. Pulmonary:     Effort: Pulmonary effort is normal. No respiratory distress.     Breath sounds: Normal breath sounds. No wheezing.  Abdominal:     General: Bowel sounds are normal. There is no distension.     Palpations: Abdomen is soft.     Tenderness: There is no abdominal tenderness.  Musculoskeletal:     Cervical back: Neck supple.     Right lower leg: Edema present.     Left lower leg: Edema present.     Right ankle: Swelling present. No deformity or ecchymosis. No tenderness. Normal range of motion.     Comments: R>L, 1+ pitting edema  Lymphadenopathy:     Cervical: No cervical adenopathy.  Skin:    General: Skin is warm and dry.     Capillary Refill: Capillary refill takes less than 2 seconds.  Neurological:     General: No focal deficit present.     Mental Status: She is alert and oriented to person, place, and time.     Motor: No weakness.     Gait: Gait normal.  Psychiatric:        Mood and Affect: Mood normal.        Behavior: Behavior normal.    Labs reviewed: Recent Labs    10/28/20 0000 07/20/21 0929 07/25/21 0000  NA 140 141 143  K 3.6 3.4* 4.3  CL 104 99 104  CO2 23* 28 27*  GLUCOSE  --  119*  --   BUN 26* 34* 13  CREATININE 0.7 1.34* 0.7  CALCIUM 9.3 9.3 9.1   No results for input(s): AST, ALT, ALKPHOS, BILITOT, PROT, ALBUMIN in  the last 8760 hours. Recent Labs    10/28/20 0000  WBC 7.0  HGB 13.1  HCT 38  PLT 254   Lab Results  Component Value Date   TSH 3.19 04/20/2020   TSH 3.19 04/20/2020   No results found for: HGBA1C Lab  Results  Component Value Date   CHOL 160 04/22/2019   HDL 59 04/22/2019   LDLCALC 87 04/22/2019   TRIG 73 04/22/2019    Significant Diagnostic Results in last 30 days:  No results found.  Assessment/Plan: 1. Do not resuscitate  2. Venous insufficiency of both lower extremities - 1+ pitting edema to BLE, R>L - start furosemide 20 mg po daily x 2 days - cont low sodium diet  3. Right foot pain - exam unremarkable - suspect from increased swelling - see above - recommend xray if pain continues  4. Paroxysmal atrial fibrillation (HCC) - HR controlled with diltiazem - cont xarelto for clot prevention  5. Essential hypertension - controlled  - cont amlodipine and hydralazine  6. Mild cognitive impairment - adjusting well to [place] - MMSE 29/30 08/2021    Family/ staff Communication: plan discussed with patient and nurse  Labs/tests ordered:  none

## 2021-10-12 DIAGNOSIS — Z9181 History of falling: Secondary | ICD-10-CM | POA: Diagnosis not present

## 2021-10-12 DIAGNOSIS — R2681 Unsteadiness on feet: Secondary | ICD-10-CM | POA: Diagnosis not present

## 2021-10-12 DIAGNOSIS — M6389 Disorders of muscle in diseases classified elsewhere, multiple sites: Secondary | ICD-10-CM | POA: Diagnosis not present

## 2021-10-12 DIAGNOSIS — R278 Other lack of coordination: Secondary | ICD-10-CM | POA: Diagnosis not present

## 2021-10-13 DIAGNOSIS — F070 Personality change due to known physiological condition: Secondary | ICD-10-CM | POA: Diagnosis not present

## 2021-10-13 DIAGNOSIS — Z9181 History of falling: Secondary | ICD-10-CM | POA: Diagnosis not present

## 2021-10-13 DIAGNOSIS — R42 Dizziness and giddiness: Secondary | ICD-10-CM | POA: Diagnosis not present

## 2021-10-13 DIAGNOSIS — R41841 Cognitive communication deficit: Secondary | ICD-10-CM | POA: Diagnosis not present

## 2021-10-17 ENCOUNTER — Encounter: Payer: Self-pay | Admitting: Internal Medicine

## 2021-10-17 ENCOUNTER — Non-Acute Institutional Stay: Payer: Medicare PPO | Admitting: Internal Medicine

## 2021-10-17 DIAGNOSIS — K529 Noninfective gastroenteritis and colitis, unspecified: Secondary | ICD-10-CM

## 2021-10-17 DIAGNOSIS — M79671 Pain in right foot: Secondary | ICD-10-CM

## 2021-10-17 DIAGNOSIS — H00011 Hordeolum externum right upper eyelid: Secondary | ICD-10-CM | POA: Diagnosis not present

## 2021-10-17 DIAGNOSIS — I480 Paroxysmal atrial fibrillation: Secondary | ICD-10-CM | POA: Diagnosis not present

## 2021-10-17 DIAGNOSIS — I10 Essential (primary) hypertension: Secondary | ICD-10-CM

## 2021-10-17 NOTE — Progress Notes (Signed)
Location:   [place] Room Number: 676 Place of Service:  SNF [phone]) Provider:  Veleta Miners MD  [name], MD  Patient Care Team: [name], MD as PCP - General (Internal Medicine) [facility], [place] Donovan, NP as Nurse Practitioner (Geriatric Medicine) Adrian Prows, MD as Consulting Physician (Cardiology) Earlean Polka, MD as Consulting Physician (Ophthalmology)  Extended Emergency Contact Information Primary Emergency Contact: [name] of [place] Phone: [phone] Work Phone: [phone] Relation: Son Secondary Emergency Contact: [facility] Mobile Phone: [phone] Relation: Friend  Code Status:  DNR Goals of care: Advanced Directive information Advanced Directives 10/17/2021  Does Patient Have a Medical Advance Directive? Yes  Type of Paramedic of [place];Living will;Out of facility DNR (pink MOST or yellow form)  Does patient want to make changes to medical advance directive? No - Patient declined  Copy of [place] in Chart? Yes - validated most recent copy scanned in chart (See row information)  Pre-existing out of facility DNR order (yellow form or pink MOST form) Yellow form placed in chart (order not valid for inpatient use)     Chief Complaint  Patient presents with   Acute Visit    Pain    HPI:  Pt is a 88 y.o. female seen today for an acute visit for Right Foot Pain  Patient has a history of PAF, HLD, chronic venous insufficiency, left carotid stenosis and hypertension  Today seen in her room for Right foot pain She is c/o Pain in top of her foot especially on the side. Going on for past few weeks No Fall or Injury that she is aware of Amy did Lasix to help with her edema but has not helped her pain Pain is more when she stands up but then gets better Doesn't bother her when she is not put weight in it  Her  other issue was c/o mushy stools and has to go bathroom many times a day No Abdominal pain or cramps No Nausea or vomiting Is eating more per the son due to being in AL    Past Medical History:  Diagnosis Date   Actinic keratosis 09/23/2012   Anal fissure and fistula 07/28/2008   Arthralgia of temporomandibular joint 12/30/2009   Atrial fibrillation ([place])    Atrial tachycardia ([place])    s/p ablation 2008. [facility] (R free wall Atach)   Atrophy of vulva 09/23/2012   Blepharochalasis 03/01/2011   Cardiac dysrhythmia, unspecified 11/10/2009   Dermatophytosis of nail 02/28/2008   Dizziness and giddiness 04/03/2007   Dysphagia, unspecified(787.20) 01/17/2012   Edema 11/21/2006   External hemorrhoids without mention of complication 27/51/7001   HTN (hypertension)    Hyperlipidemia    Insomnia, unspecified 11/18/2004   Internal hemorrhoids without mention of complication 7/49/4496   Left bundle branch hemiblock    Loss of weight 03/05/2012   Myalgia and myositis, unspecified 01/05/2009   Orthostatic hypotension 03/22/2007   Other malaise and fatigue 03/05/2012   Pain in joint, site unspecified 02/28/2008   Palpitations 11/21/2006   Personality change due to conditions classified elsewhere    Pterygium eye, right 06/07/2020   Rectocele 09/23/2012   Restless legs syndrome (RLS)    Rosacea 09/23/2012   Sebaceous cyst 03/01/2011   Thumb pain 11/20/2012   Unspecified hereditary and idiopathic peripheral neuropathy    Past Surgical History:  Procedure Laterality Date   BLADDER SUSPENSION  1998   CATARACT EXTRACTION W/  INTRAOCULAR LENS IMPLANT Right 4/8//2008   Dr. Charise Killian   catheter ablation  2008   for atrial tachycardia   CYST EXCISION  1998   mucus cyst removal from finger   PTERYGIUM EXCISION  06/07/2020   TOTAL ABDOMINAL HYSTERECTOMY  2001   uterine bleeding    Allergies  Allergen Reactions   Erythromycin Nausea And Vomiting   Flecainide Acetate     Postural hypotension   Penicillins      Allergies as of 10/17/2021       Reactions   Erythromycin Nausea And Vomiting   Flecainide Acetate    Postural hypotension   Penicillins         Medication List        Accurate as of October 17, 2021 10:06 AM. If you have any questions, ask your nurse or doctor.          acetaminophen 500 MG tablet Commonly known as: TYLENOL Take 500 mg by mouth every 8 (eight) hours as needed.   amLODipine 2.5 MG tablet Commonly known as: NORVASC Take 5 mg by mouth daily. What changed: Another medication with the same name was removed. Continue taking this medication, and follow the directions you see here. Changed by: [name], MD   cholecalciferol 25 MCG (1000 UNIT) tablet Commonly known as: VITAMIN D3 Take 1,000 Units by mouth daily.   cycloSPORINE 0.05 % ophthalmic emulsion Commonly known as: RESTASIS Place 1 drop into both eyes in the morning, at noon, in the evening, and at bedtime.   diltiazem 60 MG tablet Commonly known as: Cardizem Take 1 tablet (60 mg total) by mouth 3 (three) times daily as needed (For heart rate >110 bpm).   furosemide 20 MG tablet Commonly known as: LASIX TAKE 1 TABLET ONCE DAILY AS NEEDED FOR SWELLING- TAKE 1/2 TABLET FOR LEG SWELLING AS NEEDED.   hydrALAZINE 10 MG tablet Commonly known as: APRESOLINE Take 10 mg by mouth 3 (three) times daily.   hydrALAZINE 25 MG tablet Commonly known as: APRESOLINE Take 1 tablet (25 mg total) by mouth 3 (three) times daily.   ofloxacin 0.3 % ophthalmic solution Commonly known as: OCUFLOX Place 1 drop into the right eye 4 (four) times daily.   PRESERVISION AREDS PO Take 2 tablets by mouth daily.   Robafen DM Cough 10-100 MG/5ML liquid Generic drug: dextromethorphan-guaiFENesin Take 10 mLs by mouth every 6 (six) hours as needed for cough.   rosuvastatin 20 MG tablet Commonly known as: CRESTOR TAKE ONE TABLET BY MOUTH ONCE DAILY   sodium fluoride 1.1 % Gel dental gel Commonly known as:  FLUORISHIELD Place 1 application onto teeth at bedtime.   Xarelto 20 MG Tabs tablet Generic drug: rivaroxaban TAKE 1 TABLET ONCE DAILY WITH DINNER.        Review of Systems  Constitutional:  Negative for activity change and appetite change.  HENT: Negative.    Respiratory:  Negative for cough and shortness of breath.   Cardiovascular:  Negative for leg swelling.  Gastrointestinal:  Negative for constipation and diarrhea.  Genitourinary: Negative.   Musculoskeletal:  Positive for arthralgias. Negative for gait problem and myalgias.  Skin: Negative.   Neurological:  Negative for dizziness and weakness.  Psychiatric/Behavioral:  Negative for confusion, dysphoric mood and sleep disturbance.    Immunization History  Administered Date(s) Administered   Influenza Whole 06/12/2012   Influenza, High Dose Seasonal PF 05/23/2019, 06/11/2020   Influenza,inj,Quad PF,6+ Mos 05/13/2013, 06/07/2018   Influenza-Unspecified 05/27/2014, 04/24/2015, 04/13/2016, 04/24/2017, 06/11/2020,  06/11/2021   Moderna Covid-19 Vaccine Bivalent Booster 71yr & up 06/21/2021   Moderna SARS-COV2 Booster Vaccination 04/18/2021   Moderna Sars-Covid-2 Vaccination 08/26/2019, 09/23/2019, 06/29/2020   Pneumococcal Conjugate-13 05/27/2014   Pneumococcal Polysaccharide-23 08/14/1997   Td 04/15/2011   Tdap 07/15/2015   Zoster Recombinat (Shingrix) 02/12/2017, 06/28/2017   Zoster, Live 08/15/2007   Pertinent  Health Maintenance Due  Topic Date Due   INFLUENZA VACCINE  Completed   DEXA SCAN  Completed   Fall Risk 06/10/2020 11/08/2020 05/18/2021 07/20/2021 09/05/2021  Falls in the past year? 0 0 0 1 0  Was there an injury with Fall? 0 0 - 0 0  Was there an injury with Fall? - - - - -  Fall Risk Category Calculator 0 0 - 1 0  Fall Risk Category Low Low - Low Low  Patient Fall Risk Level Low fall risk Low fall risk Low fall risk Low fall risk Low fall risk  Patient at Risk for Falls Due to - - - History of fall(s) No  Fall Risks  Fall risk Follow up - Falls evaluation completed Falls evaluation completed Falls evaluation completed;Education provided;Falls prevention discussed Falls evaluation completed   Functional Status Survey:    Vitals:   10/17/21 0957  BP: 121/68  Pulse: 65  Resp: 18  Temp: (!) 97.5 F (36.4 C)  SpO2: 98%  Weight: 143 lb 12.8 oz (65.2 kg)  Height: '5\' 4"'$  (1.626 m)   Body mass index is 24.68 kg/m. Physical Exam Vitals reviewed.  Constitutional:      Appearance: Normal appearance.  HENT:     Head: Normocephalic.     Nose: Nose normal.     Mouth/Throat:     Mouth: Mucous membranes are moist.     Pharynx: Oropharynx is clear.  Eyes:     Pupils: Pupils are equal, round, and reactive to light.  Cardiovascular:     Rate and Rhythm: Normal rate and regular rhythm.     Pulses: Normal pulses.     Heart sounds: Normal heart sounds. No murmur heard. Pulmonary:     Effort: Pulmonary effort is normal.     Breath sounds: Normal breath sounds.  Abdominal:     General: Abdomen is flat. Bowel sounds are normal.     Palpations: Abdomen is soft.  Musculoskeletal:     Cervical back: Neck supple.     Comments: Has chronic Venous changes bilateral with Mild edema  Also on top of her Right foot lateral side there was some tenderness and swelling but no redness or warmth  Skin:    General: Skin is warm.  Neurological:     General: No focal deficit present.     Mental Status: She is alert and oriented to person, place, and time.  Psychiatric:        Mood and Affect: Mood normal.        Thought Content: Thought content normal.    Labs reviewed: Recent Labs    10/28/20 0000 07/20/21 0929 07/25/21 0000  NA 140 141 143  K 3.6 3.4* 4.3  CL 104 99 104  CO2 23* 28 27*  GLUCOSE  --  119*  --   BUN 26* 34* 13  CREATININE 0.7 1.34* 0.7  CALCIUM 9.3 9.3 9.1   No results for input(s): AST, ALT, ALKPHOS, BILITOT, PROT, ALBUMIN in the last 8760 hours. Recent Labs     10/28/20 0000  WBC 7.0  HGB 13.1  HCT 38  PLT 254  Lab Results  Component Value Date   TSH 3.19 04/20/2020   TSH 3.19 04/20/2020   No results found for: HGBA1C Lab Results  Component Value Date   CHOL 160 04/22/2019   HDL 59 04/22/2019   LDLCALC 87 04/22/2019   TRIG 73 04/22/2019    Significant Diagnostic Results in last 30 days:  No results found.  Assessment/Plan 1. Right foot pain Xray ordered She does not want anything stronger for pain  2. Frequent stools Xray of Abdomen to rule out Constipation  3 Right Eye Stye On Ocuflox and warm compresses 4 PAF On Xarelto 5 Hypertension On Norvasc and Hydralazine Sometimes staff is holding hr hydralazine due to Low BP   Family/ staff Communication:   Labs/tests ordered:

## 2021-10-18 DIAGNOSIS — M79672 Pain in left foot: Secondary | ICD-10-CM | POA: Diagnosis not present

## 2021-10-18 DIAGNOSIS — M79671 Pain in right foot: Secondary | ICD-10-CM | POA: Diagnosis not present

## 2021-10-18 DIAGNOSIS — K5900 Constipation, unspecified: Secondary | ICD-10-CM | POA: Diagnosis not present

## 2021-10-18 DIAGNOSIS — M7671 Peroneal tendinitis, right leg: Secondary | ICD-10-CM | POA: Diagnosis not present

## 2021-10-19 DIAGNOSIS — M6389 Disorders of muscle in diseases classified elsewhere, multiple sites: Secondary | ICD-10-CM | POA: Diagnosis not present

## 2021-10-19 DIAGNOSIS — R278 Other lack of coordination: Secondary | ICD-10-CM | POA: Diagnosis not present

## 2021-10-19 DIAGNOSIS — Z9181 History of falling: Secondary | ICD-10-CM | POA: Diagnosis not present

## 2021-10-19 DIAGNOSIS — R2681 Unsteadiness on feet: Secondary | ICD-10-CM | POA: Diagnosis not present

## 2021-10-27 DIAGNOSIS — Z9181 History of falling: Secondary | ICD-10-CM | POA: Diagnosis not present

## 2021-10-27 DIAGNOSIS — R41841 Cognitive communication deficit: Secondary | ICD-10-CM | POA: Diagnosis not present

## 2021-10-27 DIAGNOSIS — R42 Dizziness and giddiness: Secondary | ICD-10-CM | POA: Diagnosis not present

## 2021-10-27 DIAGNOSIS — F070 Personality change due to known physiological condition: Secondary | ICD-10-CM | POA: Diagnosis not present

## 2021-10-28 DIAGNOSIS — Z9181 History of falling: Secondary | ICD-10-CM | POA: Diagnosis not present

## 2021-10-28 DIAGNOSIS — R41841 Cognitive communication deficit: Secondary | ICD-10-CM | POA: Diagnosis not present

## 2021-10-28 DIAGNOSIS — R42 Dizziness and giddiness: Secondary | ICD-10-CM | POA: Diagnosis not present

## 2021-10-28 DIAGNOSIS — F070 Personality change due to known physiological condition: Secondary | ICD-10-CM | POA: Diagnosis not present

## 2021-10-31 ENCOUNTER — Non-Acute Institutional Stay: Payer: Medicare PPO | Admitting: Adult Health

## 2021-10-31 ENCOUNTER — Encounter: Payer: Self-pay | Admitting: Adult Health

## 2021-10-31 DIAGNOSIS — R051 Acute cough: Secondary | ICD-10-CM

## 2021-10-31 DIAGNOSIS — R059 Cough, unspecified: Secondary | ICD-10-CM | POA: Diagnosis not present

## 2021-10-31 DIAGNOSIS — H1033 Unspecified acute conjunctivitis, bilateral: Secondary | ICD-10-CM | POA: Diagnosis not present

## 2021-10-31 MED ORDER — LEVALBUTEROL HCL 0.63 MG/3ML IN NEBU: 0.6300 mg | INHALATION_SOLUTION | Freq: Two times a day (BID) | RESPIRATORY_TRACT | 0 refills | Status: DC

## 2021-10-31 MED ORDER — GENTAMICIN SULFATE 0.3 % OP SOLN: 2.0000 [drp] | Freq: Two times a day (BID) | OPHTHALMIC | 0 refills | Status: DC

## 2021-10-31 NOTE — Progress Notes (Signed)
?Location:  [place] ?  ?Place of Service:  ALF (13) ?Provider:   ?Cindi Carbon, ANP ?[place] ?[phone] ? ? ?[name], MD ? ?Patient Care Team: ?[name], MD as PCP - General (Internal Medicine) ?[facility] ?Mardene Celeste, NP as Nurse Practitioner (Geriatric Medicine) ?Adrian Prows, MD as Consulting Physician (Cardiology) ?Earlean Polka, MD as Consulting Physician (Ophthalmology) ? ?Extended Emergency Contact Information ?Primary Emergency Contact: Kulzer,Stephen ? [place] ?Home Phone: [phone] ?Work Phone: [phone] ?Relation: Son ?Secondary Emergency Contact: DOYLE,ANN ?Mobile Phone: [phone] ?Relation: Friend ? ?Code Status:  DNR ?Goals of care: Advanced Directive information ?Advanced Directives 10/17/2021  ?Does Patient Have a Medical Advance Directive? Yes  ?Type of Paramedic of [place];Living will;Out of facility DNR (pink MOST or yellow form)  ?Does patient want to make changes to medical advance directive? No - Patient declined  ?Copy of [place] in Chart? Yes - validated most recent copy scanned in chart (See row information)  ?Pre-existing out of facility DNR order (yellow form or pink MOST form) Yellow form placed in chart (order not valid for inpatient use)  ? ? ? ?Chief Complaint  ?Patient presents with  ? Acute Visit  ?  cough  ? ? ?HPI:  ?Pt is a 88 y.o. female seen today for an acute visit for cough for 5-7 days. She has been using cepacol and robitussin with small benefit. Nurse reports her cough is getting worse in frequency and now she has abnormal lung sounds. Pt denies sob but nurse reports some appearance of dyspnea with exertion . Appetite is reduced. Temp 99.7. Sats 92%. NAD. Rapid covid done 3/17 which was negative. No known sick contacts. Denies body aches or sore throat. No urinary or GI symptoms. Mainly cough and congestion are  bothersome and getting worse. She also says she had eye irritation and redness for a week. She has tried warm compresses. No visual changes.  ? ? ?Past Medical History:  ?Diagnosis Date  ? Actinic keratosis 09/23/2012  ? 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 Arthralgia of temporomandibular joint 12/30/2009  ? Atrial fibrillation ([place])   ? Atrial tachycardia ([place])   ? s/p ablation 2008. [facility] (R free wall Atach)  ? Atrophy of vulva 09/23/2012  ? Blepharochalasis 03/01/2011  ? Cardiac dysrhythmia, unspecified 11/10/2009  ? Dermatophytosis of nail 02/28/2008  ? Dizziness and giddiness 04/03/2007  ? Dysphagia, unspecified(787.20) 01/17/2012  ? Edema 11/21/2006  ? External hemorrhoids without mention of complication 28/31/5176  ? HTN (hypertension)   ? Hyperlipidemia   ? Insomnia, unspecified 11/18/2004  ? Internal hemorrhoids without mention of complication 1/60/7371  ? Left bundle branch hemiblock   ? Loss of weight 03/05/2012  ? Myalgia and myositis, unspecified 01/05/2009  ? Orthostatic hypotension 03/22/2007  ? Other malaise and fatigue 03/05/2012  ? Pain in joint, site unspecified 02/28/2008  ? Palpitations 11/21/2006  ? Personality change due to conditions classified elsewhere   ? Pterygium eye, right 06/07/2020  ? Rectocele 09/23/2012  ? Restless legs syndrome (RLS)   ? Rosacea 09/23/2012  ? Sebaceous cyst 03/01/2011  ? Thumb pain 11/20/2012  ? Unspecified hereditary and idiopathic peripheral neuropathy   ? ?Past Surgical History:  ?Procedure Laterality Date  ? BLADDER SUSPENSION  1998  ? CATARACT EXTRACTION W/ INTRAOCULAR LENS IMPLANT Right 4/8//2008  ? Dr. Charise Killian  ? catheter ablation  2008  ? for atrial tachycardia  ? CYST EXCISION  1998  ? mucus cyst removal from finger  ? PTERYGIUM EXCISION  06/07/2020  ? TOTAL ABDOMINAL HYSTERECTOMY  2001  ? uterine bleeding  ? ? ?Allergies  ?Allergen Reactions  ? Erythromycin Nausea And Vomiting  ? Flecainide Acetate   ?  Postural hypotension  ? Penicillins   ? ? ?Outpatient Encounter Medications as  of 10/31/2021  ?Medication Sig  ? gentamicin (GARAMYCIN) 0.3 % ophthalmic solution Place 2 drops into both eyes in the morning and at bedtime.  ? levalbuterol (XOPENEX) 0.63 MG/3ML nebulizer solution Take 3 mLs (0.63 mg total) by nebulization in the morning and at bedtime for 3 days.  ? acetaminophen (TYLENOL) 500 MG tablet Take 500 mg by mouth every 8 (eight) hours as needed.  ? amiodarone (PACERONE) 200 MG tablet Take 200 mg by mouth daily.  ? amLODipine (NORVASC) 2.5 MG tablet Take 5 mg by mouth daily.  ? cholecalciferol (VITAMIN D3) 25 MCG (1000 UNIT) tablet Take 1,000 Units by mouth daily.  ? cycloSPORINE (RESTASIS) 0.05 % ophthalmic emulsion Place 1 drop into both eyes in the morning, at noon, in the evening, and at bedtime.  ? dextromethorphan-guaiFENesin (ROBAFEN DM COUGH) 10-100 MG/5ML liquid Take 10 mLs by mouth every 6 (six) hours as needed for cough.  ? diltiazem (CARDIZEM) 60 MG tablet Take 1 tablet (60 mg total) by mouth 3 (three) times daily as needed (For heart rate >110 bpm).  ? furosemide (LASIX) 20 MG tablet TAKE 1 TABLET ONCE DAILY AS NEEDED FOR SWELLING- TAKE 1/2 TABLET FOR LEG SWELLING AS NEEDED.  ? hydrALAZINE (APRESOLINE) 10 MG tablet Take 10 mg by mouth 3 (three) times daily as needed.  ? hydrALAZINE (APRESOLINE) 25 MG tablet Take 1 tablet (25 mg total) by mouth 3 (three) times daily.  ? Multiple Vitamins-Minerals (PRESERVISION AREDS PO) Take 2 tablets by mouth daily.   ? rosuvastatin (CRESTOR) 20 MG tablet TAKE ONE TABLET BY MOUTH ONCE DAILY  ? sodium fluoride (FLUORISHIELD) 1.1 % GEL dental gel Place 1 application onto teeth at bedtime.  ? XARELTO 20 MG TABS tablet TAKE 1 TABLET ONCE DAILY WITH DINNER.  ? ?No facility-administered encounter medications on file as of 10/31/2021.  ? ? ?Review of Systems  ?Constitutional:  Positive for activity change, appetite change, fatigue and fever.  ?HENT:  Positive for congestion. Negative for postnasal drip, rhinorrhea, sinus pressure, sore throat and  trouble swallowing.   ?Eyes:  Positive for pain, discharge, redness and visual disturbance.  ?Respiratory:  Positive for cough. Negative for shortness of breath, wheezing and stridor.   ?Cardiovascular:  Positive for leg swelling. Negative for chest pain and palpitations.  ?Gastrointestinal:  Negative for abdominal distention, constipation, diarrhea and nausea.  ?Genitourinary:  Negative for difficulty urinating and dysuria.  ?Musculoskeletal:  Positive for gait problem. Negative for arthralgias and back pain.  ?Neurological:  Negative for weakness.  ?Psychiatric/Behavioral:  Negative for agitation, behavioral problems and confusion.   ? ?Immunization History  ?Administered Date(s) Administered  ? Influenza Whole 06/12/2012  ? Influenza, High Dose Seasonal PF 05/23/2019, 06/11/2020  ? Influenza,inj,Quad PF,6+ Mos 05/13/2013, 06/07/2018  ? Influenza-Unspecified 05/27/2014, 04/24/2015, 04/13/2016, 04/24/2017, 06/11/2020, 06/11/2021  ? Moderna Covid-19 Vaccine Bivalent Booster 44yr & up 06/21/2021  ? Moderna SARS-COV2 Booster Vaccination 04/18/2021  ? Moderna Sars-Covid-2 Vaccination 08/26/2019, 09/23/2019, 06/29/2020  ? Pneumococcal Conjugate-13 05/27/2014  ? Pneumococcal Polysaccharide-23 08/14/1997  ? Td 04/15/2011  ? Tdap 07/15/2015  ? Zoster Recombinat (Shingrix) 02/12/2017, 06/28/2017  ? Zoster, Live 08/15/2007  ? ?Pertinent  Health  Maintenance Due  ?Topic Date Due  ? INFLUENZA VACCINE  Completed  ? DEXA SCAN  Completed  ? ?Fall Risk 06/10/2020 11/08/2020 05/18/2021 07/20/2021 09/05/2021  ?Falls in the past year? 0 0 0 1 0  ?Was there an injury with Fall? 0 0 - 0 0  ?Was there an injury with Fall? - - - - -  ?Fall Risk Category Calculator 0 0 - 1 0  ?Fall Risk Category Low Low - Low Low  ?Patient Fall Risk Level Low fall risk Low fall risk Low fall risk Low fall risk Low fall risk  ?Patient at Risk for Falls Due to - - - History of fall(s) No Fall Risks  ?Fall risk Follow up - Falls evaluation completed Falls  evaluation completed Falls evaluation completed;Education provided;Falls prevention discussed Falls evaluation completed  ? ?Functional Status Survey: ?  ? ?Vitals:  ? 10/31/21 1122  ?Resp: 20  ?Temp: 99.7 ?F (37.6 ?

## 2021-11-14 ENCOUNTER — Encounter: Payer: Medicare PPO | Admitting: Adult Health

## 2021-11-17 ENCOUNTER — Ambulatory Visit: Payer: Medicare PPO | Admitting: Cardiology

## 2021-11-18 ENCOUNTER — Ambulatory Visit: Payer: Medicare PPO | Admitting: Cardiology

## 2021-11-25 ENCOUNTER — Telehealth: Payer: Self-pay

## 2021-11-25 NOTE — Telephone Encounter (Signed)
[facility]ameds call and would like to know when will the paperwork they faxed over on 11/15/21 be filled out and returned. The paperwork is in [place] box waiting for completion.  ?

## 2021-11-28 NOTE — Telephone Encounter (Addendum)
Discussed with Paramed, provider in [employer]. Will return asap.  ?

## 2021-11-30 ENCOUNTER — Telehealth: Payer: Self-pay | Admitting: *Deleted

## 2021-11-30 NOTE — Telephone Encounter (Signed)
Dr. Lyndel Safe gave me the [place], Physician Cognitive History to fax back.  ? ?She completed and signed the form for Physician Cognitive History and the other paperwork attached she is taking back to [facility] to have nursing fill out.  ? ?Faxed to [facility] Fax: [phone] ? ?And Dr. Lyndel Safe requested to be faxed to [place] at [place] ? ?Sent copy to scanning.  ? ? ?

## 2021-12-01 ENCOUNTER — Telehealth: Payer: Self-pay

## 2021-12-01 NOTE — Telephone Encounter (Signed)
Tabitha from [facility] called due to pt medication. She mention that pt takes Hydralazine '25mg'$  TID, the doctor does not want to give her the Hydralazine when pt pulse is 58. But if pt does not take the hydralazine then pt bp gets high. Lawerance Bach wants a letter to be fax stating not to hold the hydralazine until pulse is in the low 40. Please advise  ?

## 2021-12-01 NOTE — Telephone Encounter (Signed)
Hydralazine does not effect heart rate. Can you please get a contact number for her and I can call to get some clarification. Thanks!

## 2021-12-01 NOTE — Telephone Encounter (Signed)
I did the paper work and gave it to [employer] ?

## 2021-12-05 DIAGNOSIS — D6869 Other thrombophilia: Secondary | ICD-10-CM | POA: Diagnosis not present

## 2021-12-05 DIAGNOSIS — E785 Hyperlipidemia, unspecified: Secondary | ICD-10-CM | POA: Diagnosis not present

## 2021-12-05 DIAGNOSIS — R531 Weakness: Secondary | ICD-10-CM | POA: Diagnosis not present

## 2021-12-05 DIAGNOSIS — E782 Mixed hyperlipidemia: Secondary | ICD-10-CM | POA: Diagnosis not present

## 2021-12-05 LAB — BASIC METABOLIC PANEL
BUN: 19 (ref 4–21)
BUN: 19 (ref 4–21)
CO2: 25 — AB (ref 13–22)
Chloride: 104 (ref 99–108)
Creatinine: 0.9 (ref 0.5–1.1)
Glucose: 94
Potassium: 4 mEq/L (ref 3.5–5.1)
Sodium: 142 (ref 137–147)

## 2021-12-05 LAB — HEPATIC FUNCTION PANEL
ALT: 16 U/L (ref 7–35)
AST: 22 (ref 13–35)
Alkaline Phosphatase: 64 (ref 25–125)
Bilirubin, Direct: 0.2 (ref 0.01–0.4)
Bilirubin, Total: 0.3

## 2021-12-05 LAB — CBC AND DIFFERENTIAL
HCT: 35 — AB (ref 36–46)
Hemoglobin: 11.7 — AB (ref 12.0–16.0)
Platelets: 249 10*3/uL (ref 150–400)
WBC: 4.3

## 2021-12-05 LAB — COMPREHENSIVE METABOLIC PANEL
Albumin: 3.9 (ref 3.5–5.0)
Calcium: 8.7 (ref 8.7–10.7)
Globulin: 1.8

## 2021-12-05 LAB — CBC: RBC: 3.85 — AB (ref 3.87–5.11)

## 2021-12-05 LAB — TSH: TSH: 3.94 (ref 0.41–5.90)

## 2021-12-07 ENCOUNTER — Encounter: Payer: Self-pay | Admitting: Internal Medicine

## 2021-12-07 ENCOUNTER — Telehealth: Payer: Self-pay

## 2021-12-07 ENCOUNTER — Non-Acute Institutional Stay: Payer: Medicare PPO | Admitting: Internal Medicine

## 2021-12-07 VITALS — BP 138/68 | HR 52 | Temp 97.7°F | Ht 64.0 in | Wt 144.6 lb

## 2021-12-07 DIAGNOSIS — M79671 Pain in right foot: Secondary | ICD-10-CM | POA: Diagnosis not present

## 2021-12-07 DIAGNOSIS — I10 Essential (primary) hypertension: Secondary | ICD-10-CM

## 2021-12-07 DIAGNOSIS — E7800 Pure hypercholesterolemia, unspecified: Secondary | ICD-10-CM

## 2021-12-07 DIAGNOSIS — G3184 Mild cognitive impairment, so stated: Secondary | ICD-10-CM

## 2021-12-07 DIAGNOSIS — R600 Localized edema: Secondary | ICD-10-CM

## 2021-12-07 DIAGNOSIS — I480 Paroxysmal atrial fibrillation: Secondary | ICD-10-CM

## 2021-12-07 DIAGNOSIS — K5900 Constipation, unspecified: Secondary | ICD-10-CM

## 2021-12-07 NOTE — Telephone Encounter (Signed)
Incoming call received from Yvone Neu with [facility] stating the long-term care form received is incomplete and he will refax for Dr.Gupta to complete a section that she missed regarding patients performance of specific ADL's. ? ?FYI, once fax is received it will be printed and placed in Dr.Gupta's mailbox for further review and completion. ? ? ? ?

## 2021-12-08 NOTE — Progress Notes (Signed)
? ?Location:  [facility] ?  ?Place of Service:  Clinic (12) ? ?Provider:  ? ?Code Status: DNR ?Goals of Care:  ? ?  12/07/2021  ?  9:59 AM  ?Advanced Directives  ?Does Patient Have a Medical Advance Directive? Yes  ?Type of [facility];Living will;Out of facility DNR (pink MOST or yellow form)  ?Does patient want to make changes to medical advance directive? No - Patient declined  ?Copy of [place] in Chart? Yes - validated most recent copy scanned in chart (See row information)  ?Pre-existing out of facility DNR order (yellow form or pink MOST form) Yellow form placed in chart (order not valid for inpatient use)  ? ? ? ?Chief Complaint  ?Patient presents with  ? Medical Management of Chronic Issues  ?  Patient returns to the clinic for her 3 month follow up. She would like to discuss her right upper ankle pain in the mornings and digestion/hard stools.   ? ? ?HPI: Patient is a 88 y.o. female seen today for medical management of chronic diseases.   ? ?Patient has a history of PAF, HLD, chronic venous insufficiency, left carotid stenosis and hypertension ? ?Now lives in [place] ? ?Right foot pain ?Xray done few weeks ago showed Fracture deformities of multiple Metatarsals.  Due to old fractures. ?Patient described that when she gets up in the morning she has the pain but the pain gets better as the day goes by.  She said that she had it injected before and would be interested in that again. ?Constipation ?Complaining of constipation x-ray done few weeks ago also showed moderate fecal load. ? ?Note she is also was complaining that sometimes her hydralazine is held at night and that makes her blood pressure spike in the morning. ?Advised patient continues to do well walk with her walker gets help for her medicines.  Socializes with her friends in [place].  Is able to swim. ?Wt Readings from Last 3 Encounters:  ?12/07/21 144 lb 9.6 oz (65.6 kg)  ?10/17/21  143 lb 12.8 oz (65.2 kg)  ?10/11/21 141 lb (64 kg)  ?  ?  ?Past Medical History:  ?Diagnosis Date  ? Actinic keratosis 09/23/2012  ? 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 Arthralgia of temporomandibular joint 12/30/2009  ? Atrial fibrillation ([place])   ? Atrial tachycardia ([facility])   ? s/p ablation 2008. [facility] (R free wall Atach)  ? Atrophy of vulva 09/23/2012  ? Blepharochalasis 03/01/2011  ? Cardiac dysrhythmia, unspecified 11/10/2009  ? Dermatophytosis of nail 02/28/2008  ? Dizziness and giddiness 04/03/2007  ? Dysphagia, unspecified(787.20) 01/17/2012  ? Edema 11/21/2006  ? External hemorrhoids without mention of complication 61/95/0932  ? HTN (hypertension)   ? Hyperlipidemia   ? Insomnia, unspecified 11/18/2004  ? Internal hemorrhoids without mention of complication 6/71/2458  ? Left bundle branch hemiblock   ? Loss of weight 03/05/2012  ? Myalgia and myositis, unspecified 01/05/2009  ? Orthostatic hypotension 03/22/2007  ? Other malaise and fatigue 03/05/2012  ? Pain in joint, site unspecified 02/28/2008  ? Palpitations 11/21/2006  ? Personality change due to conditions classified elsewhere   ? Pterygium eye, right 06/07/2020  ? Rectocele 09/23/2012  ? Restless legs syndrome (RLS)   ? Rosacea 09/23/2012  ? Sebaceous cyst 03/01/2011  ? Thumb pain 11/20/2012  ? Unspecified hereditary and idiopathic peripheral neuropathy   ? ? ?Past Surgical History:  ?Procedure Laterality Date  ? BLADDER SUSPENSION  1998  ?  CATARACT EXTRACTION W/ INTRAOCULAR LENS IMPLANT Right 4/8//2008  ? Dr. Charise Killian  ? catheter ablation  2008  ? for atrial tachycardia  ? CYST EXCISION  1998  ? mucus cyst removal from finger  ? PTERYGIUM EXCISION  06/07/2020  ? TOTAL ABDOMINAL HYSTERECTOMY  2001  ? uterine bleeding  ? ? ?Allergies  ?Allergen Reactions  ? Erythromycin Nausea And Vomiting  ? Flecainide Acetate   ?  Postural hypotension  ? Penicillins   ? ? ?Outpatient Encounter Medications as of 12/07/2021  ?Medication Sig  ? acetaminophen (TYLENOL) 500 MG tablet Take 500 mg by  mouth every 8 (eight) hours as needed.  ? amLODipine (NORVASC) 2.5 MG tablet Take 5 mg by mouth daily.  ? cholecalciferol (VITAMIN D3) 25 MCG (1000 UNIT) tablet Take 1,000 Units by mouth daily.  ? dextromethorphan-guaiFENesin (ROBAFEN DM COUGH) 10-100 MG/5ML liquid Take 10 mLs by mouth every 6 (six) hours as needed for cough.  ? diltiazem (CARDIZEM) 60 MG tablet Take 1 tablet (60 mg total) by mouth 3 (three) times daily as needed (For heart rate >110 bpm).  ? furosemide (LASIX) 20 MG tablet TAKE 1 TABLET ONCE DAILY AS NEEDED FOR SWELLING- TAKE 1/2 TABLET FOR LEG SWELLING AS NEEDED.  ? hydrALAZINE (APRESOLINE) 10 MG tablet Take 10 mg by mouth 3 (three) times daily as needed.  ? hydrALAZINE (APRESOLINE) 25 MG tablet Take 1 tablet (25 mg total) by mouth 3 (three) times daily.  ? levalbuterol (XOPENEX) 0.63 MG/3ML nebulizer solution Take 3 mLs (0.63 mg total) by nebulization in the morning and at bedtime for 3 days.  ? Multiple Vitamins-Minerals (PRESERVISION AREDS PO) Take 2 tablets by mouth daily.   ? polyethylene glycol (MIRALAX / GLYCOLAX) 17 g packet Take 17 g by mouth daily as needed.  ? rosuvastatin (CRESTOR) 20 MG tablet TAKE ONE TABLET BY MOUTH ONCE DAILY  ? senna-docusate (SENOKOT-S) 8.6-50 MG tablet Take 2 tablets by mouth at bedtime.  ? sodium fluoride (FLUORISHIELD) 1.1 % GEL dental gel Place 1 application onto teeth at bedtime.  ? XARELTO 20 MG TABS tablet TAKE 1 TABLET ONCE DAILY WITH DINNER.  ? [DISCONTINUED] amiodarone (PACERONE) 200 MG tablet Take 200 mg by mouth daily.  ? [DISCONTINUED] cycloSPORINE (RESTASIS) 0.05 % ophthalmic emulsion Place 1 drop into both eyes in the morning, at noon, in the evening, and at bedtime.  ? [DISCONTINUED] gentamicin (GARAMYCIN) 0.3 % ophthalmic solution Place 2 drops into both eyes in the morning and at bedtime.  ? ?No facility-administered encounter medications on file as of 12/07/2021.  ? ? ?Review of Systems:  ?Review of Systems  ?Constitutional:  Negative for  activity change and appetite change.  ?HENT: Negative.    ?Respiratory:  Negative for cough and shortness of breath.   ?Cardiovascular:  Positive for leg swelling.  ?Gastrointestinal:  Negative for constipation.  ?Genitourinary: Negative.   ?Musculoskeletal:  Positive for arthralgias, gait problem and myalgias.  ?Skin: Negative.   ?Neurological:  Negative for dizziness and weakness.  ?Psychiatric/Behavioral:  Positive for confusion. Negative for dysphoric mood and sleep disturbance.   ? ?Health Maintenance  ?Topic Date Due  ? INFLUENZA VACCINE  03/14/2022  ? TETANUS/TDAP  07/14/2025  ? Pneumonia Vaccine 23+ Years old  Completed  ? DEXA SCAN  Completed  ? COVID-19 Vaccine  Completed  ? Zoster Vaccines- Shingrix  Completed  ? HPV VACCINES  Aged Out  ? ? ?Physical Exam: ?Vitals:  ? 12/07/21 0943  ?BP: 138/68  ?Pulse: (!) 52  ?Temp:  97.7 ?F (36.5 ?C)  ?SpO2: 97%  ?Weight: 144 lb 9.6 oz (65.6 kg)  ?Height: '5\' 4"'$  (1.626 m)  ? ?Body mass index is 24.82 kg/m?Marland Kitchen ?Physical Exam ?Vitals reviewed.  ?Constitutional:   ?   Appearance: Normal appearance.  ?HENT:  ?   Head: Normocephalic.  ?   Nose: Nose normal.  ?   Mouth/Throat:  ?   Mouth: Mucous membranes are moist.  ?   Pharynx: Oropharynx is clear.  ?Eyes:  ?   Pupils: Pupils are equal, round, and reactive to light.  ?Cardiovascular:  ?   Rate and Rhythm: Normal rate and regular rhythm.  ?   Pulses: Normal pulses.  ?   Heart sounds: Normal heart sounds. No murmur heard. ?Pulmonary:  ?   Effort: Pulmonary effort is normal.  ?   Breath sounds: Normal breath sounds.  ?Abdominal:  ?   General: Abdomen is flat. Bowel sounds are normal.  ?   Palpations: Abdomen is soft.  ?Musculoskeletal:  ?   Cervical back: Neck supple.  ?   Comments: Chronic Venous changes in LE  ?Skin: ?   General: Skin is warm.  ?Neurological:  ?   General: No focal deficit present.  ?   Mental Status: She is alert and oriented to person, place, and time.  ?Psychiatric:     ?   Mood and Affect: Mood normal.      ?   Thought Content: Thought content normal.  ? ? ?Labs reviewed: ?Basic Metabolic Panel: ?Recent Labs  ?  07/20/21 ?0929 07/25/21 ?0000 12/05/21 ?0000  ?NA 141 143 142  ?K 3.4* 4.3 4.0  ?CL 99 104 104  ?CO

## 2021-12-09 NOTE — Telephone Encounter (Addendum)
Currently unable to locate form. Confirmed not currently in fax queue.  ?

## 2021-12-10 IMAGING — CR DG LUMBAR SPINE COMPLETE 4+V
5 series · 5 of 5 positions shown · non-contrast
Comparison: CT abdomen 05/15/2007

CLINICAL DATA: Low back and left hip pain

EXAM:
LUMBAR SPINE - COMPLETE 4+ VIEW

[t l-spine a.p.]
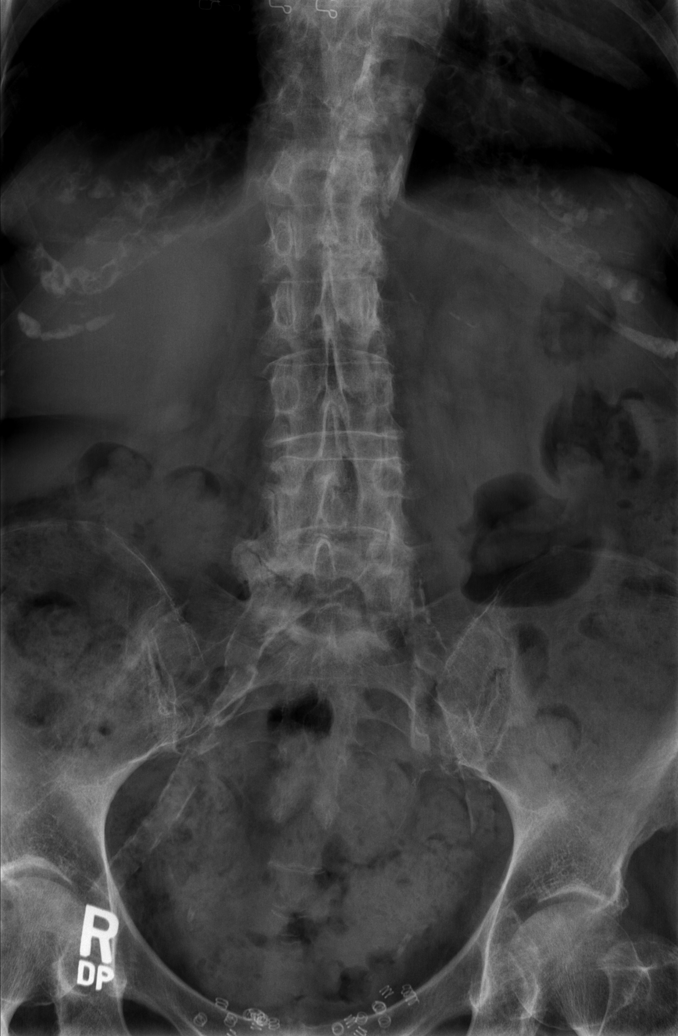

[t l-spine oblique exposure (1 of 2)]
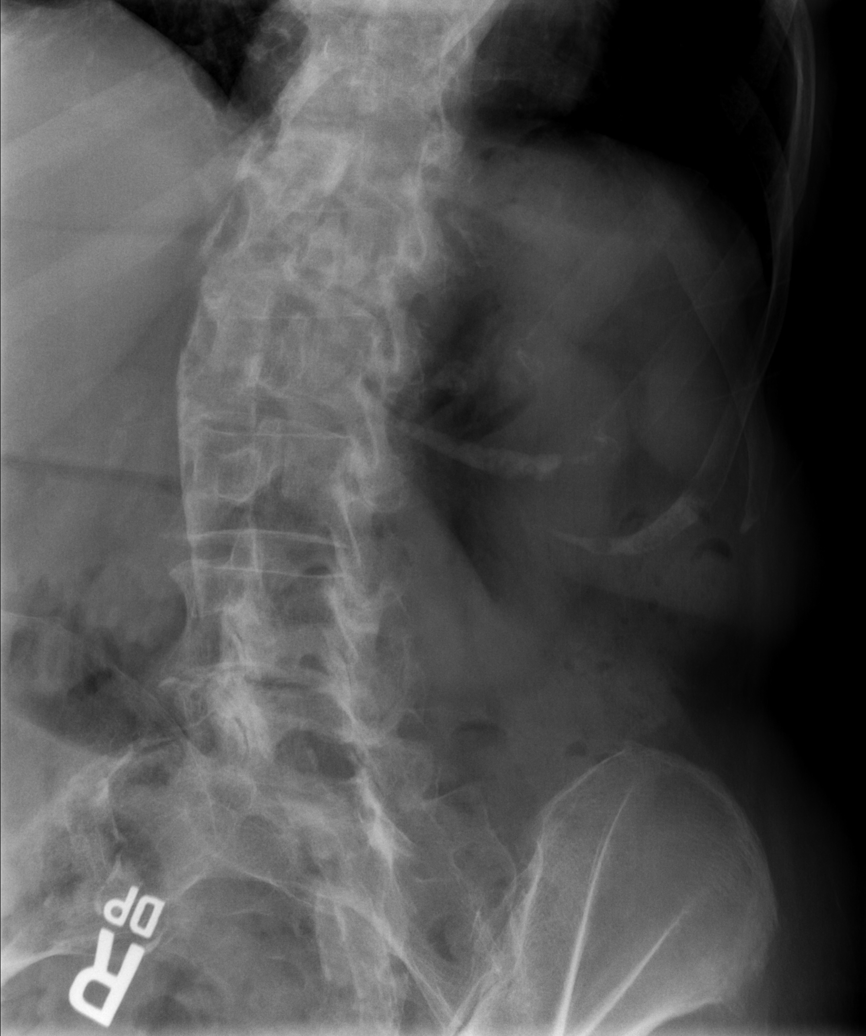

[t l-spine oblique exposure (2 of 2)]
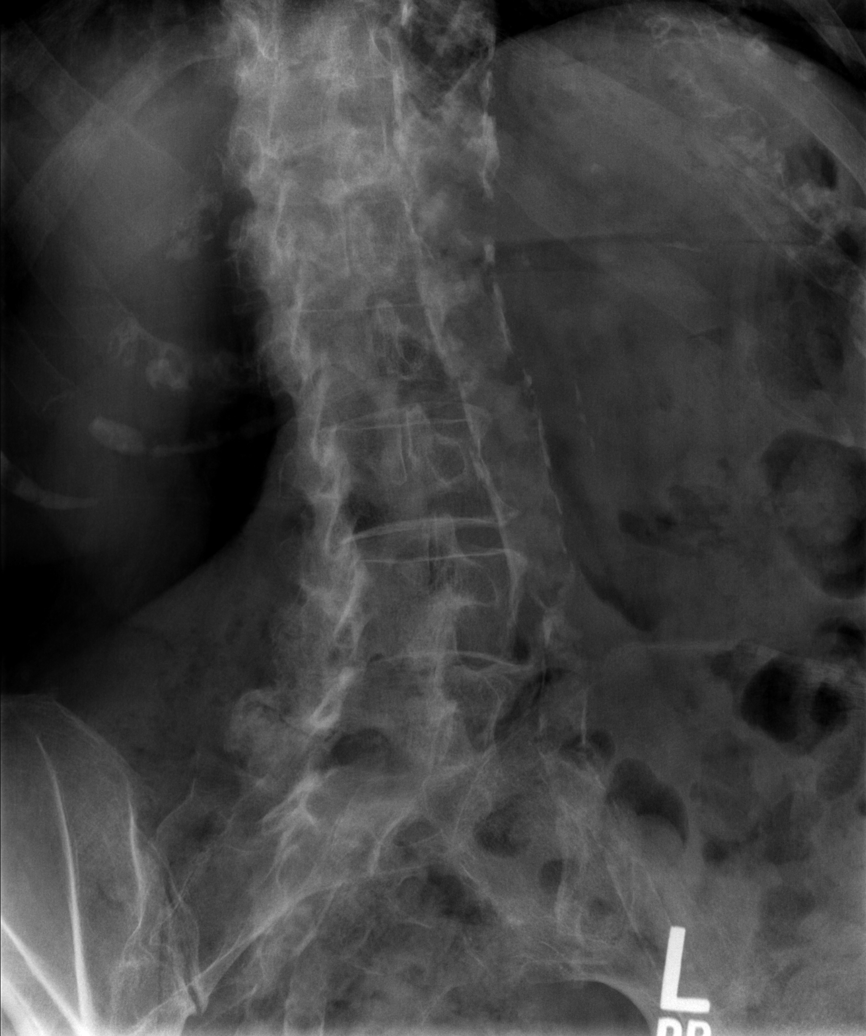

[t l-spine lat]
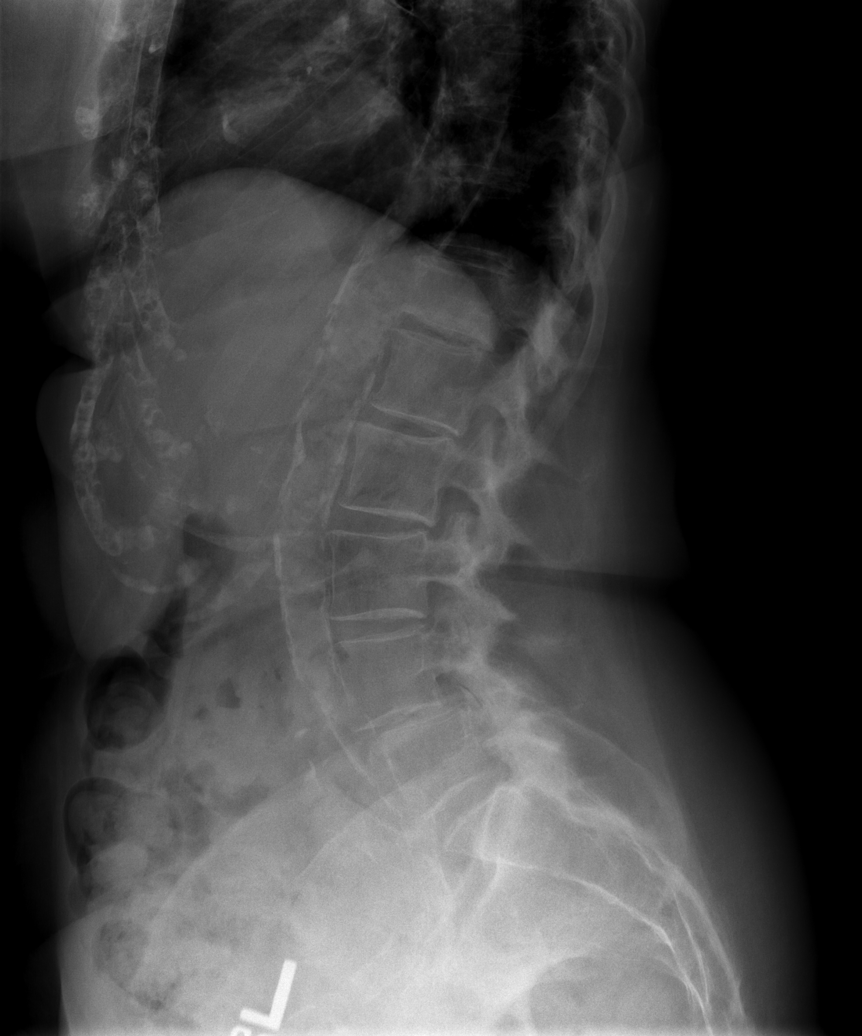

[t l-spine l5-s1 spot]
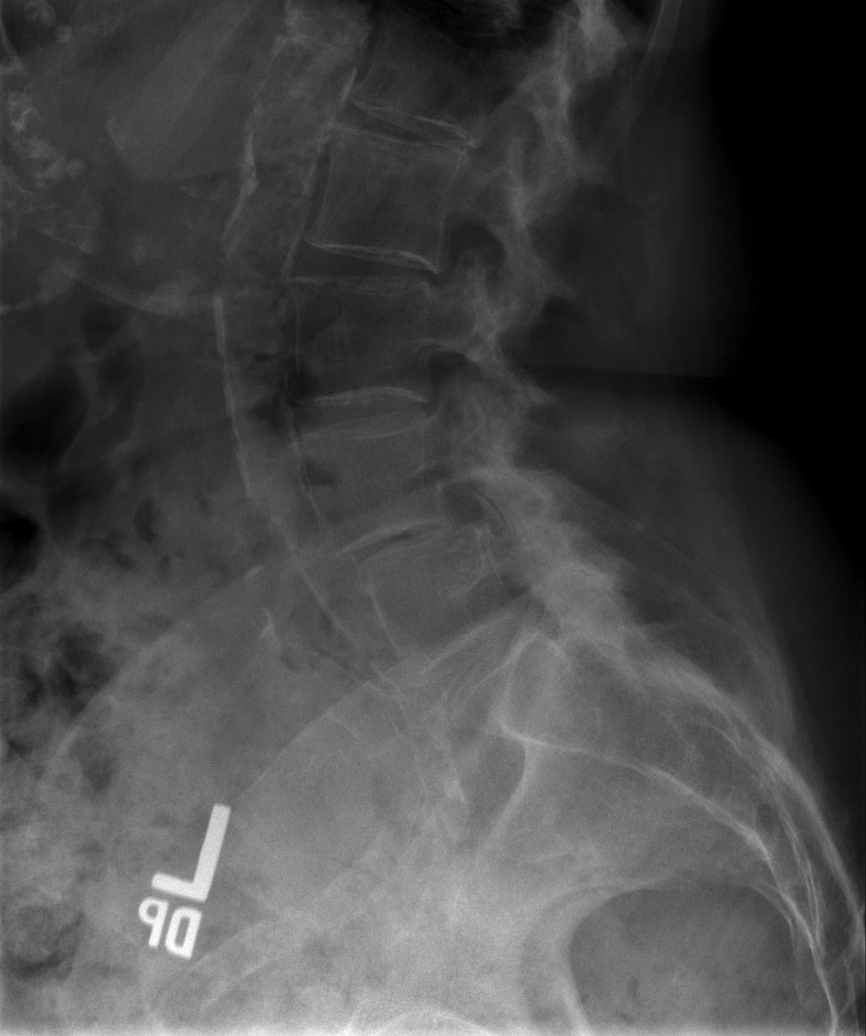

[5 of 5 positions shown; findings below may reference images not displayed]

FINDINGS: Substantial degenerative facet arthropathy bilaterally at L4-5 and
L5-S1.

There 0.9 cm of anterolisthesis at L4-5 which is presumably
degenerative in nature. No definite pars defects.

3 mm degenerative retrolisthesis at L1-2 and L2-3.

Mild inferior endplate wedge compression at T12, age indeterminate.

Dense aortoiliac atherosclerotic vascular calcification.
IMPRESSION: 1. Substantial degenerative facet arthropathy bilaterally at L4-5
and L5-S1.
2. Grade 2 anterolisthesis at L4-5 with grade 1 retrolisthesis at
L1-2 and L2-3.
3. Age-indeterminate inferior wedge compression at T12 with about is
20% loss of height.
4.  Aortic Atherosclerosis ([id number]).

## 2021-12-12 NOTE — Telephone Encounter (Signed)
Form was finally received and after reviewing it appeared that all sections were completed. I called [phone] to inquire exactly ws appears to be missing on their end. ? ?I was told that Section 2 where Dr.Gupta selected hands on needed for transferring we need to indicate frequency of assistance half time or more by selecting yes or no beside it. ? ?I gave a verbal of yes based on the response to C. (See form scanned)  ? ?The representative stated they will escalate processing   ?

## 2021-12-13 ENCOUNTER — Telehealth: Payer: Self-pay

## 2021-12-13 NOTE — Telephone Encounter (Signed)
Home health Nurse Tabitha called and stated 128-118 systolic, at 8pm. ? ? ?8am: Amiodarone, hydralazine, and Amlodipine ?11am : Hydralazine ?2pm : Hydralazine ?8pm : Hydralizine ? ?If the BP is less than 867 systolic, the night shift will not administer Hydralazine. But then, when the morning shift nurse comes in, patients BP is always atleast 737-366 systolic.  ? ?She is asking if you want her stop the 2pm dose, because they are afraid patient will wake up at night to go the bathroom 3-4 times, and may have a fall, as she has had in the past. ? ?[id number] ?

## 2021-12-13 NOTE — Telephone Encounter (Signed)
Fax order to Nursing Home with instructions to discontinue 2pm dose of Hydralazine. ? ?Attention Marcia/Tabitha ?Fax # [phone]

## 2021-12-13 NOTE — Telephone Encounter (Signed)
That will be fine. 

## 2021-12-16 DIAGNOSIS — E785 Hyperlipidemia, unspecified: Secondary | ICD-10-CM | POA: Diagnosis not present

## 2021-12-16 NOTE — Telephone Encounter (Signed)
Called facility, spoke with Ermalinda Barrios, she received the order I faxed over.

## 2021-12-19 ENCOUNTER — Ambulatory Visit (INDEPENDENT_AMBULATORY_CARE_PROVIDER_SITE_OTHER): Payer: Medicare PPO | Admitting: Family

## 2021-12-19 ENCOUNTER — Encounter: Payer: Self-pay | Admitting: Family

## 2021-12-19 ENCOUNTER — Telehealth: Payer: Self-pay

## 2021-12-19 DIAGNOSIS — Z0000 Encounter for general adult medical examination without abnormal findings: Secondary | ICD-10-CM

## 2021-12-19 NOTE — Patient Instructions (Signed)
Rebekah Graham , ?Thank you for taking time to come for your Medicare Wellness Visit. I appreciate your ongoing commitment to your health goals. Please review the following plan we discussed and let me know if I can assist you in the future.  ? ?Screening recommendations/referrals: ?Colonoscopy N/A  ?Mammogram :N/A ?Bone Density : Up to date  ?Recommended yearly ophthalmology/optometry visit for glaucoma screening and checkup ?Recommended yearly dental visit for hygiene and checkup ? ?Vaccinations: ?Influenza vaccine : Up to date  ?Pneumococcal vaccine : Up to date  ?Tdap vaccine : Up to date  ?Shingles vaccine : Up to date    ? ?Advanced directives: Yes  ? ?Conditions/risks identified: Advance age female > 54 yrs old,family History of premature cardiovascular disease ,Hypertension  ? ?Next appointment: 1 year  ? ? ?Preventive Care 29 Years and Older, Female ?Preventive care refers to lifestyle choices and visits with your health care provider that can promote health and wellness. ?What does preventive care include? ?A yearly physical exam. This is also called an annual well check. ?Dental exams once or twice a year. ?Routine eye exams. Ask your health care provider how often you should have your eyes checked. ?Personal lifestyle choices, including: ?Daily care of your teeth and gums. ?Regular physical activity. ?Eating a healthy diet. ?Avoiding tobacco and drug use. ?Limiting alcohol use. ?Practicing safe sex. ?Taking low-dose aspirin every day. ?Taking vitamin and mineral supplements as recommended by your health care provider. ?What happens during an annual well check? ?The services and screenings done by your health care provider during your annual well check will depend on your age, overall health, lifestyle risk factors, and family history of disease. ?Counseling  ?Your health care provider may ask you questions about your: ?Alcohol use. ?Tobacco use. ?Drug use. ?Emotional well-being. ?Home and relationship  well-being. ?Sexual activity. ?Eating habits. ?History of falls. ?Memory and ability to understand (cognition). ?Work and work Statistician. ?Reproductive health. ?Screening  ?You may have the following tests or measurements: ?Height, weight, and BMI. ?Blood pressure. ?Lipid and cholesterol levels. These may be checked every 5 years, or more frequently if you are over 59 years old. ?Skin check. ?Lung cancer screening. You may have this screening every year starting at age 53 if you have a 30-pack-year history of smoking and currently smoke or have quit within the past 15 years. ?Fecal occult blood test (FOBT) of the stool. You may have this test every year starting at age 75. ?Flexible sigmoidoscopy or colonoscopy. You may have a sigmoidoscopy every 5 years or a colonoscopy every 10 years starting at age 78. ?Hepatitis C blood test. ?Hepatitis B blood test. ?Sexually transmitted disease (STD) testing. ?Diabetes screening. This is done by checking your blood sugar (glucose) after you have not eaten for a while (fasting). You may have this done every 1-3 years. ?Bone density scan. This is done to screen for osteoporosis. You may have this done starting at age 43. ?Mammogram. This may be done every 1-2 years. Talk to your health care provider about how often you should have regular mammograms. ?Talk with your health care provider about your test results, treatment options, and if necessary, the need for more tests. ?Vaccines  ?Your health care provider may recommend certain vaccines, such as: ?Influenza vaccine. This is recommended every year. ?Tetanus, diphtheria, and acellular pertussis (Tdap, Td) vaccine. You may need a Td booster every 10 years. ?Zoster vaccine. You may need this after age 9. ?Pneumococcal 13-valent conjugate (PCV13) vaccine. One dose is recommended  after age 76. ?Pneumococcal polysaccharide (PPSV23) vaccine. One dose is recommended after age 96. ?Talk to your health care provider about which  screenings and vaccines you need and how often you need them. ?This information is not intended to replace advice given to you by your health care provider. Make sure you discuss any questions you have with your health care provider. ?Document Released: 08/27/2015 Document Revised: 04/19/2016 Document Reviewed: 06/01/2015 ?Elsevier Interactive Patient Education ? 2017 [place]. ? ?Fall Prevention in the Home ?Falls can cause injuries. They can happen to people of all ages. There are many things you can do to make your home safe and to help prevent falls. ?What can I do on the outside of my home? ?Regularly fix the edges of walkways and driveways and fix any cracks. ?Remove anything that might make you trip as you walk through a door, such as a raised step or threshold. ?Trim any bushes or trees on the path to your home. ?Use bright outdoor lighting. ?Clear any walking paths of anything that might make someone trip, such as rocks or tools. ?Regularly check to see if handrails are loose or broken. Make sure that both sides of any steps have handrails. ?Any raised decks and porches should have guardrails on the edges. ?Have any leaves, snow, or ice cleared regularly. ?Use sand or salt on walking paths during winter. ?Clean up any spills in your garage right away. This includes oil or grease spills. ?What can I do in the bathroom? ?Use night lights. ?Install grab bars by the toilet and in the tub and shower. Do not use towel bars as grab bars. ?Use non-skid mats or decals in the tub or shower. ?If you need to sit down in the shower, use a plastic, non-slip stool. ?Keep the floor dry. Clean up any water that spills on the floor as soon as it happens. ?Remove soap buildup in the tub or shower regularly. ?Attach bath mats securely with double-sided non-slip rug tape. ?Do not have throw rugs and other things on the floor that can make you trip. ?What can I do in the bedroom? ?Use night lights. ?Make sure that you have a  light by your bed that is easy to reach. ?Do not use any sheets or blankets that are too big for your bed. They should not hang down onto the floor. ?Have a firm chair that has side arms. You can use this for support while you get dressed. ?Do not have throw rugs and other things on the floor that can make you trip. ?What can I do in the kitchen? ?Clean up any spills right away. ?Avoid walking on wet floors. ?Keep items that you use a lot in easy-to-reach places. ?If you need to reach something above you, use a strong step stool that has a grab bar. ?Keep electrical cords out of the way. ?Do not use floor polish or wax that makes floors slippery. If you must use wax, use non-skid floor wax. ?Do not have throw rugs and other things on the floor that can make you trip. ?What can I do with my stairs? ?Do not leave any items on the stairs. ?Make sure that there are handrails on both sides of the stairs and use them. Fix handrails that are broken or loose. Make sure that handrails are as long as the stairways. ?Check any carpeting to make sure that it is firmly attached to the stairs. Fix any carpet that is loose or worn. ?Avoid having throw  rugs at the top or bottom of the stairs. If you do have throw rugs, attach them to the floor with carpet tape. ?Make sure that you have a light switch at the top of the stairs and the bottom of the stairs. If you do not have them, ask someone to add them for you. ?What else can I do to help prevent falls? ?Wear shoes that: ?Do not have high heels. ?Have rubber bottoms. ?Are comfortable and fit you well. ?Are closed at the toe. Do not wear sandals. ?If you use a stepladder: ?Make sure that it is fully opened. Do not climb a closed stepladder. ?Make sure that both sides of the stepladder are locked into place. ?Ask someone to hold it for you, if possible. ?Clearly mark and make sure that you can see: ?Any grab bars or handrails. ?First and last steps. ?Where the edge of each step  is. ?Use tools that help you move around (mobility aids) if they are needed. These include: ?Canes. ?Walkers. ?Scooters. ?Crutches. ?Turn on the lights when you go into a dark area. Replace any light bulbs as soon as

## 2021-12-19 NOTE — Progress Notes (Signed)
This service is provided via telemedicine ? ?No vital signs collected/recorded due to the encounter was a telemedicine visit.  ? ?Location of patient (ex: home, work):  home ? ?Patient consents to a telephone visit:  yes see telephone consent dated 12/19/21 ? ?Location of the provider (ex: office, home):  [facility] ? ?Name of any referring provider:  [name], MD ? ? ?Names of all persons participating in the telemedicine service and their role in the encounter:  Melissa Pickett CCMA, [place]; Marlowe Sax, NP, patient and AL Nurse.  ? ?Time spent on call:  16 min with medical assistants  ? ? ? ? ?Subjective:  ? Rebekah Graham is a 88 y.o. female who presents for Medicare Annual (Subsequent) preventive examination. ? ?Review of Systems    ? ?Cardiac Risk Factors include: hypertension;family history of premature cardiovascular disease;advanced age (>90mn, >>34women) ? ?   ?Objective:  ?  ?There were no vitals filed for this visit. ?There is no height or weight on file to calculate BMI. ? ? ?  12/19/2021  ?  9:28 AM 12/07/2021  ?  9:59 AM 10/17/2021  ? 10:05 AM 10/11/2021  ?  9:44 AM 09/05/2021  ?  2:13 PM 08/04/2021  ?  4:34 PM 08/01/2021  ?  2:42 PM  ?Advanced Directives  ?Does Patient Have a Medical Advance Directive? Yes Yes Yes Yes Yes Yes Yes  ?Type of AParamedicof [place] will;Out of facility DNR (pink MOST or yellow form) HStacey StreetLiving will;Out of facility DNR (pink MOST or yellow form) [place] will;Out of facility DNR (pink MOST or yellow form) [place] will;Out of facility DNR (pink MOST or yellow form) Living will;Out of facility DNR (pink MOST or yellow form) Living will;Out of facility DNR (pink MOST or yellow form) Living will;Out of facility DNR (pink MOST or yellow form)  ?Does patient want to make changes to medical advance directive? No - Patient declined No -  Patient declined No - Patient declined No - Patient declined No - Patient declined No - Patient declined No - Patient declined  ?Copy of [place] Chart? Yes - validated most recent copy scanned in chart (See row information) Yes - validated most recent copy scanned in chart (See row information) Yes - validated most recent copy scanned in chart (See row information) Yes - validated most recent copy scanned in chart (See row information)     ?Pre-existing out of facility DNR order (yellow form or pink MOST form) Yellow form placed in chart (order not valid for inpatient use) Yellow form placed in chart (order not valid for inpatient use) Yellow form placed in chart (order not valid for inpatient use) Yellow form placed in chart (order not valid for inpatient use)     ? ? ?Current Medications (verified) ?Outpatient Encounter Medications as of 12/19/2021  ?Medication Sig  ? acetaminophen (TYLENOL) 500 MG tablet Take 500 mg by mouth every 8 (eight) hours as needed.  ? amiodarone (PACERONE) 200 MG tablet Take 200 mg by mouth daily.  ? amLODipine (NORVASC) 2.5 MG tablet Take 5 mg by mouth daily.  ? Artificial Tear Solution (GENTEAL TEARS OP) Apply 1 drop to eye as needed.  ? cholecalciferol (VITAMIN D3) 25 MCG (1000 UNIT) tablet Take 1,000 Units by mouth daily.  ? dextromethorphan-guaiFENesin (ROBAFEN DM COUGH) 10-100 MG/5ML liquid Take 10 mLs by mouth every 6 (six) hours  as needed for cough.  ? diltiazem (CARDIZEM) 60 MG tablet Take 1 tablet (60 mg total) by mouth 3 (three) times daily as needed (For heart rate >110 bpm).  ? furosemide (LASIX) 20 MG tablet TAKE 1 TABLET ONCE DAILY AS NEEDED FOR SWELLING- TAKE 1/2 TABLET FOR LEG SWELLING AS NEEDED.  ? hydrALAZINE (APRESOLINE) 10 MG tablet Take 10 mg by mouth 3 (three) times daily as needed.  ? hydrALAZINE (APRESOLINE) 25 MG tablet Take 25 mg by mouth 2 (two) times daily.  ? levalbuterol (XOPENEX) 0.63 MG/3ML nebulizer solution Take 0.63 mg by  nebulization 2 (two) times daily as needed for wheezing or shortness of breath.  ? Multiple Vitamins-Minerals (MULTIVITAMIN ADULTS 50+) TABS Take 1 tablet by mouth daily.  ? Multiple Vitamins-Minerals (PRESERVISION AREDS PO) Take 2 tablets by mouth daily.   ? polyethylene glycol (MIRALAX / GLYCOLAX) 17 g packet Take 17 g by mouth daily as needed (and 1/2 up in the morning scheduled).  ? rosuvastatin (CRESTOR) 20 MG tablet TAKE ONE TABLET BY MOUTH ONCE DAILY  ? senna-docusate (SENOKOT-S) 8.6-50 MG tablet Take 2 tablets by mouth at bedtime.  ? sodium fluoride (FLUORISHIELD) 1.1 % GEL dental gel Place 1 application onto teeth at bedtime.  ? XARELTO 20 MG TABS tablet TAKE 1 TABLET ONCE DAILY WITH DINNER.  ? [DISCONTINUED] hydrALAZINE (APRESOLINE) 25 MG tablet Take 1 tablet (25 mg total) by mouth 3 (three) times daily.  ? ?No facility-administered encounter medications on file as of 12/19/2021.  ? ? ?Allergies (verified) ?Erythromycin, Flecainide acetate, and Penicillins  ? ?History: ?Past Medical History:  ?Diagnosis Date  ? Actinic keratosis 09/23/2012  ? 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 Arthralgia of temporomandibular joint 12/30/2009  ? Atrial fibrillation ([place])   ? Atrial tachycardia ([place])   ? s/p ablation 2008. [facility] (R free wall Atach)  ? Atrophy of vulva 09/23/2012  ? Blepharochalasis 03/01/2011  ? Cardiac dysrhythmia, unspecified 11/10/2009  ? Dermatophytosis of nail 02/28/2008  ? Dizziness and giddiness 04/03/2007  ? Dysphagia, unspecified(787.20) 01/17/2012  ? Edema 11/21/2006  ? External hemorrhoids without mention of complication 09/32/6712  ? HTN (hypertension)   ? Hyperlipidemia   ? Insomnia, unspecified 11/18/2004  ? Internal hemorrhoids without mention of complication 4/58/0998  ? Left bundle branch hemiblock   ? Loss of weight 03/05/2012  ? Myalgia and myositis, unspecified 01/05/2009  ? Orthostatic hypotension 03/22/2007  ? Other malaise and fatigue 03/05/2012  ? Pain in joint, site unspecified 02/28/2008  ?  Palpitations 11/21/2006  ? Personality change due to conditions classified elsewhere   ? Pterygium eye, right 06/07/2020  ? Rectocele 09/23/2012  ? Restless legs syndrome (RLS)   ? Rosacea 09/23/2012  ? Sebaceous cyst 03/01/2011  ? Thumb pain 11/20/2012  ? Unspecified hereditary and idiopathic peripheral neuropathy   ? ?Past Surgical History:  ?Procedure Laterality Date  ? BLADDER SUSPENSION  1998  ? CATARACT EXTRACTION W/ INTRAOCULAR LENS IMPLANT Right 4/8//2008  ? Dr. Charise Killian  ? catheter ablation  2008  ? for atrial tachycardia  ? CYST EXCISION  1998  ? mucus cyst removal from finger  ? PTERYGIUM EXCISION  06/07/2020  ? TOTAL ABDOMINAL HYSTERECTOMY  2001  ? uterine bleeding  ? ?Family History  ?Problem Relation Age of Onset  ? Heart disease Mother   ? Heart disease Father   ? Cancer Sister   ?     breast  ? ?Social History  ? ?Socioeconomic History  ? Marital status: Widowed  ?  Spouse name: Not on file  ? Number of children: 3  ? Years of education: Not on file  ? Highest education level: Not on file  ?Occupational History  ? Occupation: Retired Pharmacist, hospital  ?  Employer: [employer]  ?Tobacco Use  ? Smoking status: Never  ? Smokeless tobacco: Never  ? Tobacco comments:  ?  tobacco  - none  ?Vaping Use  ? Vaping Use: Never used  ?Substance and Sexual Activity  ? Alcohol use: Yes  ?  Comment: 1-2 glasses of wine/gin daily   ? Drug use: No  ? Sexual activity: Never  ?Other Topics Concern  ? Not on file  ?Social History Narrative  ? 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 POA, DNR  ? Married -husband moved to skill unit 07/2014  ? Water aerobic, ACES for exercise, walking  ? Wine 1 glass nightly  ? Never smoked  ? ?Social Determinants of Health  ? ?Financial Resource Strain: Not on file  ?Food Insecurity: Not on file  ?Transportation Needs: Not on file  ?Physical Activity: Not on file  ?Stress: Not on file  ?Social Connections: Not on file  ? ? ?Tobacco Counseling ?Counseling given: Not Answered ?Tobacco  comments: tobacco  - none ? ? ?Clinical Intake: ? ?Pre-visit preparation completed: No ? ?Pain : No/denies pain ? ?  ? ?BMI - recorded: 24.68 ?Nutritional Status: BMI of 19-24  Normal ?Nutritional Risks: None ?Diabetes: No ? ?[place]

## 2021-12-19 NOTE — Telephone Encounter (Signed)
[name], doke are scheduled for a virtual visit with your provider today.   ? ?Just as we do with appointments in the office, we must obtain your consent to participate.  Your consent will be active for this visit and any virtual visit you may have with one of our providers in the next 365 days.   ? ?If you have a MyChart account, I can also send a copy of this consent to you electronically.  All virtual visits are billed to your insurance company just like a traditional visit in the office.  As this is a virtual visit, video technology does not allow for your provider to perform a traditional examination.  This may limit your provider's ability to fully assess your condition.  If your provider identifies any concerns that need to be evaluated in person or the need to arrange testing such as labs, EKG, etc, we will make arrangements to do so.   ? ?Although advances in technology are sophisticated, we cannot ensure that it will always work on either your end or our end.  If the connection with a video visit is poor, we may have to switch to a telephone visit.  With either a video or telephone visit, we are not always able to ensure that we have a secure connection.   I need to obtain your verbal consent now.   Are you willing to proceed with your visit today?  ? ?Rebekah Graham has provided verbal consent on 12/19/2021 for a virtual visit (video or telephone). ? ? ?[name], [place] ?12/19/2021  11:22 AM ? ? ?

## 2021-12-26 ENCOUNTER — Encounter: Payer: Self-pay | Admitting: Adult Health

## 2021-12-26 ENCOUNTER — Non-Acute Institutional Stay: Payer: Medicare PPO | Admitting: Adult Health

## 2021-12-26 DIAGNOSIS — W19XXXA Unspecified fall, initial encounter: Secondary | ICD-10-CM

## 2021-12-26 DIAGNOSIS — S8002XA Contusion of left knee, initial encounter: Secondary | ICD-10-CM

## 2021-12-26 DIAGNOSIS — M25562 Pain in left knee: Secondary | ICD-10-CM | POA: Diagnosis not present

## 2021-12-26 NOTE — Progress Notes (Signed)
?Location:  [facility] ?  ?Place of Service:  ALF (13) ?Provider:   ?Cindi Carbon, ANP ?[place] ?([phone] ? ? ?[name], MD ? ?Patient Care Team: ?[name], MD as PCP - General (Internal Medicine) ?[facility] ?Mardene Celeste, NP as Nurse Practitioner (Geriatric Medicine) ?Adrian Prows, MD as Consulting Physician (Cardiology) ?Earlean Polka, MD as Consulting Physician (Ophthalmology) ? ?Extended Emergency Contact Information ?Primary Emergency Contact: Ellington,Stephen ? [place] ?Home Phone: [phone] ?Work Phone: [phone] ?Relation: Son ?Secondary Emergency Contact: DOYLE,ANN ?Mobile Phone: [phone] ?Relation: Friend ? ?Code Status:  DNR ?Goals of care: Advanced Directive information ? ?  12/19/2021  ?  9:28 AM  ?Advanced Directives  ?Does Patient Have a Medical Advance Directive? Yes  ?Type of Paramedic of [place];Living will;Out of facility DNR (pink MOST or yellow form)  ?Does patient want to make changes to medical advance directive? No - Patient declined  ?Copy of [place] in Chart? Yes - validated most recent copy scanned in chart (See row information)  ?Pre-existing out of facility DNR order (yellow form or pink MOST form) Yellow form placed in chart (order not valid for inpatient use)  ? ? ? ?Chief Complaint  ?Patient presents with  ? Acute Visit  ?  Fall ?  ? ? ?HPI:  ?Rebekah Graham is a 88 y.o. female seen today for an acute visit for fall.  Nurse reports she fell two days ago tripping on a strip between hardwood and carpet in her apt. She fell on the left side and down on her left knee. She has had pain 7/10 to the left knee, some relief with tylenol. Mild swelling. She did not twist the knee. Did not hear a pop. Currently on xarelto for CVA risk reduction.  ?She is able to ambulate on the left leg but switches some weight on the right. Has a walker and was using it  when she fell. Did not hit her head.  ? ?Left knee xray 2 view 12/26/21 showed soft tissue swelling. No fracture. Degenerative changes.  ? ? ?Past Medical History:  ?Diagnosis Date  ? Actinic keratosis 09/23/2012  ? 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 Arthralgia of temporomandibular joint 12/30/2009  ? Atrial fibrillation ([place])   ? Atrial tachycardia ([place])   ? s/p ablation 2008. [facility] (R free wall Atach)  ? Atrophy of vulva 09/23/2012  ? Blepharochalasis 03/01/2011  ? Cardiac dysrhythmia, unspecified 11/10/2009  ? Dermatophytosis of nail 02/28/2008  ? Dizziness and giddiness 04/03/2007  ? Dysphagia, unspecified(787.20) 01/17/2012  ? Edema 11/21/2006  ? External hemorrhoids without mention of complication 38/18/2993  ? HTN (hypertension)   ? Hyperlipidemia   ? Insomnia, unspecified 11/18/2004  ? Internal hemorrhoids without mention of complication 02/26/9677  ? Left bundle branch hemiblock   ? Loss of weight 03/05/2012  ? Myalgia and myositis, unspecified 01/05/2009  ? Orthostatic hypotension 03/22/2007  ? Other malaise and fatigue 03/05/2012  ? Pain in joint, site unspecified 02/28/2008  ? Palpitations 11/21/2006  ? Personality change due to conditions classified elsewhere   ? Pterygium eye, right 06/07/2020  ? Rectocele 09/23/2012  ? Restless legs syndrome (RLS)   ? Rosacea 09/23/2012  ? Sebaceous cyst 03/01/2011  ? Thumb pain 11/20/2012  ? Unspecified hereditary and idiopathic peripheral neuropathy   ? ?Past Surgical History:  ?Procedure Laterality Date  ? BLADDER SUSPENSION  1998  ? CATARACT EXTRACTION W/ INTRAOCULAR LENS IMPLANT Right  4/8//2008  ? Dr. Charise Killian  ? catheter ablation  2008  ? for atrial tachycardia  ? CYST EXCISION  1998  ? mucus cyst removal from finger  ? PTERYGIUM EXCISION  06/07/2020  ? TOTAL ABDOMINAL HYSTERECTOMY  2001  ? uterine bleeding  ? ? ?Allergies  ?Allergen Reactions  ? Erythromycin Nausea And Vomiting  ? Flecainide Acetate   ?  Postural hypotension  ? Penicillins   ? ? ?Outpatient Encounter Medications as of  12/26/2021  ?Medication Sig  ? acetaminophen (TYLENOL) 500 MG tablet Take 500 mg by mouth every 8 (eight) hours as needed.  ? amiodarone (PACERONE) 200 MG tablet Take 200 mg by mouth daily.  ? amLODipine (NORVASC) 2.5 MG tablet Take 5 mg by mouth daily.  ? Artificial Tear Solution (GENTEAL TEARS OP) Apply 1 drop to eye as needed.  ? cholecalciferol (VITAMIN D3) 25 MCG (1000 UNIT) tablet Take 1,000 Units by mouth daily.  ? dextromethorphan-guaiFENesin (ROBAFEN DM COUGH) 10-100 MG/5ML liquid Take 10 mLs by mouth every 6 (six) hours as needed for cough.  ? diltiazem (CARDIZEM) 60 MG tablet Take 1 tablet (60 mg total) by mouth 3 (three) times daily as needed (For heart rate >110 bpm).  ? furosemide (LASIX) 20 MG tablet TAKE 1 TABLET ONCE DAILY AS NEEDED FOR SWELLING- TAKE 1/2 TABLET FOR LEG SWELLING AS NEEDED.  ? hydrALAZINE (APRESOLINE) 10 MG tablet Take 10 mg by mouth 3 (three) times daily as needed.  ? hydrALAZINE (APRESOLINE) 25 MG tablet Take 25 mg by mouth 2 (two) times daily.  ? levalbuterol (XOPENEX) 0.63 MG/3ML nebulizer solution Take 0.63 mg by nebulization 2 (two) times daily as needed for wheezing or shortness of breath.  ? Multiple Vitamins-Minerals (MULTIVITAMIN ADULTS 50+) TABS Take 1 tablet by mouth daily.  ? Multiple Vitamins-Minerals (PRESERVISION AREDS PO) Take 2 tablets by mouth daily.   ? polyethylene glycol (MIRALAX / GLYCOLAX) 17 g packet Take 17 g by mouth daily as needed (and 1/2 up in the morning scheduled).  ? rosuvastatin (CRESTOR) 20 MG tablet TAKE ONE TABLET BY MOUTH ONCE DAILY  ? senna-docusate (SENOKOT-S) 8.6-50 MG tablet Take 2 tablets by mouth at bedtime.  ? sodium fluoride (FLUORISHIELD) 1.1 % GEL dental gel Place 1 application onto teeth at bedtime.  ? XARELTO 20 MG TABS tablet TAKE 1 TABLET ONCE DAILY WITH DINNER.  ? ?No facility-administered encounter medications on file as of 12/26/2021.  ? ? ?Review of Systems  ?Constitutional:  Positive for activity change. Negative for appetite  change, chills, diaphoresis, fatigue, fever and unexpected weight change.  ?HENT:  Negative for congestion.   ?Respiratory:  Negative for cough, shortness of breath and wheezing.   ?Cardiovascular:  Negative for chest pain, palpitations and leg swelling.  ?Gastrointestinal:  Negative for abdominal distention, abdominal pain, constipation and diarrhea.  ?Genitourinary:  Negative for difficulty urinating and dysuria.  ?Musculoskeletal:  Positive for gait problem and joint swelling. Negative for arthralgias, back pain and myalgias.  ?     Left knee pain  ?Neurological:  Negative for dizziness, tremors, seizures, syncope, facial asymmetry, speech difficulty, weakness, light-headedness, numbness and headaches.  ?Psychiatric/Behavioral:  Negative for agitation, behavioral problems and confusion.   ? ?Immunization History  ?Administered Date(s) Administered  ? Influenza Whole 06/12/2012  ? Influenza, High Dose Seasonal PF 05/23/2019, 06/11/2020  ? Influenza,inj,Quad PF,6+ Mos 05/13/2013, 06/07/2018  ? Influenza-Unspecified 05/27/2014, 04/24/2015, 04/13/2016, 04/24/2017, 06/11/2020, 06/11/2021  ? Moderna Covid-19 Vaccine Bivalent Booster 76yr & up 06/21/2021  ? Moderna SARS-COV2  Booster Vaccination 04/18/2021  ? Moderna Sars-Covid-2 Vaccination 08/26/2019, 09/23/2019, 06/29/2020  ? Pneumococcal Conjugate-13 05/27/2014  ? Pneumococcal Polysaccharide-23 08/14/1997  ? Td 04/15/2011  ? Tdap 07/15/2015  ? Zoster Recombinat (Shingrix) 02/12/2017, 06/28/2017  ? Zoster, Live 08/15/2007  ? ?Pertinent  Health Maintenance Due  ?Topic Date Due  ? INFLUENZA VACCINE  03/14/2022  ? DEXA SCAN  Completed  ? ? ?  05/18/2021  ? 10:44 AM 07/20/2021  ?  8:42 AM 09/05/2021  ?  2:14 PM 12/07/2021  ? 10:00 AM 12/19/2021  ? 11:07 AM  ?Fall Risk  ?Falls in the past year? 0 1 0 0 0  ?Was there an injury with Fall?  0 0 0 0  ?Fall Risk Category Calculator  1 0 0 0  ?Fall Risk Category  Low Low Low Low  ?Patient Fall Risk Level Low fall risk Low fall risk  Low fall risk Low fall risk Low fall risk  ?Patient at Risk for Falls Due to  History of fall(s) No Fall Risks No Fall Risks No Fall Risks  ?Fall risk Follow up Falls evaluation completed Falls evaluation compl

## 2021-12-27 DIAGNOSIS — L84 Corns and callosities: Secondary | ICD-10-CM | POA: Diagnosis not present

## 2021-12-27 DIAGNOSIS — M79672 Pain in left foot: Secondary | ICD-10-CM | POA: Diagnosis not present

## 2021-12-27 DIAGNOSIS — M79671 Pain in right foot: Secondary | ICD-10-CM | POA: Diagnosis not present

## 2021-12-27 DIAGNOSIS — B351 Tinea unguium: Secondary | ICD-10-CM | POA: Diagnosis not present

## 2022-01-18 DIAGNOSIS — H01002 Unspecified blepharitis right lower eyelid: Secondary | ICD-10-CM | POA: Diagnosis not present

## 2022-01-18 DIAGNOSIS — H02105 Unspecified ectropion of left lower eyelid: Secondary | ICD-10-CM | POA: Diagnosis not present

## 2022-01-18 DIAGNOSIS — H01005 Unspecified blepharitis left lower eyelid: Secondary | ICD-10-CM | POA: Diagnosis not present

## 2022-01-18 DIAGNOSIS — H02102 Unspecified ectropion of right lower eyelid: Secondary | ICD-10-CM | POA: Diagnosis not present

## 2022-03-14 DIAGNOSIS — M1990 Unspecified osteoarthritis, unspecified site: Secondary | ICD-10-CM | POA: Diagnosis not present

## 2022-03-14 DIAGNOSIS — I4891 Unspecified atrial fibrillation: Secondary | ICD-10-CM | POA: Diagnosis not present

## 2022-03-14 DIAGNOSIS — E785 Hyperlipidemia, unspecified: Secondary | ICD-10-CM | POA: Diagnosis not present

## 2022-03-14 DIAGNOSIS — I10 Essential (primary) hypertension: Secondary | ICD-10-CM | POA: Diagnosis not present

## 2022-03-14 DIAGNOSIS — H04129 Dry eye syndrome of unspecified lacrimal gland: Secondary | ICD-10-CM | POA: Diagnosis not present

## 2022-03-14 DIAGNOSIS — I4892 Unspecified atrial flutter: Secondary | ICD-10-CM | POA: Diagnosis not present

## 2022-03-14 DIAGNOSIS — D6869 Other thrombophilia: Secondary | ICD-10-CM | POA: Diagnosis not present

## 2022-03-14 DIAGNOSIS — K5900 Constipation, unspecified: Secondary | ICD-10-CM | POA: Diagnosis not present

## 2022-03-14 DIAGNOSIS — G3184 Mild cognitive impairment, so stated: Secondary | ICD-10-CM | POA: Diagnosis not present

## 2022-03-21 ENCOUNTER — Non-Acute Institutional Stay: Payer: Medicare PPO | Admitting: Orthopedic Surgery

## 2022-03-21 ENCOUNTER — Encounter: Payer: Self-pay | Admitting: Orthopedic Surgery

## 2022-03-21 DIAGNOSIS — J301 Allergic rhinitis due to pollen: Secondary | ICD-10-CM

## 2022-03-21 NOTE — Progress Notes (Signed)
Location:   [facility] Room Number: 867 A Place of Service:  ALF [phone]) Provider:  Windell Moulding, NP  [name], MD  Patient Care Team: [name], MD as PCP - General (Internal Medicine) [facility] Haste, NP as Nurse Practitioner (Geriatric Medicine) Adrian Prows, MD as Consulting Physician (Cardiology) Earlean Polka, MD as Consulting Physician (Ophthalmology)  Extended Emergency Contact Information Primary Emergency Contact: [name] of [place] Phone: [phone] Work Phone: [phone] Relation: Son Secondary Emergency Contact: [facility] Mobile Phone: [phone] Relation: Friend  Code Status:  DNR Goals of care: Advanced Directive information    03/21/2022   10:56 AM  Advanced Directives  Does Patient Have a Medical Advance Directive? Yes  Type of Paramedic of [place];Living will;Out of facility DNR (pink MOST or yellow form)  Does patient want to make changes to medical advance directive? No - Patient declined  Copy of [place] in Chart? Yes - validated most recent copy scanned in chart (See row information)  Pre-existing out of facility DNR order (yellow form or pink MOST form) Yellow form placed in chart (order not valid for inpatient use)     Chief Complaint  Patient presents with   Acute Visit    Allergies    HPI:  Pt is a 88 y.o. female seen today for an acute visit for increased nasal congestion.   She reports increased nasal congestion x 1 week. Nasal congestion clear. She has tried saline nasal spray a few times daily without success. She reports past allergy to pollen. She will spend time outside daily or every other day. She use to take Claritin. She is also having watery eyes at times and some frontal sinus discomfort. Denies fever, cough, ear pain, fatigue or malaise. No sick contacts. Treatment options  discussed. She would like to restart Claritin.     Past Medical History:  Diagnosis Date   Actinic keratosis 09/23/2012   Anal fissure and fistula 07/28/2008   Arthralgia of temporomandibular joint 12/30/2009   Atrial fibrillation (HCC)    Atrial tachycardia ([place])    s/p ablation 2008. [facility] (R free wall Atach)   Atrophy of vulva 09/23/2012   Blepharochalasis 03/01/2011   Cardiac dysrhythmia, unspecified 11/10/2009   Dermatophytosis of nail 02/28/2008   Dizziness and giddiness 04/03/2007   Dysphagia, unspecified(787.20) 01/17/2012   Edema 11/21/2006   External hemorrhoids without mention of complication 41/93/7902   HTN (hypertension)    Hyperlipidemia    Insomnia, unspecified 11/18/2004   Internal hemorrhoids without mention of complication 11/21/7351   Left bundle branch hemiblock    Loss of weight 03/05/2012   Myalgia and myositis, unspecified 01/05/2009   Orthostatic hypotension 03/22/2007   Other malaise and fatigue 03/05/2012   Pain in joint, site unspecified 02/28/2008   Palpitations 11/21/2006   Personality change due to conditions classified elsewhere    Pterygium eye, right 06/07/2020   Rectocele 09/23/2012   Restless legs syndrome (RLS)    Rosacea 09/23/2012   Sebaceous cyst 03/01/2011   Thumb pain 11/20/2012   Unspecified hereditary and idiopathic peripheral neuropathy    Past Surgical History:  Procedure Laterality Date   BLADDER SUSPENSION  1998   CATARACT EXTRACTION W/ INTRAOCULAR LENS IMPLANT Right 4/8//2008   Dr. Charise Killian   catheter ablation  2008   for atrial tachycardia   CYST EXCISION  1998   mucus cyst removal from finger   PTERYGIUM EXCISION  06/07/2020   TOTAL ABDOMINAL HYSTERECTOMY  2001   uterine bleeding    Allergies  Allergen Reactions   Erythromycin Nausea And Vomiting   Flecainide Acetate     Postural hypotension   Penicillins     Allergies as of 03/21/2022       Reactions   Erythromycin Nausea And Vomiting   Flecainide Acetate    Postural hypotension    Penicillins         Medication List        Accurate as of March 21, 2022 10:57 AM. If you have any questions, ask your nurse or doctor.          STOP taking these medications    senna-docusate 8.6-50 MG tablet Commonly known as: Senokot-S Stopped by: Yvonna Alanis, NP   sodium fluoride 1.1 % Gel dental gel Commonly known as: FLUORISHIELD Stopped by: Yvonna Alanis, NP       TAKE these medications    acetaminophen 500 MG tablet Commonly known as: TYLENOL Take 1,000 mg by mouth every 8 (eight) hours as needed.   amiodarone 200 MG tablet Commonly known as: PACERONE Take 200 mg by mouth daily.   amLODipine 2.5 MG tablet Commonly known as: NORVASC Take 5 mg by mouth daily.   cholecalciferol 25 MCG (1000 UNIT) tablet Commonly known as: VITAMIN D3 Take 1,000 Units by mouth daily.   diltiazem 60 MG tablet Commonly known as: Cardizem Take 1 tablet (60 mg total) by mouth 3 (three) times daily as needed (For heart rate >110 bpm).   furosemide 20 MG tablet Commonly known as: LASIX TAKE 1 TABLET ONCE DAILY AS NEEDED FOR SWELLING- TAKE 1/2 TABLET FOR LEG SWELLING AS NEEDED.   GENTEAL TEARS OP Apply 1 drop to eye as needed.   hydrALAZINE 10 MG tablet Commonly known as: APRESOLINE Take 10 mg by mouth 3 (three) times daily as needed.   hydrALAZINE 25 MG tablet Commonly known as: APRESOLINE Take 25 mg by mouth 2 (two) times daily.   loratadine 10 MG tablet Commonly known as: CLARITIN Take 10 mg by mouth at bedtime. x1 month   METAMUCIL PO Take by mouth at bedtime. Fiber water. 2 waters   polyethylene glycol 17 g packet Commonly known as: MIRALAX / GLYCOLAX Take 17 g by mouth daily as needed (and 1/2 up in the morning scheduled).   PRESERVISION AREDS PO Take 2 tablets by mouth daily.   Multivitamin Adults 50+ Tabs Take 1 tablet by mouth daily.   Robafen DM Cough 10-100 MG/5ML liquid Generic drug: dextromethorphan-guaiFENesin Take 10 mLs by mouth every 6  (six) hours as needed for cough.   rosuvastatin 20 MG tablet Commonly known as: CRESTOR TAKE ONE TABLET BY MOUTH ONCE DAILY   Voltaren 1 % Gel Generic drug: diclofenac Sodium Apply topically 2 (two) times daily. May self apply to left knee/hip prn discomfort   Xarelto 20 MG Tabs tablet Generic drug: rivaroxaban TAKE 1 TABLET ONCE DAILY WITH DINNER.   Xopenex 0.63 MG/3ML nebulizer solution Generic drug: levalbuterol Take 0.63 mg by nebulization 2 (two) times daily as needed for wheezing or shortness of breath.        Review of Systems  Constitutional:  Negative for activity change, appetite change, chills, fatigue and fever.  HENT:  Positive for rhinorrhea, sinus pressure and sneezing. Negative for trouble swallowing.   Eyes:  Negative for visual disturbance.       Watery eyes  Respiratory:  Negative for cough, shortness of  breath and wheezing.   Cardiovascular:  Negative for chest pain and leg swelling.  Gastrointestinal:  Negative for nausea and vomiting.  Genitourinary:  Negative for dysuria and frequency.  Musculoskeletal:  Negative for myalgias.  Skin:  Negative for rash.  Allergic/Immunologic: Positive for environmental allergies.  Neurological:  Negative for weakness.  Psychiatric/Behavioral:  Negative for confusion and dysphoric mood. The patient is not nervous/anxious.     Immunization History  Administered Date(s) Administered   Influenza Whole 06/12/2012   Influenza, High Dose Seasonal PF 05/23/2019, 06/11/2020   Influenza,inj,Quad PF,6+ Mos 05/13/2013, 06/07/2018   Influenza-Unspecified 05/27/2014, 04/24/2015, 04/13/2016, 04/24/2017, 06/11/2020, 06/11/2021   Moderna Covid-19 Vaccine Bivalent Booster 3yr & up 06/21/2021   Moderna SARS-COV2 Booster Vaccination 04/18/2021   Moderna Sars-Covid-2 Vaccination 08/26/2019, 09/23/2019, 06/29/2020   Pneumococcal Conjugate-13 05/27/2014   Pneumococcal Polysaccharide-23 08/14/1997   Td 04/15/2011   Tdap 07/15/2015    Zoster Recombinat (Shingrix) 02/12/2017, 06/28/2017   Zoster, Live 08/15/2007   Pertinent  Health Maintenance Due  Topic Date Due   INFLUENZA VACCINE  03/14/2022   DEXA SCAN  Completed      05/18/2021   10:44 AM 07/20/2021    8:42 AM 09/05/2021    2:14 PM 12/07/2021   10:00 AM 12/19/2021   11:07 AM  [place] the past year? 0 1 0 0 0  Was there an injury with Fall?  0 0 0 0  Fall Risk Category Calculator  1 0 0 0  Fall Risk Category  Low Low Low Low  Patient Fall Risk Level Low fall risk Low fall risk Low fall risk Low fall risk Low fall risk  Patient at Risk for Falls Due to  History of fall(s) No Fall Risks No Fall Risks No Fall Risks  Fall risk Follow up Falls evaluation completed Falls evaluation completed;Education provided;Falls prevention discussed Falls evaluation completed Falls evaluation completed Falls evaluation completed   Functional Status Survey:    Vitals:   03/21/22 1032  BP: (!) 142/72  Pulse: 62  Resp: 18  Temp: (!) 97 F (36.1 C)  SpO2: 91%  Weight: 155 lb 9.6 oz (70.6 kg)  Height: '5\' 4"'$  (1.626 m)   Body mass index is 26.71 kg/m. Physical Exam Vitals reviewed.  HENT:     Head: Normocephalic.     Right Ear: There is no impacted cerumen.     Left Ear: There is no impacted cerumen.     Nose: Rhinorrhea present. Rhinorrhea is clear.     Right Turbinates: Enlarged.     Left Turbinates: Enlarged.     Right Sinus: Frontal sinus tenderness present.     Left Sinus: Frontal sinus tenderness present.  Eyes:     General:        Right eye: Discharge present.        Left eye: Discharge present.    Comments: clear  Cardiovascular:     Rate and Rhythm: Normal rate. Rhythm irregular.     Pulses: Normal pulses.     Heart sounds: Normal heart sounds.  Pulmonary:     Effort: Pulmonary effort is normal. No respiratory distress.     Breath sounds: Normal breath sounds. No wheezing.  Abdominal:     General: Bowel sounds are normal. There is no  distension.     Palpations: Abdomen is soft.     Tenderness: There is no abdominal tenderness.  Musculoskeletal:     Cervical back: Neck supple.     Right lower  leg: No edema.     Left lower leg: No edema.  Lymphadenopathy:     Cervical: No cervical adenopathy.  Skin:    General: Skin is warm and dry.     Capillary Refill: Capillary refill takes less than 2 seconds.  Neurological:     General: No focal deficit present.     Mental Status: She is alert and oriented to person, place, and time.  Psychiatric:        Mood and Affect: Mood normal.        Behavior: Behavior normal.     Labs reviewed: Recent Labs    07/20/21 0929 07/25/21 0000 12/05/21 0000  NA 141 143 142  K 3.4* 4.3 4.0  CL 99 104 104  CO2 28 27* 25*  GLUCOSE 119*  --   --   BUN 34* '13 19  19  '$ CREATININE 1.34* 0.7 0.9  CALCIUM 9.3 9.1 8.7   Recent Labs    12/05/21 0000  AST 22  ALT 16  ALKPHOS 64  ALBUMIN 3.9   Recent Labs    12/05/21 0000  WBC 4.3  HGB 11.7*  HCT 35*  PLT 249   Lab Results  Component Value Date   TSH 3.94 12/05/2021   No results found for: "HGBA1C" Lab Results  Component Value Date   CHOL 160 04/22/2019   HDL 59 04/22/2019   LDLCALC 87 04/22/2019   TRIG 73 04/22/2019    Significant Diagnostic Results in last 30 days:  No results found.  Assessment/Plan 1. Seasonal allergic rhinitis due to pollen - onset 1 week - symptoms clear nasal drainage, watery eyes, frontal sinus discomfort - nasal turbinates swollen, afebrile - saline nasal spay not working - start Claritin 10 mg po qhs x 1 month    Family/ staff Communication: plan discussed with patient and nurse  Labs/tests ordered:  none

## 2022-03-22 ENCOUNTER — Encounter: Payer: Self-pay | Admitting: Cardiology

## 2022-03-22 ENCOUNTER — Ambulatory Visit: Payer: Medicare PPO | Admitting: Cardiology

## 2022-03-22 VITALS — BP 157/67 | HR 71 | Temp 98.7°F | Resp 16 | Ht 64.0 in | Wt 156.2 lb

## 2022-03-22 DIAGNOSIS — I4819 Other persistent atrial fibrillation: Secondary | ICD-10-CM | POA: Diagnosis not present

## 2022-03-22 DIAGNOSIS — I10 Essential (primary) hypertension: Secondary | ICD-10-CM | POA: Diagnosis not present

## 2022-03-22 DIAGNOSIS — E7800 Pure hypercholesterolemia, unspecified: Secondary | ICD-10-CM | POA: Diagnosis not present

## 2022-03-22 DIAGNOSIS — I6523 Occlusion and stenosis of bilateral carotid arteries: Secondary | ICD-10-CM | POA: Diagnosis not present

## 2022-03-22 MED ORDER — AMIODARONE HCL 200 MG PO TABS: 100.0000 mg | ORAL_TABLET | Freq: Every day | ORAL | Status: DC

## 2022-03-22 MED ORDER — ROSUVASTATIN CALCIUM 10 MG PO TABS: 10.0000 mg | ORAL_TABLET | Freq: Every day | ORAL | Status: AC

## 2022-03-22 MED ORDER — RIVAROXABAN 15 MG PO TABS: 15.0000 mg | ORAL_TABLET | Freq: Every day | ORAL | 12 refills | Status: AC

## 2022-03-22 NOTE — Progress Notes (Signed)
Primary Physician/Referring:  [name], MD  Patient ID: Rebekah Graham, female    DOB: August 03, 1929, 88 y.o.   MRN: [id number]  Chief Complaint  Patient presents with   Hypertension   Atrial Fibrillation   Follow-up    6 months   HPI:     [name]  is a 88 y.o. Caucasian female with history of paroxysmal atrial fibrillation, hypertension and hyperlipidemia.    Patient presents for office visit for management of hypertension.    Patient brings with her records from [facility].  Since increasing hydralazine and amlodipine patient's blood pressure is well controlled and on home monitoring heart rate is under excellent control.  She has no specific complaints today.  Past Medical History:  Diagnosis Date   Actinic keratosis 09/23/2012   Anal fissure and fistula 07/28/2008   Arthralgia of temporomandibular joint 12/30/2009   Atrial fibrillation (HCC)    Atrial tachycardia ([place])    s/p ablation 2008. [facility] (R free wall Atach)   Atrophy of vulva 09/23/2012   Blepharochalasis 03/01/2011   Cardiac dysrhythmia, unspecified 11/10/2009   Dermatophytosis of nail 02/28/2008   Dizziness and giddiness 04/03/2007   Dysphagia, unspecified(787.20) 01/17/2012   Edema 11/21/2006   External hemorrhoids without mention of complication 16/02/3709   HTN (hypertension)    Hyperlipidemia    Insomnia, unspecified 11/18/2004   Internal hemorrhoids without mention of complication 02/07/9484   Left bundle branch hemiblock    Loss of weight 03/05/2012   Myalgia and myositis, unspecified 01/05/2009   Orthostatic hypotension 03/22/2007   Other malaise and fatigue 03/05/2012   Pain in joint, site unspecified 02/28/2008   Palpitations 11/21/2006   Personality change due to conditions classified elsewhere    Pterygium eye, right 06/07/2020   Rectocele 09/23/2012   Restless legs syndrome (RLS)    Rosacea 09/23/2012   Sebaceous cyst 03/01/2011   Thumb pain 11/20/2012   Unspecified hereditary and  idiopathic peripheral neuropathy    Past Surgical History:  Procedure Laterality Date   BLADDER SUSPENSION  1998   CATARACT EXTRACTION W/ INTRAOCULAR LENS IMPLANT Right 4/8//2008   Dr. Charise Killian   catheter ablation  2008   for atrial tachycardia   CYST EXCISION  1998   mucus cyst removal from finger   PTERYGIUM EXCISION  06/07/2020   TOTAL ABDOMINAL HYSTERECTOMY  2001   uterine bleeding   Social History   Tobacco Use   Smoking status: Never   Smokeless tobacco: Never   Tobacco comments:    tobacco  - none  Substance Use Topics   Alcohol use: Yes    Comment: 1-2 glasses of wine/gin daily     Family History  Problem Relation Age of Onset   Heart disease Mother    Heart disease Father    Cancer Sister        breast    ROS  Review of Systems  Cardiovascular:  Positive for leg swelling (chronic). Negative for chest pain and dyspnea on exertion.   Objective  Blood pressure (!) 157/67, pulse 71, temperature 98.7 F (37.1 C), temperature source Temporal, resp. rate 16, height _0  (1.626 m), weight 156 lb 3.2 oz (70.9 kg), SpO2 96 %. Body mass index is 26.81 kg/m.    Physical Exam Vitals reviewed.  Neck:     Vascular: No JVD.  Cardiovascular:     Rate and Rhythm: Normal rate and regular rhythm.     Pulses: Intact distal pulses.  Carotid pulses are  on the right side with bruit and  on the left side with bruit.    Heart sounds: S1 normal and S2 normal. No murmur heard.    No gallop.  Pulmonary:     Effort: Pulmonary effort is normal. No respiratory distress.     Breath sounds: No wheezing, rhonchi or rales.  Abdominal:     General: Bowel sounds are normal.     Palpations: Abdomen is soft.  Musculoskeletal:     Right lower leg: Edema (1-2 plus) present.     Left lower leg: Edema (trace) present.    Laboratory examination:   Lab Results  Component Value Date   NA 142 12/05/2021   K 4.0 12/05/2021   CO2 25 (A) 12/05/2021   GLUCOSE 119 (H) 07/20/2021    BUN 19 12/05/2021   BUN 19 12/05/2021   CREATININE 0.9 12/05/2021   CALCIUM 8.7 12/05/2021   EGFR 38 (L) 07/20/2021       Latest Ref Rng & Units 12/05/2021   12:00 AM 07/25/2021   12:00 AM 07/20/2021    9:29 AM  CMP  Glucose 65 - 99 mg/dL   119   BUN 4 - 21 4 - _0 34   Creatinine 0.5 - 1.1 0.9     0.7     1.34   Sodium 137 - 147 142     143     141   Potassium 3.5 - 5.1 mEq/L 4.0     4.3     3.4   Chloride 99 - 108 104     104     99   CO2 13 - _1 Calcium 8.7 - 10.7 8.7     9.1     9.3   Alkaline Phos 25 - 125 64        AST 13 - 35 22        ALT 7 - 35 U/L 16           This result is from an external source.   Multiple values from one day are sorted in reverse-chronological order      Latest Ref Rng & Units 12/05/2021   12:00 AM 10/28/2020   12:00 AM 04/20/2020   12:00 AM  CBC  WBC  4.3     7.0     4.9       4.9   Hemoglobin 12.0 - 16.0 11.7     13.1     13.0       13.0   Hematocrit 36 - 46 35     38     246       39   Platelets 150 - 400 K/uL 249     254          This result is from an external source.   Multiple values from one day are sorted in reverse-chronological order   Lipid Panel     Component Value Date/Time   CHOL 160 04/22/2019 0000   TRIG 73 04/22/2019 0000   HDL 59 04/22/2019 0000   LDLCALC 87 04/22/2019 0000   HEMOGLOBIN A1C No results found for: "HGBA1C", "MPG" TSH Recent Labs    12/05/21 0000  TSH 3.94    Allergies   Allergies  Allergen Reactions  Erythromycin Nausea And Vomiting   Flecainide Acetate     Postural hypotension   Penicillins      Final Medications at End of Visit     Current Outpatient Medications:    acetaminophen (TYLENOL) 500 MG tablet, Take 1,000 mg by mouth every 8 (eight) hours as needed., Disp: , Rfl:    amLODipine (NORVASC) 2.5 MG tablet, Take 5 mg by mouth daily., Disp: , Rfl:    Artificial Tear Solution (GENTEAL TEARS OP), Apply 1 drop to eye as needed., Disp: ,  Rfl:    cholecalciferol (VITAMIN D3) 25 MCG (1000 UNIT) tablet, Take 1,000 Units by mouth daily., Disp: , Rfl:    dextromethorphan-guaiFENesin (ROBAFEN DM COUGH) 10-100 MG/5ML liquid, Take 10 mLs by mouth every 6 (six) hours as needed for cough., Disp: , Rfl:    diclofenac Sodium (VOLTAREN) 1 % GEL, Apply topically 2 (two) times daily. May self apply to left knee/hip prn discomfort, Disp: , Rfl:    diltiazem (CARDIZEM) 60 MG tablet, Take 1 tablet (60 mg total) by mouth 3 (three) times daily as needed (For heart rate >110 bpm)., Disp: 30 tablet, Rfl: 3   furosemide (LASIX) 20 MG tablet, TAKE 1 TABLET ONCE DAILY AS NEEDED FOR SWELLING- TAKE 1/2 TABLET FOR LEG SWELLING AS NEEDED., Disp: 60 tablet, Rfl: 0   hydrALAZINE (APRESOLINE) 10 MG tablet, Take 10 mg by mouth 3 (three) times daily as needed., Disp: , Rfl:    hydrALAZINE (APRESOLINE) 25 MG tablet, Take 25 mg by mouth 2 (two) times daily., Disp: , Rfl:    levalbuterol (XOPENEX) 0.63 MG/3ML nebulizer solution, Take 0.63 mg by nebulization 2 (two) times daily as needed for wheezing or shortness of breath., Disp: , Rfl:    loratadine (CLARITIN) 10 MG tablet, Take 10 mg by mouth at bedtime. x1 month, Disp: , Rfl:    Multiple Vitamins-Minerals (MULTIVITAMIN ADULTS 50+) TABS, Take 1 tablet by mouth daily., Disp: , Rfl:    Multiple Vitamins-Minerals (PRESERVISION AREDS PO), Take 2 tablets by mouth daily. , Disp: , Rfl:    polyethylene glycol (MIRALAX / GLYCOLAX) 17 g packet, Take 17 g by mouth daily as needed (and 1/2 up in the morning scheduled)., Disp: , Rfl:    Psyllium (METAMUCIL PO), Take by mouth at bedtime. Fiber water. 2 waters, Disp: , Rfl:    Rivaroxaban (XARELTO) 15 MG TABS tablet, Take 1 tablet (15 mg total) by mouth daily with supper., Disp: 30 tablet, Rfl: 12   amiodarone (PACERONE) 200 MG tablet, Take 0.5 tablets (100 mg total) by mouth daily., Disp: , Rfl:    rosuvastatin (CRESTOR) 10 MG tablet, Take 1 tablet (10 mg total) by mouth daily.,  Disp: , Rfl:   Radiology:  No results found.  Cardiac Studies:   Echo- 07/13/2015 1. Left ventricle cavity is normal in size. Mild concentric hypertrophy of the left ventricle. Normal global wall motion. Calculated EF 66%. 2. Left atrial cavity is mildly dilated. 3. Trace aortic regurgitation. 4. Mild mitral regurgitation. 5. Mild tricuspid regurgitation. No evidence of pulmonary hypertension.  Carotid artery duplex 10/14/2020: Minimal stenosis in the right internal carotid artery (minimal). Stenosis in the right external carotid artery (<50%). Stenosis in the left internal carotid artery (16-49%). Stenosis in the left external carotid artery (<50%). Antegrade right vertebral artery flow. Antegrade left vertebral artery flow. Compared to 05/03/2020, left ICA stenosis of 50-69% is now 16-49%.  Upper limit of the spectrum.  Overall, no significant change. Follow up in one year  is appropriate if clinically indicated.  EKG:    EKG 03/22/2022: Atrial fibrillation with controlled ventricular sponsor at rate of 58 bpm, normal axis, anteroseptal infarct old.  Nonspecific T abnormality.  Assessment     ICD-10-CM   1. Persistent atrial fibrillation (HCC)  I48.19 EKG 12-Lead    Rivaroxaban (XARELTO) 15 MG TABS tablet    amiodarone (PACERONE) 200 MG tablet    2. Essential hypertension  I10     3. Asymptomatic bilateral carotid artery stenosis  I65.23 rosuvastatin (CRESTOR) 10 MG tablet    4. Hypercholesteremia  E78.00 rosuvastatin (CRESTOR) 10 MG tablet       Medications Discontinued During This Encounter  Medication Reason   XARELTO 20 MG TABS tablet Dose change   rosuvastatin (CRESTOR) 20 MG tablet Reorder   amiodarone (PACERONE) 200 MG tablet Reorder     Meds ordered this encounter  Medications   rosuvastatin (CRESTOR) 10 MG tablet    Sig: Take 1 tablet (10 mg total) by mouth daily.   Rivaroxaban (XARELTO) 15 MG TABS tablet    Sig: Take 1 tablet (15 mg total) by mouth daily with  supper.    Dispense:  30 tablet    Refill:  12   amiodarone (PACERONE) 200 MG tablet    Sig: Take 0.5 tablets (100 mg total) by mouth daily.  This patients CHA2DS2-VASc Score 4 (HTN, Age, F)and yearly risk of stroke 4.8%.   Recommendations:    Camil Hausmann  is a 88 y.o. Caucasian female with history of paroxysmal atrial fibrillation, hypertension and hyperlipidemia and stage IIIa-B chronic kidney disease presents here for a 28-monthoffice visit.  From cardiac standpoint he remains asymptomatic.  Heart rate is well-controlled, there is no clinical evidence of heart failure.  She has chronic bilateral lower extremity edema that has remained stable and there is no open wounds.  After review of her labs, realized that her renal insufficiency has been ongoing for the past >1-year, Xarelto does need to be decreased from 20 mg to 50 mg in the evening.  If her renal function continues to deteriorate over time, we could even consider switching her to 10 mg of Xarelto although this is not the recommended dose to reduce the risk of bleeding.  Also her heart rate is well-controlled now we will reduce the dose of the amiodarone from 200 mg to 100 mg daily.  Although she is in atrial fibrillation, auscultation tends to reveal regular heartbeat, however she clearly is in atrial fibrillation by EKG.  This is due to rate controlled atrial fibrillation.  In view of her advanced age I will reduce the dose of Crestor from 20 mg to 10 mg daily, LDL target is not important at this time.  Conservative management and symptom based management would be appropriate in this ultraelderly patient.  It was a pleasure to see her today, I will see her back in 6 months for follow-up.    This was a 40-minute office visit encounter in review of her medications, external labs, making medication changes and coordination of care.  I have also sent a handwritten letter to the assisted living facility.   JAdrian Prows PA-C 03/22/2022,  10:50 AM Office: [phone]

## 2022-03-23 DIAGNOSIS — E782 Mixed hyperlipidemia: Secondary | ICD-10-CM | POA: Diagnosis not present

## 2022-03-23 LAB — LIPID PANEL
Cholesterol: 161 (ref 0–200)
HDL: 76 — AB (ref 35–70)
LDL Cholesterol: 75
LDl/HDL Ratio: 2.1
Triglycerides: 52 (ref 40–160)

## 2022-03-27 DIAGNOSIS — H02105 Unspecified ectropion of left lower eyelid: Secondary | ICD-10-CM | POA: Diagnosis not present

## 2022-03-27 DIAGNOSIS — H02102 Unspecified ectropion of right lower eyelid: Secondary | ICD-10-CM | POA: Diagnosis not present

## 2022-03-27 DIAGNOSIS — Z961 Presence of intraocular lens: Secondary | ICD-10-CM | POA: Diagnosis not present

## 2022-04-04 DIAGNOSIS — H43813 Vitreous degeneration, bilateral: Secondary | ICD-10-CM | POA: Diagnosis not present

## 2022-04-04 DIAGNOSIS — H353122 Nonexudative age-related macular degeneration, left eye, intermediate dry stage: Secondary | ICD-10-CM | POA: Diagnosis not present

## 2022-04-04 DIAGNOSIS — H353211 Exudative age-related macular degeneration, right eye, with active choroidal neovascularization: Secondary | ICD-10-CM | POA: Diagnosis not present

## 2022-04-04 DIAGNOSIS — H02125 Mechanical ectropion of left lower eyelid: Secondary | ICD-10-CM | POA: Diagnosis not present

## 2022-04-14 DIAGNOSIS — H353211 Exudative age-related macular degeneration, right eye, with active choroidal neovascularization: Secondary | ICD-10-CM | POA: Diagnosis not present

## 2022-04-18 DIAGNOSIS — H04203 Unspecified epiphora, bilateral lacrimal glands: Secondary | ICD-10-CM | POA: Diagnosis not present

## 2022-04-18 DIAGNOSIS — H04523 Eversion of bilateral lacrimal punctum: Secondary | ICD-10-CM | POA: Diagnosis not present

## 2022-04-19 ENCOUNTER — Encounter: Payer: Self-pay | Admitting: Orthopedic Surgery

## 2022-04-19 ENCOUNTER — Non-Acute Institutional Stay: Payer: Medicare PPO | Admitting: Orthopedic Surgery

## 2022-04-19 DIAGNOSIS — J310 Chronic rhinitis: Secondary | ICD-10-CM | POA: Diagnosis not present

## 2022-04-19 MED ORDER — AZELASTINE HCL 0.15 % NA SOLN: 2.0000 | Freq: Two times a day (BID) | NASAL | 0 refills | Status: DC

## 2022-04-19 MED ORDER — LORATADINE 10 MG PO TABS: 10.0000 mg | ORAL_TABLET | Freq: Every day | ORAL | 11 refills | Status: DC

## 2022-04-19 NOTE — Progress Notes (Signed)
Location:  [place] Room Number: 656/C Place of Service:  ALF [phone]) Provider:  Yvonna Alanis, NP   [name], MD  Patient Care Team: [name], MD as PCP - General (Internal Medicine) [facility]ll[facility] Dyke, Argentina Donovan, NP as Nurse Practitioner (Geriatric Medicine) Adrian Prows, MD as Consulting Physician (Cardiology) Earlean Polka, MD as Consulting Physician (Ophthalmology)  Extended Emergency Contact Information Primary Emergency Contact: [name] of [place] Phone: [phone] Work Phone: [phone] Relation: Son Secondary Emergency Contact: [facility] Mobile Phone: [phone] Relation: Friend  Code Status:  DNR Goals of care: Advanced Directive information    03/21/2022   10:56 AM  Advanced Directives  Does Patient Have a Medical Advance Directive? Yes  Type of Paramedic of [place];Living will;Out of facility DNR (pink MOST or yellow form)  Does patient want to make changes to medical advance directive? No - Patient declined  Copy of [place] in Chart? Yes - validated most recent copy scanned in chart (See row information)  Pre-existing out of facility DNR order (yellow form or pink MOST form) Yellow form placed in chart (order not valid for inpatient use)     Chief Complaint  Patient presents with   Acute Visit    Ongoing nasal drip    HPI:  Pt is a 88 y.o. female seen today for acute visit due to ongoing nasal drip.   08/09 she was seen for nasal congestion and started on Xyzal x 1 month. She reports increased nasal congestion x 1 week. Nasal congestion clear. Intermittent congestion throughout day. She is still using saline nasal spray a few times daily as well. Does not believe Xyzal helped, requesting to go back on Claritin. Denies cold symptoms. Afebrile, vitals stable. She denies having allergies. Denies changing soaps,  fragrances, or detergents. She states she does not go outside often. Nursing staff reports she sits outside a few times daily.    Past Medical History:  Diagnosis Date   Actinic keratosis 09/23/2012   Anal fissure and fistula 07/28/2008   Arthralgia of temporomandibular joint 12/30/2009   Atrial fibrillation (HCC)    Atrial tachycardia ([place])    s/p ablation 2008. [facility] (R free wall Atach)   Atrophy of vulva 09/23/2012   Blepharochalasis 03/01/2011   Cardiac dysrhythmia, unspecified 11/10/2009   Dermatophytosis of nail 02/28/2008   Dizziness and giddiness 04/03/2007   Dysphagia, unspecified(787.20) 01/17/2012   Edema 11/21/2006   External hemorrhoids without mention of complication 01/77/9390   HTN (hypertension)    Hyperlipidemia    Insomnia, unspecified 11/18/2004   Internal hemorrhoids without mention of complication 3/00/9233   Left bundle branch hemiblock    Loss of weight 03/05/2012   Myalgia and myositis, unspecified 01/05/2009   Orthostatic hypotension 03/22/2007   Other malaise and fatigue 03/05/2012   Pain in joint, site unspecified 02/28/2008   Palpitations 11/21/2006   Personality change due to conditions classified elsewhere    Pterygium eye, right 06/07/2020   Rectocele 09/23/2012   Restless legs syndrome (RLS)    Rosacea 09/23/2012   Sebaceous cyst 03/01/2011   Thumb pain 11/20/2012   Unspecified hereditary and idiopathic peripheral neuropathy    Past Surgical History:  Procedure Laterality Date   BLADDER SUSPENSION  1998   CATARACT EXTRACTION W/ INTRAOCULAR LENS IMPLANT Right 4/8//2008   Dr. Charise Killian   catheter ablation  2008   for atrial tachycardia   CYST EXCISION  1998  mucus cyst removal from finger   PTERYGIUM EXCISION  06/07/2020   TOTAL ABDOMINAL HYSTERECTOMY  2001   uterine bleeding    Allergies  Allergen Reactions   Erythromycin Nausea And Vomiting   Flecainide Acetate     Postural hypotension   Penicillins     Outpatient Encounter Medications as of 04/19/2022   Medication Sig   acetaminophen (TYLENOL) 500 MG tablet Take 1,000 mg by mouth every 8 (eight) hours as needed.   amiodarone (PACERONE) 200 MG tablet Take 0.5 tablets (100 mg total) by mouth daily.   amLODipine (NORVASC) 2.5 MG tablet Take 5 mg by mouth daily.   Artificial Tear Solution (GENTEAL TEARS OP) Apply 1 drop to eye as needed.   cholecalciferol (VITAMIN D3) 25 MCG (1000 UNIT) tablet Take 1,000 Units by mouth daily.   dextromethorphan-guaiFENesin (ROBAFEN DM COUGH) 10-100 MG/5ML liquid Take 10 mLs by mouth every 6 (six) hours as needed for cough.   diclofenac Sodium (VOLTAREN) 1 % GEL Apply topically 2 (two) times daily. May self apply to left knee/hip prn discomfort   diltiazem (CARDIZEM) 60 MG tablet Take 1 tablet (60 mg total) by mouth 3 (three) times daily as needed (For heart rate >110 bpm).   furosemide (LASIX) 20 MG tablet TAKE 1 TABLET ONCE DAILY AS NEEDED FOR SWELLING- TAKE 1/2 TABLET FOR LEG SWELLING AS NEEDED.   hydrALAZINE (APRESOLINE) 10 MG tablet Take 10 mg by mouth 3 (three) times daily as needed.   hydrALAZINE (APRESOLINE) 25 MG tablet Take 25 mg by mouth 2 (two) times daily.   levalbuterol (XOPENEX) 0.63 MG/3ML nebulizer solution Take 0.63 mg by nebulization 2 (two) times daily as needed for wheezing or shortness of breath.   loratadine (CLARITIN) 10 MG tablet Take 10 mg by mouth at bedtime. x1 month   Multiple Vitamins-Minerals (MULTIVITAMIN ADULTS 50+) TABS Take 1 tablet by mouth daily.   Multiple Vitamins-Minerals (PRESERVISION AREDS PO) Take 2 tablets by mouth daily.    polyethylene glycol (MIRALAX / GLYCOLAX) 17 g packet Take 17 g by mouth daily as needed (and 1/2 up in the morning scheduled).   Psyllium (METAMUCIL PO) Take by mouth at bedtime. Fiber water. 2 waters   Rivaroxaban (XARELTO) 15 MG TABS tablet Take 1 tablet (15 mg total) by mouth daily with supper.   rosuvastatin (CRESTOR) 10 MG tablet Take 1 tablet (10 mg total) by mouth daily.   No  facility-administered encounter medications on file as of 04/19/2022.    Review of Systems  Constitutional:  Negative for activity change, appetite change, chills, fatigue and fever.  HENT:  Positive for rhinorrhea and sneezing. Negative for ear pain, sinus pressure, sinus pain, sore throat and tinnitus.   Eyes:  Negative for visual disturbance.  Respiratory:  Negative for cough, shortness of breath and wheezing.   Cardiovascular:  Negative for chest pain and leg swelling.  Gastrointestinal:  Negative for nausea and vomiting.  Musculoskeletal:  Negative for myalgias.  Skin:  Negative for rash.  Allergic/Immunologic: Negative for environmental allergies and food allergies.  Neurological:  Negative for dizziness, weakness and headaches.  Psychiatric/Behavioral:  Negative for dysphoric mood. The patient is not nervous/anxious.     Immunization History  Administered Date(s) Administered   Influenza Whole 06/12/2012   Influenza, High Dose Seasonal PF 05/23/2019, 06/11/2020   Influenza,inj,Quad PF,6+ Mos 05/13/2013, 06/07/2018   Influenza-Unspecified 05/27/2014, 04/24/2015, 04/13/2016, 04/24/2017, 06/11/2020, 06/11/2021   Moderna Covid-19 Vaccine Bivalent Booster 41yr & up 06/21/2021   Moderna SARS-COV2 Booster Vaccination 04/18/2021  Moderna Sars-Covid-2 Vaccination 08/26/2019, 09/23/2019, 06/29/2020   Pneumococcal Conjugate-13 05/27/2014   Pneumococcal Polysaccharide-23 08/14/1997   Td 04/15/2011   Tdap 07/15/2015   Zoster Recombinat (Shingrix) 02/12/2017, 06/28/2017   Zoster, Live 08/15/2007   Pertinent  Health Maintenance Due  Topic Date Due   INFLUENZA VACCINE  03/14/2022   DEXA SCAN  Completed      05/18/2021   10:44 AM 07/20/2021    8:42 AM 09/05/2021    2:14 PM 12/07/2021   10:00 AM 12/19/2021   11:07 AM  [place] in the past year? 0 1 0 0 0  Was there an injury with Fall?  0 0 0 0  Fall Risk Category Calculator  1 0 0 0  Fall Risk Category  Low Low Low Low   Patient Fall Risk Level Low fall risk Low fall risk Low fall risk Low fall risk Low fall risk  Patient at Risk for Falls Due to  History of fall(s) No Fall Risks No Fall Risks No Fall Risks  Fall risk Follow up Falls evaluation completed Falls evaluation completed;Education provided;Falls prevention discussed Falls evaluation completed Falls evaluation completed Falls evaluation completed   Functional Status Survey:    Vitals:   04/19/22 1510  BP: (!) 147/73  Pulse: 60  Resp: 18  Temp: 97.7 F (36.5 C)  SpO2: 96%  Weight: 159 lb 3.2 oz (72.2 kg)  Height: '5\' 4"'$  (1.626 m)   Body mass index is 27.33 kg/m. Physical Exam Vitals reviewed.  Constitutional:      General: She is not in acute distress. HENT:     Head: Normocephalic.     Right Ear: Tympanic membrane, ear canal and external ear normal.     Left Ear: Tympanic membrane, ear canal and external ear normal.     Nose: Nose normal.     Right Turbinates: Not enlarged, swollen or pale.     Left Turbinates: Not enlarged, swollen or pale.     Right Sinus: No maxillary sinus tenderness or frontal sinus tenderness.     Left Sinus: No maxillary sinus tenderness or frontal sinus tenderness.     Mouth/Throat:     Mouth: Mucous membranes are moist.     Pharynx: No oropharyngeal exudate or posterior oropharyngeal erythema.  Eyes:     General:        Right eye: No discharge.        Left eye: No discharge.  Cardiovascular:     Rate and Rhythm: Normal rate and regular rhythm.     Pulses: Normal pulses.     Heart sounds: Normal heart sounds.  Pulmonary:     Effort: Pulmonary effort is normal. No respiratory distress.     Breath sounds: Normal breath sounds. No wheezing or rales.  Abdominal:     General: Bowel sounds are normal. There is no distension.     Palpations: Abdomen is soft.     Tenderness: There is no abdominal tenderness.  Musculoskeletal:     Cervical back: Neck supple.     Right lower leg: No edema.     Left lower  leg: No edema.  Skin:    General: Skin is warm and dry.     Capillary Refill: Capillary refill takes less than 2 seconds.  Neurological:     General: No focal deficit present.     Mental Status: She is alert and oriented to person, place, and time.  Psychiatric:        Mood  and Affect: Mood normal.        Behavior: Behavior normal.     Labs reviewed: Recent Labs    07/20/21 0929 07/25/21 0000 12/05/21 0000  NA 141 143 142  K 3.4* 4.3 4.0  CL 99 104 104  CO2 28 27* 25*  GLUCOSE 119*  --   --   BUN 34* '13 19  19  '$ CREATININE 1.34* 0.7 0.9  CALCIUM 9.3 9.1 8.7   Recent Labs    12/05/21 0000  AST 22  ALT 16  ALKPHOS 64  ALBUMIN 3.9   Recent Labs    12/05/21 0000  WBC 4.3  HGB 11.7*  HCT 35*  PLT 249   Lab Results  Component Value Date   TSH 3.94 12/05/2021   No results found for: "HGBA1C" Lab Results  Component Value Date   CHOL 161 03/23/2022   HDL 76 (A) 03/23/2022   LDLCALC 75 03/23/2022   TRIG 52 03/23/2022    Significant Diagnostic Results in last 30 days:  No results found.  Assessment/Plan 1. Rhinitis, non-allergic - ongoing clear nasal congestion x 4 weeks - exam unremarkable today - do not suspect bacterial infection - discontinue Xyzal - restart Claritin 10 mg qhs - will try Azelastine HCT 0.1%- 2 sprays to each nostril BID x 7 days- hold Flonase during this time    Family/ staff Communication: plan discussed with patient and nurse  Labs/tests ordered:  none

## 2022-05-10 ENCOUNTER — Non-Acute Institutional Stay: Payer: Medicare PPO | Admitting: Internal Medicine

## 2022-05-10 ENCOUNTER — Encounter: Payer: Self-pay | Admitting: Internal Medicine

## 2022-05-10 VITALS — BP 128/74 | HR 61 | Temp 98.6°F | Ht 64.0 in | Wt 161.0 lb

## 2022-05-10 DIAGNOSIS — K529 Noninfective gastroenteritis and colitis, unspecified: Secondary | ICD-10-CM

## 2022-05-10 DIAGNOSIS — I480 Paroxysmal atrial fibrillation: Secondary | ICD-10-CM | POA: Diagnosis not present

## 2022-05-10 DIAGNOSIS — J310 Chronic rhinitis: Secondary | ICD-10-CM | POA: Diagnosis not present

## 2022-05-10 DIAGNOSIS — G3184 Mild cognitive impairment, so stated: Secondary | ICD-10-CM | POA: Diagnosis not present

## 2022-05-10 DIAGNOSIS — I10 Essential (primary) hypertension: Secondary | ICD-10-CM

## 2022-05-10 DIAGNOSIS — E7800 Pure hypercholesterolemia, unspecified: Secondary | ICD-10-CM

## 2022-05-10 DIAGNOSIS — R635 Abnormal weight gain: Secondary | ICD-10-CM

## 2022-05-10 NOTE — Progress Notes (Signed)
Location:  [place] of Service:  Clinic (12)  Provider:   Code Status: DNR Goals of Care:     05/10/2022    8:33 AM  Advanced Directives  Does Patient Have a Medical Advance Directive? Yes  Type of Paramedic of [place];Out of facility DNR (pink MOST or yellow form);Living will  Does patient want to make changes to medical advance directive? No - Patient declined  Copy of [place] in Chart? Yes - validated most recent copy scanned in chart (See row information)     Chief Complaint  Patient presents with   Medical Management of Chronic Issues    Medical Management of Chronic Issues. To discuss need for Covid and Flu or postpone if patient refuses.     HPI: Patient is a 88 y.o. female seen today for medical management of chronic diseases.    [place] in [place]  Patient has a history of PAF, HLD, chronic venous insufficiency, left carotid stenosis and hypertension   [place] lives in [place]  Allergic rhinitis Symptoms worse at night  C/o Frequent stools and ? Dark color with Blood   Gaining weight as not able to swim anymore Saw Cardiology Amiodarone reduced Xarelto reduced Crestor dose reduced She continues to stay stable    Past Medical History:  Diagnosis Date   Actinic keratosis 09/23/2012   Anal fissure and fistula 07/28/2008   Arthralgia of temporomandibular joint 12/30/2009   Atrial fibrillation ([place])    Atrial tachycardia ([place])    s/p ablation 2008. [facility] (R free wall Atach)   Atrophy of vulva 09/23/2012   Blepharochalasis 03/01/2011   Cardiac dysrhythmia, unspecified 11/10/2009   Dermatophytosis of nail 02/28/2008   Dizziness and giddiness 04/03/2007   Dysphagia, unspecified(787.20) 01/17/2012   Edema 11/21/2006   External hemorrhoids without mention of complication 78/29/5621   HTN (hypertension)    Hyperlipidemia    Insomnia, unspecified 11/18/2004   Internal hemorrhoids without mention of  complication 10/19/6576   Left bundle branch hemiblock    Loss of weight 03/05/2012   Myalgia and myositis, unspecified 01/05/2009   Orthostatic hypotension 03/22/2007   Other malaise and fatigue 03/05/2012   Pain in joint, site unspecified 02/28/2008   Palpitations 11/21/2006   Personality change due to conditions classified elsewhere    Pterygium eye, right 06/07/2020   Rectocele 09/23/2012   Restless legs syndrome (RLS)    Rosacea 09/23/2012   Sebaceous cyst 03/01/2011   Thumb pain 11/20/2012   Unspecified hereditary and idiopathic peripheral neuropathy     Past Surgical History:  Procedure Laterality Date   BLADDER SUSPENSION  1998   CATARACT EXTRACTION W/ INTRAOCULAR LENS IMPLANT Right 4/8//2008   Dr. Charise Killian   catheter ablation  2008   for atrial tachycardia   CYST EXCISION  1998   mucus cyst removal from finger   PTERYGIUM EXCISION  06/07/2020   TOTAL ABDOMINAL HYSTERECTOMY  2001   uterine bleeding    Allergies  Allergen Reactions   Erythromycin Nausea And Vomiting   Flecainide Acetate     Postural hypotension   Penicillins     Outpatient Encounter Medications as of 05/10/2022  Medication Sig   acetaminophen (TYLENOL) 500 MG tablet Take 1,000 mg by mouth every 8 (eight) hours as needed.   amiodarone (PACERONE) 200 MG tablet Take 0.5 tablets (100 mg total) by mouth daily.   amLODipine (NORVASC) 2.5 MG tablet Take 5 mg by mouth daily.   Artificial Tear  Solution (GENTEAL TEARS OP) Apply 1 drop to eye 2 (two) times daily.   cholecalciferol (VITAMIN D3) 25 MCG (1000 UNIT) tablet Take 1,000 Units by mouth daily.   dextromethorphan-guaiFENesin (ROBAFEN DM COUGH) 10-100 MG/5ML liquid Take 10 mLs by mouth every 6 (six) hours as needed for cough.   diclofenac Sodium (VOLTAREN) 1 % GEL Apply topically 2 (two) times daily. May self apply to left knee/hip prn discomfort   diltiazem (CARDIZEM) 60 MG tablet Take 1 tablet (60 mg total) by mouth 3 (three) times daily as needed (For heart rate  >110 bpm).   fluticasone (FLONASE) 50 MCG/ACT nasal spray Place 1 spray into both nostrils daily.   furosemide (LASIX) 20 MG tablet TAKE 1 TABLET ONCE DAILY AS NEEDED FOR SWELLING- TAKE 1/2 TABLET FOR LEG SWELLING AS NEEDED.   hydrALAZINE (APRESOLINE) 10 MG tablet Take 10 mg by mouth 3 (three) times daily as needed.   hydrALAZINE (APRESOLINE) 25 MG tablet Take 25 mg by mouth 2 (two) times daily.   levalbuterol (XOPENEX) 0.63 MG/3ML nebulizer solution Take 0.63 mg by nebulization 2 (two) times daily as needed for wheezing or shortness of breath.   loratadine (CLARITIN) 10 MG tablet Take 1 tablet (10 mg total) by mouth daily.   Metamucil Fiber CHEW Chew 2 Wafers by mouth at bedtime.   Multiple Vitamins-Minerals (MULTIVITAMIN ADULTS 50+) TABS Take 1 tablet by mouth daily.   Multiple Vitamins-Minerals (PRESERVISION AREDS PO) Take 2 tablets by mouth daily.    polyethylene glycol (MIRALAX / GLYCOLAX) 17 g packet Take 17 g by mouth daily as needed (and 1/2 up in the morning scheduled).   Rivaroxaban (XARELTO) 15 MG TABS tablet Take 1 tablet (15 mg total) by mouth daily with supper.   rosuvastatin (CRESTOR) 10 MG tablet Take 1 tablet (10 mg total) by mouth daily.   Sod Fluoride-Potassium Nitrate (PREVIDENT 5000 ENAMEL PROTECT) 1.1-5 % GEL Place 1 Application onto teeth at bedtime.   [DISCONTINUED] Azelastine HCl 0.15 % SOLN Place 2 sprays into the nose in the morning and at bedtime for 10 days.   [DISCONTINUED] Psyllium (METAMUCIL PO) Take by mouth at bedtime. Fiber water. 2 waters   No facility-administered encounter medications on file as of 05/10/2022.    Review of Systems:  Review of Systems  Constitutional:  Negative for activity change and appetite change.  HENT: Negative.    Respiratory:  Negative for cough and shortness of breath.   Cardiovascular:  Negative for leg swelling.  Gastrointestinal:  Negative for constipation.  Genitourinary: Negative.   Musculoskeletal:  Positive for gait  problem. Negative for arthralgias and myalgias.  Skin: Negative.   Neurological:  Negative for dizziness and weakness.  Psychiatric/Behavioral:  Negative for confusion, dysphoric mood and sleep disturbance.     Health Maintenance  Topic Date Due   COVID-19 Vaccine (5 - Moderna risk series) 08/16/2021   INFLUENZA VACCINE  03/14/2022   TETANUS/TDAP  07/14/2025   Pneumonia Vaccine 68+ Years old  Completed   DEXA SCAN  Completed   Zoster Vaccines- Shingrix  Completed   HPV VACCINES  Aged Out    Physical Exam: Vitals:   05/10/22 0829  BP: 128/74  Pulse: 61  Temp: 98.6 F (37 C)  SpO2: 97%  Weight: 161 lb (73 kg)  Height: '5\' 4"'$  (1.626 m)   Body mass index is 27.64 kg/m. Physical Exam Vitals reviewed.  Constitutional:      Appearance: Normal appearance.  HENT:     Head: Normocephalic.  Nose: Nose normal.     Mouth/Throat:     Mouth: Mucous membranes are moist.     Pharynx: Oropharynx is clear.  Eyes:     Pupils: Pupils are equal, round, and reactive to light.  Cardiovascular:     Rate and Rhythm: Normal rate. Rhythm irregular.     Pulses: Normal pulses.     Heart sounds: Normal heart sounds. No murmur heard. Pulmonary:     Effort: Pulmonary effort is normal.     Breath sounds: Normal breath sounds.  Abdominal:     General: Abdomen is flat. Bowel sounds are normal.     Palpations: Abdomen is soft.  Musculoskeletal:     Cervical back: Neck supple.     Comments: Chronic Venous changes bilateral  Skin:    General: Skin is warm.  Neurological:     General: No focal deficit present.     Mental Status: She is alert and oriented to person, place, and time.     Comments: Gait good with walker  Psychiatric:        Mood and Affect: Mood normal.        Thought Content: Thought content normal.     Labs reviewed: Basic Metabolic Panel: Recent Labs    07/20/21 0929 07/25/21 0000 12/05/21 0000  NA 141 143 142  K 3.4* 4.3 4.0  CL 99 104 104  CO2 28 27* 25*   GLUCOSE 119*  --   --   BUN 34* '13 19  19  '$ CREATININE 1.34* 0.7 0.9  CALCIUM 9.3 9.1 8.7  TSH  --   --  3.94   Liver Function Tests: Recent Labs    12/05/21 0000  AST 22  ALT 16  ALKPHOS 64  ALBUMIN 3.9   No results for input(s): "LIPASE", "AMYLASE" in the last 8760 hours. No results for input(s): "AMMONIA" in the last 8760 hours. CBC: Recent Labs    12/05/21 0000  WBC 4.3  HGB 11.7*  HCT 35*  PLT 249   Lipid Panel: Recent Labs    03/23/22 1505  CHOL 161  HDL 76*  LDLCALC 75  TRIG 52   No results found for: "HGBA1C"  Procedures since last visit: No results found.  Assessment/Plan 1. Rhinitis, non-allergic Add Azelastine spray at night with Flonase  2. Paroxysmal atrial fibrillation (HCC) Xarelto and  Amiodarone   3. Essential hypertension On Low dose Amlodipine and Hydralazine  4. Mild cognitive impairment Doing well in [place]  5. Pure hypercholesterolemia Crestor reduced per Cardiology  6. Frequent stools ? Getting Constipated again Does not want anything as concern about Stool Incontinence ? Blood she has noticed Will check CBC and Stool Occult 7. Weight gain Has gained 15 lbs in past few months with moving to [place] an dnot able to swim Wrote to reduce her portion sizes    Labs/tests ordered:  CBC,CMP  Next appt:  08/28/2022

## 2022-05-11 DIAGNOSIS — I480 Paroxysmal atrial fibrillation: Secondary | ICD-10-CM | POA: Diagnosis not present

## 2022-05-11 LAB — CBC AND DIFFERENTIAL
HCT: 37 (ref 36–46)
Hemoglobin: 12.4 (ref 12.0–16.0)
Platelets: 246 10*3/uL (ref 150–400)
WBC: 5.1

## 2022-05-11 LAB — CBC: RBC: 4.15 (ref 3.87–5.11)

## 2022-05-12 DIAGNOSIS — H353122 Nonexudative age-related macular degeneration, left eye, intermediate dry stage: Secondary | ICD-10-CM | POA: Diagnosis not present

## 2022-05-12 DIAGNOSIS — H43391 Other vitreous opacities, right eye: Secondary | ICD-10-CM | POA: Diagnosis not present

## 2022-05-12 DIAGNOSIS — H43813 Vitreous degeneration, bilateral: Secondary | ICD-10-CM | POA: Diagnosis not present

## 2022-05-12 DIAGNOSIS — H353211 Exudative age-related macular degeneration, right eye, with active choroidal neovascularization: Secondary | ICD-10-CM | POA: Diagnosis not present

## 2022-05-15 ENCOUNTER — Encounter: Payer: Self-pay | Admitting: Internal Medicine

## 2022-05-31 DIAGNOSIS — H02102 Unspecified ectropion of right lower eyelid: Secondary | ICD-10-CM | POA: Diagnosis not present

## 2022-05-31 DIAGNOSIS — H04521 Eversion of right lacrimal punctum: Secondary | ICD-10-CM | POA: Diagnosis not present

## 2022-05-31 DIAGNOSIS — H02105 Unspecified ectropion of left lower eyelid: Secondary | ICD-10-CM | POA: Diagnosis not present

## 2022-05-31 DIAGNOSIS — H04523 Eversion of bilateral lacrimal punctum: Secondary | ICD-10-CM | POA: Diagnosis not present

## 2022-05-31 DIAGNOSIS — H11822 Conjunctivochalasis, left eye: Secondary | ICD-10-CM | POA: Diagnosis not present

## 2022-06-09 DIAGNOSIS — H43813 Vitreous degeneration, bilateral: Secondary | ICD-10-CM | POA: Diagnosis not present

## 2022-06-09 DIAGNOSIS — H353122 Nonexudative age-related macular degeneration, left eye, intermediate dry stage: Secondary | ICD-10-CM | POA: Diagnosis not present

## 2022-06-09 DIAGNOSIS — H43391 Other vitreous opacities, right eye: Secondary | ICD-10-CM | POA: Diagnosis not present

## 2022-06-09 DIAGNOSIS — H353211 Exudative age-related macular degeneration, right eye, with active choroidal neovascularization: Secondary | ICD-10-CM | POA: Diagnosis not present

## 2022-06-10 DIAGNOSIS — I480 Paroxysmal atrial fibrillation: Secondary | ICD-10-CM | POA: Diagnosis not present

## 2022-06-10 LAB — BASIC METABOLIC PANEL
BUN: 18 (ref 4–21)
CO2: 22 (ref 13–22)
Chloride: 104 (ref 99–108)
Creatinine: 0.9 (ref 0.5–1.1)
Glucose: 92
Potassium: 3.8 mEq/L (ref 3.5–5.1)
Sodium: 142 (ref 137–147)

## 2022-06-10 LAB — HEPATIC FUNCTION PANEL
ALT: 20 U/L (ref 7–35)
AST: 32 (ref 13–35)
Alkaline Phosphatase: 79 (ref 25–125)
Bilirubin, Total: 0.3

## 2022-06-10 LAB — CBC: RBC: 4.65 (ref 3.87–5.11)

## 2022-06-10 LAB — COMPREHENSIVE METABOLIC PANEL
Albumin: 4.2 (ref 3.5–5.0)
Calcium: 8.8 (ref 8.7–10.7)
Globulin: 2.3
eGFR: 62

## 2022-06-10 LAB — CBC AND DIFFERENTIAL
HCT: 42 (ref 36–46)
Hemoglobin: 14.4 (ref 12.0–16.0)
Platelets: 232 10*3/uL (ref 150–400)
WBC: 4.5

## 2022-06-26 ENCOUNTER — Encounter: Payer: Self-pay | Admitting: Internal Medicine

## 2022-07-20 DIAGNOSIS — J3489 Other specified disorders of nose and nasal sinuses: Secondary | ICD-10-CM | POA: Diagnosis not present

## 2022-08-09 DIAGNOSIS — H353231 Exudative age-related macular degeneration, bilateral, with active choroidal neovascularization: Secondary | ICD-10-CM | POA: Diagnosis not present

## 2022-08-09 DIAGNOSIS — H43393 Other vitreous opacities, bilateral: Secondary | ICD-10-CM | POA: Diagnosis not present

## 2022-08-09 DIAGNOSIS — H43813 Vitreous degeneration, bilateral: Secondary | ICD-10-CM | POA: Diagnosis not present

## 2022-08-28 ENCOUNTER — Encounter: Payer: Medicare PPO | Admitting: Adult Health

## 2022-09-10 NOTE — Progress Notes (Unsigned)
Location:  [facility]  [id number]  Provider:  Cindi Carbon, ANP [facility] [phone]   Code Status: DNR Goals of Care:     05/10/2022    8:33 AM  Advanced Directives  Does Patient Have a Medical Advance Directive? Yes  Type of Paramedic of [place];Out of facility DNR (pink MOST or yellow form);Living will  Does patient want to make changes to medical advance directive? No - Patient declined  Copy of [place] in Chart? Yes - validated most recent copy scanned in chart (See row information)     No chief complaint on file.   HPI: Patient is a 89 y.o. female seen today for medical management of chronic diseases.    MCI:  MMSE 29/30 08/17/21 Currently living in [facility] [place] and doing well  Saw ENT due to issues with rhinitis and and found to have thick mucus, laryngoscopy done, fairly unremarkable other than mucus. Flonase recommended to continue, saline spray added.   Afib: follows with Dr Einar Gip  Bilateral carotid stenosis asymptomatic   HTN  HLD    Past Medical History:  Diagnosis Date   Actinic keratosis 09/23/2012   Anal fissure and fistula 07/28/2008   Arthralgia of temporomandibular joint 12/30/2009   Atrial fibrillation (HCC)    Atrial tachycardia ([place])    s/p ablation 2008. [facility] (R free wall Atach)   Atrophy of vulva 09/23/2012   Blepharochalasis 03/01/2011   Cardiac dysrhythmia, unspecified 11/10/2009   Dermatophytosis of nail 02/28/2008   Dizziness and giddiness 04/03/2007   Dysphagia, unspecified(787.20) 01/17/2012   Edema 11/21/2006   External hemorrhoids without mention of complication 40/81/4481   HTN (hypertension)    Hyperlipidemia    Insomnia, unspecified 11/18/2004   Internal hemorrhoids without mention of complication 8/56/3149   Left bundle branch hemiblock    Loss of weight 03/05/2012   Myalgia and myositis, unspecified 01/05/2009   Orthostatic hypotension 03/22/2007   Other malaise and  fatigue 03/05/2012   Pain in joint, site unspecified 02/28/2008   Palpitations 11/21/2006   Personality change due to conditions classified elsewhere    Pterygium eye, right 06/07/2020   Rectocele 09/23/2012   Restless legs syndrome (RLS)    Rosacea 09/23/2012   Sebaceous cyst 03/01/2011   Thumb pain 11/20/2012   Unspecified hereditary and idiopathic peripheral neuropathy     Past Surgical History:  Procedure Laterality Date   BLADDER SUSPENSION  1998   CATARACT EXTRACTION W/ INTRAOCULAR LENS IMPLANT Right 4/8//2008   Dr. Charise Killian   catheter ablation  2008   for atrial tachycardia   CYST EXCISION  1998   mucus cyst removal from finger   PTERYGIUM EXCISION  06/07/2020   TOTAL ABDOMINAL HYSTERECTOMY  2001   uterine bleeding    Allergies  Allergen Reactions   Erythromycin Nausea And Vomiting   Flecainide Acetate     Postural hypotension   Penicillins     Outpatient Encounter Medications as of 09/11/2022  Medication Sig   acetaminophen (TYLENOL) 500 MG tablet Take 1,000 mg by mouth every 8 (eight) hours as needed.   amiodarone (PACERONE) 200 MG tablet Take 0.5 tablets (100 mg total) by mouth daily.   amLODipine (NORVASC) 2.5 MG tablet Take 5 mg by mouth daily.   Artificial Tear Solution (GENTEAL TEARS OP) Apply 1 drop to eye 2 (two) times daily.   cholecalciferol (VITAMIN D3) 25 MCG (1000 UNIT) tablet Take 1,000 Units by mouth daily.   dextromethorphan-guaiFENesin (ROBAFEN DM COUGH) 10-100  MG/5ML liquid Take 10 mLs by mouth every 6 (six) hours as needed for cough.   diclofenac Sodium (VOLTAREN) 1 % GEL Apply topically 2 (two) times daily. May self apply to left knee/hip prn discomfort   diltiazem (CARDIZEM) 60 MG tablet Take 1 tablet (60 mg total) by mouth 3 (three) times daily as needed (For heart rate >110 bpm).   fluticasone (FLONASE) 50 MCG/ACT nasal spray Place 1 spray into both nostrils daily.   furosemide (LASIX) 20 MG tablet TAKE 1 TABLET ONCE DAILY AS NEEDED FOR SWELLING- TAKE  1/2 TABLET FOR LEG SWELLING AS NEEDED.   hydrALAZINE (APRESOLINE) 10 MG tablet Take 10 mg by mouth 3 (three) times daily as needed.   hydrALAZINE (APRESOLINE) 25 MG tablet Take 25 mg by mouth 2 (two) times daily.   levalbuterol (XOPENEX) 0.63 MG/3ML nebulizer solution Take 0.63 mg by nebulization 2 (two) times daily as needed for wheezing or shortness of breath.   loratadine (CLARITIN) 10 MG tablet Take 1 tablet (10 mg total) by mouth daily.   Metamucil Fiber CHEW Chew 2 Wafers by mouth at bedtime.   Multiple Vitamins-Minerals (MULTIVITAMIN ADULTS 50+) TABS Take 1 tablet by mouth daily.   Multiple Vitamins-Minerals (PRESERVISION AREDS PO) Take 2 tablets by mouth daily.    polyethylene glycol (MIRALAX / GLYCOLAX) 17 g packet Take 17 g by mouth daily as needed (and 1/2 up in the morning scheduled).   Rivaroxaban (XARELTO) 15 MG TABS tablet Take 1 tablet (15 mg total) by mouth daily with supper.   rosuvastatin (CRESTOR) 10 MG tablet Take 1 tablet (10 mg total) by mouth daily.   Sod Fluoride-Potassium Nitrate (PREVIDENT 5000 ENAMEL PROTECT) 1.1-5 % GEL Place 1 Application onto teeth at bedtime.   No facility-administered encounter medications on file as of 09/11/2022.    Review of Systems:  Review of Systems  Constitutional:  Negative for activity change, appetite change, chills, diaphoresis, fatigue, fever and unexpected weight change.  HENT:  Negative for congestion, ear discharge, ear pain and trouble swallowing.   Respiratory:  Negative for cough, shortness of breath and wheezing.   Cardiovascular:  Negative for chest pain, palpitations and leg swelling.  Gastrointestinal:  Negative for abdominal distention, abdominal pain, constipation and diarrhea.  Genitourinary:  Negative for difficulty urinating and dysuria.  Musculoskeletal:  Positive for gait problem. Negative for arthralgias, back pain, joint swelling and myalgias.  Neurological:  Negative for dizziness, tremors, seizures, syncope,  facial asymmetry, speech difficulty, weakness, light-headedness, numbness and headaches.  Psychiatric/Behavioral:  Positive for confusion. Negative for agitation and behavioral problems.     Health Maintenance  Topic Date Due   COVID-19 Vaccine (5 - 2023-24 season) 04/14/2022   Medicare Annual Wellness (AWV)  12/20/2022   DTaP/Tdap/Td (3 - Td or Tdap) 07/14/2025   Pneumonia Vaccine 55+ Years old  Completed   INFLUENZA VACCINE  Completed   DEXA SCAN  Completed   Zoster Vaccines- Shingrix  Completed   HPV VACCINES  Aged Out    Physical Exam: There were no vitals filed for this visit.  There is no height or weight on file to calculate BMI. Physical Exam Vitals and nursing note reviewed.  Constitutional:      General: She is not in acute distress.    Appearance: She is not diaphoretic.  HENT:     Head: Normocephalic and atraumatic.     Right Ear: There is impacted cerumen.     Left Ear: Tympanic membrane, ear canal and external ear normal.  Nose: Nose normal.     Mouth/Throat:     Mouth: Mucous membranes are moist.     Pharynx: Oropharynx is clear.  Eyes:     Conjunctiva/sclera: Conjunctivae normal.     Pupils: Pupils are equal, round, and reactive to light.  Neck:     Vascular: No JVD.  Cardiovascular:     Rate and Rhythm: Normal rate and regular rhythm.     Heart sounds: No murmur heard. Pulmonary:     Effort: Pulmonary effort is normal. No respiratory distress.     Breath sounds: Normal breath sounds. No wheezing.  Abdominal:     General: Abdomen is flat. Bowel sounds are normal. There is no distension.     Palpations: Abdomen is soft.     Tenderness: There is no abdominal tenderness.  Musculoskeletal:     Cervical back: No rigidity or tenderness.  Lymphadenopathy:     Cervical: No cervical adenopathy.  Skin:    General: Skin is warm and dry.  Neurological:     Mental Status: She is alert and oriented to person, place, and time.  Psychiatric:        Mood and  Affect: Mood normal.     Labs reviewed: Basic Metabolic Panel: Recent Labs    12/05/21 0000 06/10/22 0000  NA 142 142  K 4.0 3.8  CL 104 104  CO2 25* 22  BUN '19  19 18  '$ CREATININE 0.9 0.9  CALCIUM 8.7 8.8  TSH 3.94  --     Liver Function Tests: Recent Labs    12/05/21 0000 06/10/22 0000  AST 22 32  ALT 16 20  ALKPHOS 64 79  ALBUMIN 3.9 4.2   No results for input(s): "LIPASE", "AMYLASE" in the last 8760 hours. No results for input(s): "AMMONIA" in the last 8760 hours. CBC: Recent Labs    12/05/21 0000 05/11/22 0000 06/10/22 0000  WBC 4.3 5.1 4.5  HGB 11.7* 12.4 14.4  HCT 35* 37 42  PLT 249 246 232    Lipid Panel: Recent Labs    03/23/22 1505  CHOL 161  HDL 76*  LDLCALC 75  TRIG 52   No results found for: "HGBA1C"  Procedures since last visit: No results found.  Assessment/Plan  No problem-specific Assessment & Plan notes found for this encounter.    Right ear pain  Resolved Ok to swim with ear plugs Has apt in March with Dr. Benjamine Mola. Will discuss with staff how to get hre ot the pool (I.e. does she need assistance)  Labs/tests ordered:  * No order type specified * CBC BMP TSH LFTs Next appt:  11/14/2021

## 2022-09-11 ENCOUNTER — Encounter: Payer: Self-pay | Admitting: Adult Health

## 2022-09-11 ENCOUNTER — Non-Acute Institutional Stay: Payer: Medicare PPO | Admitting: Adult Health

## 2022-09-11 VITALS — BP 154/84 | HR 74 | Temp 97.6°F | Resp 16 | Ht 64.0 in | Wt 154.6 lb

## 2022-09-11 DIAGNOSIS — I10 Essential (primary) hypertension: Secondary | ICD-10-CM

## 2022-09-11 DIAGNOSIS — R152 Fecal urgency: Secondary | ICD-10-CM

## 2022-09-11 DIAGNOSIS — G3184 Mild cognitive impairment, so stated: Secondary | ICD-10-CM

## 2022-09-11 DIAGNOSIS — I4819 Other persistent atrial fibrillation: Secondary | ICD-10-CM

## 2022-09-11 DIAGNOSIS — J310 Chronic rhinitis: Secondary | ICD-10-CM

## 2022-09-11 DIAGNOSIS — R159 Full incontinence of feces: Secondary | ICD-10-CM | POA: Diagnosis not present

## 2022-09-11 DIAGNOSIS — E7800 Pure hypercholesterolemia, unspecified: Secondary | ICD-10-CM | POA: Diagnosis not present

## 2022-09-11 DIAGNOSIS — I6522 Occlusion and stenosis of left carotid artery: Secondary | ICD-10-CM | POA: Diagnosis not present

## 2022-09-11 NOTE — Patient Instructions (Signed)
Recommend using nasal saline spray every 4 hours for dry nares  Recommend stopping metamucil, call back if still having loose stools.

## 2022-09-13 ENCOUNTER — Encounter: Payer: Self-pay | Admitting: Adult Health

## 2022-09-14 DIAGNOSIS — E785 Hyperlipidemia, unspecified: Secondary | ICD-10-CM | POA: Diagnosis not present

## 2022-09-14 DIAGNOSIS — R32 Unspecified urinary incontinence: Secondary | ICD-10-CM | POA: Diagnosis not present

## 2022-09-14 DIAGNOSIS — G3184 Mild cognitive impairment, so stated: Secondary | ICD-10-CM | POA: Diagnosis not present

## 2022-09-14 DIAGNOSIS — M8580 Other specified disorders of bone density and structure, unspecified site: Secondary | ICD-10-CM | POA: Diagnosis not present

## 2022-09-14 DIAGNOSIS — I10 Essential (primary) hypertension: Secondary | ICD-10-CM | POA: Diagnosis not present

## 2022-09-14 DIAGNOSIS — J309 Allergic rhinitis, unspecified: Secondary | ICD-10-CM | POA: Diagnosis not present

## 2022-09-14 DIAGNOSIS — K5900 Constipation, unspecified: Secondary | ICD-10-CM | POA: Diagnosis not present

## 2022-09-14 DIAGNOSIS — R2681 Unsteadiness on feet: Secondary | ICD-10-CM | POA: Diagnosis not present

## 2022-09-14 DIAGNOSIS — M1990 Unspecified osteoarthritis, unspecified site: Secondary | ICD-10-CM | POA: Diagnosis not present

## 2022-09-22 ENCOUNTER — Ambulatory Visit: Payer: Medicare PPO | Admitting: Cardiology

## 2022-09-26 ENCOUNTER — Ambulatory Visit: Payer: Medicare PPO | Admitting: Cardiology

## 2022-09-26 ENCOUNTER — Encounter: Payer: Self-pay | Admitting: Cardiology

## 2022-09-26 VITALS — BP 140/61 | HR 70 | Ht 64.0 in | Wt 160.2 lb

## 2022-09-26 DIAGNOSIS — I6523 Occlusion and stenosis of bilateral carotid arteries: Secondary | ICD-10-CM

## 2022-09-26 DIAGNOSIS — I10 Essential (primary) hypertension: Secondary | ICD-10-CM

## 2022-09-26 DIAGNOSIS — E7800 Pure hypercholesterolemia, unspecified: Secondary | ICD-10-CM

## 2022-09-26 DIAGNOSIS — I480 Paroxysmal atrial fibrillation: Secondary | ICD-10-CM | POA: Diagnosis not present

## 2022-09-26 MED ORDER — LOSARTAN POTASSIUM 25 MG PO TABS: 25.0000 mg | ORAL_TABLET | Freq: Every evening | ORAL | 2 refills | Status: DC

## 2022-09-26 NOTE — Progress Notes (Signed)
Primary Physician/Referring:  [name], MD  Patient ID: Rebekah Graham, female    DOB: Dec 05, 1928, 89 y.o.   MRN: [id number]  Chief Complaint  Patient presents with   Persistent atrial fibrillation   Follow-up   HPI:     Tucker  is a 89 y.o. Caucasian female with history of paroxysmal atrial fibrillation, hypertension and hyperlipidemia.    Patient presents for office visit for management of hypertension.  Except for noticing elevated blood pressure, she has no specific complaints today.  Past Medical History:  Diagnosis Date   Actinic keratosis 09/23/2012   Anal fissure and fistula 07/28/2008   Arthralgia of temporomandibular joint 12/30/2009   Atrial fibrillation (HCC)    Atrial tachycardia    s/p ablation 2008. [facility] (R free wall Atach)   Atrophy of vulva 09/23/2012   Blepharochalasis 03/01/2011   Cardiac dysrhythmia, unspecified 11/10/2009   Dermatophytosis of nail 02/28/2008   Dizziness and giddiness 04/03/2007   Dysphagia, unspecified(787.20) 01/17/2012   Edema 11/21/2006   External hemorrhoids without mention of complication [id number]   HTN (hypertension)    Hyperlipidemia    Insomnia, unspecified 11/18/2004   Internal hemorrhoids without mention of complication [id number]   Left bundle branch hemiblock    Loss of weight 03/05/2012   Myalgia and myositis, unspecified 01/05/2009   Orthostatic hypotension 03/22/2007   Other malaise and fatigue 03/05/2012   Pain in joint, site unspecified 02/28/2008   Palpitations 11/21/2006   Personality change due to conditions classified elsewhere    Pterygium eye, right 06/07/2020   Rectocele 09/23/2012   Restless legs syndrome (RLS)    Rosacea 09/23/2012   Sebaceous cyst 03/01/2011   Thumb pain 11/20/2012   Unspecified hereditary and idiopathic peripheral neuropathy    Past Surgical History:  Procedure Laterality Date   BLADDER SUSPENSION  1998   CATARACT EXTRACTION W/ INTRAOCULAR LENS IMPLANT Right 4/8//2008   Dr. Charise Killian    catheter ablation  2008   for atrial tachycardia   CYST EXCISION  1998   mucus cyst removal from finger   PTERYGIUM EXCISION  06/07/2020   TOTAL ABDOMINAL HYSTERECTOMY  2001   uterine bleeding   Social History   Tobacco Use   Smoking status: Never   Smokeless tobacco: Never   Tobacco comments:    tobacco  - none  Substance Use Topics   Alcohol use: Yes    Comment: 1-2 glasses of wine/gin daily     Family History  Problem Relation Age of Onset   Heart disease Mother    Heart disease Father    Cancer Sister        breast    ROS  Review of Systems  Cardiovascular:  Positive for leg swelling (chronic). Negative for chest pain and dyspnea on exertion.   Objective  Blood pressure (!) 140/61, pulse 70, height 5' 4"$  (1.626 m), weight 160 lb 3.2 oz (72.7 kg), SpO2 93 %. Body mass index is 27.5 kg/m.      09/26/2022   11:33 AM 09/26/2022   11:18 AM 09/11/2022    3:57 PM  Vitals with BMI  Height  5' 4"$    Weight  160 lbs 3 oz   BMI  [id number]   Systolic [id number]  Diastolic 61 69 84  Pulse 70 70      Physical Exam Vitals reviewed.  Neck:     Vascular: Carotid bruit (bilateral) present. No JVD.  Cardiovascular:     Rate and Rhythm: Normal rate  and regular rhythm.     Pulses: Intact distal pulses.     Heart sounds: S1 normal and S2 normal. No murmur heard.    No gallop.  Pulmonary:     Effort: Pulmonary effort is normal.     Breath sounds: Normal breath sounds.  Abdominal:     General: Bowel sounds are normal.     Palpations: Abdomen is soft.  Musculoskeletal:     Right lower leg: Edema (1-2 plus) present.     Left lower leg: Edema (trace) present.    Laboratory examination:   Lab Results  Component Value Date   NA 142 06/10/2022   K 3.8 06/10/2022   CO2 22 06/10/2022   GLUCOSE 119 (H) 07/20/2021   BUN 18 06/10/2022   CREATININE 0.9 06/10/2022   CALCIUM 8.8 06/10/2022   EGFR 62 06/10/2022       Latest Ref Rng & Units 06/10/2022   12:00 AM 12/05/2021    12:00 AM 07/25/2021   12:00 AM  CMP  BUN 4 - 21 18     19       19     13      $ Creatinine 0.5 - 1.1 0.9     0.9     0.7      Sodium 137 - 147 142     142     143      Potassium 3.5 - 5.1 mEq/L 3.8     4.0     4.3      Chloride 99 - 108 104     104     104      CO2 13 - 22 22     25     27      $ Calcium 8.7 - 10.7 8.8     8.7     9.1      Alkaline Phos 25 - 125 79     64       AST 13 - 35 32     22       ALT 7 - 35 U/L 20     16          This result is from an external source.   Multiple values from one day are sorted in reverse-chronological order      Latest Ref Rng & Units 06/10/2022   12:00 AM 05/11/2022   12:00 AM 12/05/2021   12:00 AM  CBC  WBC  4.5     5.1     4.3      Hemoglobin 12.0 - 16.0 14.4     12.4     11.7      Hematocrit 36 - 46 42     37     35      Platelets 150 - 400 K/uL 232     246     249         This result is from an external source.   Lipid Panel     Component Value Date/Time   CHOL 161 03/23/2022 1505   TRIG 52 03/23/2022 1505   HDL 76 (A) 03/23/2022 1505   LDLCALC 75 03/23/2022 1505   TSH Recent Labs    12/05/21 0000  TSH 3.94    Allergies   Allergies  Allergen Reactions   Erythromycin Nausea And Vomiting   Flecainide Acetate     Postural hypotension   Penicillins      Medications  Current Outpatient Medications:    acetaminophen (TYLENOL) 500 MG tablet, Take 1,000 mg by mouth every 8 (eight) hours as needed., Disp: , Rfl:    amiodarone (PACERONE) 200 MG tablet, Take 0.5 tablets (100 mg total) by mouth daily., Disp: , Rfl:    amLODipine (NORVASC) 5 MG tablet, Take 5 mg by mouth daily., Disp: , Rfl:    Artificial Tear Solution (GENTEAL TEARS OP), Apply 1 drop to eye 2 (two) times daily., Disp: , Rfl:    cholecalciferol (VITAMIN D3) 25 MCG (1000 UNIT) tablet, Take 1,000 Units by mouth daily., Disp: , Rfl:    dextromethorphan-guaiFENesin (ROBAFEN DM COUGH) 10-100 MG/5ML liquid, Take 10 mLs by mouth every 6 (six) hours as needed  for cough., Disp: , Rfl:    diclofenac Sodium (VOLTAREN) 1 % GEL, Apply topically 2 (two) times daily. May self apply to left knee/hip prn discomfort, Disp: , Rfl:    diltiazem (CARDIZEM) 60 MG tablet, Take 1 tablet (60 mg total) by mouth 3 (three) times daily as needed (For heart rate >110 bpm)., Disp: 30 tablet, Rfl: 3   fluticasone (FLONASE) 50 MCG/ACT nasal spray, Place 1 spray into both nostrils daily., Disp: , Rfl:    furosemide (LASIX) 20 MG tablet, TAKE 1 TABLET ONCE DAILY AS NEEDED FOR SWELLING- TAKE 1/2 TABLET FOR LEG SWELLING AS NEEDED., Disp: 60 tablet, Rfl: 0   hydrALAZINE (APRESOLINE) 10 MG tablet, Take 10 mg by mouth 3 (three) times daily as needed., Disp: , Rfl:    hydrALAZINE (APRESOLINE) 25 MG tablet, Take 25 mg by mouth 2 (two) times daily., Disp: , Rfl:    levalbuterol (XOPENEX) 0.63 MG/3ML nebulizer solution, Take 0.63 mg by nebulization 2 (two) times daily as needed for wheezing or shortness of breath., Disp: , Rfl:    loratadine (CLARITIN) 10 MG tablet, Take 1 tablet (10 mg total) by mouth daily., Disp: 30 tablet, Rfl: 11   losartan (COZAAR) 25 MG tablet, Take 1 tablet (25 mg total) by mouth every evening., Disp: 30 tablet, Rfl: 2   Multiple Vitamins-Minerals (MULTIVITAMIN ADULTS 50+) TABS, Take 1 tablet by mouth daily., Disp: , Rfl:    Multiple Vitamins-Minerals (PRESERVISION AREDS PO), Take 2 tablets by mouth daily. , Disp: , Rfl:    polyethylene glycol (MIRALAX / GLYCOLAX) 17 g packet, Take 17 g by mouth daily as needed (and 1/2 up in the morning scheduled)., Disp: , Rfl:    Rivaroxaban (XARELTO) 15 MG TABS tablet, Take 1 tablet (15 mg total) by mouth daily with supper., Disp: 30 tablet, Rfl: 12   rosuvastatin (CRESTOR) 10 MG tablet, Take 1 tablet (10 mg total) by mouth daily., Disp: , Rfl:    Sod Fluoride-Potassium Nitrate (PREVIDENT 5000 ENAMEL PROTECT) 1.1-5 % GEL, Place 1 Application onto teeth at bedtime., Disp: , Rfl:   Radiology:  No results found.  Cardiac  Studies:   Echo- 07/13/2015 1. Left ventricle cavity is normal in size. Mild concentric hypertrophy of the left ventricle. Normal global wall motion. Calculated EF 66%. 2. Left atrial cavity is mildly dilated. 3. Trace aortic regurgitation. 4. Mild mitral regurgitation. 5. Mild tricuspid regurgitation. No evidence of pulmonary hypertension.  Carotid artery duplex 10/14/2020: Minimal stenosis in the right internal carotid artery (minimal). Stenosis in the right external carotid artery (<50%). Stenosis in the left internal carotid artery (16-49%). Stenosis in the left external carotid artery (<50%). Antegrade right vertebral artery flow. Antegrade left vertebral artery flow. Compared to 05/03/2020, left ICA stenosis of 50-69% is now  16-49%.  Upper limit of the spectrum.  Overall, no significant change. Follow up in one year is appropriate if clinically indicated.  EKG:    EKG 09/26/2022: Sinus bradycardia at rate of 57 beats minute, normal axis.  No evidence of ischemia, normal EKG.  Compared to 03/22/2022, sinus with frequent PACs.  Assessment     ICD-10-CM   1. Paroxysmal atrial fibrillation (HCC)  I48.0 EKG 12-Lead    2. Essential hypertension  I10 losartan (COZAAR) 25 MG tablet    3. Hypercholesteremia  E78.00     4. Asymptomatic bilateral carotid artery stenosis  I65.23        Medications Discontinued During This Encounter  Medication Reason   metoprolol tartrate (LOPRESSOR) 50 MG tablet Patient Preference     Meds ordered this encounter  Medications   losartan (COZAAR) 25 MG tablet    Sig: Take 1 tablet (25 mg total) by mouth every evening.    Dispense:  30 tablet    Refill:  2  This patients CHA2DS2-VASc Score 4 (HTN, Age, F)and yearly risk of stroke 4.8%.   Recommendations:    Montie Abbasi  is a 89 y.o. Caucasian female with history of paroxysmal atrial fibrillation, hypertension and hyperlipidemia and stage IIIa chronic kidney disease presents here for a 32-month office visit.  1. Paroxysmal atrial fibrillation (HCC) Patient has been maintaining sinus rhythm on low-dose of amiodarone.  She will need TSH to be done for follow-up.  I will send a note to Dr. GLyndel Safe  2. Essential hypertension Blood pressure has been slightly elevated both at home and also here, her renal function has remained stable, previously she had stage IIIa-B kidney disease but this is all normalized by the latest labs, I will start her on 25 mg of losartan and check BMP in [redacted] weeks along with TSH.  If renal function deteriorates, we could certainly discontinue this and switch her over to amlodipine at 10 mg.  In view of advanced age, do not want to use a beta-blocker.  3. Hypercholesteremia Patient is tolerating 10 mg of Crestor, lipids are controlled.  4. Asymptomatic bilateral carotid artery stenosis She has very mild carotid artery stenosis, no further evaluation is indicated as this has remained stable over many years.  She is on a statin, continue the same.  I will see him back in 6 months for follow-up.   JAdrian Prows MD, [facility]2/13/2024, 12:02 PM Office: [phone]: [phone]: [phone]

## 2022-09-27 DIAGNOSIS — H5000 Unspecified esotropia: Secondary | ICD-10-CM | POA: Diagnosis not present

## 2022-09-27 DIAGNOSIS — H353132 Nonexudative age-related macular degeneration, bilateral, intermediate dry stage: Secondary | ICD-10-CM | POA: Diagnosis not present

## 2022-09-27 DIAGNOSIS — Z961 Presence of intraocular lens: Secondary | ICD-10-CM | POA: Diagnosis not present

## 2022-09-27 DIAGNOSIS — H11003 Unspecified pterygium of eye, bilateral: Secondary | ICD-10-CM | POA: Diagnosis not present

## 2022-09-27 DIAGNOSIS — H52203 Unspecified astigmatism, bilateral: Secondary | ICD-10-CM | POA: Diagnosis not present

## 2022-10-02 ENCOUNTER — Encounter: Payer: Self-pay | Admitting: Adult Health

## 2022-10-02 NOTE — Progress Notes (Unsigned)
Location:  [facility] Room Number: [id number] Place of Service:  ALF [phone]) Provider:  Royal Hawthorn, NP   [name], MD  Patient Care Team: [name], MD as PCP - General (Internal Medicine) [facility], [facility] Graham, [place] Donovan, NP as Nurse Practitioner (Geriatric Medicine) Adrian Prows, MD as Consulting Physician (Cardiology) Earlean Polka, MD as Consulting Physician (Ophthalmology)  Extended Emergency Contact Information Primary Emergency Contact: Rebekah Graham of [place] Phone: [phone] Work Phone: [phone] Relation: Son Secondary Emergency Contact: Rebekah Graham Mobile Phone: [phone] Relation: Friend  Code Status:  DNR Goals of care: Advanced Directive information    10/02/2022    9:39 AM  Advanced Directives  Does Patient Have a Medical Advance Directive? Yes  Type of Paramedic of [place];Living will;Out of facility DNR (pink MOST or yellow form)  Does patient want to make changes to medical advance directive? No - Patient declined  Copy of [place] in Chart? Yes - validated most recent copy scanned in chart (See row information)     Chief Complaint  Patient presents with   Acute Visit    Fecal urgency.    HPI:  Pt is a 89 y.o. female seen today for an acute visit for    Past Medical History:  Diagnosis Date   Actinic keratosis 09/23/2012   Anal fissure and fistula 07/28/2008   Arthralgia of temporomandibular joint 12/30/2009   Atrial fibrillation (HCC)    Atrial tachycardia    s/p ablation 2008. [facility] (R free wall Atach)   Atrophy of vulva 09/23/2012   Blepharochalasis 03/01/2011   Cardiac dysrhythmia, unspecified 11/10/2009   Dermatophytosis of nail 02/28/2008   Dizziness and giddiness 04/03/2007   Dysphagia, unspecified(787.20) 01/17/2012   Edema 11/21/2006   External hemorrhoids without mention of complication 123XX123   HTN  (hypertension)    Hyperlipidemia    Insomnia, unspecified 11/18/2004   Internal hemorrhoids without mention of complication 99991111   Left bundle branch hemiblock    Loss of weight 03/05/2012   Myalgia and myositis, unspecified 01/05/2009   Orthostatic hypotension 03/22/2007   Other malaise and fatigue 03/05/2012   Pain in joint, site unspecified 02/28/2008   Palpitations 11/21/2006   Personality change due to conditions classified elsewhere    Pterygium eye, right 06/07/2020   Rectocele 09/23/2012   Restless legs syndrome (RLS)    Rosacea 09/23/2012   Sebaceous cyst 03/01/2011   Thumb pain 11/20/2012   Unspecified hereditary and idiopathic peripheral neuropathy    Past Surgical History:  Procedure Laterality Date   BLADDER SUSPENSION  1998   CATARACT EXTRACTION W/ INTRAOCULAR LENS IMPLANT Right 4/8//2008   Dr. Charise Killian   catheter ablation  2008   for atrial tachycardia   CYST EXCISION  1998   mucus cyst removal from finger   PTERYGIUM EXCISION  06/07/2020   TOTAL ABDOMINAL HYSTERECTOMY  2001   uterine bleeding    Allergies  Allergen Reactions   Erythromycin Nausea And Vomiting   Flecainide Acetate     Postural hypotension   Penicillins     Outpatient Encounter Medications as of 10/02/2022  Medication Sig   acetaminophen (TYLENOL) 500 MG tablet Take 1,000 mg by mouth every 8 (eight) hours as needed.   amiodarone (PACERONE) 200 MG tablet Take 0.5 tablets (100 mg total) by mouth daily.   amLODipine (NORVASC) 5 MG tablet Take 5 mg by mouth daily.   Artificial Tear Solution (GENTEAL TEARS OP) Apply  1 drop to eye 2 (two) times daily.   cholecalciferol (VITAMIN D3) 25 MCG (1000 UNIT) tablet Take 1,000 Units by mouth daily.   dextromethorphan-guaiFENesin (ROBAFEN DM COUGH) 10-100 MG/5ML liquid Take 10 mLs by mouth every 6 (six) hours as needed for cough.   diclofenac Sodium (VOLTAREN) 1 % GEL Apply topically 2 (two) times daily. May self apply to left knee/hip prn discomfort   diltiazem  (CARDIZEM) 60 MG tablet Take 1 tablet (60 mg total) by mouth 3 (three) times daily as needed (For heart rate >110 bpm).   fluticasone (FLONASE) 50 MCG/ACT nasal spray Place 1 spray into both nostrils daily.   furosemide (LASIX) 20 MG tablet TAKE 1 TABLET ONCE DAILY AS NEEDED FOR SWELLING- TAKE 1/2 TABLET FOR LEG SWELLING AS NEEDED.   hydrALAZINE (APRESOLINE) 10 MG tablet Take 10 mg by mouth 3 (three) times daily as needed.   hydrALAZINE (APRESOLINE) 25 MG tablet Take 25 mg by mouth 2 (two) times daily.   levalbuterol (XOPENEX) 0.63 MG/3ML nebulizer solution Take 0.63 mg by nebulization 2 (two) times daily as needed for wheezing or shortness of breath.   loratadine (CLARITIN) 10 MG tablet Take 1 tablet (10 mg total) by mouth daily.   losartan (COZAAR) 25 MG tablet Take 1 tablet (25 mg total) by mouth every evening.   Multiple Vitamins-Minerals (MULTIVITAMIN ADULTS 50+) TABS Take 1 tablet by mouth daily.   Multiple Vitamins-Minerals (PRESERVISION AREDS PO) Take 2 tablets by mouth daily.    polyethylene glycol (MIRALAX / GLYCOLAX) 17 g packet Take 17 g by mouth daily as needed (and 1/2 up in the morning scheduled).   Rivaroxaban (XARELTO) 15 MG TABS tablet Take 1 tablet (15 mg total) by mouth daily with supper.   rosuvastatin (CRESTOR) 10 MG tablet Take 1 tablet (10 mg total) by mouth daily.   Sod Fluoride-Potassium Nitrate (PREVIDENT 5000 ENAMEL PROTECT) 1.1-5 % GEL Place 1 Application onto teeth at bedtime.   sodium chloride (OCEAN) 0.65 % nasal spray Place 2 sprays into the nose every 4 (four) hours as needed for congestion.   No facility-administered encounter medications on file as of 10/02/2022.    Review of Systems  Immunization History  Administered Date(s) Administered   Fluad Quad(high Dose 65+) 05/19/2022   Influenza Whole 06/12/2012   Influenza, High Dose Seasonal PF 05/23/2019, 06/11/2020   Influenza,inj,Quad PF,6+ Mos 05/13/2013, 06/07/2018   Influenza-Unspecified 05/27/2014,  04/24/2015, 04/13/2016, 04/24/2017, 06/11/2020, 06/11/2021   Moderna Covid-19 Vaccine Bivalent Booster 45yr & up 06/21/2021   Moderna SARS-COV2 Booster Vaccination 04/18/2021   Moderna Sars-Covid-2 Vaccination 08/26/2019, 09/23/2019, 06/29/2020, 06/14/2022   Pneumococcal Conjugate-13 05/27/2014   Pneumococcal Polysaccharide-23 08/14/1997   Td 04/15/2011   Tdap 07/15/2015   Zoster Recombinat (Shingrix) 02/12/2017, 06/28/2017   Zoster, Live 08/15/2007   Pertinent  Health Maintenance Due  Topic Date Due   INFLUENZA VACCINE  Completed   DEXA SCAN  Completed      09/05/2021    2:14 PM 12/07/2021   10:00 AM 12/19/2021   11:07 AM 09/11/2022    3:54 PM 10/02/2022    9:39 AM  [place] past [place]? 0 0 0 0 0  Was there an injury with Fall? 0 0 0 0 0  Fall Risk Category Calculator 0 0 0 0 0  Fall Risk Category (Retired) Low Low Low    (RETIRED) Patient Fall Risk Level Low fall risk Low fall risk Low fall risk    Patient at Risk for Falls  Due to No Fall Risks No Fall Risks No Fall Risks    Fall risk Follow up Falls evaluation completed Falls evaluation completed Falls evaluation completed Falls evaluation completed Falls evaluation completed   Functional Status Survey:    Vitals:   10/02/22 0931  BP: 120/65  Pulse: 61  Resp: 18  Temp: 97.7 F (36.5 C)  TempSrc: Temporal  SpO2: 94%  Weight: 156 lb 12.8 oz (71.1 kg)  Height: 5' 4"$  (1.626 m)   Body mass index is 26.91 kg/m. Physical Exam  Labs reviewed: Recent Labs    12/05/21 0000 06/10/22 0000  NA 142 142  K 4.0 3.8  CL 104 104  CO2 25* 22  BUN 19  19 18  $ CREATININE 0.9 0.9  CALCIUM 8.7 8.8   Recent Labs    12/05/21 0000 06/10/22 0000  AST 22 32  ALT 16 20  ALKPHOS 64 79  ALBUMIN 3.9 4.2   Recent Labs    12/05/21 0000 05/11/22 0000 06/10/22 0000  WBC 4.3 5.1 4.5  HGB 11.7* 12.4 14.4  HCT 35* 37 42  PLT 249 246 232   Lab Results  Component Value Date   TSH 3.94 12/05/2021   No results  found for: "HGBA1C" Lab Results  Component Value Date   CHOL 161 03/23/2022   HDL 76 (A) 03/23/2022   LDLCALC 75 03/23/2022   TRIG 52 03/23/2022    Significant Diagnostic Results in last 30 days:  No results found.  Assessment/Plan There are no diagnoses linked to this encounter.   Family/ staff Communication: ***  Labs/tests ordered:  ***

## 2022-10-03 NOTE — Progress Notes (Signed)
This encounter was created in error - please disregard.

## 2022-10-05 ENCOUNTER — Encounter: Payer: Self-pay | Admitting: Adult Health

## 2022-10-05 ENCOUNTER — Non-Acute Institutional Stay: Payer: Medicare PPO | Admitting: Adult Health

## 2022-10-05 DIAGNOSIS — T148XXA Other injury of unspecified body region, initial encounter: Secondary | ICD-10-CM

## 2022-10-05 DIAGNOSIS — R159 Full incontinence of feces: Secondary | ICD-10-CM

## 2022-10-05 DIAGNOSIS — R152 Fecal urgency: Secondary | ICD-10-CM | POA: Diagnosis not present

## 2022-10-05 NOTE — Progress Notes (Signed)
Location:  [facility] Room Number: AL/523/A Place of Service:  ALF [phone]) Provider:  Provider:  Royal Hawthorn, NP   Patient Care Team: [name], MD as PCP - General (Internal Medicine) [facility], [facility] Krell, Claudette T, NP as Nurse Practitioner (Geriatric Medicine) Adrian Prows, MD as Consulting Physician (Cardiology) Earlean Polka, MD as Consulting Physician (Ophthalmology)  Extended Emergency Contact Information Primary Emergency Contact: [name] of [place] Phone: [phone] Work Phone: [phone] Relation: Son Secondary Emergency Contact: [employer] Mobile Phone: [phone] Relation: Friend  Code Status:  DNR Goals of care: Advanced Directive information    10/02/2022    9:39 AM  Advanced Directives  Does Patient Have a Medical Advance Directive? Yes  Type of Paramedic of [place];Living will;Out of facility DNR (pink MOST or yellow form)  Does patient want to make changes to medical advance directive? No - Patient declined  Copy of [place] in Chart? Yes - validated most recent copy scanned in chart (See row information)     Chief Complaint  Patient presents with   Acute Visit    Patient is being seen for fecal incontinence    HPI:  Pt is a 89 y.o. female seen today for an acute visit for fecal incontinence  She reports fecal incontinence that occurs when standing up, frequently in small amts effecting her QOL. Sometimes the stools is loose other times slightly formed. Denies any rectal pain or prolapse. No issues with constipation.    Denies any abd pain or fever. Other wise feels well  Also had a fall and fell on her left hip due to loss of balance.  She is able to bear weight fine and denies any pain. Does have a hematoma to the left hip and bruising. PT is getting involved.    Past Medical History:  Diagnosis Date    Actinic keratosis 09/23/2012   Anal fissure and fistula 07/28/2008   Arthralgia of temporomandibular joint 12/30/2009   Atrial fibrillation (HCC)    Atrial tachycardia    s/p ablation 2008. [facility] (R free wall Atach)   Atrophy of vulva 09/23/2012   Blepharochalasis 03/01/2011   Cardiac dysrhythmia, unspecified 11/10/2009   Dermatophytosis of nail 02/28/2008   Dizziness and giddiness 04/03/2007   Dysphagia, unspecified(787.20) 01/17/2012   Edema 11/21/2006   External hemorrhoids without mention of complication 123XX123   HTN (hypertension)    Hyperlipidemia    Insomnia, unspecified 11/18/2004   Internal hemorrhoids without mention of complication 99991111   Left bundle branch hemiblock    Loss of weight 03/05/2012   Myalgia and myositis, unspecified 01/05/2009   Orthostatic hypotension 03/22/2007   Other malaise and fatigue 03/05/2012   Pain in joint, site unspecified 02/28/2008   Palpitations 11/21/2006   Personality change due to conditions classified elsewhere    Pterygium eye, right 06/07/2020   Rectocele 09/23/2012   Restless legs syndrome (RLS)    Rosacea 09/23/2012   Sebaceous cyst 03/01/2011   Thumb pain 11/20/2012   Unspecified hereditary and idiopathic peripheral neuropathy    Past Surgical History:  Procedure Laterality Date   BLADDER SUSPENSION  1998   CATARACT EXTRACTION W/ INTRAOCULAR LENS IMPLANT Right 4/8//2008   Dr. Charise Killian   catheter ablation  2008   for atrial tachycardia   CYST EXCISION  1998   mucus cyst removal from finger   PTERYGIUM EXCISION  06/07/2020   TOTAL ABDOMINAL HYSTERECTOMY  2001   uterine bleeding  Allergies  Allergen Reactions   Erythromycin Nausea And Vomiting   Flecainide Acetate     Postural hypotension   Penicillins     Outpatient Encounter Medications as of 10/05/2022  Medication Sig   acetaminophen (TYLENOL) 500 MG tablet Take 1,000 mg by mouth every 8 (eight) hours as needed.   amiodarone (PACERONE) 200 MG tablet Take 0.5 tablets (100 mg  total) by mouth daily.   amLODipine (NORVASC) 5 MG tablet Take 5 mg by mouth daily.   Artificial Tear Solution (GENTEAL TEARS OP) Apply 1 drop to eye 2 (two) times daily.   cholecalciferol (VITAMIN D3) 25 MCG (1000 UNIT) tablet Take 1,000 Units by mouth daily.   dextromethorphan-guaiFENesin (ROBAFEN DM COUGH) 10-100 MG/5ML liquid Take 10 mLs by mouth every 6 (six) hours as needed for cough.   diclofenac Sodium (VOLTAREN) 1 % GEL Apply topically 2 (two) times daily. May self apply to left knee/hip prn discomfort   diltiazem (CARDIZEM) 60 MG tablet Take 1 tablet (60 mg total) by mouth 3 (three) times daily as needed (For heart rate >110 bpm).   fluticasone (FLONASE) 50 MCG/ACT nasal spray Place 1 spray into both nostrils daily.   furosemide (LASIX) 20 MG tablet TAKE 1 TABLET ONCE DAILY AS NEEDED FOR SWELLING- TAKE 1/2 TABLET FOR LEG SWELLING AS NEEDED.   hydrALAZINE (APRESOLINE) 10 MG tablet Take 10 mg by mouth 3 (three) times daily as needed.   hydrALAZINE (APRESOLINE) 25 MG tablet Take 25 mg by mouth 2 (two) times daily.   levalbuterol (XOPENEX) 0.63 MG/3ML nebulizer solution Take 0.63 mg by nebulization 2 (two) times daily as needed for wheezing or shortness of breath.   loratadine (CLARITIN) 10 MG tablet Take 1 tablet (10 mg total) by mouth daily.   losartan (COZAAR) 25 MG tablet Take 1 tablet (25 mg total) by mouth every evening.   Multiple Vitamins-Minerals (MULTIVITAMIN ADULTS 50+) TABS Take 1 tablet by mouth daily.   Multiple Vitamins-Minerals (PRESERVISION AREDS PO) Take 2 tablets by mouth daily.    polyethylene glycol (MIRALAX / GLYCOLAX) 17 g packet Take 17 g by mouth daily as needed (and 1/2 up in the morning scheduled).   Rivaroxaban (XARELTO) 15 MG TABS tablet Take 1 tablet (15 mg total) by mouth daily with supper.   rosuvastatin (CRESTOR) 10 MG tablet Take 1 tablet (10 mg total) by mouth daily.   Sod Fluoride-Potassium Nitrate (PREVIDENT 5000 ENAMEL PROTECT) 1.1-5 % GEL Place 1  Application onto teeth at bedtime.   sodium chloride (OCEAN) 0.65 % nasal spray Place 2 sprays into the nose every 4 (four) hours as needed for congestion.   No facility-administered encounter medications on file as of 10/05/2022.    Review of Systems  Constitutional:  Negative for activity change, appetite change, chills, diaphoresis, fatigue, fever and unexpected weight change.  HENT:  Negative for congestion.   Respiratory:  Negative for cough, shortness of breath and wheezing.   Cardiovascular:  Negative for chest pain, palpitations and leg swelling.  Gastrointestinal:  Negative for abdominal distention, abdominal pain, constipation and diarrhea.       Fecal incontinence  Genitourinary:  Negative for difficulty urinating and dysuria.  Musculoskeletal:  Positive for gait problem. Negative for arthralgias, back pain, joint swelling and myalgias.       Left hip hematoma   Neurological:  Negative for dizziness, tremors, seizures, syncope, facial asymmetry, speech difficulty, weakness, light-headedness, numbness and headaches.  Psychiatric/Behavioral:  Negative for agitation, behavioral problems and confusion.  Memory loss    Immunization History  Administered Date(s) Administered   Fluad Quad(high Dose 65+) 05/19/2022   Influenza Whole 06/12/2012   Influenza, High Dose Seasonal PF 05/23/2019, 06/11/2020   Influenza,inj,Quad PF,6+ Mos 05/13/2013, 06/07/2018   Influenza-Unspecified 05/27/2014, 04/24/2015, 04/13/2016, 04/24/2017, 06/11/2020, 06/11/2021   Moderna Covid-19 Vaccine Bivalent Booster 50yr & up 06/21/2021   Moderna SARS-COV2 Booster Vaccination 04/18/2021   Moderna Sars-Covid-2 Vaccination 08/26/2019, 09/23/2019, 06/29/2020, 06/14/2022   Pneumococcal Conjugate-13 05/27/2014   Pneumococcal Polysaccharide-23 08/14/1997   Td 04/15/2011   Tdap 07/15/2015   Zoster Recombinat (Shingrix) 02/12/2017, 06/28/2017   Zoster, Live 08/15/2007   Pertinent  Health Maintenance Due   Topic Date Due   INFLUENZA VACCINE  Completed   DEXA SCAN  Completed      09/05/2021    2:14 PM 12/07/2021   10:00 AM 12/19/2021   11:07 AM 09/11/2022    3:54 PM 10/02/2022    9:39 AM  [place] the past year? 0 0 0 0 0  Was there an injury with Fall? 0 0 0 0 0  Fall Risk Category Calculator 0 0 0 0 0  Fall Risk Category (Retired) Low Low Low    (RETIRED) Patient Fall Risk Level Low fall risk Low fall risk Low fall risk    Patient at Risk for Falls Due to No Fall Risks No Fall Risks No Fall Risks    Fall risk Follow up Falls evaluation completed Falls evaluation completed Falls evaluation completed Falls evaluation completed Falls evaluation completed   Functional Status Survey:    Vitals:   10/05/22 0926  BP: 125/69  Pulse: 65  Resp: 16  Temp: 97.7 F (36.5 C)  SpO2: 95%  Weight: 156 lb 12.8 oz (71.1 kg)  Height: '5\' 4"'$  (1.626 m)   Body mass index is 26.91 kg/m. Physical Exam Vitals and nursing note reviewed.  Constitutional:      General: She is not in acute distress.    Appearance: She is not diaphoretic.  HENT:     Head: Normocephalic and atraumatic.  Neck:     Vascular: No JVD.  Cardiovascular:     Rate and Rhythm: Normal rate and regular rhythm.     Heart sounds: No murmur heard. Pulmonary:     Effort: Pulmonary effort is normal. No respiratory distress.     Breath sounds: Normal breath sounds. No wheezing.  Abdominal:     General: Bowel sounds are normal. There is no distension.     Palpations: Abdomen is soft. There is no mass.     Tenderness: There is no abdominal tenderness. There is no right CVA tenderness or left CVA tenderness.     Hernia: No hernia is present.  Genitourinary:    Comments: Atrophy to vulva Rectal area with no hemorrhoids. No swelling or tenderness. No prolapse. Slight reduced tone.  Musculoskeletal:     Right lower leg: No edema.     Left lower leg: No edema.     Comments: Left hip with large hematoma and dependent  bruising down the leg. No pain on range of motion. Bearing weight and no deformity  Skin:    General: Skin is warm and dry.  Neurological:     Mental Status: She is alert and oriented to person, place, and time.     Labs reviewed: Recent Labs    12/05/21 0000 06/10/22 0000  NA 142 142  K 4.0 3.8  CL 104 104  CO2 25* 22  BUN '19  19 18  '$ CREATININE 0.9 0.9  CALCIUM 8.7 8.8   Recent Labs    12/05/21 0000 06/10/22 0000  AST 22 32  ALT 16 20  ALKPHOS 64 79  ALBUMIN 3.9 4.2   Recent Labs    12/05/21 0000 05/11/22 0000 06/10/22 0000  WBC 4.3 5.1 4.5  HGB 11.7* 12.4 14.4  HCT 35* 37 42  PLT 249 246 232   Lab Results  Component Value Date   TSH 3.94 12/05/2021   No results found for: "HGBA1C" Lab Results  Component Value Date   CHOL 161 03/23/2022   HDL 76 (A) 03/23/2022   LDLCALC 75 03/23/2022   TRIG 52 03/23/2022    Significant Diagnostic Results in last 30 days:  No results found.  Assessment/Plan  1. Incontinence of feces with fecal urgency No prolapse or neurologic issue at this time. Just starting cholestyramine and she feels like it is starting to work Will give it more time and titrate as appropriate.   2. Hematoma Of left hip due to fall with loss of balance PT  Family/ staff Communication: resident   Labs/tests ordered:  NA  Total time 78mn:  time greater than 50% of total time spent doing pt counseling and coordination of care

## 2022-10-06 ENCOUNTER — Encounter: Payer: Self-pay | Admitting: Adult Health

## 2022-10-06 DIAGNOSIS — H43813 Vitreous degeneration, bilateral: Secondary | ICD-10-CM | POA: Diagnosis not present

## 2022-10-06 DIAGNOSIS — H353231 Exudative age-related macular degeneration, bilateral, with active choroidal neovascularization: Secondary | ICD-10-CM | POA: Diagnosis not present

## 2022-10-06 DIAGNOSIS — H43393 Other vitreous opacities, bilateral: Secondary | ICD-10-CM | POA: Diagnosis not present

## 2022-10-11 DIAGNOSIS — R2689 Other abnormalities of gait and mobility: Secondary | ICD-10-CM | POA: Diagnosis not present

## 2022-10-11 DIAGNOSIS — R2681 Unsteadiness on feet: Secondary | ICD-10-CM | POA: Diagnosis not present

## 2022-10-11 DIAGNOSIS — R296 Repeated falls: Secondary | ICD-10-CM | POA: Diagnosis not present

## 2022-10-13 DIAGNOSIS — R296 Repeated falls: Secondary | ICD-10-CM | POA: Diagnosis not present

## 2022-10-13 DIAGNOSIS — R2681 Unsteadiness on feet: Secondary | ICD-10-CM | POA: Diagnosis not present

## 2022-10-13 DIAGNOSIS — R2689 Other abnormalities of gait and mobility: Secondary | ICD-10-CM | POA: Diagnosis not present

## 2022-10-16 DIAGNOSIS — R296 Repeated falls: Secondary | ICD-10-CM | POA: Diagnosis not present

## 2022-10-16 DIAGNOSIS — L814 Other melanin hyperpigmentation: Secondary | ICD-10-CM | POA: Diagnosis not present

## 2022-10-16 DIAGNOSIS — D692 Other nonthrombocytopenic purpura: Secondary | ICD-10-CM | POA: Diagnosis not present

## 2022-10-16 DIAGNOSIS — L570 Actinic keratosis: Secondary | ICD-10-CM | POA: Diagnosis not present

## 2022-10-16 DIAGNOSIS — D1801 Hemangioma of skin and subcutaneous tissue: Secondary | ICD-10-CM | POA: Diagnosis not present

## 2022-10-16 DIAGNOSIS — R2681 Unsteadiness on feet: Secondary | ICD-10-CM | POA: Diagnosis not present

## 2022-10-16 DIAGNOSIS — C44619 Basal cell carcinoma of skin of left upper limb, including shoulder: Secondary | ICD-10-CM | POA: Diagnosis not present

## 2022-10-16 DIAGNOSIS — R2689 Other abnormalities of gait and mobility: Secondary | ICD-10-CM | POA: Diagnosis not present

## 2022-10-16 DIAGNOSIS — Z85828 Personal history of other malignant neoplasm of skin: Secondary | ICD-10-CM | POA: Diagnosis not present

## 2022-10-16 DIAGNOSIS — C44519 Basal cell carcinoma of skin of other part of trunk: Secondary | ICD-10-CM | POA: Diagnosis not present

## 2022-10-16 DIAGNOSIS — L821 Other seborrheic keratosis: Secondary | ICD-10-CM | POA: Diagnosis not present

## 2022-10-17 DIAGNOSIS — R2689 Other abnormalities of gait and mobility: Secondary | ICD-10-CM | POA: Diagnosis not present

## 2022-10-17 DIAGNOSIS — R2681 Unsteadiness on feet: Secondary | ICD-10-CM | POA: Diagnosis not present

## 2022-10-17 DIAGNOSIS — R296 Repeated falls: Secondary | ICD-10-CM | POA: Diagnosis not present

## 2022-10-17 DIAGNOSIS — I499 Cardiac arrhythmia, unspecified: Secondary | ICD-10-CM | POA: Diagnosis not present

## 2022-10-17 LAB — BASIC METABOLIC PANEL
BUN: 22 — AB (ref 4–21)
CO2: 22 (ref 13–22)
Chloride: 107 (ref 99–108)
Creatinine: 0.9 (ref 0.5–1.1)
Glucose: 84
Potassium: 4.4 mEq/L (ref 3.5–5.1)
Sodium: 141 (ref 137–147)

## 2022-10-17 LAB — TSH: TSH: 3.52 (ref 0.41–5.90)

## 2022-10-17 LAB — COMPREHENSIVE METABOLIC PANEL
Calcium: 8.8 (ref 8.7–10.7)
eGFR: 63

## 2022-10-19 DIAGNOSIS — R296 Repeated falls: Secondary | ICD-10-CM | POA: Diagnosis not present

## 2022-10-19 DIAGNOSIS — R2681 Unsteadiness on feet: Secondary | ICD-10-CM | POA: Diagnosis not present

## 2022-10-19 DIAGNOSIS — R2689 Other abnormalities of gait and mobility: Secondary | ICD-10-CM | POA: Diagnosis not present

## 2022-10-23 DIAGNOSIS — R2681 Unsteadiness on feet: Secondary | ICD-10-CM | POA: Diagnosis not present

## 2022-10-23 DIAGNOSIS — R2689 Other abnormalities of gait and mobility: Secondary | ICD-10-CM | POA: Diagnosis not present

## 2022-10-23 DIAGNOSIS — R296 Repeated falls: Secondary | ICD-10-CM | POA: Diagnosis not present

## 2022-10-25 DIAGNOSIS — R2681 Unsteadiness on feet: Secondary | ICD-10-CM | POA: Diagnosis not present

## 2022-10-25 DIAGNOSIS — R2689 Other abnormalities of gait and mobility: Secondary | ICD-10-CM | POA: Diagnosis not present

## 2022-10-25 DIAGNOSIS — R296 Repeated falls: Secondary | ICD-10-CM | POA: Diagnosis not present

## 2022-11-15 ENCOUNTER — Telehealth: Payer: Self-pay

## 2022-11-15 NOTE — Telephone Encounter (Signed)
Voicemail was left on clinical intake requesting a return call to [employer] [facility] [facility] claims company to confirm fax number in order to send functional cognitive screening form.   I returned call as requested and left the fax number for [facility], Office

## 2022-12-01 DIAGNOSIS — H43813 Vitreous degeneration, bilateral: Secondary | ICD-10-CM | POA: Diagnosis not present

## 2022-12-01 DIAGNOSIS — H353231 Exudative age-related macular degeneration, bilateral, with active choroidal neovascularization: Secondary | ICD-10-CM | POA: Diagnosis not present

## 2022-12-01 DIAGNOSIS — H04123 Dry eye syndrome of bilateral lacrimal glands: Secondary | ICD-10-CM | POA: Diagnosis not present

## 2022-12-01 DIAGNOSIS — H43393 Other vitreous opacities, bilateral: Secondary | ICD-10-CM | POA: Diagnosis not present

## 2022-12-19 ENCOUNTER — Non-Acute Institutional Stay: Payer: Medicare PPO | Admitting: Internal Medicine

## 2022-12-19 ENCOUNTER — Encounter: Payer: Self-pay | Admitting: Internal Medicine

## 2022-12-19 VITALS — BP 128/64 | HR 62 | Temp 98.3°F | Resp 16 | Ht 64.0 in | Wt 162.0 lb

## 2022-12-19 DIAGNOSIS — G3184 Mild cognitive impairment, so stated: Secondary | ICD-10-CM

## 2022-12-19 DIAGNOSIS — H353190 Nonexudative age-related macular degeneration, unspecified eye, stage unspecified: Secondary | ICD-10-CM | POA: Diagnosis not present

## 2022-12-19 DIAGNOSIS — E7800 Pure hypercholesterolemia, unspecified: Secondary | ICD-10-CM

## 2022-12-19 DIAGNOSIS — E785 Hyperlipidemia, unspecified: Secondary | ICD-10-CM | POA: Diagnosis not present

## 2022-12-19 DIAGNOSIS — R635 Abnormal weight gain: Secondary | ICD-10-CM | POA: Diagnosis not present

## 2022-12-19 DIAGNOSIS — J301 Allergic rhinitis due to pollen: Secondary | ICD-10-CM

## 2022-12-19 DIAGNOSIS — I4819 Other persistent atrial fibrillation: Secondary | ICD-10-CM

## 2022-12-19 DIAGNOSIS — E039 Hypothyroidism, unspecified: Secondary | ICD-10-CM | POA: Diagnosis not present

## 2022-12-19 DIAGNOSIS — I10 Essential (primary) hypertension: Secondary | ICD-10-CM

## 2022-12-19 NOTE — Progress Notes (Signed)
Location:  Medical illustrator of Service:  [facility] (13)  Provider:   Code Status: DNR Goals of Care:     12/19/2022    1:46 PM  Advanced Directives  Type of Advance Directive Out of facility DNR (pink MOST or yellow form);Healthcare Power of Becenti;Living will     Chief Complaint  Patient presents with   Medical Management of Chronic Issues    Patient is being seen for a follow up    HPI: Patient is a 89 y.o. female seen today for medical management of chronic diseases.    [place]ives in [place]   Patient has a history of PAF, HLD, chronic venous insufficiency, left carotid stenosis and hypertension  Allergic rhinitis Was seen by ENT and is now doing Flonase at night and also nasal sprays which is helping Hypertension Blood pressure was high and cardiology had increased started her on Cozaar  Most of the blood pressures seems in good control PAF doing well with amiodarone TSH in good limits  Patient has gained some weight because she is unable to swim anymore Otherwise she did not have any complaints Walks with her walker and is doing well with her ADLs Past Medical History:  Diagnosis Date   Actinic keratosis 09/23/2012   Anal fissure and fistula 07/28/2008   Arthralgia of temporomandibular joint 12/30/2009   Atrial fibrillation (HCC)    Atrial tachycardia    s/p ablation 2008. [facility] (R free wall Atach)   Atrophy of vulva 09/23/2012   Blepharochalasis 03/01/2011   Cardiac dysrhythmia, unspecified 11/10/2009   Dermatophytosis of nail 02/28/2008   Dizziness and giddiness 04/03/2007   Dysphagia, unspecified(787.20) 01/17/2012   Edema 11/21/2006   External hemorrhoids without mention of complication 07/28/2008   HTN (hypertension)    Hyperlipidemia    Insomnia, unspecified 11/18/2004   Internal hemorrhoids without mention of complication 02/28/2008   Left bundle branch hemiblock    Loss of weight 03/05/2012   Myalgia and myositis, unspecified 01/05/2009    Orthostatic hypotension 03/22/2007   Other malaise and fatigue 03/05/2012   Pain in joint, site unspecified 02/28/2008   Palpitations 11/21/2006   Personality change due to conditions classified elsewhere    Pterygium eye, right 06/07/2020   Rectocele 09/23/2012   Restless legs syndrome (RLS)    Rosacea 09/23/2012   Sebaceous cyst 03/01/2011   Thumb pain 11/20/2012   Unspecified hereditary and idiopathic peripheral neuropathy     Past Surgical History:  Procedure Laterality Date   BLADDER SUSPENSION  1998   CATARACT EXTRACTION W/ INTRAOCULAR LENS IMPLANT Right 4/8//2008   Dr. Emmit Pomfret   catheter ablation  2008   for atrial tachycardia   CYST EXCISION  1998   mucus cyst removal from finger   PTERYGIUM EXCISION  06/07/2020   TOTAL ABDOMINAL HYSTERECTOMY  2001   uterine bleeding    Allergies  Allergen Reactions   Erythromycin Nausea And Vomiting   Flecainide Acetate     Postural hypotension   Penicillins     Outpatient Encounter Medications as of 12/19/2022  Medication Sig   acetaminophen (TYLENOL) 500 MG tablet Take 1,000 mg by mouth every 8 (eight) hours as needed.   amiodarone (PACERONE) 200 MG tablet Take 0.5 tablets (100 mg total) by mouth daily.   amLODipine (NORVASC) 5 MG tablet Take 5 mg by mouth daily.   Artificial Tear Solution (GENTEAL TEARS OP) Apply 1 drop to eye 2 (two) times daily.   cholecalciferol (VITAMIN D3) 25 MCG (1000  UNIT) tablet Take 1,000 Units by mouth daily.   dextromethorphan-guaiFENesin (ROBAFEN DM COUGH) 10-100 MG/5ML liquid Take 10 mLs by mouth every 6 (six) hours as needed for cough.   diclofenac Sodium (VOLTAREN) 1 % GEL Apply topically 2 (two) times daily. May self apply to left knee/hip prn discomfort   diltiazem (CARDIZEM) 60 MG tablet Take 1 tablet (60 mg total) by mouth 3 (three) times daily as needed (For heart rate >110 bpm).   fluticasone (FLONASE) 50 MCG/ACT nasal spray Place 1 spray into both nostrils daily.   furosemide (LASIX) 20 MG tablet TAKE  1 TABLET ONCE DAILY AS NEEDED FOR SWELLING- TAKE 1/2 TABLET FOR LEG SWELLING AS NEEDED.   hydrALAZINE (APRESOLINE) 10 MG tablet Take 10 mg by mouth 3 (three) times daily as needed.   hydrALAZINE (APRESOLINE) 25 MG tablet Take 25 mg by mouth 2 (two) times daily.   levalbuterol (XOPENEX) 0.63 MG/3ML nebulizer solution Take 0.63 mg by nebulization 2 (two) times daily as needed for wheezing or shortness of breath.   loratadine (CLARITIN) 10 MG tablet Take 1 tablet (10 mg total) by mouth daily.   losartan (COZAAR) 25 MG tablet Take 1 tablet (25 mg total) by mouth every evening.   Multiple Vitamins-Minerals (MULTIVITAMIN ADULTS 50+) TABS Take 1 tablet by mouth daily.   Multiple Vitamins-Minerals (PRESERVISION AREDS PO) Take 2 tablets by mouth daily.    polyethylene glycol (MIRALAX / GLYCOLAX) 17 g packet Take 17 g by mouth daily as needed (and 1/2 up in the morning scheduled).   Rivaroxaban (XARELTO) 15 MG TABS tablet Take 1 tablet (15 mg total) by mouth daily with supper.   rosuvastatin (CRESTOR) 10 MG tablet Take 1 tablet (10 mg total) by mouth daily.   Sod Fluoride-Potassium Nitrate (PREVIDENT 5000 ENAMEL PROTECT) 1.1-5 % GEL Place 1 Application onto teeth at bedtime.   sodium chloride (OCEAN) 0.65 % nasal spray Place 2 sprays into the nose every 4 (four) hours as needed for congestion.   No facility-administered encounter medications on file as of 12/19/2022.    Review of Systems:  Review of Systems  Constitutional:  Positive for activity change. Negative for appetite change.  HENT: Negative.    Eyes:  Positive for visual disturbance.  Respiratory:  Negative for cough and shortness of breath.   Cardiovascular:  Positive for leg swelling.  Gastrointestinal:  Negative for constipation.  Genitourinary: Negative.   Musculoskeletal:  Positive for gait problem. Negative for arthralgias and myalgias.  Skin: Negative.   Neurological:  Negative for dizziness and weakness.  Psychiatric/Behavioral:   Negative for confusion, dysphoric mood and sleep disturbance.     Health Maintenance  Topic Date Due   Medicare Annual Wellness (AWV)  01/02/2023 (Originally 12/20/2022)   COVID-19 Vaccine (6 - 2023-24 season) 02/21/2023 (Originally 08/09/2022)   INFLUENZA VACCINE  03/15/2023   DTaP/Tdap/Td (3 - Td or Tdap) 07/14/2025   Pneumonia Vaccine 52+ Years old  Completed   DEXA SCAN  Completed   Zoster Vaccines- Shingrix  Completed   HPV VACCINES  Aged Out    Physical Exam: Vitals:   12/19/22 1345  BP: 128/64  Pulse: 62  Resp: 16  Temp: 98.3 F (36.8 C)  TempSrc: Temporal  SpO2: 93%  Weight: 162 lb (73.5 kg)  Height: 5\' 4"  (1.626 m)   Body mass index is 27.81 kg/m. Physical Exam Vitals reviewed.  Constitutional:      Appearance: Normal appearance.  HENT:     Head: Normocephalic.  Right Ear: Tympanic membrane normal.     Left Ear: Tympanic membrane normal.     Ears:     Comments: Right side had some wax    Nose: Nose normal.     Mouth/Throat:     Mouth: Mucous membranes are moist.     Pharynx: Oropharynx is clear.  Eyes:     Pupils: Pupils are equal, round, and reactive to light.  Cardiovascular:     Rate and Rhythm: Normal rate and regular rhythm.     Pulses: Normal pulses.     Heart sounds: Normal heart sounds. No murmur heard. Pulmonary:     Effort: Pulmonary effort is normal.     Breath sounds: Normal breath sounds.  Abdominal:     General: Abdomen is flat. Bowel sounds are normal.     Palpations: Abdomen is soft.  Musculoskeletal:        General: Swelling present.     Cervical back: Neck supple.  Skin:    General: Skin is warm.  Neurological:     General: No focal deficit present.     Mental Status: She is alert and oriented to person, place, and time.  Psychiatric:        Mood and Affect: Mood normal.        Thought Content: Thought content normal.    Labs reviewed: Basic Metabolic Panel: Recent Labs    06/10/22 0000  NA 142  K 3.8  CL 104   CO2 22  BUN 18  CREATININE 0.9  CALCIUM 8.8   Liver Function Tests: Recent Labs    06/10/22 0000  AST 32  ALT 20  ALKPHOS 79  ALBUMIN 4.2   No results for input(s): "LIPASE", "AMYLASE" in the last 8760 hours. No results for input(s): "AMMONIA" in the last 8760 hours. CBC: Recent Labs    05/11/22 0000 06/10/22 0000  WBC 5.1 4.5  HGB 12.4 14.4  HCT 37 42  PLT 246 232   Lipid Panel: Recent Labs    03/23/22 1505  CHOL 161  HDL 76*  LDLCALC 75  TRIG 52   No results found for: "HGBA1C"  Procedures since last visit: No results found.  Assessment/Plan 1. Essential hypertension Cozaar was added By Cardiology Most of the BP readings are good but some still have SBP more then 150 Will Continue Same dose of Cozaar Amlodipine and Hydralazine  2. Persistent atrial fibrillation (HCC) On Xarelto and Amiodarone Continues to do well  3. Mild cognitive impairment Doing well in [place]  4. Pure hypercholesterolemia On Crestor Last LDL 75  5. Seasonal allergic rhinitis due to pollen Flonase and Claritin  6. Weight gain Continue Exercise of walking  7. Nonexudative age-related macular degeneration, unspecified laterality, unspecified stage Follows with Opthalmology 8 Constipation issues Miralax  Labs Done are Stable  Labs/tests ordered:  * No order type specified * Next appt:  12/25/2022

## 2022-12-25 ENCOUNTER — Encounter: Payer: Self-pay | Admitting: Adult Health

## 2022-12-25 ENCOUNTER — Ambulatory Visit (INDEPENDENT_AMBULATORY_CARE_PROVIDER_SITE_OTHER): Payer: Medicare PPO | Admitting: Adult Health

## 2022-12-25 VITALS — BP 136/80 | HR 69 | Temp 97.6°F | Resp 16 | Ht 64.0 in | Wt 162.0 lb

## 2022-12-25 DIAGNOSIS — Z0000 Encounter for general adult medical examination without abnormal findings: Secondary | ICD-10-CM

## 2022-12-25 MED ORDER — IPRATROPIUM BROMIDE 0.03 % NA SOLN: 2.0000 | Freq: Two times a day (BID) | NASAL | 12 refills | Status: DC

## 2022-12-25 NOTE — Patient Instructions (Signed)
[name] , Thank you for taking time to come for your Medicare Wellness Visit. I appreciate your ongoing commitment to your health goals. Please review the following plan we discussed and let me know if I can assist you in the future.   Screening recommendations/referrals: Colonoscopy aged out Mammogram aged out Bone Density ordered  Recommended yearly ophthalmology/optometry visit for glaucoma screening and checkup Recommended yearly dental visit for hygiene and checkup  Vaccinations: Influenza vaccine- due annually in September/October Pneumococcal vaccine Prevnar 20 recommended  Tdap vaccine up to date Shingles vaccine up to date     Advanced directives: reviewed   Conditions/risks identified: fall risk   Next appointment: 1 year   Preventive Care 87 Years and Older, Female Preventive care refers to lifestyle choices and visits with your health care provider that can promote health and wellness. What does preventive care include? A yearly physical exam. This is also called an annual well check. Dental exams once or twice a year. Routine eye exams. Ask your health care provider how often you should have your eyes checked. Personal lifestyle choices, including: Daily care of your teeth and gums. Regular physical activity. Eating a healthy diet. Avoiding tobacco and drug use. Limiting alcohol use. Practicing safe sex. Taking low-dose aspirin every day. Taking vitamin and mineral supplements as recommended by your health care provider. What happens during an annual well check? The services and screenings done by your health care provider during your annual well check will depend on your age, overall health, lifestyle risk factors, and family history of disease. Counseling  Your health care provider may ask you questions about your: Alcohol use. Tobacco use. Drug use. Emotional well-being. Home and relationship well-being. Sexual activity. Eating habits. History of  falls. Memory and ability to understand (cognition). Work and work Astronomer. Reproductive health. Screening  You may have the following tests or measurements: Height, weight, and BMI. Blood pressure. Lipid and cholesterol levels. These may be checked every 5 years, or more frequently if you are over 26 years old. Skin check. Lung cancer screening. You may have this screening every year starting at age 87 if you have a 30-pack-year history of smoking and currently smoke or have quit within the past 15 years. Fecal occult blood test (FOBT) of the stool. You may have this test every year starting at age 87. Flexible sigmoidoscopy or colonoscopy. You may have a sigmoidoscopy every 5 years or a colonoscopy every 10 years starting at age 87. Hepatitis C blood test. Hepatitis B blood test. Sexually transmitted disease (STD) testing. Diabetes screening. This is done by checking your blood sugar (glucose) after you have not eaten for a while (fasting). You may have this done every 1-3 years. Bone density scan. This is done to screen for osteoporosis. You may have this done starting at age 87. Mammogram. This may be done every 1-2 years. Talk to your health care provider about how often you should have regular mammograms. Talk with your health care provider about your test results, treatment options, and if necessary, the need for more tests. Vaccines  Your health care provider may recommend certain vaccines, such as: Influenza vaccine. This is recommended every year. Tetanus, diphtheria, and acellular pertussis (Tdap, Td) vaccine. You may need a Td booster every 10 years. Zoster vaccine. You may need this after age 58. Pneumococcal 13-valent conjugate (PCV13) vaccine. One dose is recommended after age 62. Pneumococcal polysaccharide (PPSV23) vaccine. One dose is recommended after age 71. Talk to your health care  provider about which screenings and vaccines you need and how often you need  them. This information is not intended to replace advice given to you by your health care provider. Make sure you discuss any questions you have with your health care provider. Document Released: 08/27/2015 Document Revised: 04/19/2016 Document Reviewed: 06/01/2015 Elsevier Interactive Patient Education  2017 [employer] Prevention in the Home Falls can cause injuries. They can happen to people of all ages. There are many things you can do to make your home safe and to help prevent falls. What can I do on the outside of my home? Regularly fix the edges of walkways and driveways and fix any cracks. Remove anything that might make you trip as you walk through a door, such as a raised step or threshold. Trim any bushes or trees on the path to your home. Use bright outdoor lighting. Clear any walking paths of anything that might make someone trip, such as rocks or tools. Regularly check to see if handrails are loose or broken. Make sure that both sides of any steps have handrails. Any raised decks and porches should have guardrails on the edges. Have any leaves, snow, or ice cleared regularly. Use sand or salt on walking paths during winter. Clean up any spills in your garage right away. This includes oil or grease spills. What can I do in the bathroom? Use night lights. Install grab bars by the toilet and in the tub and shower. Do not use towel bars as grab bars. Use non-skid mats or decals in the tub or shower. If you need to sit down in the shower, use a plastic, non-slip stool. Keep the floor dry. Clean up any water that spills on the floor as soon as it happens. Remove soap buildup in the tub or shower regularly. Attach bath mats securely with double-sided non-slip rug tape. Do not have throw rugs and other things on the floor that can make you trip. What can I do in the bedroom? Use night lights. Make sure that you have a light by your bed that is easy to reach. Do not use  any sheets or blankets that are too big for your bed. They should not hang down onto the floor. Have a firm chair that has side arms. You can use this for support while you get dressed. Do not have throw rugs and other things on the floor that can make you trip. What can I do in the kitchen? Clean up any spills right away. Avoid walking on wet floors. Keep items that you use a lot in easy-to-reach places. If you need to reach something above you, use a strong step stool that has a grab bar. Keep electrical cords out of the way. Do not use floor polish or wax that makes floors slippery. If you must use wax, use non-skid floor wax. Do not have throw rugs and other things on the floor that can make you trip. What can I do with my stairs? Do not leave any items on the stairs. Make sure that there are handrails on both sides of the stairs and use them. Fix handrails that are broken or loose. Make sure that handrails are as long as the stairways. Check any carpeting to make sure that it is firmly attached to the stairs. Fix any carpet that is loose or worn. Avoid having throw rugs at the top or bottom of the stairs. If you do have throw rugs, attach them to the  floor with carpet tape. Make sure that you have a light switch at the top of the stairs and the bottom of the stairs. If you do not have them, ask someone to add them for you. What else can I do to help prevent falls? Wear shoes that: Do not have high heels. Have rubber bottoms. Are comfortable and fit you well. Are closed at the toe. Do not wear sandals. If you use a stepladder: Make sure that it is fully opened. Do not climb a closed stepladder. Make sure that both sides of the stepladder are locked into place. Ask someone to hold it for you, if possible. Clearly mark and make sure that you can see: Any grab bars or handrails. First and last steps. Where the edge of each step is. Use tools that help you move around (mobility aids)  if they are needed. These include: Canes. Walkers. Scooters. Crutches. Turn on the lights when you go into a dark area. Replace any light bulbs as soon as they burn out. Set up your furniture so you have a clear path. Avoid moving your furniture around. If any of your floors are uneven, fix them. If there are any pets around you, be aware of where they are. Review your medicines with your doctor. Some medicines can make you feel dizzy. This can increase your chance of falling. Ask your doctor what other things that you can do to help prevent falls. This information is not intended to replace advice given to you by your health care provider. Make sure you discuss any questions you have with your health care provider. Document Released: 05/27/2009 Document Revised: 01/06/2016 Document Reviewed: 09/04/2014 Elsevier Interactive Patient Education  2017 [employer].

## 2022-12-25 NOTE — Progress Notes (Signed)
Subjective:   Rebekah Graham is a 89 y.o. female who presents for Medicare Annual (Subsequent) preventive examination at [facility]   Review of Systems     Cardiac Risk Factors include: hypertension;advanced age (>76men, >55 women)     Objective:    Today's Vitals   12/25/22 1301 12/25/22 1308  BP: 136/80   Pulse: 69   Resp: 16   Temp: 97.6 F (36.4 C)   TempSrc: Temporal   SpO2: 95%   Weight: 162 lb (73.5 kg)   Height: 5\' 4"  (1.626 m)   PainSc:  0-No pain   Body mass index is 27.81 kg/m.     12/25/2022    1:06 PM 12/19/2022    1:46 PM 10/02/2022    9:39 AM 09/11/2022    3:54 PM 05/10/2022    8:33 AM 03/21/2022   10:56 AM 12/19/2021    9:28 AM  Advanced Directives  Does Patient Have a Medical Advance Directive? Yes  Yes Yes Yes Yes Yes  Type of Advance Directive Out of facility DNR (pink MOST or yellow form);Healthcare Power of [place];Living will Out of facility DNR (pink MOST or yellow form);Healthcare Power of [place];Living will Healthcare Power of [place];Living will;Out of facility DNR (pink MOST or yellow form) Healthcare Power of [place];Living will;Out of facility DNR (pink MOST or yellow form) Healthcare Power of [place];Out of facility DNR (pink MOST or yellow form);Living will Healthcare Power of [place];Living will;Out of facility DNR (pink MOST or yellow form) Healthcare Power of [place];Living will;Out of facility DNR (pink MOST or yellow form)  Does patient want to make changes to medical advance directive? No - Patient declined  No - Patient declined No - Patient declined No - Patient declined No - Patient declined No - Patient declined  Copy of Healthcare Power of Attorney in Chart?   Yes - validated most recent copy scanned in chart (See row information) Yes - validated most recent copy scanned in chart (See row information) Yes - validated most recent copy scanned in chart (See row information) Yes - validated most  recent copy scanned in chart (See row information) Yes - validated most recent copy scanned in chart (See row information)  Pre-existing out of facility DNR order (yellow form or pink MOST form)      Yellow form placed in chart (order not valid for inpatient use) Yellow form placed in chart (order not valid for inpatient use)    Current Medications (verified) Outpatient Encounter Medications as of 12/25/2022  Medication Sig   acetaminophen (TYLENOL) 500 MG tablet Take 1,000 mg by mouth every 8 (eight) hours as needed.   amiodarone (PACERONE) 200 MG tablet Take 0.5 tablets (100 mg total) by mouth daily.   amLODipine (NORVASC) 5 MG tablet Take 5 mg by mouth daily.   Artificial Tear Solution (GENTEAL TEARS OP) Apply 1 drop to eye 2 (two) times daily.   cholecalciferol (VITAMIN D3) 25 MCG (1000 UNIT) tablet Take 1,000 Units by mouth daily.   dextromethorphan-guaiFENesin (ROBAFEN DM COUGH) 10-100 MG/5ML liquid Take 10 mLs by mouth every 6 (six) hours as needed for cough.   diclofenac Sodium (VOLTAREN) 1 % GEL Apply topically 2 (two) times daily. May self apply to left knee/hip prn discomfort   diltiazem (CARDIZEM) 60 MG tablet Take 1 tablet (60 mg total) by mouth 3 (three) times daily as needed (For heart rate >110 bpm).   fluticasone (FLONASE) 50 MCG/ACT nasal spray Place 1 spray into both nostrils daily.  furosemide (LASIX) 20 MG tablet TAKE 1 TABLET ONCE DAILY AS NEEDED FOR SWELLING- TAKE 1/2 TABLET FOR LEG SWELLING AS NEEDED.   hydrALAZINE (APRESOLINE) 10 MG tablet Take 10 mg by mouth 3 (three) times daily as needed.   hydrALAZINE (APRESOLINE) 25 MG tablet Take 25 mg by mouth 2 (two) times daily.   levalbuterol (XOPENEX) 0.63 MG/3ML nebulizer solution Take 0.63 mg by nebulization 2 (two) times daily as needed for wheezing or shortness of breath.   loratadine (CLARITIN) 10 MG tablet Take 1 tablet (10 mg total) by mouth daily.   losartan (COZAAR) 25 MG tablet Take 1 tablet (25 mg total) by mouth  every evening.   Multiple Vitamins-Minerals (MULTIVITAMIN ADULTS 50+) TABS Take 1 tablet by mouth daily.   Multiple Vitamins-Minerals (PRESERVISION AREDS PO) Take 2 tablets by mouth daily.    polyethylene glycol (MIRALAX / GLYCOLAX) 17 g packet Take 17 g by mouth daily as needed (and 1/2 up in the morning scheduled).   Rivaroxaban (XARELTO) 15 MG TABS tablet Take 1 tablet (15 mg total) by mouth daily with supper.   rosuvastatin (CRESTOR) 10 MG tablet Take 1 tablet (10 mg total) by mouth daily.   Sod Fluoride-Potassium Nitrate (PREVIDENT 5000 ENAMEL PROTECT) 1.1-5 % GEL Place 1 Application onto teeth at bedtime.   sodium chloride (OCEAN) 0.65 % nasal spray Place 2 sprays into the nose every 4 (four) hours as needed for congestion.   No facility-administered encounter medications on file as of 12/25/2022.    Allergies (verified) Erythromycin, Flecainide acetate, and Penicillins   History: Past Medical History:  Diagnosis Date   Actinic keratosis 09/23/2012   Anal fissure and fistula 07/28/2008   Arthralgia of temporomandibular joint 12/30/2009   Atrial fibrillation (HCC)    Atrial tachycardia    s/p ablation 2008. [facility] (R free wall Atach)   Atrophy of vulva 09/23/2012   Blepharochalasis 03/01/2011   Cardiac dysrhythmia, unspecified 11/10/2009   Dermatophytosis of nail 02/28/2008   Dizziness and giddiness 04/03/2007   Dysphagia, unspecified(787.20) 01/17/2012   Edema 11/21/2006   External hemorrhoids without mention of complication 07/28/2008   HTN (hypertension)    Hyperlipidemia    Insomnia, unspecified 11/18/2004   Internal hemorrhoids without mention of complication 02/28/2008   Left bundle branch hemiblock    Loss of weight 03/05/2012   Myalgia and myositis, unspecified 01/05/2009   Orthostatic hypotension 03/22/2007   Other malaise and fatigue 03/05/2012   Pain in joint, site unspecified 02/28/2008   Palpitations 11/21/2006   Personality change due to conditions classified elsewhere     Pterygium eye, right 06/07/2020   Rectocele 09/23/2012   Restless legs syndrome (RLS)    Rosacea 09/23/2012   Sebaceous cyst 03/01/2011   Thumb pain 11/20/2012   Unspecified hereditary and idiopathic peripheral neuropathy    Past Surgical History:  Procedure Laterality Date   BLADDER SUSPENSION  1998   CATARACT EXTRACTION W/ INTRAOCULAR LENS IMPLANT Right 4/8//2008   Dr. 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[employer] [place]  Tobacco Use   Smoking status: Never   Smokeless tobacco: Never   Tobacco comments:    tobacco  - none  Vaping Use   Vaping Use: Never used  Substance and Sexual Activity   Alcohol use: Yes    Comment: 1-2 glasses of wine/gin daily    Drug use: No   Sexual activity: Never  Other Topics Concern   Not on file  Social History Narrative   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, DNR   Married -husband moved to skill unit 07/2014   Water aerobic, ACES for exercise, walking   Wine 1 glass nightly   Never smoked   Social Determinants of Health   Financial Resource Strain: Low Risk  (10/02/2017)   Overall Financial Resource Strain (CARDIA)    Difficulty of Paying Living Expenses: Not hard at all  Food Insecurity: No Food Insecurity (10/02/2017)   Hunger Vital Sign    Worried About Running Out of Food in the Last Year: Never true    Ran Out of Food in the Last Year: Never true  Transportation  Needs: No Transportation Needs (10/02/2017)   PRAPARE - Administrator, Civil Service (Medical): No    Lack of Transportation (Non-Medical): No  Physical Activity: Sufficiently Active (10/02/2017)   Exercise Vital Sign    Days of Exercise per Week: 7 days    Minutes of Exercise per Session: 60 min  Stress: No Stress Concern Present (10/02/2017)   [employer] of Occupational Health - Occupational Stress Questionnaire    Feeling of Stress : Only a little  Social Connections: Moderately Integrated (10/02/2017)   Social Connection and Isolation Panel [NHANES]    Frequency of Communication with Friends and Family: More than three times a week    Frequency of Social Gatherings with Friends and Family: More than three times a week    Attends Religious Services: More than 4 times per year    Active Member of [employer] or Organizations: Yes    Attends Banker Meetings: 1 to 4 times per year    Marital Status: Widowed    Tobacco Counseling Counseling given: Not Answered Tobacco comments: tobacco  - none   Clinical Intake:  Pre-visit preparation completed: Yes  Pain : No/denies pain Pain Score: 0-No pain     BMI - recorded: 27.81 Nutritional Status: BMI 25 -29 Overweight Nutritional Risks: None Diabetes: No CBG done?: No Did pt. bring in CBG monitor from home?: No  How often do you need to have someone help you when you read instructions, pamphlets, or other written materials from your doctor or pharmacy?: 1 - Never  Diabetic?no  Interpreter Needed?: No      Activities of Daily Living    12/25/2022    1:32 PM  In your present state of health, do you have any difficulty performing the following activities:  Hearing? 0  Vision? 1  Difficulty concentrating or making decisions? 1  Walking or climbing stairs? 1  Dressing or bathing? 0  Doing errands, shopping? 1  Preparing Food and eating ? Y  Using the Toilet? N  In the past six months, have you  accidently leaked urine? Y  Do you have problems  with loss of bowel control? N  Managing your Medications? Y  Managing your Finances? Y  Housekeeping or managing your Housekeeping? Y    Patient Care Team: Mahlon Gammon, MD as PCP - General (Internal Medicine) [facility], Fredrik Cove, NP as Nurse Practitioner (Geriatric Medicine) Yates Decamp, MD as Consulting Physician (Cardiology) Teresa Coombs, MD as Consulting Physician (Ophthalmology)  Indicate any recent Medical Services you may have received from other than [facility] providers in the past year (date may be approximate).     Assessment:   This is a routine wellness examination for [place].  Hearing/Vision screen Hearing Screening - Comments:: Patient states she would like to have a hearing test  Vision Screening - Comments:: Patient states she has had a vision test in the last 12 months  Dietary issues and exercise activities discussed: Current Exercise Habits: Structured exercise class, Type of exercise: strength training/weights, Time (Minutes): 30, Frequency (Times/Week): 6, Weekly Exercise (Minutes/Week): 180, Intensity: Mild, Exercise limited by: orthopedic condition(s)   Goals Addressed   None    Depression Screen    12/25/2022    1:05 PM 12/19/2022    1:46 PM 09/11/2022    3:54 PM 12/19/2021   11:07 AM 06/10/2020    9:58 AM 05/05/2020    9:08 AM 11/26/2019   10:41 AM  PHQ 2/9 Scores  PHQ - 2 Score 0 0 0 0 0 0 0    Fall Risk    12/25/2022    1:05 PM 12/19/2022    1:46 PM 10/02/2022    9:39 AM 09/11/2022    3:54 PM 12/19/2021   11:07 AM  Fall Risk   Falls in the past year? 0 0 0 0 0  Number falls in past yr: 0 0 0 0 0  Injury with Fall? 0 0 0 0 0  Risk for fall due to : No Fall Risks No Fall Risks   No Fall Risks  Follow up Falls evaluation completed Falls evaluation completed Falls evaluation completed Falls evaluation completed Falls evaluation completed    FALL RISK PREVENTION  PERTAINING TO THE HOME:  Any stairs in or around the home? No  If so, are there any without handrails? No  Home free of loose throw rugs in walkways, pet beds, electrical cords, etc? Yes  Adequate lighting in your home to reduce risk of falls? Yes   ASSISTIVE DEVICES UTILIZED TO PREVENT FALLS:  Life alert? No  Use of a cane, walker or w/c? Yes  Grab bars in the bathroom? Yes  Shower chair or bench in shower? Yes  Elevated toilet seat or a handicapped toilet? No   TIMED UP AND GO:  Was the test performed? Yes .  Length of time to ambulate 10 feet: 13 sec.   Gait slow and steady with assistive device  Cognitive Function:    12/25/2022    1:07 PM 10/02/2017    9:32 AM 10/04/2016   10:23 AM 10/06/2015   10:24 AM 10/06/2014    3:04 PM  MMSE - Mini Mental State Exam  Orientation to time 4 5 5 4 5   Orientation to Place 5 5 5 5 5   Registration 3 3 3 3 3   Attention/ Calculation 5 5 5 5 5   Recall 3 3 3 3 3   Language- name 2 objects 2 2 2 2 2   Language- repeat 1 1 1 1 1   Language- follow 3 step command 3 3 3 3 3   Language- read &  follow direction 1 1 1 1 1   Write a sentence 1 1 1 1 1   Copy design 1 1 1 1 1   Total score 29 30 30 29 30         12/19/2021   11:09 AM 06/10/2020   10:00 AM 06/10/2019   10:40 AM  6CIT Screen  What Year? 0 points 0 points 0 points  What month? 0 points 0 points 0 points  What time? 0 points 0 points 0 points  Count back from 20 0 points 0 points 0 points  Months in reverse 0 points 0 points 0 points  Repeat phrase 6 points 0 points 0 points  Total Score 6 points 0 points 0 points    Immunizations Immunization History  Administered Date(s) Administered   Fluad Quad(high Dose 65+) 05/19/2022   Influenza Whole 06/12/2012   Influenza, High Dose Seasonal PF 05/23/2019, 06/11/2020   Influenza,inj,Quad PF,6+ Mos 05/13/2013, 06/07/2018   Influenza-Unspecified 05/27/2014, 04/24/2015, 04/13/2016, 04/24/2017, 06/11/2020, 06/11/2021   Moderna Covid-19  Vaccine Bivalent Booster 65yrs & up 06/21/2021   Moderna SARS-COV2 Booster Vaccination 04/18/2021   Moderna Sars-Covid-2 Vaccination 08/26/2019, 09/23/2019, 06/29/2020, 06/14/2022   Pneumococcal Conjugate-13 05/27/2014   Pneumococcal Polysaccharide-23 08/14/1997   RSV,unspecified 08/09/2022   Td 04/15/2011   Tdap 07/15/2015   Zoster Recombinat (Shingrix) 02/12/2017, 06/28/2017   Zoster, Live 08/15/2007    TDAP status: Up to date  Flu Vaccine status: Up to date  Pneumococcal vaccine status: Due, Education has been provided regarding the importance of this vaccine. Advised may receive this vaccine at local pharmacy or Health Dept. Aware to provide a copy of the vaccination record if obtained from local pharmacy or Health Dept. Verbalized acceptance and understanding.  Covid-19 vaccine status: Completed vaccines  Qualifies for Shingles Vaccine? Yes   Zostavax completed Yes   Shingrix Completed?: Yes  Screening Tests Health Maintenance  Topic Date Due   COVID-19 Vaccine (6 - 2023-24 season) 02/21/2023 (Originally 08/09/2022)   INFLUENZA VACCINE  03/15/2023   Medicare Annual Wellness (AWV)  12/25/2023   DTaP/Tdap/Td (3 - Td or Tdap) 07/14/2025   Pneumonia Vaccine 50+ Years old  Completed   DEXA SCAN  Completed   Zoster Vaccines- Shingrix  Completed   HPV VACCINES  Aged Out    Health Maintenance  There are no preventive care reminders to display for this patient.  Colorectal cancer screening: No longer required.   Mammogram status: No longer required due to age.  Bone Density status: Ordered now. Pt provided with contact info and advised to call to schedule appt.  Lung Cancer Screening: (Low Dose CT Chest recommended if Age 70-80 years, 30 pack-year currently smoking OR have quit w/in 15years.) does not qualify.   Lung Cancer Screening Referral: na  Additional Screening:  Hepatitis C Screening: does not qualify; Completed na  Vision Screening: Recommended annual  ophthalmology exams for early detection of glaucoma and other disorders of the eye. Is the patient up to date with their annual eye exam?  Yes  Who is the provider or what is the name of the office in which the patient attends annual eye exams? [facility]  If pt is not established with a provider, would they like to be referred to a provider to establish care? Yes .   Dental Screening: Recommended annual dental exams for proper oral hygiene  Community Resource Referral / Chronic Care Management: CRR required this visit?  No   CCM required this visit?  No  Plan:     I have personally reviewed and noted the following in the patient's chart:   Medical and social history Use of alcohol, tobacco or illicit drugs  Current medications and supplements including opioid prescriptions. Patient is not currently taking opioid prescriptions. Functional ability and status Nutritional status Physical activity Advanced directives List of other physicians Hospitalizations, surgeries, and ER visits in previous 12 months Vitals Screenings to include cognitive, depression, and falls Referrals and appointments  In addition, I have reviewed and discussed with patient certain preventive protocols, quality metrics, and best practice recommendations. A written personalized care plan for preventive services as well as general preventive health recommendations were provided to patient.     Fletcher Anon, NP   12/25/2022   Nurse Notes: she would like to try another nasal spray for drippy nose. Worse at night. Will d/c flonase and try atrovent

## 2023-01-01 DIAGNOSIS — M810 Age-related osteoporosis without current pathological fracture: Secondary | ICD-10-CM | POA: Diagnosis not present

## 2023-01-01 LAB — HM DEXA SCAN

## 2023-01-12 ENCOUNTER — Encounter: Payer: Self-pay | Admitting: Adult Health

## 2023-01-12 ENCOUNTER — Non-Acute Institutional Stay: Payer: Medicare PPO | Admitting: Adult Health

## 2023-01-12 DIAGNOSIS — M810 Age-related osteoporosis without current pathological fracture: Secondary | ICD-10-CM | POA: Diagnosis not present

## 2023-01-12 NOTE — Progress Notes (Unsigned)
Location:  [facility] Room Number: 523A Place of Service:  ALF [phone]) Provider:  Tamsen Roers, MD  Patient Care Team: Mahlon Gammon, MD as PCP - General (Internal Medicine) Community, [facility], Fredrik Cove, NP as Nurse Practitioner (Geriatric Medicine) Yates Decamp, MD as Consulting Physician (Cardiology) Teresa Coombs, MD as Consulting Physician (Ophthalmology)  Extended Emergency Contact Information Primary Emergency Contact: Dot Lanes of [place] Home Phone: [phone] Work Phone: [phone] Relation: Son Secondary Emergency Contact: [facility] Mobile Phone: [phone] Relation: Friend  Code Status:  DNR Goals of care: Advanced Directive information    01/12/2023    8:53 AM  Advanced Directives  Does Patient Have a Medical Advance Directive? Yes  Type of Advance Directive Out of facility DNR (pink MOST or yellow form);Healthcare Power of [place];Living will  Does patient want to make changes to medical advance directive? No - Patient declined     Chief Complaint  Patient presents with   Acute Visit    Patient is being seen for osteoporosis     HPI:  Pt is a 89 y.o. female seen today for an acute visit for osteoporosis. Rebekah Graham is ambulatory with a walker and lives in assisted living. She participates in exercise classes at [facility]. She had a bone density study on 01/02/23 which showed a T score of -2.7 indicating osteoporosis. We are here to discuss treatment options. She has had several falls and is at risk for fracture due to falls.   Past Medical History:  Diagnosis Date   Actinic keratosis 09/23/2012   Anal fissure and fistula 07/28/2008   Arthralgia of temporomandibular joint 12/30/2009   Atrial fibrillation (HCC)    Atrial tachycardia    s/p ablation 2008. [facility] (R free wall Atach)   Atrophy of vulva 09/23/2012   Blepharochalasis 03/01/2011   Cardiac  dysrhythmia, unspecified 11/10/2009   Dermatophytosis of nail 02/28/2008   Dizziness and giddiness 04/03/2007   Dysphagia, unspecified(787.20) 01/17/2012   Edema 11/21/2006   External hemorrhoids without mention of complication 07/28/2008   HTN (hypertension)    Hyperlipidemia    Insomnia, unspecified 11/18/2004   Internal hemorrhoids without mention of complication 02/28/2008   Left bundle branch hemiblock    Loss of weight 03/05/2012   Myalgia and myositis, unspecified 01/05/2009   Orthostatic hypotension 03/22/2007   Other malaise and fatigue 03/05/2012   Pain in joint, site unspecified 02/28/2008   Palpitations 11/21/2006   Personality change due to conditions classified elsewhere    Pterygium eye, right 06/07/2020   Rectocele 09/23/2012   Restless legs syndrome (RLS)    Rosacea 09/23/2012   Sebaceous cyst 03/01/2011   Thumb pain 11/20/2012   Unspecified hereditary and idiopathic peripheral neuropathy    Past Surgical History:  Procedure Laterality Date   BLADDER SUSPENSION  1998   CATARACT EXTRACTION W/ INTRAOCULAR LENS IMPLANT Right 4/8//2008   Dr. Emmit Pomfret   catheter ablation  2008   for atrial tachycardia   CYST EXCISION  1998   mucus cyst removal from finger   PTERYGIUM EXCISION  06/07/2020   TOTAL ABDOMINAL HYSTERECTOMY  2001   uterine bleeding    Allergies  Allergen Reactions   Erythromycin Nausea And Vomiting   Flecainide Acetate     Postural hypotension   Penicillins     Outpatient Encounter Medications as of 01/12/2023  Medication Sig   acetaminophen (TYLENOL) 500 MG tablet Take 1,000 mg by mouth every 8 (eight) hours as  needed.   amiodarone (PACERONE) 100 MG tablet Take 100 mg by mouth daily.   amLODipine (NORVASC) 2.5 MG tablet Take 2.5 mg by mouth daily.   Artificial Tear Solution (GENTEAL TEARS OP) Apply 1 drop to eye 2 (two) times daily.   cholecalciferol (VITAMIN D3) 25 MCG (1000 UNIT) tablet Take 1,000 Units by mouth daily.   dextromethorphan-guaiFENesin (ROBAFEN DM  COUGH) 10-100 MG/5ML liquid Take 10 mLs by mouth every 6 (six) hours as needed for cough.   diclofenac Sodium (VOLTAREN) 1 % GEL Apply topically 2 (two) times daily. May self apply to left knee/hip prn discomfort   diltiazem (CARDIZEM) 60 MG tablet Take 1 tablet (60 mg total) by mouth 3 (three) times daily as needed (For heart rate >110 bpm).   furosemide (LASIX) 20 MG tablet TAKE 1 TABLET ONCE DAILY AS NEEDED FOR SWELLING- TAKE 1/2 TABLET FOR LEG SWELLING AS NEEDED.   hydrALAZINE (APRESOLINE) 10 MG tablet Take 10 mg by mouth 3 (three) times daily as needed.   hydrALAZINE (APRESOLINE) 25 MG tablet Take 25 mg by mouth 2 (two) times daily.   ipratropium (ATROVENT) 0.03 % nasal spray Place 2 sprays into both nostrils every 12 (twelve) hours.   levalbuterol (XOPENEX) 0.63 MG/3ML nebulizer solution Take 0.63 mg by nebulization 2 (two) times daily as needed for wheezing or shortness of breath.   loratadine (CLARITIN) 10 MG tablet Take 1 tablet (10 mg total) by mouth daily.   Multiple Vitamins-Minerals (MULTIVITAMIN ADULTS 50+) TABS Take 1 tablet by mouth daily.   Multiple Vitamins-Minerals (PRESERVISION AREDS PO) Take 2 tablets by mouth daily.    polyethylene glycol (MIRALAX / GLYCOLAX) 17 g packet Take 17 g by mouth daily as needed (and 1/2 up in the morning scheduled).   Rivaroxaban (XARELTO) 15 MG TABS tablet Take 1 tablet (15 mg total) by mouth daily with supper.   rosuvastatin (CRESTOR) 10 MG tablet Take 1 tablet (10 mg total) by mouth daily.   Sod Fluoride-Potassium Nitrate (PREVIDENT 5000 ENAMEL PROTECT) 1.1-5 % GEL Place 1 Application onto teeth at bedtime.   sodium chloride (OCEAN) 0.65 % nasal spray Place 2 sprays into the nose every 4 (four) hours as needed for congestion.   losartan (COZAAR) 25 MG tablet Take 1 tablet (25 mg total) by mouth every evening.   [DISCONTINUED] amiodarone (PACERONE) 200 MG tablet Take 0.5 tablets (100 mg total) by mouth daily.   [DISCONTINUED] amLODipine (NORVASC)  5 MG tablet Take 5 mg by mouth daily.   No facility-administered encounter medications on file as of 01/12/2023.    Review of Systems  Constitutional:  Negative for activity change, appetite change, chills, diaphoresis, fatigue, fever and unexpected weight change.  HENT:  Negative for congestion.   Respiratory:  Negative for cough, shortness of breath and wheezing.   Cardiovascular:  Negative for chest pain, palpitations and leg swelling.  Gastrointestinal:  Negative for abdominal distention, abdominal pain, constipation and diarrhea.  Genitourinary:  Negative for difficulty urinating and dysuria.  Musculoskeletal:  Positive for gait problem (uses a walker.). Negative for arthralgias, back pain, joint swelling and myalgias.  Neurological:  Negative for dizziness, tremors, seizures, syncope, facial asymmetry, speech difficulty, weakness, light-headedness, numbness and headaches.  Psychiatric/Behavioral:  Negative for agitation, behavioral problems and confusion.        Short term memory loss    Immunization History  Administered Date(s) Administered   Fluad Quad(high Dose 65+) 05/19/2022   Influenza Whole 06/12/2012   Influenza, High Dose Seasonal PF 05/23/2019, 06/11/2020  Influenza,inj,Quad PF,6+ Mos 05/13/2013, 06/07/2018   Influenza-Unspecified 05/27/2014, 04/24/2015, 04/13/2016, 04/24/2017, 06/11/2020, 06/11/2021   Moderna Covid-19 Vaccine Bivalent Booster 51yrs & up 06/21/2021   Moderna SARS-COV2 Booster Vaccination 04/18/2021   Moderna Sars-Covid-2 Vaccination 08/26/2019, 09/23/2019, 06/29/2020, 06/14/2022   Pneumococcal Conjugate-13 05/27/2014   Pneumococcal Polysaccharide-23 08/14/1997   RSV,unspecified 08/09/2022   Td 04/15/2011   Tdap 07/15/2015   Zoster Recombinat (Shingrix) 02/12/2017, 06/28/2017   Zoster, Live 08/15/2007   Pertinent  Health Maintenance Due  Topic Date Due   INFLUENZA VACCINE  03/15/2023   DEXA SCAN  Completed      12/19/2021   11:07 AM 09/11/2022     3:54 PM 10/02/2022    9:39 AM 12/19/2022    1:46 PM 12/25/2022    1:05 PM  Fall Risk  Falls in the past year? 0 0 0 0 0  Was there an injury with Fall? 0 0 0 0 0  Fall Risk Category Calculator 0 0 0 0 0  Fall Risk Category (Retired) Low      (RETIRED) Patient Fall Risk Level Low fall risk      Patient at Risk for Falls Due to No Fall Risks   No Fall Risks No Fall Risks  Fall risk Follow up Falls evaluation completed Falls evaluation completed Falls evaluation completed Falls evaluation completed Falls evaluation completed   Functional Status Survey:    Vitals:   01/12/23 0837  BP: (!) 155/80  Pulse: 74  Resp: 18  Temp: (!) 97 F (36.1 C)  TempSrc: Temporal  SpO2: 95%  Weight: 160 lb 12.8 oz (72.9 kg)  Height: 5\' 4"  (1.626 m)   Body mass index is 27.6 kg/m. Physical Exam  Labs reviewed: Recent Labs    06/10/22 0000  NA 142  K 3.8  CL 104  CO2 22  BUN 18  CREATININE 0.9  CALCIUM 8.8   Recent Labs    06/10/22 0000  AST 32  ALT 20  ALKPHOS 79  ALBUMIN 4.2   Recent Labs    05/11/22 0000 06/10/22 0000  WBC 5.1 4.5  HGB 12.4 14.4  HCT 37 42  PLT 246 232   Lab Results  Component Value Date   TSH 3.94 12/05/2021   No results found for: "HGBA1C" Lab Results  Component Value Date   CHOL 161 03/23/2022   HDL 76 (A) 03/23/2022   LDLCALC 75 03/23/2022   TRIG 52 03/23/2022    Significant Diagnostic Results in last 30 days:  No results found.  Assessment/Plan  1. Age-related osteoporosis without current pathological fracture  - alendronate (FOSAMAX) 70 MG tablet; Take 1 tablet (70 mg total) by mouth every 7 (seven) days. Take with a full glass of water on an empty stomach. Sit upright for 30 min after administration  Dispense: 4 tablet; Refill: 11  Discussed monitoring for difficulty swallowing with the resident and her nurse.  Encourage weight bearing exercise Adequate dietary intake of calcium. Already takes vitamin D  Family/ staff  Communication: discussed with resident and nurse.   Labs/tests ordered:  NA   Total time :  time greater than 50% of total time spent doing pt counseling and coordination of care

## 2023-01-13 ENCOUNTER — Encounter: Payer: Self-pay | Admitting: Adult Health

## 2023-01-13 DIAGNOSIS — M810 Age-related osteoporosis without current pathological fracture: Secondary | ICD-10-CM | POA: Insufficient documentation

## 2023-01-13 MED ORDER — ALENDRONATE SODIUM 70 MG PO TABS
70.0000 mg | ORAL_TABLET | ORAL | 11 refills | Status: AC
Start: 2023-01-13 — End: ?

## 2023-01-29 DIAGNOSIS — H43393 Other vitreous opacities, bilateral: Secondary | ICD-10-CM | POA: Diagnosis not present

## 2023-01-29 DIAGNOSIS — H353231 Exudative age-related macular degeneration, bilateral, with active choroidal neovascularization: Secondary | ICD-10-CM | POA: Diagnosis not present

## 2023-01-29 DIAGNOSIS — H04123 Dry eye syndrome of bilateral lacrimal glands: Secondary | ICD-10-CM | POA: Diagnosis not present

## 2023-01-29 DIAGNOSIS — H43813 Vitreous degeneration, bilateral: Secondary | ICD-10-CM | POA: Diagnosis not present

## 2023-01-31 DIAGNOSIS — L814 Other melanin hyperpigmentation: Secondary | ICD-10-CM | POA: Diagnosis not present

## 2023-01-31 DIAGNOSIS — L821 Other seborrheic keratosis: Secondary | ICD-10-CM | POA: Diagnosis not present

## 2023-01-31 DIAGNOSIS — L570 Actinic keratosis: Secondary | ICD-10-CM | POA: Diagnosis not present

## 2023-01-31 DIAGNOSIS — L853 Xerosis cutis: Secondary | ICD-10-CM | POA: Diagnosis not present

## 2023-02-20 ENCOUNTER — Encounter: Payer: Self-pay | Admitting: Orthopedic Surgery

## 2023-02-20 ENCOUNTER — Non-Acute Institutional Stay: Payer: Medicare PPO | Admitting: Orthopedic Surgery

## 2023-02-20 DIAGNOSIS — R600 Localized edema: Secondary | ICD-10-CM | POA: Diagnosis not present

## 2023-02-20 DIAGNOSIS — I872 Venous insufficiency (chronic) (peripheral): Secondary | ICD-10-CM | POA: Diagnosis not present

## 2023-02-20 DIAGNOSIS — R635 Abnormal weight gain: Secondary | ICD-10-CM | POA: Diagnosis not present

## 2023-02-20 MED ORDER — FUROSEMIDE 20 MG PO TABS
20.00 mg | ORAL_TABLET | Freq: Every day | ORAL | 0 refills | Status: AC | PRN
Start: 2023-02-20 — End: ?

## 2023-02-20 NOTE — Progress Notes (Signed)
Careteam: Patient Care Team: Mahlon Gammon, MD as PCP - General (Internal Medicine) Community, [facility], Fredrik Cove, NP as Nurse Practitioner (Geriatric Medicine) Yates Decamp, MD as Consulting Physician (Cardiology) Teresa Coombs, MD as Consulting Physician (Ophthalmology)  Seen by: Hazle Nordmann, AGNP-C  PLACE OF SERVICE:  [facility]- assisted living unit Advanced Directive information Does Patient Have a Medical Advance Directive?: Yes, Type of Advance Directive: Healthcare Power of [place];Living will;Out of facility DNR (pink MOST or yellow form), Does patient want to make changes to medical advance directive?: No - Patient declined  Allergies  Allergen Reactions   Erythromycin Nausea And Vomiting   Flecainide Acetate     Postural hypotension   Penicillins     Chief Complaint  Patient presents with   Acute Visit    Weight gain.     HPI: Patient is a 89 y.o. female seen today foe acute visit due to weight gain.   She currently resides on the assisted living unit at [facility]. PMH: atrial fibrillation, HTN, HLD, left carotid stenosis, venous insufficiency, gout arthritis, osteoporosis, cognitive impairment, and insomnia.   Recent weights see below. She denies chest pain, weight fluctuations, or shortness of breath. She does have ankle edema and wears compression stockings. She has order for furosemide 20 mg daily prn. Denies increased edema. TSH 3.52 10/17/2022. Admits to eating well since move to [place]. She also participates in offered outings to restaurants a few times monthly. Afebrile. Vitals stable.   Recent weights:  07/01- 164.6 lbs  06/01- 154.6 bs  05/01- 160.8 lbs     Review of Systems:  Review of Systems  Constitutional: Negative.   HENT: Negative.    Eyes: Negative.   Respiratory:  Negative for cough, shortness of breath and wheezing.   Cardiovascular:  Positive for leg swelling. Negative for chest pain.  Gastrointestinal:  Negative.   Genitourinary: Negative.   Musculoskeletal: Negative.   Skin: Negative.   Neurological: Negative.   Psychiatric/Behavioral: Negative.      Past Medical History:  Diagnosis Date   Actinic keratosis 09/23/2012   Anal fissure and fistula 07/28/2008   Arthralgia of temporomandibular joint 12/30/2009   Atrial fibrillation (HCC)    Atrial tachycardia    s/p ablation 2008. [facility] (R free wall Atach)   Atrophy of vulva 09/23/2012   Blepharochalasis 03/01/2011   Cardiac dysrhythmia, unspecified 11/10/2009   Dermatophytosis of nail 02/28/2008   Dizziness and giddiness 04/03/2007   Dysphagia, unspecified(787.20) 01/17/2012   Edema 11/21/2006   External hemorrhoids without mention of complication 07/28/2008   HTN (hypertension)    Hyperlipidemia    Insomnia, unspecified 11/18/2004   Internal hemorrhoids without mention of complication 02/28/2008   Left bundle branch hemiblock    Loss of weight 03/05/2012   Myalgia and myositis, unspecified 01/05/2009   Orthostatic hypotension 03/22/2007   Other malaise and fatigue 03/05/2012   Pain in joint, site unspecified 02/28/2008   Palpitations 11/21/2006   Personality change due to conditions classified elsewhere    Pterygium eye, right 06/07/2020   Rectocele 09/23/2012   Restless legs syndrome (RLS)    Rosacea 09/23/2012   Sebaceous cyst 03/01/2011   Thumb pain 11/20/2012   Unspecified hereditary and idiopathic peripheral neuropathy    Past Surgical History:  Procedure Laterality Date   BLADDER SUSPENSION  1998   CATARACT EXTRACTION W/ INTRAOCULAR LENS IMPLANT Right 4/8//2008   Dr. Emmit Pomfret   catheter ablation  2008   for atrial tachycardia   CYST EXCISION  1998   mucus cyst removal from finger   PTERYGIUM EXCISION  06/07/2020   TOTAL ABDOMINAL HYSTERECTOMY  2001   uterine bleeding   Social History:   reports that she has never smoked. She has never used smokeless tobacco. She reports current alcohol use. She reports that she does not use  drugs.  Family History  Problem Relation Age of Onset   Heart disease Mother    Heart disease Father    Cancer Sister        breast    Medications: Patient's Medications  New Prescriptions   No medications on file  Previous Medications   ACETAMINOPHEN (TYLENOL) 500 MG TABLET    Take 1,000 mg by mouth every 8 (eight) hours as needed.   ALENDRONATE (FOSAMAX) 70 MG TABLET    Take 1 tablet (70 mg total) by mouth every 7 (seven) days. Take with a full glass of water on an empty stomach. Sit upright for 30 min after administration   AMIODARONE (PACERONE) 100 MG TABLET    Take 100 mg by mouth daily.   ARTIFICIAL TEAR SOLUTION (GENTEAL TEARS OP)    Apply 1 drop to eye 2 (two) times daily.   CHOLECALCIFEROL (VITAMIN D3) 25 MCG (1000 UNIT) TABLET    Take 1,000 Units by mouth daily.   DICLOFENAC SODIUM (VOLTAREN) 1 % GEL    Apply topically 2 (two) times daily. May self apply to left knee/hip prn discomfort   DILTIAZEM (CARDIZEM) 60 MG TABLET    Take 1 tablet (60 mg total) by mouth 3 (three) times daily as needed (For heart rate >110 bpm).   FUROSEMIDE (LASIX) 20 MG TABLET    TAKE 1 TABLET ONCE DAILY AS NEEDED FOR SWELLING- TAKE 1/2 TABLET FOR LEG SWELLING AS NEEDED.   HYDRALAZINE (APRESOLINE) 10 MG TABLET    Take 10 mg by mouth 3 (three) times daily as needed.   HYDRALAZINE (APRESOLINE) 25 MG TABLET    Take 25 mg by mouth 2 (two) times daily.   IPRATROPIUM (ATROVENT) 0.03 % NASAL SPRAY    Place 2 sprays into both nostrils every 12 (twelve) hours.   LEVALBUTEROL (XOPENEX) 0.63 MG/3ML NEBULIZER SOLUTION    Take 0.63 mg by nebulization 2 (two) times daily as needed for wheezing or shortness of breath.   LORATADINE (CLARITIN) 10 MG TABLET    Take 1 tablet (10 mg total) by mouth daily.   LOSARTAN (COZAAR) 25 MG TABLET    Take 1 tablet (25 mg total) by mouth every evening.   METAMUCIL FIBER PO    Take by mouth at bedtime.   MULTIPLE VITAMINS-MINERALS (MULTIVITAMIN ADULTS 50+) TABS    Take 1 tablet by  mouth daily.   MULTIPLE VITAMINS-MINERALS (PRESERVISION AREDS PO)    Take 2 tablets by mouth daily.    RIVAROXABAN (XARELTO) 15 MG TABS TABLET    Take 1 tablet (15 mg total) by mouth daily with supper.   ROSUVASTATIN (CRESTOR) 10 MG TABLET    Take 1 tablet (10 mg total) by mouth daily.   SOD FLUORIDE-POTASSIUM NITRATE (PREVIDENT 5000 ENAMEL PROTECT) 1.1-5 % GEL    Place 1 Application onto teeth at bedtime.  Modified Medications   No medications on file  Discontinued Medications   AMLODIPINE (NORVASC) 2.5 MG TABLET    Take 2.5 mg by mouth daily.   DEXTROMETHORPHAN-GUAIFENESIN (ROBAFEN DM COUGH) 10-100 MG/5ML LIQUID    Take 10 mLs by mouth every 6 (six) hours as needed for cough.   POLYETHYLENE  GLYCOL (MIRALAX / GLYCOLAX) 17 G PACKET    Take 17 g by mouth daily as needed (and 1/2 up in the morning scheduled).   SODIUM CHLORIDE (OCEAN) 0.65 % NASAL SPRAY    Place 2 sprays into the nose every 4 (four) hours as needed for congestion.    Physical Exam:  Vitals:   02/20/23 1536  BP: 138/75  Pulse: 66  Resp: 17  Temp: (!) 97.5 F (36.4 C)  SpO2: 95%  Weight: 164 lb 9.6 oz (74.7 kg)  Height: 5\' 4"  (1.626 m)   Body mass index is 28.25 kg/m. Wt Readings from Last 3 Encounters:  02/20/23 164 lb 9.6 oz (74.7 kg)  01/12/23 160 lb 12.8 oz (72.9 kg)  12/25/22 162 lb (73.5 kg)    Physical Exam Vitals reviewed.  Constitutional:      General: She is not in acute distress. HENT:     Head: Normocephalic.  Eyes:     General:        Right eye: No discharge.        Left eye: No discharge.  Cardiovascular:     Rate and Rhythm: Normal rate and regular rhythm.     Pulses: Normal pulses.     Heart sounds: Normal heart sounds.  Pulmonary:     Effort: Pulmonary effort is normal. No respiratory distress.     Breath sounds: Normal breath sounds. No wheezing or rales.  Abdominal:     General: Bowel sounds are normal.     Palpations: Abdomen is soft.  Musculoskeletal:     Cervical back: Neck  supple.     Right lower leg: Edema present.     Left lower leg: Edema present.     Comments: Non pitting   Skin:    General: Skin is warm.     Capillary Refill: Capillary refill takes less than 2 seconds.  Neurological:     General: No focal deficit present.     Mental Status: She is alert and oriented to person, place, and time.  Psychiatric:        Mood and Affect: Mood normal.        Behavior: Behavior normal.     Labs reviewed: Basic Metabolic Panel: Recent Labs    06/10/22 0000 10/17/22 0000  NA 142 141  K 3.8 4.4  CL 104 107  CO2 22 22  BUN 18 22*  CREATININE 0.9 0.9  CALCIUM 8.8 8.8  TSH  --  3.52   Liver Function Tests: Recent Labs    06/10/22 0000  AST 32  ALT 20  ALKPHOS 79  ALBUMIN 4.2   No results for input(s): "LIPASE", "AMYLASE" in the last 8760 hours. No results for input(s): "AMMONIA" in the last 8760 hours. CBC: Recent Labs    05/11/22 0000 06/10/22 0000  WBC 5.1 4.5  HGB 12.4 14.4  HCT 37 42  PLT 246 232   Lipid Panel: Recent Labs    03/23/22 1505  CHOL 161  HDL 76*  LDLCALC 75  TRIG 52   TSH: Recent Labs    10/17/22 0000  TSH 3.52   A1C: No results found for: "HGBA1C"   Assessment/Plan: 1. Weight gain - BMI 28.25 - weight up 10 lbs from last month - TSH normal - re weight patient - cont monthly weights  2. Venous insufficiency of both lower extremities - ongoing - see above> no concerns for CHF - non pitting edema to BLE - cont compression stocking daily -  cont furosemide po daily prn for increased leg swelling   Next appt: none Brenee Gajda Scherry Ran  [facility] [phone]

## 2023-03-26 ENCOUNTER — Encounter: Payer: Self-pay | Admitting: Adult Health

## 2023-03-26 ENCOUNTER — Non-Acute Institutional Stay: Payer: Medicare PPO | Admitting: Adult Health

## 2023-03-26 VITALS — BP 132/72 | HR 67 | Temp 98.0°F | Resp 16 | Ht 64.0 in | Wt 163.5 lb

## 2023-03-26 DIAGNOSIS — E7800 Pure hypercholesterolemia, unspecified: Secondary | ICD-10-CM

## 2023-03-26 DIAGNOSIS — M810 Age-related osteoporosis without current pathological fracture: Secondary | ICD-10-CM

## 2023-03-26 DIAGNOSIS — G3184 Mild cognitive impairment, so stated: Secondary | ICD-10-CM | POA: Diagnosis not present

## 2023-03-26 DIAGNOSIS — I10 Essential (primary) hypertension: Secondary | ICD-10-CM

## 2023-03-26 DIAGNOSIS — I4819 Other persistent atrial fibrillation: Secondary | ICD-10-CM | POA: Diagnosis not present

## 2023-03-26 NOTE — Progress Notes (Unsigned)
Location:  [facility]  [id number]  Provider:  Peggye Ley, ANP [facility] [phone]   Code Status: DNR Goals of Care:     03/26/2023    1:00 PM  Advanced Directives  Does Patient Have a Medical Advance Directive? Yes  Type of Estate agent of [place];Living will;Out of facility DNR (pink MOST or yellow form)  Does patient want to make changes to medical advance directive? No - Patient declined  Copy of Healthcare Power of Attorney in Chart? Yes - validated most recent copy scanned in chart (See row information)     Chief Complaint  Patient presents with  . Medical Management of Chronic Issues    Patient is being seen for 3 month follow up  . Immunizations    Discuss the need for flu vaccine     HPI: Patient is a 89 y.o. female seen today for medical management of chronic diseases.    OP: bone density study on 01/02/23 which showed a T score of -2.7 indicating osteoporosis. Started on Fosamax May 2024 Denies any indigestion or trouble swallowing  MCI:  MMSE 28/30 passed clock 08/17/22  Currently living in [facility] and doing well  Gets up 3-4 times night to urinate otherwise sleeping well.   Afib: follows with Dr Jacinto Halim Weighing herself daily  with no acute changes No swelling, wears compression hose  Bilateral carotid stenosis asymptomatic last carotid duplex per Dr Jacinto Halim 2022  HTN controlled  HLD Lab Results  Component Value Date   LDLCALC 75 03/23/2022    Has wet AMD followed with ophthalmology  Getting Vabysmo injections to both eyes  Past Medical History:  Diagnosis Date  . Actinic keratosis 09/23/2012  . 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 Arthralgia of temporomandibular joint 12/30/2009  . Atrial fibrillation (HCC)   . Atrial tachycardia    s/p ablation 2008. [facility] (R free wall Atach)  . Atrophy of vulva 09/23/2012  . Blepharochalasis 03/01/2011  . Cardiac dysrhythmia, unspecified 11/10/2009  .  Dermatophytosis of nail 02/28/2008  . Dizziness and giddiness 04/03/2007  . Dysphagia, unspecified(787.20) 01/17/2012  . Edema 11/21/2006  . External hemorrhoids without mention of complication 07/28/2008  . HTN (hypertension)   . Hyperlipidemia   . Insomnia, unspecified 11/18/2004  . Internal hemorrhoids without mention of complication 02/28/2008  . Left bundle branch hemiblock   . Loss of weight 03/05/2012  . Myalgia and myositis, unspecified 01/05/2009  . Orthostatic hypotension 03/22/2007  . Other malaise and fatigue 03/05/2012  . Pain in joint, site unspecified 02/28/2008  . Palpitations 11/21/2006  . Personality change due to conditions classified elsewhere   . Pterygium eye, right 06/07/2020  . Rectocele 09/23/2012  . Restless legs syndrome (RLS)   . Rosacea 09/23/2012  . Sebaceous cyst 03/01/2011  . Thumb pain 11/20/2012  . Unspecified hereditary and idiopathic peripheral neuropathy     Past Surgical History:  Procedure Laterality Date  . BLADDER SUSPENSION  1998  . CATARACT EXTRACTION W/ INTRAOCULAR LENS IMPLANT Right 4/8//2008   Dr. Emmit Pomfret  . catheter ablation  2008   for atrial tachycardia  . CYST EXCISION  1998   mucus cyst removal from finger  . PTERYGIUM EXCISION  06/07/2020  . TOTAL ABDOMINAL HYSTERECTOMY  2001   uterine bleeding    Allergies  Allergen Reactions  . Erythromycin Nausea And Vomiting  . Flecainide Acetate     Postural hypotension  . Penicillins     Outpatient Encounter Medications as of 03/26/2023  Medication Sig  . acetaminophen (TYLENOL) 500 MG tablet Take 1,000 mg by mouth every 8 (eight) hours as needed.  Marland Kitchen alendronate (FOSAMAX) 70 MG tablet Take 1 tablet (70 mg total) by mouth every 7 (seven) days. Take with a full glass of water on an empty stomach. Sit upright for 30 min after administration  . amiodarone (PACERONE) 100 MG tablet Take 100 mg by mouth daily.  Marland Kitchen amLODipine (NORVASC) 2.5 MG tablet Take 2.5 mg by mouth daily.  . Artificial Tear Solution  (GENTEAL TEARS OP) Apply 1 drop to eye 2 (two) times daily.  . cholecalciferol (VITAMIN D3) 25 MCG (1000 UNIT) tablet Take 1,000 Units by mouth daily.  . diclofenac Sodium (VOLTAREN) 1 % GEL Apply topically 2 (two) times daily. May self apply to left knee/hip prn discomfort  . diltiazem (CARDIZEM) 60 MG tablet Take 1 tablet (60 mg total) by mouth 3 (three) times daily as needed (For heart rate >110 bpm).  . furosemide (LASIX) 20 MG tablet Take 1 tablet (20 mg total) by mouth daily as needed (for increased lower leg swelling.).  Marland Kitchen hydrALAZINE (APRESOLINE) 10 MG tablet Take 10 mg by mouth 3 (three) times daily as needed.  . hydrALAZINE (APRESOLINE) 25 MG tablet Take 25 mg by mouth 2 (two) times daily.  Marland Kitchen ipratropium (ATROVENT) 0.03 % nasal spray Place 2 sprays into both nostrils every 12 (twelve) hours.  Marland Kitchen levalbuterol (XOPENEX) 0.63 MG/3ML nebulizer solution Take 0.63 mg by nebulization 2 (two) times daily as needed for wheezing or shortness of breath.  . loratadine (CLARITIN) 10 MG tablet Take 1 tablet (10 mg total) by mouth daily.  Marland Kitchen METAMUCIL FIBER PO Take by mouth at bedtime.  . Multiple Vitamins-Minerals (MULTIVITAMIN ADULTS 50+) TABS Take 1 tablet by mouth daily.  . Multiple Vitamins-Minerals (PRESERVISION AREDS PO) Take 2 tablets by mouth daily.   . Rivaroxaban (XARELTO) 15 MG TABS tablet Take 1 tablet (15 mg total) by mouth daily with supper.  . rosuvastatin (CRESTOR) 10 MG tablet Take 1 tablet (10 mg total) by mouth daily.  . Sod Fluoride-Potassium Nitrate (PREVIDENT 5000 ENAMEL PROTECT) 1.1-5 % GEL Place 1 Application onto teeth at bedtime.  Marland Kitchen losartan (COZAAR) 25 MG tablet Take 1 tablet (25 mg total) by mouth every evening.   No facility-administered encounter medications on file as of 03/26/2023.    Review of Systems:  Review of Systems  Constitutional:  Negative for activity change, appetite change, chills, diaphoresis, fatigue, fever and unexpected weight change.  HENT:  Positive  for hearing loss. Negative for congestion, ear discharge, ear pain, rhinorrhea and trouble swallowing.   Respiratory:  Negative for cough, shortness of breath and wheezing.   Cardiovascular:  Positive for leg swelling. Negative for chest pain and palpitations.  Gastrointestinal:  Positive for diarrhea (loose stool whens standing). Negative for abdominal distention, abdominal pain and constipation.  Genitourinary:  Negative for difficulty urinating and dysuria.  Musculoskeletal:  Negative for arthralgias, back pain, gait problem, joint swelling and myalgias.  Neurological:  Negative for dizziness, tremors, seizures, syncope, facial asymmetry, speech difficulty, weakness, light-headedness, numbness and headaches.  Psychiatric/Behavioral:  Negative for agitation, behavioral problems and confusion.        Memory loss    Health Maintenance  Topic Date Due  . INFLUENZA VACCINE  03/15/2023  . COVID-19 Vaccine (7 - 2023-24 season) 04/11/2023 (Originally 02/02/2023)  . Medicare Annual Wellness (AWV)  12/25/2023  . DTaP/Tdap/Td (3 - Td or Tdap) 07/14/2025  . Pneumonia Vaccine 65+ Years  old  Completed  . DEXA SCAN  Completed  . Zoster Vaccines- Shingrix  Completed  . HPV VACCINES  Aged Out    Physical Exam: Vitals:   03/26/23 1254  BP: 132/72  Pulse: 67  Resp: 16  Temp: 98 F (36.7 C)  TempSrc: Temporal  SpO2: 94%  Weight: 163 lb 8 oz (74.2 kg)  Height: 5\' 4"  (1.626 m)   Body mass index is 28.06 kg/m. Physical Exam Vitals and nursing note reviewed.  Constitutional:      General: She is not in acute distress.    Appearance: She is not diaphoretic.  HENT:     Head: Normocephalic and atraumatic.     Right Ear: Tympanic membrane, ear canal and external ear normal.     Left Ear: Tympanic membrane, ear canal and external ear normal.     Nose: No congestion or rhinorrhea.     Mouth/Throat:     Mouth: Mucous membranes are moist.     Pharynx: Oropharynx is clear.  Eyes:      Conjunctiva/sclera: Conjunctivae normal.     Pupils: Pupils are equal, round, and reactive to light.     Comments: Dry eyes with erythema to sclera  Neck:     Vascular: No JVD.  Cardiovascular:     Rate and Rhythm: Normal rate and regular rhythm.     Heart sounds: No murmur heard. Pulmonary:     Effort: Pulmonary effort is normal. No respiratory distress.     Breath sounds: Normal breath sounds. No wheezing.  Abdominal:     General: Abdomen is flat. Bowel sounds are normal. There is no distension.     Palpations: Abdomen is soft.     Tenderness: There is no abdominal tenderness.  Musculoskeletal:     Cervical back: No rigidity or tenderness.     Comments: Trace bilateral ankle edema  Lymphadenopathy:     Cervical: No cervical adenopathy.  Skin:    General: Skin is warm and dry.  Neurological:     Mental Status: She is alert and oriented to person, place, and time.  Psychiatric:        Mood and Affect: Mood normal.    Labs reviewed: Basic Metabolic Panel: Recent Labs    06/10/22 0000 10/17/22 0000  NA 142 141  K 3.8 4.4  CL 104 107  CO2 22 22  BUN 18 22*  CREATININE 0.9 0.9  CALCIUM 8.8 8.8  TSH  --  3.52   Liver Function Tests: Recent Labs    06/10/22 0000  AST 32  ALT 20  ALKPHOS 79  ALBUMIN 4.2   No results for input(s): "LIPASE", "AMYLASE" in the last 8760 hours. No results for input(s): "AMMONIA" in the last 8760 hours. CBC: Recent Labs    05/11/22 0000 06/10/22 0000  WBC 5.1 4.5  HGB 12.4 14.4  HCT 37 42  PLT 246 232   Lipid Panel: No results for input(s): "CHOL", "HDL", "LDLCALC", "TRIG", "CHOLHDL", "LDLDIRECT" in the last 8760 hours.  No results found for: "HGBA1C"  Procedures since last visit: No results found.  Assessment/Plan   1. Mild cognitive impairment Doing well in AL  Has some short term memory loss Very active and social   2. Essential hypertension Controlled  3. Persistent atrial fibrillation (HCC) Followed by  cardiology On xarelto for CVA risk reduction Rate is controlled on diltiazem and amiodarone  4. Stenosis of left carotid artery Followed by cardiology on statin and xarelto, no symptoms.  Very mild per cardiology notes, no further f/u needed   5. Pure hypercholesterolemia . Lab Results  Component Value Date   LDLCALC 75 03/23/2022   On crestor   6. Osteoporosis  On Fosamax started May of 2024 Encourage weight bearing exercise  Ok to change weights to Monday Wednesday Friday Concern for weight gain before, now stabilized.     Labs/tests ordered:  * No order type specified * CBC CMP TSH Lipid panel in one  month Next appt:  3 months with Dr Chales Abrahams

## 2023-03-27 ENCOUNTER — Ambulatory Visit: Payer: Medicare PPO | Admitting: Cardiology

## 2023-03-27 ENCOUNTER — Encounter: Payer: Self-pay | Admitting: Cardiology

## 2023-03-27 VITALS — BP 136/69 | HR 60 | Resp 16 | Ht 64.0 in | Wt 166.0 lb

## 2023-03-27 DIAGNOSIS — I480 Paroxysmal atrial fibrillation: Secondary | ICD-10-CM | POA: Diagnosis not present

## 2023-03-27 DIAGNOSIS — H04123 Dry eye syndrome of bilateral lacrimal glands: Secondary | ICD-10-CM | POA: Diagnosis not present

## 2023-03-27 DIAGNOSIS — H353231 Exudative age-related macular degeneration, bilateral, with active choroidal neovascularization: Secondary | ICD-10-CM | POA: Diagnosis not present

## 2023-03-27 DIAGNOSIS — H43813 Vitreous degeneration, bilateral: Secondary | ICD-10-CM | POA: Diagnosis not present

## 2023-03-27 DIAGNOSIS — H43393 Other vitreous opacities, bilateral: Secondary | ICD-10-CM | POA: Diagnosis not present

## 2023-03-27 DIAGNOSIS — I10 Essential (primary) hypertension: Secondary | ICD-10-CM | POA: Diagnosis not present

## 2023-03-27 DIAGNOSIS — D500 Iron deficiency anemia secondary to blood loss (chronic): Secondary | ICD-10-CM

## 2023-03-27 NOTE — Progress Notes (Signed)
Primary Physician/Referring:  Mahlon Gammon, MD  Patient ID: Rebekah Graham, female    DOB: [birth year], 89 y.o.   MRN: [id number]  Chief Complaint  Patient presents with   Paroxysmal atrial fibrillation    Essential hypertension   Follow-up    6 months   HPI:     Rebekah Graham  is a 89 y.o. Caucasian female with history of paroxysmal atrial fibrillation, hypertension and hyperlipidemia.    Patient presents for office visit for management of hypertension and PAF.  She has no specific complaints today.  Past Medical History:  Diagnosis Date   Actinic keratosis 09/23/2012   Anal fissure and fistula 07/28/2008   Arthralgia of temporomandibular joint 12/30/2009   Atrial fibrillation (HCC)    Atrial tachycardia    s/p ablation 2008. [facility] (R free wall Atach)   Atrophy of vulva 09/23/2012   Blepharochalasis 03/01/2011   Cardiac dysrhythmia, unspecified 11/10/2009   Dermatophytosis of nail 02/28/2008   Dizziness and giddiness 04/03/2007   Dysphagia, unspecified(787.20) 01/17/2012   Edema 11/21/2006   External hemorrhoids without mention of complication 07/28/2008   HTN (hypertension)    Hyperlipidemia    Insomnia, unspecified 11/18/2004   Internal hemorrhoids without mention of complication 02/28/2008   Left bundle branch hemiblock    Loss of weight 03/05/2012   Myalgia and myositis, unspecified 01/05/2009   Orthostatic hypotension 03/22/2007   Other malaise and fatigue 03/05/2012   Pain in joint, site unspecified 02/28/2008   Palpitations 11/21/2006   Personality change due to conditions classified elsewhere    Pterygium eye, right 06/07/2020   Rectocele 09/23/2012   Restless legs syndrome (RLS)    Rosacea 09/23/2012   Sebaceous cyst 03/01/2011   Thumb pain 11/20/2012   Unspecified hereditary and idiopathic peripheral neuropathy    Past Surgical History:  Procedure Laterality Date   BLADDER SUSPENSION  1998   CATARACT EXTRACTION W/ INTRAOCULAR LENS IMPLANT Right 4/8//2008   Dr. Emmit Pomfret    catheter ablation  2008   for atrial tachycardia   CYST EXCISION  1998   mucus cyst removal from finger   PTERYGIUM EXCISION  06/07/2020   TOTAL ABDOMINAL HYSTERECTOMY  2001   uterine bleeding   Social History   Tobacco Use   Smoking status: Never   Smokeless tobacco: Never   Tobacco comments:    tobacco  - none  Substance Use Topics   Alcohol use: Yes    Comment: 1-2 glasses of wine/gin daily     Family History  Problem Relation Age of Onset   Heart disease Mother    Heart disease Father    Cancer Sister        breast    ROS  Review of Systems  Cardiovascular:  Positive for leg swelling (chronic). Negative for chest pain and dyspnea on exertion.   Objective  Blood pressure 136/69, pulse 60, resp. rate 16, height 5\' 4"  (1.626 m), weight 166 lb (75.3 kg), SpO2 97%. Body mass index is 28.49 kg/m.      03/27/2023   10:17 AM 03/26/2023   12:54 PM 02/20/2023    3:36 PM  Vitals with BMI  Height 5\' 4"  5\' 4"  5\' 4"   Weight 166 lbs 163 lbs 8 oz 164 lbs 10 oz  BMI 28.48 28.05 28.24  Systolic 136 132 213  Diastolic 69 72 75  Pulse 60 67 66     Physical Exam Vitals reviewed.  Neck:     Vascular: Carotid bruit (bilateral) present. No JVD.  Cardiovascular:     Rate and Rhythm: Normal rate and regular rhythm. Occasional Extrasystoles are present.    Pulses: Intact distal pulses.     Heart sounds: S1 normal and S2 normal. No murmur heard.    No gallop.  Pulmonary:     Effort: Pulmonary effort is normal.     Breath sounds: Normal breath sounds.  Abdominal:     General: Bowel sounds are normal.     Palpations: Abdomen is soft.  Musculoskeletal:     Right lower leg: Edema (1-2 plus) present.     Left lower leg: Edema (trace) present.    Laboratory examination:   Lab Results  Component Value Date   NA 141 10/17/2022   K 4.4 10/17/2022   CO2 22 10/17/2022   GLUCOSE 119 (H) 07/20/2021   BUN 22 (A) 10/17/2022   CREATININE 0.9 10/17/2022   CALCIUM 8.8 10/17/2022    EGFR 63 10/17/2022       Latest Ref Rng & Units 10/17/2022   12:00 AM 06/10/2022   12:00 AM 12/05/2021   12:00 AM  CMP  BUN 4 - 21 22     18     19       19       Creatinine 0.5 - 1.1 0.9     0.9     0.9      Sodium 137 - 147 141     142     142      Potassium 3.5 - 5.1 mEq/L 4.4     3.8     4.0      Chloride 99 - 108 107     104     104      CO2 13 - 22 22     22     25       Calcium 8.7 - 10.7 8.8     8.8     8.7      Alkaline Phos 25 - 125  79     64      AST 13 - 35  32     22      ALT 7 - 35 U/L  20     16         This result is from an external source.   Multiple values from one day are sorted in reverse-chronological order      Latest Ref Rng & Units 06/10/2022   12:00 AM 05/11/2022   12:00 AM 12/05/2021   12:00 AM  CBC  WBC  4.5     5.1     4.3      Hemoglobin 12.0 - 16.0 14.4     12.4     11.7      Hematocrit 36 - 46 42     37     35      Platelets 150 - 400 K/uL 232     246     249         This result is from an external source.    Lipid Panel     Component Value Date/Time   CHOL 161 03/23/2022 1505   TRIG 52 03/23/2022 1505   HDL 76 (A) 03/23/2022 1505   LDLCALC 75 03/23/2022 1505   TSH Recent Labs    10/17/22 0000  TSH 3.52    Allergies   Allergies  Allergen Reactions   Erythromycin Nausea And Vomiting   Flecainide Acetate  Postural hypotension   Penicillins      Medications    Current Outpatient Medications:    acetaminophen (TYLENOL) 500 MG tablet, Take 1,000 mg by mouth every 8 (eight) hours as needed., Disp: , Rfl:    alendronate (FOSAMAX) 70 MG tablet, Take 1 tablet (70 mg total) by mouth every 7 (seven) days. Take with a full glass of water on an empty stomach. Sit upright for 30 min after administration, Disp: 4 tablet, Rfl: 11   amiodarone (PACERONE) 100 MG tablet, Take 100 mg by mouth daily., Disp: , Rfl:    amLODipine (NORVASC) 2.5 MG tablet, Take 2.5 mg by mouth daily., Disp: , Rfl:    Artificial Tear Solution (GENTEAL TEARS  OP), Apply 1 drop to eye 2 (two) times daily., Disp: , Rfl:    cholecalciferol (VITAMIN D3) 25 MCG (1000 UNIT) tablet, Take 1,000 Units by mouth daily., Disp: , Rfl:    diclofenac Sodium (VOLTAREN) 1 % GEL, Apply topically 2 (two) times daily. May self apply to left knee/hip prn discomfort, Disp: , Rfl:    diltiazem (CARDIZEM) 60 MG tablet, Take 1 tablet (60 mg total) by mouth 3 (three) times daily as needed (For heart rate >110 bpm)., Disp: 30 tablet, Rfl: 3   furosemide (LASIX) 20 MG tablet, Take 1 tablet (20 mg total) by mouth daily as needed (for increased lower leg swelling.)., Disp: 60 tablet, Rfl: 0   hydrALAZINE (APRESOLINE) 10 MG tablet, Take 10 mg by mouth as needed., Disp: , Rfl:    hydrALAZINE (APRESOLINE) 25 MG tablet, Take 25 mg by mouth 2 (two) times daily., Disp: , Rfl:    ipratropium (ATROVENT) 0.03 % nasal spray, Place 2 sprays into both nostrils every 12 (twelve) hours., Disp: 30 mL, Rfl: 12   levalbuterol (XOPENEX) 0.63 MG/3ML nebulizer solution, Take 0.63 mg by nebulization 2 (two) times daily as needed for wheezing or shortness of breath., Disp: , Rfl:    loratadine (CLARITIN) 10 MG tablet, Take 1 tablet (10 mg total) by mouth daily., Disp: 30 tablet, Rfl: 11   losartan (COZAAR) 25 MG tablet, Take 1 tablet (25 mg total) by mouth every evening., Disp: 30 tablet, Rfl: 2   METAMUCIL FIBER PO, Take by mouth at bedtime., Disp: , Rfl:    Multiple Vitamins-Minerals (MULTIVITAMIN ADULTS 50+) TABS, Take 1 tablet by mouth daily., Disp: , Rfl:    Multiple Vitamins-Minerals (PRESERVISION AREDS PO), Take 2 tablets by mouth daily. , Disp: , Rfl:    Rivaroxaban (XARELTO) 15 MG TABS tablet, Take 1 tablet (15 mg total) by mouth daily with supper., Disp: 30 tablet, Rfl: 12   rosuvastatin (CRESTOR) 10 MG tablet, Take 1 tablet (10 mg total) by mouth daily., Disp: , Rfl:    Sod Fluoride-Potassium Nitrate (PREVIDENT 5000 ENAMEL PROTECT) 1.1-5 % GEL, Place 1 Application onto teeth at bedtime., Disp: ,  Rfl:    sodium fluoride (FLUORISHIELD) 1.1 % GEL dental gel, Place 1 Application onto teeth at bedtime., Disp: , Rfl:   Radiology:  No results found.  Cardiac Studies:   Echo- 07/13/2015 1. Left ventricle cavity is normal in size. Mild concentric hypertrophy of the left ventricle. Normal global wall motion. Calculated EF 66%. 2. Left atrial cavity is mildly dilated. 3. Trace aortic regurgitation. 4. Mild mitral regurgitation. 5. Mild tricuspid regurgitation. No evidence of pulmonary hypertension.  Carotid artery duplex 10/14/2020: Minimal stenosis in the right internal carotid artery (minimal). Stenosis in the right external carotid artery (<50%). Stenosis in the left  internal carotid artery (16-49%). Stenosis in the left external carotid artery (<50%). Antegrade right vertebral artery flow. Antegrade left vertebral artery flow. Compared to 05/03/2020, left ICA stenosis of 50-69% is now 16-49%.  Upper limit of the spectrum.  Overall, no significant change. Follow up in one year is appropriate if clinically indicated.  EKG:    EKG 03/27/2023: Normal sinus rhythm at rate of 64 bpm, normal axis, incomplete right bundle branch block.  Poor R progression, cannot exclude anteroseptal infarct old.  PACs (2).  Compared to 09/26/2022, no significant change, PACs new.  Assessment     ICD-10-CM   1. Paroxysmal atrial fibrillation (HCC)  I48.0 EKG 12-Lead    2. Essential hypertension  I10     3. Iron deficiency anemia due to chronic blood loss  D50.0      Medications Discontinued During This Encounter  Medication Reason   hydrALAZINE (APRESOLINE) 10 MG tablet      No orders of the defined types were placed in this encounter. This patients CHA2DS2-VASc Score 4 (HTN, Age, F)and yearly risk of stroke 4.8%.   Recommendations:    Rebekah Graham  is a 89 y.o. Caucasian female with history of paroxysmal atrial fibrillation, hypertension and hyperlipidemia and stage IIIa chronic kidney disease  presents here for a 59-month office visit.  1. Paroxysmal atrial fibrillation (HCC) Patient has been maintaining sinus rhythm on low-dose of amiodarone.  Thyroid functions test/TSH normal.  No dyspnea, no abnormal pulmonary physical exam  2. Essential hypertension Blood pressure is under good control.  Presently on diltiazem, hydralazine and losartan.  3.  Anemia She is presently on Xarelto for anticoagulation, continue the same.  I noticed that she has trended down with her hemoglobin hence needs iron studies and CBC follow-up.  Will forward a copy to her PCP.  Otherwise stable from cardiac standpoint, I will see her back in 6 months.    Yates Decamp, MD, [facility] 03/30/2023, 4:48 PM Office: [phone] Fax: [phone] Pager: [phone]

## 2023-03-29 DIAGNOSIS — E039 Hypothyroidism, unspecified: Secondary | ICD-10-CM | POA: Diagnosis not present

## 2023-03-29 DIAGNOSIS — D649 Anemia, unspecified: Secondary | ICD-10-CM | POA: Diagnosis not present

## 2023-04-18 DIAGNOSIS — L570 Actinic keratosis: Secondary | ICD-10-CM | POA: Diagnosis not present

## 2023-04-18 DIAGNOSIS — Z85828 Personal history of other malignant neoplasm of skin: Secondary | ICD-10-CM | POA: Diagnosis not present

## 2023-04-18 DIAGNOSIS — D0461 Carcinoma in situ of skin of right upper limb, including shoulder: Secondary | ICD-10-CM | POA: Diagnosis not present

## 2023-04-18 DIAGNOSIS — L821 Other seborrheic keratosis: Secondary | ICD-10-CM | POA: Diagnosis not present

## 2023-04-18 DIAGNOSIS — D225 Melanocytic nevi of trunk: Secondary | ICD-10-CM | POA: Diagnosis not present

## 2023-04-18 DIAGNOSIS — L814 Other melanin hyperpigmentation: Secondary | ICD-10-CM | POA: Diagnosis not present

## 2023-04-24 DIAGNOSIS — R799 Abnormal finding of blood chemistry, unspecified: Secondary | ICD-10-CM | POA: Diagnosis not present

## 2023-04-24 LAB — LIPID PANEL
Cholesterol: 160 (ref 0–200)
HDL: 56 (ref 35–70)
LDL Cholesterol: 90
LDl/HDL Ratio: 2.9
Triglycerides: 70 (ref 40–160)

## 2023-04-24 LAB — HEPATIC FUNCTION PANEL
ALT: 14 U/L (ref 7–35)
AST: 23 (ref 13–35)
Alkaline Phosphatase: 69 (ref 25–125)
Bilirubin, Total: 0.4

## 2023-04-24 LAB — BASIC METABOLIC PANEL
BUN: 22 — AB (ref 4–21)
CO2: 21 (ref 13–22)
Chloride: 103 (ref 99–108)
Creatinine: 0.6 (ref 0.5–1.1)
Glucose: 113
Potassium: 4.5 meq/L (ref 3.5–5.1)
Sodium: 137 (ref 137–147)

## 2023-04-24 LAB — COMPREHENSIVE METABOLIC PANEL
Albumin: 3.9 (ref 3.5–5.0)
Calcium: 8.6 — AB (ref 8.7–10.7)
Globulin: 2
eGFR: 67

## 2023-04-24 LAB — CBC AND DIFFERENTIAL
HCT: 38 (ref 36–46)
Hemoglobin: 13.4 (ref 12.0–16.0)
Platelets: 262 10*3/uL (ref 150–400)
WBC: 4.7

## 2023-04-24 LAB — CBC: RBC: 3.94 (ref 3.87–5.11)

## 2023-06-05 DIAGNOSIS — H353231 Exudative age-related macular degeneration, bilateral, with active choroidal neovascularization: Secondary | ICD-10-CM | POA: Diagnosis not present

## 2023-06-05 DIAGNOSIS — H04123 Dry eye syndrome of bilateral lacrimal glands: Secondary | ICD-10-CM | POA: Diagnosis not present

## 2023-06-05 DIAGNOSIS — H43813 Vitreous degeneration, bilateral: Secondary | ICD-10-CM | POA: Diagnosis not present

## 2023-06-05 DIAGNOSIS — H43393 Other vitreous opacities, bilateral: Secondary | ICD-10-CM | POA: Diagnosis not present

## 2023-06-26 ENCOUNTER — Encounter: Payer: Self-pay | Admitting: Internal Medicine

## 2023-06-26 ENCOUNTER — Non-Acute Institutional Stay: Payer: Medicare PPO | Admitting: Internal Medicine

## 2023-06-26 VITALS — BP 124/68 | HR 74 | Temp 97.7°F | Resp 17 | Ht 64.0 in | Wt 168.8 lb

## 2023-06-26 DIAGNOSIS — G3184 Mild cognitive impairment, so stated: Secondary | ICD-10-CM

## 2023-06-26 DIAGNOSIS — I4819 Other persistent atrial fibrillation: Secondary | ICD-10-CM

## 2023-06-26 DIAGNOSIS — M810 Age-related osteoporosis without current pathological fracture: Secondary | ICD-10-CM

## 2023-06-26 DIAGNOSIS — E7800 Pure hypercholesterolemia, unspecified: Secondary | ICD-10-CM

## 2023-06-26 DIAGNOSIS — I10 Essential (primary) hypertension: Secondary | ICD-10-CM

## 2023-06-26 DIAGNOSIS — H353190 Nonexudative age-related macular degeneration, unspecified eye, stage unspecified: Secondary | ICD-10-CM

## 2023-06-26 NOTE — Progress Notes (Signed)
Location:  [facility] features editor of Service:  Clinic (12)  Provider:   Code Status: DNR Goals of Care:     06/26/2023    1:12 PM  Advanced Directives  Does Patient Have a Medical Advance Directive? Yes  Type of Advance Directive Living will;Out of facility DNR (pink MOST or yellow form)  Does patient want to make changes to medical advance directive? No - Patient declined     Chief Complaint  Patient presents with   Medical Management of Chronic Issues    Patient is being seen for a 3 month follow up     HPI: Patient is a 89 y.o. female seen today for medical management of chronic diseases.    [place]ives in [place]   Patient has a history of PAF, HLD, chronic venous insufficiency, left carotid stenosis and hypertension   Allergic rhinitis Stopped her Flonase Her Symptoms are back   Hypertension  Most of the blood pressures seems in good control PAF doing well with amiodarone TSH in good limits   Patient has gained some weight because she is unable to swim anymore Otherwise she did not have any complaints Walks with her walker and is doing well with her ADLs Past Medical History:  Diagnosis Date   Actinic keratosis 09/23/2012   Anal fissure and fistula 07/28/2008   Arthralgia of temporomandibular joint 12/30/2009   Atrial fibrillation (HCC)    Atrial tachycardia (HCC)    s/p ablation 2008. [facility] (R free wall Atach)   Atrophy of vulva 09/23/2012   Blepharochalasis 03/01/2011   Cardiac dysrhythmia, unspecified 11/10/2009   Dermatophytosis of nail 02/28/2008   Dizziness and giddiness 04/03/2007   Dysphagia, unspecified(787.20) 01/17/2012   Edema 11/21/2006   External hemorrhoids without mention of complication 07/28/2008   HTN (hypertension)    Hyperlipidemia    Insomnia, unspecified 11/18/2004   Internal hemorrhoids without mention of complication 02/28/2008   Left bundle branch hemiblock    Loss of weight 03/05/2012   Myalgia and myositis, unspecified  01/05/2009   Orthostatic hypotension 03/22/2007   Other malaise and fatigue 03/05/2012   Pain in joint, site unspecified 02/28/2008   Palpitations 11/21/2006   Personality change due to conditions classified elsewhere    Pterygium eye, right 06/07/2020   Rectocele 09/23/2012   Restless legs syndrome (RLS)    Rosacea 09/23/2012   Sebaceous cyst 03/01/2011   Thumb pain 11/20/2012   Unspecified hereditary and idiopathic peripheral neuropathy     Past Surgical History:  Procedure Laterality Date   BLADDER SUSPENSION  1998   CATARACT EXTRACTION W/ INTRAOCULAR LENS IMPLANT Right 4/8//2008   Dr. Emmit Pomfret   catheter ablation  2008   for atrial tachycardia   CYST EXCISION  1998   mucus cyst removal from finger   PTERYGIUM EXCISION  06/07/2020   TOTAL ABDOMINAL HYSTERECTOMY  2001   uterine bleeding    Allergies  Allergen Reactions   Erythromycin Nausea And Vomiting   Flecainide Acetate     Postural hypotension   Penicillins     Outpatient Encounter Medications as of 06/26/2023  Medication Sig   acetaminophen (TYLENOL) 500 MG tablet Take 1,000 mg by mouth every 8 (eight) hours as needed.   alendronate (FOSAMAX) 70 MG tablet Take 1 tablet (70 mg total) by mouth every 7 (seven) days. Take with a full glass of water on an empty stomach. Sit upright for 30 min after administration   amiodarone (PACERONE) 100 MG tablet Take 100 mg by  mouth daily.   amLODipine (NORVASC) 2.5 MG tablet Take 2.5 mg by mouth daily.   Artificial Tear Solution (GENTEAL TEARS OP) Apply 1 drop to eye 2 (two) times daily.   cholecalciferol (VITAMIN D3) 25 MCG (1000 UNIT) tablet Take 1,000 Units by mouth daily.   diclofenac Sodium (VOLTAREN) 1 % GEL Apply topically 2 (two) times daily. May self apply to left knee/hip prn discomfort   diltiazem (CARDIZEM) 60 MG tablet Take 1 tablet (60 mg total) by mouth 3 (three) times daily as needed (For heart rate >110 bpm).   furosemide (LASIX) 20 MG tablet Take 1 tablet (20 mg total) by  mouth daily as needed (for increased lower leg swelling.).   hydrALAZINE (APRESOLINE) 10 MG tablet Take 10 mg by mouth as needed.   hydrALAZINE (APRESOLINE) 25 MG tablet Take 25 mg by mouth 2 (two) times daily.   ipratropium (ATROVENT) 0.03 % nasal spray Place 2 sprays into both nostrils every 12 (twelve) hours.   levalbuterol (XOPENEX) 0.63 MG/3ML nebulizer solution Take 0.63 mg by nebulization 2 (two) times daily as needed for wheezing or shortness of breath.   loratadine (CLARITIN) 10 MG tablet Take 1 tablet (10 mg total) by mouth daily.   METAMUCIL FIBER PO Take by mouth at bedtime.   Multiple Vitamins-Minerals (MULTIVITAMIN ADULTS 50+) TABS Take 1 tablet by mouth daily.   Multiple Vitamins-Minerals (PRESERVISION AREDS PO) Take 2 tablets by mouth daily.    Rivaroxaban (XARELTO) 15 MG TABS tablet Take 1 tablet (15 mg total) by mouth daily with supper.   rosuvastatin (CRESTOR) 10 MG tablet Take 1 tablet (10 mg total) by mouth daily.   Sod Fluoride-Potassium Nitrate (PREVIDENT 5000 ENAMEL PROTECT) 1.1-5 % GEL Place 1 Application onto teeth at bedtime.   losartan (COZAAR) 25 MG tablet Take 1 tablet (25 mg total) by mouth every evening.   sodium fluoride (FLUORISHIELD) 1.1 % GEL dental gel Place 1 Application onto teeth at bedtime. (Patient not taking: Reported on 06/26/2023)   No facility-administered encounter medications on file as of 06/26/2023.    Review of Systems:  Review of Systems  Constitutional:  Positive for unexpected weight change. Negative for activity change and appetite change.  HENT: Negative.    Respiratory:  Negative for cough and shortness of breath.   Cardiovascular:  Negative for leg swelling.  Gastrointestinal:  Negative for constipation.  Genitourinary: Negative.   Musculoskeletal:  Positive for gait problem. Negative for arthralgias and myalgias.  Skin: Negative.   Neurological:  Negative for dizziness and weakness.  Psychiatric/Behavioral:  Negative for  confusion, dysphoric mood and sleep disturbance.     Health Maintenance  Topic Date Due   COVID-19 Vaccine (8 - 2023-24 season) 07/31/2023   Medicare Annual Wellness (AWV)  12/25/2023   DTaP/Tdap/Td (3 - Td or Tdap) 07/14/2025   Pneumonia Vaccine 78+ Years old  Completed   INFLUENZA VACCINE  Completed   DEXA SCAN  Completed   Zoster Vaccines- Shingrix  Completed   HPV VACCINES  Aged Out    Physical Exam: Vitals:   06/26/23 1116  BP: 124/68  Pulse: 74  Resp: 17  Temp: 97.7 F (36.5 C)  TempSrc: Temporal  SpO2: 95%  Weight: 168 lb 12.8 oz (76.6 kg)  Height: 5\' 4"  (1.626 m)   Body mass index is 28.97 kg/m. Physical Exam Vitals reviewed.  Constitutional:      Appearance: Normal appearance.  HENT:     Head: Normocephalic.     Right Ear: Tympanic membrane  normal.     Left Ear: Tympanic membrane normal.     Ears:     Comments: Impacted wax in Right Ear    Nose: Nose normal.     Mouth/Throat:     Mouth: Mucous membranes are moist.     Pharynx: Oropharynx is clear.  Eyes:     Pupils: Pupils are equal, round, and reactive to light.  Cardiovascular:     Rate and Rhythm: Normal rate. Rhythm irregular.     Pulses: Normal pulses.     Heart sounds: Normal heart sounds. No murmur heard. Pulmonary:     Effort: Pulmonary effort is normal.     Breath sounds: Normal breath sounds.  Abdominal:     General: Abdomen is flat. Bowel sounds are normal.     Palpations: Abdomen is soft.  Musculoskeletal:        General: Swelling present.     Cervical back: Neck supple.  Skin:    General: Skin is warm.  Neurological:     General: No focal deficit present.     Mental Status: She is alert and oriented to person, place, and time.  Psychiatric:        Mood and Affect: Mood normal.        Thought Content: Thought content normal.     Labs reviewed: Basic Metabolic Panel: Recent Labs    10/17/22 0000 04/24/23 0000  NA 141 137  K 4.4 4.5  CL 107 103  CO2 22 21  BUN 22* 22*   CREATININE 0.9 0.6  CALCIUM 8.8 8.6*  TSH 3.52  --    Liver Function Tests: Recent Labs    04/24/23 0000  AST 23  ALT 14  ALKPHOS 69  ALBUMIN 3.9   No results for input(s): "LIPASE", "AMYLASE" in the last 8760 hours. No results for input(s): "AMMONIA" in the last 8760 hours. CBC: Recent Labs    04/24/23 0000  WBC 4.7  HGB 13.4  HCT 38  PLT 262   Lipid Panel: Recent Labs    04/24/23 0000  CHOL 160  HDL 56  LDLCALC 90  TRIG 70   No results found for: "HGBA1C"  Procedures since last visit: No results found.  Assessment/Plan 1. Essential hypertension Hydralazine, Norvasc and Hydralazine  2. Pure hypercholesterolemia statin  3. Persistent atrial fibrillation (HCC) Amiodarone and Xarelto Follows with Cardiology  4. Age-related osteoporosis without current pathological fracture Was started on Fosamax 01/13/2023  5. Mild cognitive impairment Doing well in [place]  6. Nonexudative age-related macular degeneration, unspecified laterality, unspecified stage Follows with Opthalmology  7 Weight gain Made referral to Nutritionist to review her portion sizes  8 Allergic Rhinitis Restarted her flonase for 3 weeks  Labs/tests ordered:  * No order type specified * Next appt:  06/26/2023

## 2023-07-24 ENCOUNTER — Encounter: Payer: Self-pay | Admitting: Orthopedic Surgery

## 2023-07-24 ENCOUNTER — Non-Acute Institutional Stay: Payer: Self-pay | Admitting: Orthopedic Surgery

## 2023-07-24 DIAGNOSIS — L03032 Cellulitis of left toe: Secondary | ICD-10-CM | POA: Diagnosis not present

## 2023-07-24 MED ORDER — DOXYCYCLINE HYCLATE 100 MG PO TABS: 100.0000 mg | ORAL_TABLET | Freq: Two times a day (BID) | ORAL | Status: AC

## 2023-07-24 MED ORDER — SACCHAROMYCES BOULARDII 250 MG PO CAPS: 250.0000 mg | ORAL_CAPSULE | Freq: Two times a day (BID) | ORAL | Status: AC

## 2023-07-24 NOTE — Progress Notes (Signed)
Location:  [facility] Room Number: 523/A Place of Service:  ALF [phone]) Provider:  Octavia Heir, NP   Mahlon Gammon, MD  Patient Care Team: Mahlon Gammon, MD as PCP - General (Internal Medicine) [facility] Kato, Fredrik Cove, NP as Nurse Practitioner (Geriatric Medicine) Yates Decamp, MD as Consulting Physician (Cardiology) Teresa Coombs, MD as Consulting Physician (Ophthalmology)  Extended Emergency Contact Information Primary Emergency Contact: Dot Lanes of [place] Home Phone: [phone] Work Phone: [phone] Relation: Son Secondary Emergency Contact: [facility] Mobile Phone: [phone] Relation: Friend  Code Status:  DNR Goals of care: Advanced Directive information    06/26/2023    1:12 PM  Advanced Directives  Does Patient Have a Medical Advance Directive? Yes  Type of Advance Directive Living will;Out of facility DNR (pink MOST or yellow form)  Does patient want to make changes to medical advance directive? No - Patient declined     Chief Complaint  Patient presents with   Acute Visit    Left great toe infection    HPI:  Pt is a 89 y.o. female seen today for acute visit due to left great toe infection.   She currently resides on the assisted living unit at [facility]. PMH: atrial fibrillation, HTN, HLD, left carotid stenosis, venous insufficiency, gout arthritis, osteoporosis, cognitive impairment, and insomnia.    Nursing reports increased left great toe tenderness, redness and bleeding this morning. She had pedicure on [facility] last week. H/o thick toenails. Afebrile. Vitals stable.     Past Medical History:  Diagnosis Date   Actinic keratosis 09/23/2012   Anal fissure and fistula 07/28/2008   Arthralgia of temporomandibular joint 12/30/2009   Atrial fibrillation (HCC)    Atrial tachycardia (HCC)    s/p ablation 2008. [facility] (R free wall Atach)   Atrophy of vulva 09/23/2012    Blepharochalasis 03/01/2011   Cardiac dysrhythmia, unspecified 11/10/2009   Dermatophytosis of nail 02/28/2008   Dizziness and giddiness 04/03/2007   Dysphagia, unspecified(787.20) 01/17/2012   Edema 11/21/2006   External hemorrhoids without mention of complication 07/28/2008   HTN (hypertension)    Hyperlipidemia    Insomnia, unspecified 11/18/2004   Internal hemorrhoids without mention of complication 02/28/2008   Left bundle branch hemiblock    Loss of weight 03/05/2012   Myalgia and myositis, unspecified 01/05/2009   Orthostatic hypotension 03/22/2007   Other malaise and fatigue 03/05/2012   Pain in joint, site unspecified 02/28/2008   Palpitations 11/21/2006   Personality change due to conditions classified elsewhere    Pterygium eye, right 06/07/2020   Rectocele 09/23/2012   Restless legs syndrome (RLS)    Rosacea 09/23/2012   Sebaceous cyst 03/01/2011   Thumb pain 11/20/2012   Unspecified hereditary and idiopathic peripheral neuropathy    Past Surgical History:  Procedure Laterality Date   BLADDER SUSPENSION  1998   CATARACT EXTRACTION W/ INTRAOCULAR LENS IMPLANT Right 4/8//2008   Dr. Emmit Pomfret   catheter ablation  2008   for atrial tachycardia   CYST EXCISION  1998   mucus cyst removal from finger   PTERYGIUM EXCISION  06/07/2020   TOTAL ABDOMINAL HYSTERECTOMY  2001   uterine bleeding    Allergies  Allergen Reactions   Erythromycin Nausea And Vomiting   Flecainide Acetate     Postural hypotension   Penicillins     Outpatient Encounter Medications as of 07/24/2023  Medication Sig   acetaminophen (TYLENOL) 500 MG tablet Take 1,000 mg by mouth every  8 (eight) hours as needed.   alendronate (FOSAMAX) 70 MG tablet Take 1 tablet (70 mg total) by mouth every 7 (seven) days. Take with a full glass of water on an empty stomach. Sit upright for 30 min after administration   amiodarone (PACERONE) 100 MG tablet Take 100 mg by mouth daily.   amLODipine (NORVASC) 2.5 MG tablet Take 2.5 mg by  mouth daily.   Artificial Tear Solution (GENTEAL TEARS OP) Apply 1 drop to eye 2 (two) times daily.   cholecalciferol (VITAMIN D3) 25 MCG (1000 UNIT) tablet Take 1,000 Units by mouth daily.   diclofenac Sodium (VOLTAREN) 1 % GEL Apply topically 2 (two) times daily. May self apply to left knee/hip prn discomfort   diltiazem (CARDIZEM) 60 MG tablet Take 1 tablet (60 mg total) by mouth 3 (three) times daily as needed (For heart rate >110 bpm).   furosemide (LASIX) 20 MG tablet Take 1 tablet (20 mg total) by mouth daily as needed (for increased lower leg swelling.).   hydrALAZINE (APRESOLINE) 10 MG tablet Take 10 mg by mouth as needed.   hydrALAZINE (APRESOLINE) 25 MG tablet Take 25 mg by mouth 2 (two) times daily.   ipratropium (ATROVENT) 0.03 % nasal spray Place 2 sprays into both nostrils every 12 (twelve) hours.   levalbuterol (XOPENEX) 0.63 MG/3ML nebulizer solution Take 0.63 mg by nebulization 2 (two) times daily as needed for wheezing or shortness of breath.   loratadine (CLARITIN) 10 MG tablet Take 1 tablet (10 mg total) by mouth daily.   losartan (COZAAR) 25 MG tablet Take 1 tablet (25 mg total) by mouth every evening.   METAMUCIL FIBER PO Take by mouth at bedtime.   Multiple Vitamins-Minerals (MULTIVITAMIN ADULTS 50+) TABS Take 1 tablet by mouth daily.   Multiple Vitamins-Minerals (PRESERVISION AREDS PO) Take 2 tablets by mouth daily.    Rivaroxaban (XARELTO) 15 MG TABS tablet Take 1 tablet (15 mg total) by mouth daily with supper.   rosuvastatin (CRESTOR) 10 MG tablet Take 1 tablet (10 mg total) by mouth daily.   Sod Fluoride-Potassium Nitrate (PREVIDENT 5000 ENAMEL PROTECT) 1.1-5 % GEL Place 1 Application onto teeth at bedtime.   No facility-administered encounter medications on file as of 07/24/2023.    Review of Systems  Constitutional:  Negative for activity change.  Respiratory:  Negative for shortness of breath.   Cardiovascular:  Negative for chest pain.  Skin:  Positive for  color change and wound.  Psychiatric/Behavioral:  Negative for dysphoric mood. The patient is not nervous/anxious.     Immunization History  Administered Date(s) Administered   Fluad Quad(high Dose 65+) 05/19/2022, 06/05/2023   Influenza Whole 06/12/2012   Influenza, High Dose Seasonal PF 05/23/2019, 06/11/2020   Influenza,inj,Quad PF,6+ Mos 05/13/2013, 06/07/2018   Influenza-Unspecified 05/27/2014, 04/24/2015, 04/13/2016, 04/24/2017, 06/11/2020, 06/11/2021   Moderna Covid-19 Fall Seasonal Vaccine 55yrs & older 12/08/2022   Moderna Covid-19 Vaccine Bivalent Booster 68yrs & up 06/21/2021, 06/05/2023   Moderna SARS-COV2 Booster Vaccination 04/18/2021   Moderna Sars-Covid-2 Vaccination 08/26/2019, 09/23/2019, 06/29/2020, 06/14/2022   Pneumococcal Conjugate-13 05/27/2014   Pneumococcal Polysaccharide-23 08/14/1997   RSV,unspecified 08/09/2022   Td 04/15/2011   Tdap 07/15/2015   Zoster Recombinant(Shingrix) 02/12/2017, 06/28/2017   Zoster, Live 08/15/2007   Pertinent  Health Maintenance Due  Topic Date Due   INFLUENZA VACCINE  Completed   DEXA SCAN  Completed      10/02/2022    9:39 AM 12/19/2022    1:46 PM 12/25/2022    1:05 PM 03/26/2023  1:00 PM 06/26/2023    1:08 PM  Fall Risk  Falls in the past year? 0 0 0 0 0  Was there an injury with Fall? 0 0 0 0 0  Fall Risk Category Calculator 0 0 0 0 0  Patient at Risk for Falls Due to  No Fall Risks No Fall Risks History of fall(s);Impaired balance/gait;Impaired mobility Impaired mobility;Impaired balance/gait  Fall risk Follow up Falls evaluation completed Falls evaluation completed Falls evaluation completed Falls evaluation completed Falls evaluation completed   Functional Status Survey:    Vitals:   07/24/23 1352  BP: 138/70  Pulse: 61  Resp: 16  Temp: (!) 97.2 F (36.2 C)  SpO2: 93%  Weight: 164 lb 12.8 oz (74.8 kg)  Height: 5\' 4"  (1.626 m)   Body mass index is 28.29 kg/m. Physical Exam Vitals reviewed.   Constitutional:      General: She is not in acute distress. HENT:     Head: Normocephalic.  Eyes:     General:        Right eye: No discharge.        Left eye: No discharge.  Cardiovascular:     Rate and Rhythm: Normal rate and regular rhythm.     Pulses: Normal pulses.     Heart sounds: Normal heart sounds.  Pulmonary:     Effort: Pulmonary effort is normal.     Breath sounds: Normal breath sounds.  Abdominal:     Palpations: Abdomen is soft.  Skin:    General: Skin is warm.     Comments: Left great toe with increased tenderness, swelling and erythema> especially right side of nail bed, dried blood over nail bed.   Neurological:     General: No focal deficit present.     Mental Status: She is alert and oriented to person, place, and time.  Psychiatric:        Mood and Affect: Mood normal.     Labs reviewed: Recent Labs    10/17/22 0000 04/24/23 0000  NA 141 137  K 4.4 4.5  CL 107 103  CO2 22 21  BUN 22* 22*  CREATININE 0.9 0.6  CALCIUM 8.8 8.6*   Recent Labs    04/24/23 0000  AST 23  ALT 14  ALKPHOS 69  ALBUMIN 3.9   Recent Labs    04/24/23 0000  WBC 4.7  HGB 13.4  HCT 38  PLT 262   Lab Results  Component Value Date   TSH 3.52 10/17/2022   No results found for: "HGBA1C" Lab Results  Component Value Date   CHOL 160 04/24/2023   HDL 56 04/24/2023   LDLCALC 90 04/24/2023   TRIG 70 04/24/2023    Significant Diagnostic Results in last 30 days:  No results found.  Assessment/Plan 1. Cellulitis of great toe, left - increased erythema, swelling, tenderness to left toe - pedicure done last week> suspect as cause - start doxycycline for cellulitis - doxycycline (VIBRA-TABS) 100 MG tablet; Take 1 tablet (100 mg total) by mouth 2 (two) times daily for 10 days. - saccharomyces boulardii (FLORASTOR) 250 MG capsule; Take 1 capsule (250 mg total) by mouth 2 (two) times daily for 10 days.    Family/ staff Communication: plan discussed with patient  and nurse  Labs/tests ordered:  none

## 2023-07-31 DIAGNOSIS — H43813 Vitreous degeneration, bilateral: Secondary | ICD-10-CM | POA: Diagnosis not present

## 2023-07-31 DIAGNOSIS — H04123 Dry eye syndrome of bilateral lacrimal glands: Secondary | ICD-10-CM | POA: Diagnosis not present

## 2023-07-31 DIAGNOSIS — H353231 Exudative age-related macular degeneration, bilateral, with active choroidal neovascularization: Secondary | ICD-10-CM | POA: Diagnosis not present

## 2023-07-31 DIAGNOSIS — H43393 Other vitreous opacities, bilateral: Secondary | ICD-10-CM | POA: Diagnosis not present

## 2023-08-01 DIAGNOSIS — L570 Actinic keratosis: Secondary | ICD-10-CM | POA: Diagnosis not present

## 2023-08-01 DIAGNOSIS — L853 Xerosis cutis: Secondary | ICD-10-CM | POA: Diagnosis not present

## 2023-08-16 DIAGNOSIS — M79672 Pain in left foot: Secondary | ICD-10-CM | POA: Diagnosis not present

## 2023-08-16 DIAGNOSIS — M79671 Pain in right foot: Secondary | ICD-10-CM | POA: Diagnosis not present

## 2023-08-16 DIAGNOSIS — B351 Tinea unguium: Secondary | ICD-10-CM | POA: Diagnosis not present

## 2023-09-10 DIAGNOSIS — H52203 Unspecified astigmatism, bilateral: Secondary | ICD-10-CM | POA: Diagnosis not present

## 2023-09-10 DIAGNOSIS — Z961 Presence of intraocular lens: Secondary | ICD-10-CM | POA: Diagnosis not present

## 2023-09-10 DIAGNOSIS — H5000 Unspecified esotropia: Secondary | ICD-10-CM | POA: Diagnosis not present

## 2023-09-20 DIAGNOSIS — I480 Paroxysmal atrial fibrillation: Secondary | ICD-10-CM | POA: Diagnosis not present

## 2023-09-20 DIAGNOSIS — D649 Anemia, unspecified: Secondary | ICD-10-CM | POA: Diagnosis not present

## 2023-09-20 DIAGNOSIS — R799 Abnormal finding of blood chemistry, unspecified: Secondary | ICD-10-CM | POA: Diagnosis not present

## 2023-09-20 LAB — CBC AND DIFFERENTIAL
HCT: 40 (ref 36–46)
Hemoglobin: 13.5 (ref 12.0–16.0)
Platelets: 241 10*3/uL (ref 150–400)
WBC: 4

## 2023-09-20 LAB — BASIC METABOLIC PANEL
BUN: 21 (ref 4–21)
CO2: 22 (ref 13–22)
Chloride: 103 (ref 99–108)
Creatinine: 0.8 (ref 0.5–1.1)
Glucose: 99
Potassium: 4.1 meq/L (ref 3.5–5.1)
Sodium: 136 — AB (ref 137–147)

## 2023-09-20 LAB — COMPREHENSIVE METABOLIC PANEL
Albumin: 3.9 (ref 3.5–5.0)
Calcium: 8.4 — AB (ref 8.7–10.7)

## 2023-09-20 LAB — HEPATIC FUNCTION PANEL
ALT: 15 U/L (ref 7–35)
AST: 22 (ref 13–35)
Alkaline Phosphatase: 54 (ref 25–125)

## 2023-09-20 LAB — CBC: RBC: 4.13 (ref 3.87–5.11)

## 2023-09-20 LAB — TSH: TSH: 3.6 (ref 0.41–5.90)

## 2023-09-24 ENCOUNTER — Encounter: Payer: Self-pay | Admitting: Adult Health

## 2023-09-24 ENCOUNTER — Non-Acute Institutional Stay: Payer: Medicare PPO | Admitting: Adult Health

## 2023-09-24 VITALS — BP 138/84 | HR 76 | Temp 97.3°F | Ht 64.0 in | Wt 167.2 lb

## 2023-09-24 DIAGNOSIS — M25512 Pain in left shoulder: Secondary | ICD-10-CM

## 2023-09-24 MED ORDER — ACETAMINOPHEN 500 MG PO TABS: 1000.0000 mg | ORAL_TABLET | Freq: Two times a day (BID) | ORAL | Status: DC

## 2023-09-24 MED ORDER — PREDNISONE 20 MG PO TABS: 20.0000 mg | ORAL_TABLET | Freq: Every day | ORAL | 0 refills | Status: AC

## 2023-09-24 NOTE — Progress Notes (Signed)
Location:  [facility]  EAV:[id number]  Provider:  Peggye Ley, ANP [facility] [phone]   Code Status: DNR Goals of Care:     09/24/2023    3:06 PM  Advanced Directives  Does Patient Have a Medical Advance Directive? Yes  Type of Advance Directive Living will;Out of facility DNR (pink MOST or yellow form);Healthcare Power of [employer] of Healthcare Power of Attorney in Chart? Yes - validated most recent copy scanned in chart (See row information)     Chief Complaint  Patient presents with   Medical Management of Chronic Issues    Patient is being seen for a 3 month follow up and discuss left arm pain.    HPI: Patient is a 89 y.o. female seen today for medical management of chronic diseases.    OP: bone density study on 01/02/23 which showed a T score of -2.7 indicating osteoporosis. Started on Fosamax May 2024 Denies any indigestion or trouble swallowing  MCI:  MMSE 28/30 passed clock 08/17/22  Currently living in [facility] and doing well  Gets up 3-4 times night to urinate otherwise sleeping well.   Afib: follows with Dr Jacinto Halim Weighing herself daily  with no acute changes No swelling, wears compression hose  Bilateral carotid stenosis asymptomatic last carotid duplex per Dr Jacinto Halim 2022  HTN controlled  HLD Lab Results  Component Value Date   LDLCALC 90 04/24/2023    Has wet AMD followed with ophthalmology   Discussed the use of AI scribe software for clinical note transcription with the patient, who gave verbal consent to proceed.  History of Present Illness   Rebekah Graham is a 89 year old female and resides in AL at [facility].  She experiences severe pain in her left upper arm and shoulder, which worsens with movements such as abduction. The pain is particularly intense during sleep or when getting out of bed or a chair, although it does not hurt when she lies on it. She has been using Voltaren once nightly and Tylenol for pain  relief, but both have provided minimal relief. There is no history of falls or injuries to the arm. She also reports a raspy voice and tenderness in the left shoulder area.  She engages in exercises six days a week and does not attribute these activities to her shoulder pain, as she has been performing them for a long time. Due to the pain, she uses a lighter weight with her left arm compared to her right arm.  Her past medical history includes atrial fibrillation, managed with Xarelto and amiodarone, and hypertension, managed with Norvasc, hydralazine, and losartan. She also takes Fosamax for osteoporosis, started in June 2024, and Crestor for cholesterol management. She has not experienced any recent palpitations. No reflux or epistaxis.  She resides in assisted living and is independent with all activities of daily living, including dressing and bathing. She has a history of short-term memory issues but manages by writing things down.       Past Medical History:  Diagnosis Date   Actinic keratosis 09/23/2012   Anal fissure and fistula 07/28/2008   Arthralgia of temporomandibular joint 12/30/2009   Atrial fibrillation (HCC)    Atrial tachycardia (HCC)    s/p ablation 2008. [facility] (R free wall Atach)   Atrophy of vulva 09/23/2012   Blepharochalasis 03/01/2011   Cardiac dysrhythmia, unspecified 11/10/2009   Dermatophytosis of nail 02/28/2008   Dizziness and giddiness 04/03/2007   Dysphagia, unspecified(787.20) 01/17/2012   Edema  11/21/2006   External hemorrhoids without mention of complication 07/28/2008   HTN (hypertension)    Hyperlipidemia    Insomnia, unspecified 11/18/2004   Internal hemorrhoids without mention of complication 02/28/2008   Left bundle branch hemiblock    Loss of weight 03/05/2012   Myalgia and myositis, unspecified 01/05/2009   Orthostatic hypotension 03/22/2007   Other malaise and fatigue 03/05/2012   Pain in joint, site unspecified 02/28/2008   Palpitations 11/21/2006    Personality change due to conditions classified elsewhere    Pterygium eye, right 06/07/2020   Rectocele 09/23/2012   Restless legs syndrome (RLS)    Rosacea 09/23/2012   Sebaceous cyst 03/01/2011   Thumb pain 11/20/2012   Unspecified hereditary and idiopathic peripheral neuropathy     Past Surgical History:  Procedure Laterality Date   BLADDER SUSPENSION  1998   CATARACT EXTRACTION W/ INTRAOCULAR LENS IMPLANT Right 4/8//2008   Dr. Emmit Pomfret   catheter ablation  2008   for atrial tachycardia   CYST EXCISION  1998   mucus cyst removal from finger   PTERYGIUM EXCISION  06/07/2020   TOTAL ABDOMINAL HYSTERECTOMY  2001   uterine bleeding    Allergies  Allergen Reactions   Erythromycin Nausea And Vomiting   Flecainide Acetate     Postural hypotension   Penicillins     Outpatient Encounter Medications as of 09/24/2023  Medication Sig   acetaminophen (TYLENOL) 500 MG tablet Take 1,000 mg by mouth every 8 (eight) hours as needed.   acetaminophen (TYLENOL) 500 MG tablet Take 2 tablets (1,000 mg total) by mouth in the morning and at bedtime.   alendronate (FOSAMAX) 70 MG tablet Take 1 tablet (70 mg total) by mouth every 7 (seven) days. Take with a full glass of water on an empty stomach. Sit upright for 30 min after administration   amiodarone (PACERONE) 100 MG tablet Take 100 mg by mouth daily.   amLODipine (NORVASC) 2.5 MG tablet Take 2.5 mg by mouth daily.   Artificial Tear Solution (GENTEAL TEARS OP) Apply 1 drop to eye 2 (two) times daily.   cholecalciferol (VITAMIN D3) 25 MCG (1000 UNIT) tablet Take 1,000 Units by mouth daily.   diclofenac Sodium (VOLTAREN) 1 % GEL Apply topically 2 (two) times daily. May self apply to left knee/hip prn discomfort   diltiazem (CARDIZEM) 60 MG tablet Take 1 tablet (60 mg total) by mouth 3 (three) times daily as needed (For heart rate >110 bpm).   furosemide (LASIX) 20 MG tablet Take 1 tablet (20 mg total) by mouth daily as needed (for increased lower leg  swelling.).   hydrALAZINE (APRESOLINE) 10 MG tablet Take 10 mg by mouth as needed.   hydrALAZINE (APRESOLINE) 25 MG tablet Take 25 mg by mouth 2 (two) times daily.   ipratropium (ATROVENT) 0.03 % nasal spray Place 2 sprays into both nostrils every 12 (twelve) hours.   levalbuterol (XOPENEX) 0.63 MG/3ML nebulizer solution Take 0.63 mg by nebulization 2 (two) times daily as needed for wheezing or shortness of breath.   loratadine (CLARITIN) 10 MG tablet Take 1 tablet (10 mg total) by mouth daily.   METAMUCIL FIBER PO Take by mouth at bedtime.   Multiple Vitamins-Minerals (MULTIVITAMIN ADULTS 50+) TABS Take 1 tablet by mouth daily.   Multiple Vitamins-Minerals (PRESERVISION AREDS PO) Take 2 tablets by mouth daily.    predniSONE (DELTASONE) 20 MG tablet Take 1 tablet (20 mg total) by mouth daily with breakfast for 5 days.   Rivaroxaban (XARELTO) 15 MG TABS  tablet Take 1 tablet (15 mg total) by mouth daily with supper.   rosuvastatin (CRESTOR) 10 MG tablet Take 1 tablet (10 mg total) by mouth daily.   Sod Fluoride-Potassium Nitrate (PREVIDENT 5000 ENAMEL PROTECT) 1.1-5 % GEL Place 1 Application onto teeth at bedtime.   losartan (COZAAR) 25 MG tablet Take 1 tablet (25 mg total) by mouth every evening.   No facility-administered encounter medications on file as of 09/24/2023.    Review of Systems:  Review of Systems  Constitutional:  Negative for activity change, appetite change, chills, diaphoresis, fatigue, fever and unexpected weight change.  HENT:  Positive for hearing loss and voice change. Negative for congestion, ear discharge, ear pain, rhinorrhea, sore throat and trouble swallowing.   Respiratory:  Negative for cough, shortness of breath and wheezing.   Cardiovascular:  Positive for leg swelling. Negative for chest pain and palpitations.  Gastrointestinal:  Negative for abdominal distention, abdominal pain, constipation and diarrhea.  Genitourinary:  Negative for difficulty urinating and  dysuria.  Musculoskeletal:  Positive for gait problem. Negative for arthralgias, back pain, joint swelling and myalgias.       Left shoulder and upper arm  Neurological:  Negative for dizziness, tremors, seizures, syncope, facial asymmetry, speech difficulty, weakness, light-headedness, numbness and headaches.  Psychiatric/Behavioral:  Negative for agitation, behavioral problems and confusion.        Memory loss    Health Maintenance  Topic Date Due   COVID-19 Vaccine (8 - 2024-25 season) 10/10/2023 (Originally 07/31/2023)   Medicare Annual Wellness (AWV)  12/25/2023   DTaP/Tdap/Td (3 - Td or Tdap) 07/14/2025   Pneumonia Vaccine 54+ Years old  Completed   INFLUENZA VACCINE  Completed   DEXA SCAN  Completed   Zoster Vaccines- Shingrix  Completed   HPV VACCINES  Aged Out    Physical Exam: Vitals:   09/24/23 1502  BP: 138/84  Pulse: 76  Temp: (!) 97.3 F (36.3 C)  SpO2: 92%  Weight: 167 lb 3.2 oz (75.8 kg)  Height: 5\' 4"  (1.626 m)    Body mass index is 28.7 kg/m. Physical Exam Vitals and nursing note reviewed.  Constitutional:      General: She is not in acute distress.    Appearance: She is not diaphoretic.  HENT:     Head: Normocephalic and atraumatic.     Right Ear: Tympanic membrane, ear canal and external ear normal.     Left Ear: Tympanic membrane, ear canal and external ear normal.     Nose: No congestion or rhinorrhea.     Mouth/Throat:     Mouth: Mucous membranes are moist.     Pharynx: Oropharynx is clear.  Eyes:     Conjunctiva/sclera: Conjunctivae normal.     Pupils: Pupils are equal, round, and reactive to light.     Comments: Dry eyes with erythema to sclera  Neck:     Vascular: No JVD.  Cardiovascular:     Rate and Rhythm: Normal rate and regular rhythm.     Heart sounds: No murmur heard. Pulmonary:     Effort: Pulmonary effort is normal. No respiratory distress.     Breath sounds: Normal breath sounds. No wheezing.  Abdominal:     General:  Abdomen is flat. Bowel sounds are normal. There is no distension.     Palpations: Abdomen is soft.     Tenderness: There is no abdominal tenderness.  Musculoskeletal:     Left shoulder: No deformity, effusion, laceration, tenderness, bony tenderness or crepitus. Decreased  range of motion. Normal strength. Normal pulse.     Cervical back: No rigidity or tenderness.     Comments: Trace bilateral ankle edema Neg empty can test.    Lymphadenopathy:     Cervical: No cervical adenopathy.  Skin:    General: Skin is warm and dry.  Neurological:     Mental Status: She is alert and oriented to person, place, and time.  Psychiatric:        Mood and Affect: Mood normal.     Labs reviewed: Basic Metabolic Panel: Recent Labs    10/17/22 0000 04/24/23 0000 09/20/23 0000  NA 141 137 136*  K 4.4 4.5 4.1  CL 107 103 103  CO2 22 21 22   BUN 22* 22* 21  CREATININE 0.9 0.6 0.8  CALCIUM 8.8 8.6* 8.4*  TSH 3.52  --  3.60   Liver Function Tests: Recent Labs    04/24/23 0000 09/20/23 0000  AST 23 22  ALT 14 15  ALKPHOS 69 54  ALBUMIN 3.9 3.9   No results for input(s): "LIPASE", "AMYLASE" in the last 8760 hours. No results for input(s): "AMMONIA" in the last 8760 hours. CBC: Recent Labs    04/24/23 0000 09/20/23 0000  WBC 4.7 4.0  HGB 13.4 13.5  HCT 38 40  PLT 262 241   Lipid Panel: Recent Labs    04/24/23 0000  CHOL 160  HDL 56  LDLCALC 90  TRIG 70    No results found for: "HGBA1C"  Procedures since last visit: No results found.    Assessment and Plan    Left Shoulder Pain Pain localized to the left upper arm and shoulder, exacerbated by certain movements and positions. No relief with Voltaren or Tylenol. No recent trauma or injury. Possible inflammation.  -Start a short course of Prednisone to reduce inflammation. -Re-evaluate in 1 month.  Hypertension Blood pressure slightly elevated at 138/80. Patient is on Hydralazine and Losartan. -Continue current  medications. -Monitor blood pressure.  Atrial Fibrillation Patient is on Xarelto, Amiodarone, and has a medication for as-needed use if heart rate increases. No recent palpitations reported. -Continue current medications.  Osteoporosis Patient is on Fosamax, one pill every seven days since June 2024. -Continue Fosamax as prescribed.  Raspy Voice Recent onset, no reported reflux or other symptoms. -Monitor and follow-up in 1 month.  General Health Maintenance -Continue current medications including Norvasc for blood pressure and Crestor for cholesterol. -Annual memory check due soon. -Next cholesterol check due in September 2025.        Next appt:  3 months with Dr Chales Abrahams    Total time :  time greater than 50% of total time spent doing pt counseling and coordination of care

## 2023-09-25 ENCOUNTER — Encounter: Payer: Self-pay | Admitting: Adult Health

## 2023-09-25 DIAGNOSIS — H43393 Other vitreous opacities, bilateral: Secondary | ICD-10-CM | POA: Diagnosis not present

## 2023-09-25 DIAGNOSIS — H04123 Dry eye syndrome of bilateral lacrimal glands: Secondary | ICD-10-CM | POA: Diagnosis not present

## 2023-09-25 DIAGNOSIS — H353231 Exudative age-related macular degeneration, bilateral, with active choroidal neovascularization: Secondary | ICD-10-CM | POA: Diagnosis not present

## 2023-09-25 DIAGNOSIS — H43813 Vitreous degeneration, bilateral: Secondary | ICD-10-CM | POA: Diagnosis not present

## 2023-09-27 ENCOUNTER — Ambulatory Visit: Payer: Medicare PPO | Attending: Cardiology | Admitting: Cardiology

## 2023-09-27 ENCOUNTER — Encounter: Payer: Self-pay | Admitting: Cardiology

## 2023-09-27 VITALS — BP 130/78 | HR 70 | Resp 16 | Ht 64.0 in | Wt 167.2 lb

## 2023-09-27 DIAGNOSIS — I10 Essential (primary) hypertension: Secondary | ICD-10-CM | POA: Diagnosis not present

## 2023-09-27 DIAGNOSIS — I480 Paroxysmal atrial fibrillation: Secondary | ICD-10-CM

## 2023-09-27 NOTE — Patient Instructions (Addendum)
Medication Instructions:  Your physician recommends that you continue on your current medications as directed. Please refer to the Current Medication list given to you today.  *If you need a refill on your cardiac medications before your next appointment, please call your pharmacy*  Lab Work: None ordered.  If you have labs (blood work) drawn today and your tests are completely normal, you will receive your results only by: MyChart Message (if you have MyChart) OR A paper copy in the mail If you have any lab test that is abnormal or we need to change your treatment, we will call you to review the results.  Testing/Procedures: None ordered.  Follow-Up: At [facility], you and your health needs are our priority.  As part of our continuing mission to provide you with exceptional heart care, we have created designated Provider Care Teams.  These Care Teams include your primary Cardiologist (physician) and Advanced Practice Providers (APPs -  Physician Assistants and Nurse Practitioners) who all work together to provide you with the care you need, when you need it.  Your next appointment:   As needed  The format for your next appointment:   In Person  Provider:   Yates Decamp, MD  Important Information About Sugar

## 2023-09-27 NOTE — Progress Notes (Signed)
Cardiology Office Note:  .   Date:  09/27/2023  ID:  Wyatt Haste, DOB [birth year], MRN [id number] PCP: Mahlon Gammon, MD  [facility] Providers Cardiologist:  None   History of Present Illness: .   Rebekah Graham is a 89 y.o. Caucasian female with history of paroxysmal atrial fibrillation, hypertension and hyperlipidemia.  She is compliant with her medication.  She has no specific complaints.  States that she is tolerating all her medications well.  Labs   Lab Results  Component Value Date   CHOL 160 04/24/2023   HDL 56 04/24/2023   LDLCALC 90 04/24/2023   TRIG 70 04/24/2023   Lab Results  Component Value Date   NA 136 (A) 09/20/2023   K 4.1 09/20/2023   CO2 22 09/20/2023   GLUCOSE 119 (H) 07/20/2021   BUN 21 09/20/2023   CREATININE 0.8 09/20/2023   CALCIUM 8.4 (A) 09/20/2023   EGFR 67 04/24/2023      Latest Ref Rng & Units 09/20/2023   12:00 AM 04/24/2023   12:00 AM 10/17/2022   12:00 AM  BMP  BUN 4 - 21 21     22     22       Creatinine 0.5 - 1.1 0.8     0.6     0.9      Sodium 137 - 147 136     137     141      Potassium 3.5 - 5.1 mEq/L 4.1     4.5     4.4      Chloride 99 - 108 103     103     107      CO2 13 - 22 22     21     22       Calcium 8.7 - 10.7 8.4     8.6     8.8         This result is from an external source.      Latest Ref Rng & Units 09/20/2023   12:00 AM 04/24/2023   12:00 AM 06/10/2022   12:00 AM  CBC  WBC  4.0     4.7     4.5      Hemoglobin 12.0 - 16.0 13.5     13.4     14.4      Hematocrit 36 - 46 40     38     42      Platelets 150 - 400 K/uL 241     262     232         This result is from an external source.   Lab Results  Component Value Date   TSH 3.60 09/20/2023   Review of Systems  Cardiovascular:  Negative for chest pain, dyspnea on exertion and leg swelling.   Physical Exam:   VS:  BP 130/78 (BP Location: Left Arm, Patient Position: Sitting, Cuff Size: Normal)   Pulse 70   Resp 16   Ht 5\' 4"  (1.626 m)   Wt 167  lb 3.2 oz (75.8 kg)   SpO2 97%   BMI 28.70 kg/m    Wt Readings from Last 3 Encounters:  09/27/23 167 lb 3.2 oz (75.8 kg)  09/24/23 167 lb 3.2 oz (75.8 kg)  07/24/23 164 lb 12.8 oz (74.8 kg)    Physical Exam Neck:     Vascular: No carotid bruit or JVD.  Cardiovascular:     Rate and  Rhythm: Normal rate and regular rhythm.     Pulses: Intact distal pulses.     Heart sounds: Normal heart sounds. No murmur heard.    No gallop.  Pulmonary:     Effort: Pulmonary effort is normal.     Breath sounds: Normal breath sounds.  Abdominal:     General: Bowel sounds are normal.     Palpations: Abdomen is soft.  Musculoskeletal:     Right lower leg: No edema.     Left lower leg: No edema.    Studies Reviewed: .    NA EKG:    EKG Interpretation Date/Time:  Thursday September 27 2023 10:32:47 EST Ventricular Rate:  80 PR Interval:  170 QRS Duration:  94 QT Interval:  392 QTC Calculation: 452 R Axis:   51  Text Interpretation: EKG 09/27/2023: Normal sinus rhythm at rate of 80 bpm, normal axis.  Poor R wave progression, cannot exclude anterior infarct old.  No evidence of ischemia.  Normal QT interval.  No change from 03/27/2023. Confirmed by Delrae Rend [phone]) on 09/27/2023 10:41:32 AM    EKG 03/27/2023: Normal sinus rhythm at rate of 64 bpm, normal axis, incomplete right bundle branch block. Poor R progression, cannot exclude anteroseptal infarct old. PACs (2).   Medications and allergies    Allergies  Allergen Reactions   Erythromycin Nausea And Vomiting   Flecainide Acetate     Postural hypotension   Penicillins      Current Outpatient Medications:    acetaminophen (TYLENOL) 500 MG tablet, Take 1,000 mg by mouth every 8 (eight) hours as needed., Disp: , Rfl:    alendronate (FOSAMAX) 70 MG tablet, Take 1 tablet (70 mg total) by mouth every 7 (seven) days. Take with a full glass of water on an empty stomach. Sit upright for 30 min after administration, Disp: 4 tablet, Rfl:  11   amiodarone (PACERONE) 100 MG tablet, Take 100 mg by mouth daily., Disp: , Rfl:    amLODipine (NORVASC) 2.5 MG tablet, Take 2.5 mg by mouth daily., Disp: , Rfl:    Artificial Tear Solution (GENTEAL TEARS OP), Apply 1 drop to eye 2 (two) times daily., Disp: , Rfl:    cholecalciferol (VITAMIN D3) 25 MCG (1000 UNIT) tablet, Take 1,000 Units by mouth daily., Disp: , Rfl:    diclofenac Sodium (VOLTAREN) 1 % GEL, Apply topically 2 (two) times daily. May self apply to left knee/hip prn discomfort, Disp: , Rfl:    diltiazem (CARDIZEM) 60 MG tablet, Take 1 tablet (60 mg total) by mouth 3 (three) times daily as needed (For heart rate >110 bpm)., Disp: 30 tablet, Rfl: 3   furosemide (LASIX) 20 MG tablet, Take 1 tablet (20 mg total) by mouth daily as needed (for increased lower leg swelling.)., Disp: 60 tablet, Rfl: 0   hydrALAZINE (APRESOLINE) 10 MG tablet, Take 10 mg by mouth as needed., Disp: , Rfl:    ipratropium (ATROVENT) 0.03 % nasal spray, Place 2 sprays into both nostrils every 12 (twelve) hours., Disp: 30 mL, Rfl: 12   levalbuterol (XOPENEX) 0.63 MG/3ML nebulizer solution, Take 0.63 mg by nebulization 2 (two) times daily as needed for wheezing or shortness of breath., Disp: , Rfl:    loratadine (CLARITIN) 10 MG tablet, Take 1 tablet (10 mg total) by mouth daily., Disp: 30 tablet, Rfl: 11   losartan (COZAAR) 25 MG tablet, Take 1 tablet (25 mg total) by mouth every evening., Disp: 30 tablet, Rfl: 2   METAMUCIL FIBER PO, Take  by mouth at bedtime., Disp: , Rfl:    Multiple Vitamins-Minerals (MULTIVITAMIN ADULTS 50+) TABS, Take 1 tablet by mouth daily., Disp: , Rfl:    Multiple Vitamins-Minerals (PRESERVISION AREDS PO), Take 2 tablets by mouth daily. , Disp: , Rfl:    predniSONE (DELTASONE) 20 MG tablet, Take 1 tablet (20 mg total) by mouth daily with breakfast for 5 days., Disp: 5 tablet, Rfl: 0   Rivaroxaban (XARELTO) 15 MG TABS tablet, Take 1 tablet (15 mg total) by mouth daily with supper., Disp:  30 tablet, Rfl: 12   rosuvastatin (CRESTOR) 10 MG tablet, Take 1 tablet (10 mg total) by mouth daily., Disp: , Rfl:    Sod Fluoride-Potassium Nitrate (PREVIDENT 5000 ENAMEL PROTECT) 1.1-5 % GEL, Place 1 Application onto teeth at bedtime., Disp: , Rfl:    ASSESSMENT AND PLAN: .      ICD-10-CM   1. Paroxysmal atrial fibrillation (HCC)  I48.0 EKG 12-Lead    2. Essential hypertension  I10      1. Paroxysmal atrial fibrillation (HCC) (Primary) Patient is presently doing well, previously she used to have frequent episodes of atrial fibrillation and also frequent PACs.  Today the EKG reveals normal sinus rhythm.  She is tolerating amiodarone 100 mg daily and has maintained sinus rhythm.  She is also tolerating Xarelto 15 mg daily which is appropriate dose for her in view of renal insufficiency, with hemoglobin well-maintained. - EKG 12-Lead  2. Essential hypertension Blood pressure is well-controlled on losartan.  She was also prescribed diltiazem to be taken on a as needed basis 60 mg 3 times daily for palpitations, she has not had any further palpitations so can be taken out of her medication list.  I reviewed external labs, reassured her.  As she has remained stable over the past 2 to 3 years, I will see her back on a as needed basis.  I will request her PCP to continue the present medications including low-dose amiodarone.  Patient is DNR.   Signed,  Yates Decamp, MD, [facility] 09/27/2023, 11:01 AM [facility], [place] [address] [place], [name] [address] Phone: [phone]. Fax:  [phone]

## 2023-10-19 ENCOUNTER — Encounter: Payer: Self-pay | Admitting: Adult Health

## 2023-10-22 ENCOUNTER — Non-Acute Institutional Stay: Payer: Medicare PPO | Admitting: Adult Health

## 2023-10-22 ENCOUNTER — Encounter: Payer: Self-pay | Admitting: Adult Health

## 2023-10-22 VITALS — BP 118/60 | HR 58 | Temp 98.2°F | Resp 20 | Ht 64.0 in | Wt 165.6 lb

## 2023-10-22 DIAGNOSIS — Z6828 Body mass index (BMI) 28.0-28.9, adult: Secondary | ICD-10-CM

## 2023-10-22 DIAGNOSIS — M25512 Pain in left shoulder: Secondary | ICD-10-CM | POA: Diagnosis not present

## 2023-10-22 NOTE — Progress Notes (Signed)
 Location:  [facility]  POS: clinic  Provider:  Peggye Ley, ANP [facility] [phone]   Code Status: DNR Goals of Care:     10/22/2023    1:11 PM  Advanced Directives  Does Patient Have a Medical Advance Directive? Yes  Type of Estate agent of [place];Living will;Out of facility DNR (pink MOST or yellow form)  Does patient want to make changes to medical advance directive? No - Patient declined  Copy of Healthcare Power of Attorney in Chart? Yes - validated most recent copy scanned in chart (See row information)     Chief Complaint  Patient presents with   Medical Management of Chronic Issues    Follow up shoulder visit.    HPI: Patient is a 89 y.o. [place] seen today for follow up regarding left shoulder pain The patient presents with left shoulder pain.  She was previously prescribed prednisone on February 10th to decrease inflammation related to left shoulder pain with rotator cuff issues. The shoulder pain which is improved but does persist. She uses lighter weights on the left arm when compared with the right when working out.  She describes the pain as 'a little off' but notes it has not worsened since last month. The pain does not disturb her sleep, and she continues to participate in exercise classes without significant issues. She takes Tylenol daily for discomfort.  She monitors her weight daily and maintains a diet of three small meals a day, avoiding desserts and starches. Her weight fluctuates slightly between 165 and 168 pounds. She wants to weigh less but acknowledges her weight remains stable.  She mentions experiencing stress recently due to her son having a stroke last week. Her son, who is 64 years old, is recovering well with only minor short-term memory issues remaining. He is an Academic librarian and runs a rock climbing book business.  No swelling in her legs, shortness of breath when walking or lying flat, or waking up short  of breath at night. She sleeps with her upper body elevated. TSH Feb 3.6 Past Medical History:  Diagnosis Date   Actinic keratosis 09/23/2012   Anal fissure and fistula 07/28/2008   Arthralgia of temporomandibular joint 12/30/2009   Atrial fibrillation (HCC)    Atrial tachycardia (HCC)    s/p ablation 2008. [facility] (R free wall Atach)   Atrophy of vulva 09/23/2012   Blepharochalasis 03/01/2011   Cardiac dysrhythmia, unspecified 11/10/2009   Dermatophytosis of nail 02/28/2008   Dizziness and giddiness 04/03/2007   Dysphagia, unspecified(787.20) 01/17/2012   Edema 11/21/2006   External hemorrhoids without mention of complication 07/28/2008   HTN (hypertension)    Hyperlipidemia    Insomnia, unspecified 11/18/2004   Internal hemorrhoids without mention of complication 02/28/2008   Left bundle branch hemiblock    Loss of weight 03/05/2012   Myalgia and myositis, unspecified 01/05/2009   Orthostatic hypotension 03/22/2007   Other malaise and fatigue 03/05/2012   Pain in joint, site unspecified 02/28/2008   Palpitations 11/21/2006   Personality change due to conditions classified elsewhere    Pterygium eye, right 06/07/2020   Rectocele 09/23/2012   Restless legs syndrome (RLS)    Rosacea 09/23/2012   Sebaceous cyst 03/01/2011   Thumb pain 11/20/2012   Unspecified hereditary and idiopathic peripheral neuropathy     Past Surgical History:  Procedure Laterality Date   BLADDER SUSPENSION  1998   CATARACT EXTRACTION W/ INTRAOCULAR LENS IMPLANT Right 4/8//2008   Dr. Emmit Pomfret   catheter ablation  2008   for atrial tachycardia   CYST EXCISION  1998   mucus cyst removal from finger   PTERYGIUM EXCISION  06/07/2020   TOTAL ABDOMINAL HYSTERECTOMY  2001   uterine bleeding    Allergies  Allergen Reactions   Erythromycin Nausea And Vomiting   Flecainide Acetate     Postural hypotension   Penicillins     Outpatient Encounter Medications as of 10/22/2023  Medication Sig   acetaminophen (TYLENOL) 500 MG tablet  Take 1,000 mg by mouth every 8 (eight) hours as needed.   alendronate (FOSAMAX) 70 MG tablet Take 1 tablet (70 mg total) by mouth every 7 (seven) days. Take with a full glass of water on an empty stomach. Sit upright for 30 min after administration   amiodarone (PACERONE) 100 MG tablet Take 100 mg by mouth daily.   amLODipine (NORVASC) 2.5 MG tablet Take 2.5 mg by mouth daily.   Artificial Tear Solution (GENTEAL TEARS OP) Apply 1 drop to eye 2 (two) times daily.   cholecalciferol (VITAMIN D3) 25 MCG (1000 UNIT) tablet Take 1,000 Units by mouth daily.   diclofenac Sodium (VOLTAREN) 1 % GEL Apply topically 2 (two) times daily. May self apply to left knee/hip prn discomfort   diltiazem (CARDIZEM) 60 MG tablet Take 1 tablet (60 mg total) by mouth 3 (three) times daily as needed (For heart rate >110 bpm).   furosemide (LASIX) 20 MG tablet Take 1 tablet (20 mg total) by mouth daily as needed (for increased lower leg swelling.).   hydrALAZINE (APRESOLINE) 10 MG tablet Take 10 mg by mouth as needed.   ipratropium (ATROVENT) 0.03 % nasal spray Place 2 sprays into both nostrils every 12 (twelve) hours.   levalbuterol (XOPENEX) 0.63 MG/3ML nebulizer solution Take 0.63 mg by nebulization 2 (two) times daily as needed for wheezing or shortness of breath.   loratadine (CLARITIN) 10 MG tablet Take 1 tablet (10 mg total) by mouth daily.   METAMUCIL FIBER PO Take by mouth at bedtime.   Multiple Vitamins-Minerals (MULTIVITAMIN ADULTS 50+) TABS Take 1 tablet by mouth daily.   Multiple Vitamins-Minerals (PRESERVISION AREDS PO) Take 2 tablets by mouth daily.    Rivaroxaban (XARELTO) 15 MG TABS tablet Take 1 tablet (15 mg total) by mouth daily with supper.   rosuvastatin (CRESTOR) 10 MG tablet Take 1 tablet (10 mg total) by mouth daily.   Sod Fluoride-Potassium Nitrate (PREVIDENT 5000 ENAMEL PROTECT) 1.1-5 % GEL Place 1 Application onto teeth at bedtime.   losartan (COZAAR) 25 MG tablet Take 1 tablet (25 mg total) by  mouth every evening.   No facility-administered encounter medications on file as of 10/22/2023.    Review of Systems:  Review of Systems  Health Maintenance  Topic Date Due   COVID-19 Vaccine (8 - 2024-25 season) 07/31/2023   Medicare Annual Wellness (AWV)  12/25/2023   DTaP/Tdap/Td (3 - Td or Tdap) 07/14/2025   Pneumonia Vaccine 17+ Years old  Completed   INFLUENZA VACCINE  Completed   DEXA SCAN  Completed   Zoster Vaccines- Shingrix  Completed   HPV VACCINES  Aged Out    Physical Exam: Vitals:   10/19/23 1626  BP: 118/60  Pulse: (!) 58  Resp: 20  Temp: 98.2 F (36.8 C)  SpO2: 98%  Weight: 165 lb 9.6 oz (75.1 kg)  Height: 5\' 4"  (1.626 m)   Body mass index is Rebekah.43 kg/m. Physical Exam  Labs reviewed: Basic Metabolic Panel: Recent Labs    04/24/23 0000 09/20/23  0000  NA 137 136*  K 4.5 4.1  CL 103 103  CO2 21 22  BUN 22* 21  CREATININE 0.6 0.8  CALCIUM 8.6* 8.4*  TSH  --  3.60   Liver Function Tests: Recent Labs    04/24/23 0000 09/20/23 0000  AST 23 22  ALT 14 15  ALKPHOS 69 54  ALBUMIN 3.9 3.9   No results for input(s): "LIPASE", "AMYLASE" in the last 8760 hours. No results for input(s): "AMMONIA" in the last 8760 hours. CBC: Recent Labs    04/24/23 0000 09/20/23 0000  WBC 4.7 4.0  HGB 13.4 13.5  HCT 38 40  PLT 262 241   Lipid Panel: Recent Labs    04/24/23 0000  CHOL 160  HDL 56  LDLCALC 90  TRIG 70   No results found for: "HGBA1C"  Procedures since last visit: No results found.  Assessment/Plan  Left Shoulder Pain Likely rotator cuff pathology with mild discomfort and limited range of motion. No significant change in symptoms since last visit. No nocturnal pain. Patient is managing with Tylenol and exercise. -Continue Tylenol as needed for pain. -Continue exercise classes for mobility and strength. -Consider referral to orthopedist for possible injection if symptoms worsen.  Weight Management Patient is mindful of diet  and maintains regular physical activity. Weight has minor fluctuations but remains relatively stable. -Encourage continued mindful eating and regular exercise. -Reassure patient that current weight is appropriate for age and activity level.  General Health Maintenance Blood pressure is well controlled. Thyroid function (TSH 3.6) is within normal limits. -Continue current management strategies. -Schedule routine follow-up appointment.  Labs/tests ordered:  * No order type specified * CBC BMP  prior to next apt Next appt:  3 MONTHS WITH Dr Chales Abrahams

## 2023-10-25 DIAGNOSIS — L821 Other seborrheic keratosis: Secondary | ICD-10-CM | POA: Diagnosis not present

## 2023-10-25 DIAGNOSIS — L817 Pigmented purpuric dermatosis: Secondary | ICD-10-CM | POA: Diagnosis not present

## 2023-10-25 DIAGNOSIS — L570 Actinic keratosis: Secondary | ICD-10-CM | POA: Diagnosis not present

## 2023-10-25 DIAGNOSIS — Z85828 Personal history of other malignant neoplasm of skin: Secondary | ICD-10-CM | POA: Diagnosis not present

## 2023-10-25 DIAGNOSIS — D1801 Hemangioma of skin and subcutaneous tissue: Secondary | ICD-10-CM | POA: Diagnosis not present

## 2023-10-25 DIAGNOSIS — D0461 Carcinoma in situ of skin of right upper limb, including shoulder: Secondary | ICD-10-CM | POA: Diagnosis not present

## 2023-11-01 ENCOUNTER — Other Ambulatory Visit: Payer: Self-pay

## 2023-11-01 DIAGNOSIS — I10 Essential (primary) hypertension: Secondary | ICD-10-CM

## 2023-11-01 MED ORDER — LOSARTAN POTASSIUM 25 MG PO TABS: 25.0000 mg | ORAL_TABLET | Freq: Every evening | ORAL | 3 refills | Status: AC

## 2023-11-13 ENCOUNTER — Encounter: Payer: Self-pay | Admitting: Orthopedic Surgery

## 2023-11-13 ENCOUNTER — Non-Acute Institutional Stay: Payer: Self-pay | Admitting: Orthopedic Surgery

## 2023-11-13 DIAGNOSIS — J301 Allergic rhinitis due to pollen: Secondary | ICD-10-CM | POA: Diagnosis not present

## 2023-11-13 DIAGNOSIS — T7840XA Allergy, unspecified, initial encounter: Secondary | ICD-10-CM

## 2023-11-13 MED ORDER — FLUTICASONE PROPIONATE 50 MCG/ACT NA SUSP
1.0000 | Freq: Every day | NASAL | Status: DC
Start: 2023-11-13 — End: 2023-12-27

## 2023-11-13 MED ORDER — ALBUTEROL SULFATE HFA 108 (90 BASE) MCG/ACT IN AERS: 2.0000 | INHALATION_SPRAY | Freq: Four times a day (QID) | RESPIRATORY_TRACT | Status: AC | PRN

## 2023-11-13 NOTE — Progress Notes (Addendum)
 Location:  [facility] Room Number: 523/A Place of Service:  ALF [phone]) Provider:  Arnetha Bhat, NP   Marguerite Shiley, MD  Patient Care Team: Marguerite Shiley, MD as PCP - General (Internal Medicine) [facility], Azalea Bolder, NP as Nurse Practitioner (Geriatric Medicine) Knox Perl, MD as Consulting Physician (Cardiology) Alvan Jews, MD as Consulting Physician (Ophthalmology)  Extended Emergency Contact Information Primary Emergency Contact: Fader,Stephen  [place] Home Phone: [phone] Work Phone: [phone] Relation: Son Secondary Emergency Contact: [facility] Mobile Phone: [phone] Relation: Friend  Code Status:  DNR Goals of care: Advanced Directive information    10/22/2023    1:11 PM  Advanced Directives  Does Patient Have a Medical Advance Directive? Yes  Type of Estate agent of [place];Living will;Out of facility DNR (pink MOST or yellow form)  Does patient want to make changes to medical advance directive? No - Patient declined  Copy of Healthcare Power of Attorney in Chart? Yes - validated most recent copy scanned in chart (See row information)     Chief Complaint  Patient presents with   Acute Visit    Wheezing and nasal congestion    HPI:  Pt is a 89 y.o. female seen today for acute visit due to wheezing and nasal congestion.   Symptoms of sneezing, wheezing and increased nasal congestion began within past week. H/o seasonal allergies. She remains on Claritin  and Atrovent  nasal spray. Nasal drainage clear. Denies sinus/dental pain. Yesterday, she felt increased wheezing after being outside. She received one xopenex  nebulizer treatment and symptoms improved. She did not like nebulizer treatment. Requesting inhaler. Treatment options discussed, she is open to restarting Flonase . Afebrile. Vitals stable.    Past Medical History:  Diagnosis Date    Actinic keratosis 09/23/2012   Anal fissure and fistula 07/28/2008   Arthralgia of temporomandibular joint 12/30/2009   Atrial fibrillation (HCC)    Atrial tachycardia (HCC)    s/p ablation 2008. [facility] (R free wall Atach)   Atrophy of vulva 09/23/2012   Blepharochalasis 03/01/2011   Cardiac dysrhythmia, unspecified 11/10/2009   Dermatophytosis of nail 02/28/2008   Dizziness and giddiness 04/03/2007   Dysphagia, unspecified(787.20) 01/17/2012   Edema 11/21/2006   External hemorrhoids without mention of complication 07/28/2008   HTN (hypertension)    Hyperlipidemia    Insomnia, unspecified 11/18/2004   Internal hemorrhoids without mention of complication 02/28/2008   Left bundle branch hemiblock    Loss of weight 03/05/2012   Myalgia and myositis, unspecified 01/05/2009   Orthostatic hypotension 03/22/2007   Other malaise and fatigue 03/05/2012   Pain in joint, site unspecified 02/28/2008   Palpitations 11/21/2006   Personality change due to conditions classified elsewhere    Pterygium eye, right 06/07/2020   Rectocele 09/23/2012   Restless legs syndrome (RLS)    Rosacea 09/23/2012   Sebaceous cyst 03/01/2011   Thumb pain 11/20/2012   Unspecified hereditary and idiopathic peripheral neuropathy    Past Surgical History:  Procedure Laterality Date   BLADDER SUSPENSION  1998   CATARACT EXTRACTION W/ INTRAOCULAR LENS IMPLANT Right 4/8//2008   Dr. Neil Balls   catheter ablation  2008   for atrial tachycardia   CYST EXCISION  1998   mucus cyst removal from finger   PTERYGIUM EXCISION  06/07/2020   TOTAL ABDOMINAL HYSTERECTOMY  2001   uterine bleeding    Allergies  Allergen Reactions   Erythromycin Nausea And Vomiting   Flecainide Acetate  Postural hypotension   Penicillins     Outpatient Encounter Medications as of 11/13/2023  Medication Sig   acetaminophen  (TYLENOL ) 500 MG tablet Take 1,000 mg by mouth every 8 (eight) hours as needed.   acetaminophen  (TYLENOL ) 500 MG tablet Take 500 mg by mouth 2  (two) times daily. Tablet 500 mg amt: 1000 mg; oral twice a day: 8:00 am - 11:00 am , 8:00 pm - 10:00 pm   alendronate  (FOSAMAX ) 70 MG tablet Take 1 tablet (70 mg total) by mouth every 7 (seven) days. Take with a full glass of water on an empty stomach. Sit upright for 30 min after administration   amiodarone  (PACERONE ) 100 MG tablet Take 100 mg by mouth daily.   amLODipine  (NORVASC ) 2.5 MG tablet Take 2.5 mg by mouth daily.   Artificial Tear Solution (GENTEAL TEARS OP) Apply 1 drop to eye 2 (two) times daily.   cholecalciferol (VITAMIN D3) 25 MCG (1000 UNIT) tablet Take 1,000 Units by mouth daily.   diclofenac  Sodium (VOLTAREN ) 1 % GEL Apply topically 2 (two) times daily. May self apply to left knee/hip prn discomfort   diltiazem  (CARDIZEM ) 60 MG tablet Take 1 tablet (60 mg total) by mouth 3 (three) times daily as needed (For heart rate >110 bpm).   furosemide  (LASIX ) 20 MG tablet Take 1 tablet (20 mg total) by mouth daily as needed (for increased lower leg swelling.).   hydrALAZINE  (APRESOLINE ) 10 MG tablet Take 10 mg by mouth as needed.   ipratropium (ATROVENT ) 0.03 % nasal spray Place 2 sprays into both nostrils every 12 (twelve) hours.   levalbuterol  (XOPENEX ) 0.63 MG/3ML nebulizer solution Take 0.63 mg by nebulization 2 (two) times daily as needed for wheezing or shortness of breath.   loratadine  (CLARITIN ) 10 MG tablet Take 1 tablet (10 mg total) by mouth daily.   losartan  (COZAAR ) 25 MG tablet Take 1 tablet (25 mg total) by mouth every evening.   METAMUCIL FIBER PO Take by mouth at bedtime.   Multiple Vitamins-Minerals (MULTIVITAMIN ADULTS 50+) TABS Take 1 tablet by mouth daily.   Multiple Vitamins-Minerals (PRESERVISION AREDS PO) Take 2 tablets by mouth daily.    Rivaroxaban  (XARELTO ) 15 MG TABS tablet Take 1 tablet (15 mg total) by mouth daily with supper.   rosuvastatin  (CRESTOR ) 10 MG tablet Take 1 tablet (10 mg total) by mouth daily.   Sod Fluoride-Potassium Nitrate (PREVIDENT 5000  ENAMEL PROTECT) 1.1-5 % GEL Place 1 Application onto teeth at bedtime.   No facility-administered encounter medications on file as of 11/13/2023.    Review of Systems  Constitutional:  Negative for fatigue and fever.  HENT:  Positive for rhinorrhea and sneezing. Negative for sore throat and trouble swallowing.   Eyes:  Positive for itching. Negative for visual disturbance.  Respiratory:  Positive for wheezing. Negative for cough and shortness of breath.   Cardiovascular:  Negative for chest pain, palpitations and leg swelling.  Psychiatric/Behavioral:  Negative for dysphoric mood. The patient is not nervous/anxious.     Immunization History  Administered Date(s) Administered   Fluad Quad(high Dose 65+) 05/19/2022, 06/05/2023   Influenza Whole 06/12/2012   Influenza, High Dose Seasonal PF 05/23/2019, 06/11/2020   Influenza,inj,Quad PF,6+ Mos 05/13/2013, 06/07/2018   Influenza-Unspecified 05/27/2014, 04/24/2015, 04/13/2016, 04/24/2017, 06/11/2020, 06/11/2021   Moderna Covid-19 Fall Seasonal Vaccine 35yrs & older 12/08/2022   Moderna Covid-19 Vaccine Bivalent Booster 4yrs & up 06/21/2021, 06/05/2023   Moderna SARS-COV2 Booster Vaccination 04/18/2021   Moderna Sars-Covid-2 Vaccination 08/26/2019, 09/23/2019, 06/29/2020, 06/14/2022  Pneumococcal Conjugate-13 05/27/2014   Pneumococcal Polysaccharide-23 08/14/1997   RSV,unspecified 08/09/2022   Td 04/15/2011   Tdap 07/15/2015   Zoster Recombinant(Shingrix) 02/12/2017, 06/28/2017   Zoster, Live 08/15/2007   Pertinent  Health Maintenance Due  Topic Date Due   INFLUENZA VACCINE  03/14/2024   DEXA SCAN  Completed      03/26/2023    1:00 PM 06/26/2023    1:08 PM 07/24/2023    2:05 PM 09/24/2023    3:06 PM 10/22/2023    1:10 PM  Fall Risk  Falls in the past year? 0 0 0 0 0  Was there an injury with Fall? 0 0 0 0   Fall Risk Category Calculator 0 0 0 0   Patient at Risk for Falls Due to History of fall(s);Impaired  balance/gait;Impaired mobility Impaired mobility;Impaired balance/gait History of fall(s);Impaired balance/gait Impaired balance/gait;Impaired mobility Impaired balance/gait;Impaired mobility  Fall risk Follow up Falls evaluation completed Falls evaluation completed Falls evaluation completed;Education provided;Falls prevention discussed Falls evaluation completed Falls evaluation completed   Functional Status Survey:    Vitals:   11/13/23 1529  BP: 129/66  Pulse: 79  Resp: 18  Temp: (!) 97.3 F (36.3 C)  SpO2: 94%  Weight: 165 lb 3.2 oz (74.9 kg)  Height: 5' 4 (1.626 m)   Body mass index is 28.36 kg/m. Physical Exam Vitals reviewed.  Constitutional:      General: She is not in acute distress. HENT:     Head: Normocephalic.     Nose: Rhinorrhea present.     Right Turbinates: Enlarged.     Left Turbinates: Enlarged.     Mouth/Throat:     Mouth: Mucous membranes are moist.     Pharynx: No posterior oropharyngeal erythema.  Eyes:     General:        Right eye: No discharge.        Left eye: No discharge.  Cardiovascular:     Rate and Rhythm: Normal rate and regular rhythm.     Pulses: Normal pulses.     Heart sounds: Normal heart sounds.  Pulmonary:     Effort: Pulmonary effort is normal. No respiratory distress.     Breath sounds: Normal breath sounds. No wheezing or rales.  Abdominal:     Palpations: Abdomen is soft.  Musculoskeletal:     Cervical back: Neck supple.     Right lower leg: No edema.     Left lower leg: No edema.  Skin:    General: Skin is warm.     Capillary Refill: Capillary refill takes less than 2 seconds.  Neurological:     General: No focal deficit present.     Mental Status: She is alert and oriented to person, place, and time.  Psychiatric:        Mood and Affect: Mood normal.     Labs reviewed: Recent Labs    04/24/23 0000 09/20/23 0000  NA 137 136*  K 4.5 4.1  CL 103 103  CO2 21 22  BUN 22* 21  CREATININE 0.6 0.8  CALCIUM   8.6* 8.4*   Recent Labs    04/24/23 0000 09/20/23 0000  AST 23 22  ALT 14 15  ALKPHOS 69 54  ALBUMIN 3.9 3.9   Recent Labs    04/24/23 0000 09/20/23 0000  WBC 4.7 4.0  HGB 13.4 13.5  HCT 38 40  PLT 262 241   Lab Results  Component Value Date   TSH 3.60 09/20/2023   No results  found for: HGBA1C Lab Results  Component Value Date   CHOL 160 04/24/2023   HDL 56 04/24/2023   LDLCALC 90 04/24/2023   TRIG 70 04/24/2023    Significant Diagnostic Results in last 30 days:  No results found.  Assessment/Plan 1. Seasonal allergic rhinitis due to pollen (Primary) - increased nasal congestion, sneezing and wheezing x 1 week - increased pollen in community> same symptoms last year - nasal turbinates enlarged, eyes slightly red/glassy - cont Claritin  and Atrovent  nasal spray - start Flonase   - if not improvement> consider stopping Claritin  and trying Zyrtec/Xyzal - fluticasone  (FLONASE ) 50 MCG/ACT nasal spray; Place 1 spray into both nostrils daily for 21 days. - albuterol  (VENTOLIN  HFA) 108 (90 Base) MCG/ACT inhaler; Inhale 2 puffs into the lungs every 6 (six) hours as needed for wheezing or shortness of breath.   Family/ staff Communication: plan discussed with patient and nurse  Labs/tests ordered:  none

## 2023-11-14 DIAGNOSIS — M25512 Pain in left shoulder: Secondary | ICD-10-CM | POA: Diagnosis not present

## 2023-11-20 DIAGNOSIS — H43813 Vitreous degeneration, bilateral: Secondary | ICD-10-CM | POA: Diagnosis not present

## 2023-11-20 DIAGNOSIS — H353231 Exudative age-related macular degeneration, bilateral, with active choroidal neovascularization: Secondary | ICD-10-CM | POA: Diagnosis not present

## 2023-11-20 DIAGNOSIS — H43393 Other vitreous opacities, bilateral: Secondary | ICD-10-CM | POA: Diagnosis not present

## 2023-11-20 DIAGNOSIS — H04123 Dry eye syndrome of bilateral lacrimal glands: Secondary | ICD-10-CM | POA: Diagnosis not present

## 2023-11-27 DIAGNOSIS — M79671 Pain in right foot: Secondary | ICD-10-CM | POA: Diagnosis not present

## 2023-11-27 DIAGNOSIS — M79672 Pain in left foot: Secondary | ICD-10-CM | POA: Diagnosis not present

## 2023-11-27 DIAGNOSIS — B351 Tinea unguium: Secondary | ICD-10-CM | POA: Diagnosis not present

## 2023-12-27 ENCOUNTER — Non-Acute Institutional Stay: Payer: Self-pay | Admitting: Adult Health

## 2023-12-27 ENCOUNTER — Encounter: Payer: Self-pay | Admitting: Adult Health

## 2023-12-27 DIAGNOSIS — H6122 Impacted cerumen, left ear: Secondary | ICD-10-CM | POA: Diagnosis not present

## 2023-12-27 DIAGNOSIS — J302 Other seasonal allergic rhinitis: Secondary | ICD-10-CM | POA: Diagnosis not present

## 2023-12-27 DIAGNOSIS — R052 Subacute cough: Secondary | ICD-10-CM | POA: Diagnosis not present

## 2023-12-27 MED ORDER — FEXOFENADINE HCL 180 MG PO TABS: 180.0000 mg | ORAL_TABLET | Freq: Every day | ORAL | Status: DC

## 2023-12-27 MED ORDER — TRIAMCINOLONE ACETONIDE 55 MCG/ACT NA AERO: 1.0000 | INHALATION_SPRAY | Freq: Every day | NASAL | Status: AC

## 2023-12-27 NOTE — Addendum Note (Signed)
 Addended by: Raylene Calamity L on: 12/27/2023 01:00 PM   Modules accepted: Orders

## 2023-12-27 NOTE — Progress Notes (Signed)
 Location:  Medical illustrator of Service:  ALF (13) Provider:   Janace Mckusick, ANP [facility] [phone]   Marguerite Shiley, MD  Patient Care Team: Marguerite Shiley, MD as PCP - General (Internal Medicine) [facility] Lares, Azalea Bolder, NP as Nurse Practitioner (Geriatric Medicine) Knox Perl, MD as Consulting Physician (Cardiology) Alvan Jews, MD as Consulting Physician (Ophthalmology)  Extended Emergency Contact Information Primary Emergency Contact: Laramee,Stephen  [place] Home Phone: [phone] Work Phone: [phone] Relation: Son Secondary Emergency Contact: [facility] Mobile Phone: [phone] Relation: Friend  Code Status:  DNR Goals of care: Advanced Directive information    10/22/2023    1:11 PM  Advanced Directives  Does Patient Have a Medical Advance Directive? Yes  Type of Estate agent of [place];Living will;Out of facility DNR (pink MOST or yellow form)  Does patient want to make changes to medical advance directive? No - Patient declined  Copy of Healthcare Power of Attorney in Chart? Yes - validated most recent copy scanned in chart (See row information)     Chief Complaint  Patient presents with   Acute Visit    Cough and congestion    HPI:   The patient presents with persistent allergy symptoms including coughing and nasal drainage.  She experiences persistent allergy symptoms, primarily characterized by coughing that disrupts their sleep. The exact duration is unclear, but they describe it as 'pretty long'. They have been using Allegra after discontinuing Claritin , without noticing any improvement. Additional symptoms include sneezing and nasal drainage, though they are unsure of the color of the drainage. Occasionally, they cough up a small amount of phlegm. No itchy or watery eyes, sore throat, headache, or fever. They describe their condition as  feeling more like an allergy rather than an illness.  Her current medication regimen includes Allegra, Atrovent  nasal spray twice a day, and a saline nasal spray used as needed for nasal congestion. They have previously tried fluticasone  nasal spray but do not recall its effectiveness.  She has no history of asthma and have never smoked, although her mother smoked heavily.   She tried albuterol  but doesn't feel it helps.  Past Medical History:  Diagnosis Date   Actinic keratosis 09/23/2012   Anal fissure and fistula 07/28/2008   Arthralgia of temporomandibular joint 12/30/2009   Atrial fibrillation (HCC)    Atrial tachycardia (HCC)    s/p ablation 2008. [facility] (R free wall Atach)   Atrophy of vulva 09/23/2012   Blepharochalasis 03/01/2011   Cardiac dysrhythmia, unspecified 11/10/2009   Dermatophytosis of nail 02/28/2008   Dizziness and giddiness 04/03/2007   Dysphagia, unspecified(787.20) 01/17/2012   Edema 11/21/2006   External hemorrhoids without mention of complication 07/28/2008   HTN (hypertension)    Hyperlipidemia    Insomnia, unspecified 11/18/2004   Internal hemorrhoids without mention of complication 02/28/2008   Left bundle branch hemiblock    Loss of weight 03/05/2012   Myalgia and myositis, unspecified 01/05/2009   Orthostatic hypotension 03/22/2007   Other malaise and fatigue 03/05/2012   Pain in joint, site unspecified 02/28/2008   Palpitations 11/21/2006   Personality change due to conditions classified elsewhere    Pterygium eye, right 06/07/2020   Rectocele 09/23/2012   Restless legs syndrome (RLS)    Rosacea 09/23/2012   Sebaceous cyst 03/01/2011   Thumb pain 11/20/2012   Unspecified hereditary and idiopathic peripheral neuropathy    Past Surgical History:  Procedure Laterality Date  BLADDER SUSPENSION  1998   CATARACT EXTRACTION W/ INTRAOCULAR LENS IMPLANT Right 4/8//2008   Dr. Neil Balls   catheter ablation  2008   for atrial tachycardia   CYST EXCISION  1998   mucus cyst  removal from finger   PTERYGIUM EXCISION  06/07/2020   TOTAL ABDOMINAL HYSTERECTOMY  2001   uterine bleeding    Allergies  Allergen Reactions   Erythromycin Nausea And Vomiting   Flecainide Acetate     Postural hypotension   Penicillins     Outpatient Encounter Medications as of 12/27/2023  Medication Sig   acetaminophen  (TYLENOL ) 500 MG tablet Take 1,000 mg by mouth every 8 (eight) hours as needed.   acetaminophen  (TYLENOL ) 500 MG tablet Take 500 mg by mouth 2 (two) times daily. Tablet 500 mg amt: 1000 mg; oral twice a day: 8:00 am - 11:00 am , 8:00 pm - 10:00 pm   albuterol  (VENTOLIN  HFA) 108 (90 Base) MCG/ACT inhaler Inhale 2 puffs into the lungs every 6 (six) hours as needed for wheezing or shortness of breath.   alendronate  (FOSAMAX ) 70 MG tablet Take 1 tablet (70 mg total) by mouth every 7 (seven) days. Take with a full glass of water on an empty stomach. Sit upright for 30 min after administration   amiodarone  (PACERONE ) 100 MG tablet Take 100 mg by mouth daily.   amLODipine  (NORVASC ) 2.5 MG tablet Take 2.5 mg by mouth daily.   Artificial Tear Solution (GENTEAL TEARS OP) Apply 1 drop to eye 2 (two) times daily.   cholecalciferol (VITAMIN D3) 25 MCG (1000 UNIT) tablet Take 1,000 Units by mouth daily.   diclofenac  Sodium (VOLTAREN ) 1 % GEL Apply topically 2 (two) times daily. May self apply to left knee/hip prn discomfort   diltiazem  (CARDIZEM ) 60 MG tablet Take 1 tablet (60 mg total) by mouth 3 (three) times daily as needed (For heart rate >110 bpm).   fluticasone  (FLONASE ) 50 MCG/ACT nasal spray Place 1 spray into both nostrils daily for 21 days.   furosemide  (LASIX ) 20 MG tablet Take 1 tablet (20 mg total) by mouth daily as needed (for increased lower leg swelling.).   hydrALAZINE  (APRESOLINE ) 10 MG tablet Take 10 mg by mouth as needed.   ipratropium (ATROVENT ) 0.03 % nasal spray Place 2 sprays into both nostrils every 12 (twelve) hours.   levalbuterol  (XOPENEX ) 0.63 MG/3ML  nebulizer solution Take 0.63 mg by nebulization 2 (two) times daily as needed for wheezing or shortness of breath.   loratadine  (CLARITIN ) 10 MG tablet Take 1 tablet (10 mg total) by mouth daily.   losartan  (COZAAR ) 25 MG tablet Take 1 tablet (25 mg total) by mouth every evening.   METAMUCIL FIBER PO Take by mouth at bedtime.   Multiple Vitamins-Minerals (MULTIVITAMIN ADULTS 50+) TABS Take 1 tablet by mouth daily.   Multiple Vitamins-Minerals (PRESERVISION AREDS PO) Take 2 tablets by mouth daily.    Rivaroxaban  (XARELTO ) 15 MG TABS tablet Take 1 tablet (15 mg total) by mouth daily with supper.   rosuvastatin  (CRESTOR ) 10 MG tablet Take 1 tablet (10 mg total) by mouth daily.   Sod Fluoride-Potassium Nitrate (PREVIDENT 5000 ENAMEL PROTECT) 1.1-5 % GEL Place 1 Application onto teeth at bedtime.   No facility-administered encounter medications on file as of 12/27/2023.    Review of Systems  Constitutional:  Negative for activity change, appetite change, chills, diaphoresis, fatigue, fever and unexpected weight change.  HENT:  Positive for congestion, hearing loss, postnasal drip, rhinorrhea and sneezing. Negative for dental  problem, ear discharge, ear pain, facial swelling, sinus pressure, sinus pain, sore throat, tinnitus and trouble swallowing.   Respiratory:  Positive for cough. Negative for shortness of breath and wheezing.   Cardiovascular:  Positive for leg swelling. Negative for chest pain and palpitations.  Gastrointestinal:  Negative for abdominal distention, abdominal pain, constipation and diarrhea.  Genitourinary:  Negative for difficulty urinating and dysuria.  Musculoskeletal:  Positive for gait problem. Negative for arthralgias, back pain, joint swelling and myalgias.  Neurological:  Negative for dizziness, tremors, seizures, syncope, facial asymmetry, speech difficulty, weakness, light-headedness, numbness and headaches.  Psychiatric/Behavioral:  Negative for agitation, behavioral  problems and confusion.     Immunization History  Administered Date(s) Administered   Fluad Quad(high Dose 65+) 05/19/2022, 06/05/2023   Influenza Whole 06/12/2012   Influenza, High Dose Seasonal PF 05/23/2019, 06/11/2020   Influenza,inj,Quad PF,6+ Mos 05/13/2013, 06/07/2018   Influenza-Unspecified 05/27/2014, 04/24/2015, 04/13/2016, 04/24/2017, 06/11/2020, 06/11/2021   Moderna Covid-19 Fall Seasonal Vaccine 4yrs & older 12/08/2022   Moderna Covid-19 Vaccine Bivalent Booster 72yrs & up 06/21/2021, 06/05/2023   Moderna SARS-COV2 Booster Vaccination 04/18/2021   Moderna Sars-Covid-2 Vaccination 08/26/2019, 09/23/2019, 06/29/2020, 06/14/2022   Pneumococcal Conjugate-13 05/27/2014   Pneumococcal Polysaccharide-23 08/14/1997   RSV,unspecified 08/09/2022   Td 04/15/2011   Tdap 07/15/2015   Zoster Recombinant(Shingrix) 02/12/2017, 06/28/2017   Zoster, Live 08/15/2007   Pertinent  Health Maintenance Due  Topic Date Due   INFLUENZA VACCINE  03/14/2024   DEXA SCAN  Completed      03/26/2023    1:00 PM 06/26/2023    1:08 PM 07/24/2023    2:05 PM 09/24/2023    3:06 PM 10/22/2023    1:10 PM  Fall Risk  Falls in the past year? 0 0 0 0 0  Was there an injury with Fall? 0 0 0 0   Fall Risk Category Calculator 0 0 0 0   Patient at Risk for Falls Due to History of fall(s);Impaired balance/gait;Impaired mobility Impaired mobility;Impaired balance/gait History of fall(s);Impaired balance/gait Impaired balance/gait;Impaired mobility Impaired balance/gait;Impaired mobility  Fall risk Follow up Falls evaluation completed Falls evaluation completed Falls evaluation completed;Education provided;Falls prevention discussed Falls evaluation completed Falls evaluation completed   Functional Status Survey:    Vitals:   12/27/23 1222  BP: 109/62  Pulse: (!) 58  Resp: 16  Temp: 98.8 F (37.1 C)  SpO2: 93%   There is no height or weight on file to calculate BMI. Physical Exam Vitals and nursing  note reviewed.  Constitutional:      General: She is not in acute distress.    Appearance: She is not diaphoretic.  HENT:     Head: Normocephalic and atraumatic.     Right Ear: Tympanic membrane, ear canal and external ear normal.     Left Ear: There is impacted cerumen.     Nose: Congestion and rhinorrhea present.     Mouth/Throat:     Mouth: Mucous membranes are moist.     Pharynx: Oropharynx is clear. No oropharyngeal exudate.  Eyes:     Conjunctiva/sclera: Conjunctivae normal.     Pupils: Pupils are equal, round, and reactive to light.  Neck:     Vascular: No JVD.  Cardiovascular:     Rate and Rhythm: Normal rate and regular rhythm.     Heart sounds: No murmur heard. Pulmonary:     Effort: Pulmonary effort is normal. No respiratory distress.     Breath sounds: Rales (faint right base) present. No wheezing.  Skin:  General: Skin is warm and dry.  Neurological:     Mental Status: She is alert and oriented to person, place, and time.     Labs reviewed: Recent Labs    04/24/23 0000 09/20/23 0000  NA 137 136*  K 4.5 4.1  CL 103 103  CO2 21 22  BUN 22* 21  CREATININE 0.6 0.8  CALCIUM  8.6* 8.4*   Recent Labs    04/24/23 0000 09/20/23 0000  AST 23 22  ALT 14 15  ALKPHOS 69 54  ALBUMIN 3.9 3.9   Recent Labs    04/24/23 0000 09/20/23 0000  WBC 4.7 4.0  HGB 13.4 13.5  HCT 38 40  PLT 262 241   Lab Results  Component Value Date   TSH 3.60 09/20/2023   No results found for: "HGBA1C" Lab Results  Component Value Date   CHOL 160 04/24/2023   HDL 56 04/24/2023   LDLCALC 90 04/24/2023   TRIG 70 04/24/2023    Significant Diagnostic Results in last 30 days:  No results found.  Assessment/Plan  Allergic rhinitis Chronic allergic rhinitis with persistent symptoms despite treatment. Differential includes asthma or other allergies.  - Add Singulair to allergy regimen. Continue Allegra. - Prescribe Nasacort  nasal spray. - Consider allergist referral  for testing if symptoms persist.  Cough - Order chest x-ray to rule out pneumonia.  Cerumen impaction Cerumen impaction in left ear causing decreased hearing. - Administer cerumenolytic drops. - Schedule ear irrigation.   Labs/tests ordered:  CXR   Total time :  time greater than 50% of total time spent doing pt counseling and coordination of care    CXR showed pna vs atelectasis and peribronchial cuffing Will prescribe prednisone  and doxycycline 

## 2023-12-28 MED ORDER — PREDNISONE 20 MG PO TABS: 20.0000 mg | ORAL_TABLET | Freq: Every day | ORAL | 0 refills | Status: DC

## 2023-12-28 MED ORDER — DOXYCYCLINE HYCLATE 100 MG PO TABS: 100.0000 mg | ORAL_TABLET | Freq: Two times a day (BID) | ORAL | 0 refills | Status: DC

## 2023-12-28 NOTE — Addendum Note (Signed)
 Addended by: Americo Baker on: 12/28/2023 10:50 AM   Modules accepted: Orders

## 2024-01-15 DIAGNOSIS — J309 Allergic rhinitis, unspecified: Secondary | ICD-10-CM | POA: Diagnosis not present

## 2024-01-15 DIAGNOSIS — H04123 Dry eye syndrome of bilateral lacrimal glands: Secondary | ICD-10-CM | POA: Diagnosis not present

## 2024-01-15 DIAGNOSIS — H43393 Other vitreous opacities, bilateral: Secondary | ICD-10-CM | POA: Diagnosis not present

## 2024-01-15 DIAGNOSIS — R0982 Postnasal drip: Secondary | ICD-10-CM | POA: Diagnosis not present

## 2024-01-15 DIAGNOSIS — H353231 Exudative age-related macular degeneration, bilateral, with active choroidal neovascularization: Secondary | ICD-10-CM | POA: Diagnosis not present

## 2024-01-15 DIAGNOSIS — H43813 Vitreous degeneration, bilateral: Secondary | ICD-10-CM | POA: Diagnosis not present

## 2024-01-21 DIAGNOSIS — I10 Essential (primary) hypertension: Secondary | ICD-10-CM | POA: Diagnosis not present

## 2024-01-21 DIAGNOSIS — I480 Paroxysmal atrial fibrillation: Secondary | ICD-10-CM | POA: Diagnosis not present

## 2024-01-22 ENCOUNTER — Non-Acute Institutional Stay: Admitting: Internal Medicine

## 2024-01-22 ENCOUNTER — Encounter: Payer: Self-pay | Admitting: Internal Medicine

## 2024-01-22 VITALS — BP 112/60 | HR 65 | Temp 98.3°F | Ht 64.0 in | Wt 165.2 lb

## 2024-01-22 DIAGNOSIS — R635 Abnormal weight gain: Secondary | ICD-10-CM

## 2024-01-22 DIAGNOSIS — H353190 Nonexudative age-related macular degeneration, unspecified eye, stage unspecified: Secondary | ICD-10-CM | POA: Diagnosis not present

## 2024-01-22 DIAGNOSIS — M810 Age-related osteoporosis without current pathological fracture: Secondary | ICD-10-CM

## 2024-01-22 DIAGNOSIS — J302 Other seasonal allergic rhinitis: Secondary | ICD-10-CM | POA: Diagnosis not present

## 2024-01-22 DIAGNOSIS — E7800 Pure hypercholesterolemia, unspecified: Secondary | ICD-10-CM | POA: Diagnosis not present

## 2024-01-22 DIAGNOSIS — I10 Essential (primary) hypertension: Secondary | ICD-10-CM

## 2024-01-22 DIAGNOSIS — G3184 Mild cognitive impairment, so stated: Secondary | ICD-10-CM | POA: Diagnosis not present

## 2024-01-22 DIAGNOSIS — I4819 Other persistent atrial fibrillation: Secondary | ICD-10-CM

## 2024-01-22 NOTE — Progress Notes (Signed)
 Location:  [facility] features editor of Service:  Clinic (12)  Provider:   Code Status: DNR Goals of Care:     10/22/2023    1:11 PM  Advanced Directives  Does Patient Have a Medical Advance Directive? Yes  Type of Estate agent of [place];Living will;Out of facility DNR (pink MOST or yellow form)  Does patient want to make changes to medical advance directive? No - Patient declined  Copy of Healthcare Power of Attorney in Chart? Yes - validated most recent copy scanned in chart (See row information)     Chief Complaint  Patient presents with   Medical Management of Chronic Issues    3 month follow up    HPI: Patient is a 89 y.o. female seen today for medical management of chronic diseases.   [place]ives in [place]   Patient has a history of PAF, HLD, chronic venous insufficiency, left carotid stenosis and hypertension   Allergic rhinitis Seen by ENT No New recommendation On number of Meds Still has PND Has appointment with Allergist  Hypertension   Most of the blood pressures seems in good control PAF doing well with amiodarone  TSH in good limits   Continues to do her ADLS Walks with her walker Mood is good  Weight stable  Past Medical History:  Diagnosis Date   Actinic keratosis 09/23/2012   Anal fissure and fistula 07/28/2008   Arthralgia of temporomandibular joint 12/30/2009   Atrial fibrillation (HCC)    Atrial tachycardia (HCC)    s/p ablation 2008. [facility] (R free wall Atach)   Atrophy of vulva 09/23/2012   Blepharochalasis 03/01/2011   Cardiac dysrhythmia, unspecified 11/10/2009   Dermatophytosis of nail 02/28/2008   Dizziness and giddiness 04/03/2007   Dysphagia, unspecified(787.20) 01/17/2012   Edema 11/21/2006   External hemorrhoids without mention of complication 07/28/2008   HTN (hypertension)    Hyperlipidemia    Insomnia, unspecified 11/18/2004   Internal hemorrhoids without mention of complication 02/28/2008   Left bundle  branch hemiblock    Loss of weight 03/05/2012   Myalgia and myositis, unspecified 01/05/2009   Orthostatic hypotension 03/22/2007   Other malaise and fatigue 03/05/2012   Pain in joint, site unspecified 02/28/2008   Palpitations 11/21/2006   Personality change due to conditions classified elsewhere    Pterygium eye, right 06/07/2020   Rectocele 09/23/2012   Restless legs syndrome (RLS)    Rosacea 09/23/2012   Sebaceous cyst 03/01/2011   Thumb pain 11/20/2012   Unspecified hereditary and idiopathic peripheral neuropathy     Past Surgical History:  Procedure Laterality Date   BLADDER SUSPENSION  1998   CATARACT EXTRACTION W/ INTRAOCULAR LENS IMPLANT Right 4/8//2008   Dr. Neil Balls   catheter ablation  2008   for atrial tachycardia   CYST EXCISION  1998   mucus cyst removal from finger   PTERYGIUM EXCISION  06/07/2020   TOTAL ABDOMINAL HYSTERECTOMY  2001   uterine bleeding    Allergies  Allergen Reactions   Erythromycin Nausea And Vomiting   Flecainide Acetate     Postural hypotension   Penicillins     Outpatient Encounter Medications as of 01/22/2024  Medication Sig   acetaminophen  (TYLENOL ) 500 MG tablet Take 1,000 mg by mouth every 8 (eight) hours as needed.   acetaminophen  (TYLENOL ) 500 MG tablet Take 500 mg by mouth 2 (two) times daily. Tablet 500 mg amt: 1000 mg; oral twice a day: 8:00 am - 11:00 am , 8:00 pm - 10:00  pm   albuterol  (VENTOLIN  HFA) 108 (90 Base) MCG/ACT inhaler Inhale 2 puffs into the lungs every 6 (six) hours as needed for wheezing or shortness of breath.   alendronate  (FOSAMAX ) 70 MG tablet Take 1 tablet (70 mg total) by mouth every 7 (seven) days. Take with a full glass of water on an empty stomach. Sit upright for 30 min after administration   amiodarone  (PACERONE ) 100 MG tablet Take 100 mg by mouth daily.   amLODipine  (NORVASC ) 2.5 MG tablet Take 2.5 mg by mouth daily. 5mg , oral, once a morning   Artificial Tear Solution (GENTEAL TEARS OP) Apply 1 drop to eye 2  (two) times daily.   cholecalciferol (VITAMIN D3) 25 MCG (1000 UNIT) tablet Take 1,000 Units by mouth daily.   diclofenac  Sodium (VOLTAREN ) 1 % GEL Apply topically 2 (two) times daily. May self apply to left knee/hip prn discomfort   fexofenadine  (ALLEGRA  ALLERGY) 180 MG tablet Take 1 tablet (180 mg total) by mouth daily.   furosemide  (LASIX ) 20 MG tablet Take 1 tablet (20 mg total) by mouth daily as needed (for increased lower leg swelling.).   hydrALAZINE  (APRESOLINE ) 10 MG tablet Take 10 mg by mouth as needed. One tab, oral, Three Times a day - PRN, Give PRN for SBP greater than or equal to 170   hydrALAZINE  (APRESOLINE ) 25 MG tablet Take 25 mg by mouth 2 (two) times daily. 25mg , oral, Twice a day, DO NOT HOLD HYDRALAZINE  UNLESS SBP IS <100.   ipratropium (ATROVENT ) 0.03 % nasal spray Place 2 sprays into both nostrils every 12 (twelve) hours.   levalbuterol  (XOPENEX ) 0.63 MG/3ML nebulizer solution Take 0.63 mg by nebulization 2 (two) times daily as needed for wheezing or shortness of breath.   losartan  (COZAAR ) 25 MG tablet Take 1 tablet (25 mg total) by mouth every evening.   METAMUCIL FIBER PO Take by mouth at bedtime.   montelukast (SINGULAIR) 10 MG tablet Take 10 mg by mouth at bedtime. 10 mg, oral, At bedtime   Multiple Vitamins-Minerals (MULTIVITAMIN ADULTS 50+) TABS Take 1 tablet by mouth daily.   Multiple Vitamins-Minerals (PRESERVISION AREDS PO) Take 2 tablets by mouth daily.    Rivaroxaban  (XARELTO ) 15 MG TABS tablet Take 1 tablet (15 mg total) by mouth daily with supper.   rosuvastatin  (CRESTOR ) 10 MG tablet Take 1 tablet (10 mg total) by mouth daily.   Sod Fluoride-Potassium Nitrate (PREVIDENT 5000 ENAMEL PROTECT) 1.1-5 % GEL Place 1 Application onto teeth at bedtime.   sodium chloride  (SALINE MIST) 0.65 % nasal spray Place 1 spray into the nose at bedtime as needed for congestion. 1, nasal, At bedtime- PRN, May keep at bedside for occasional use during the night for dryness    triamcinolone  (NASACORT ) 55 MCG/ACT AERO nasal inhaler Place 1 spray into the nose daily.   [DISCONTINUED] diltiazem  (CARDIZEM ) 60 MG tablet Take 1 tablet (60 mg total) by mouth 3 (three) times daily as needed (For heart rate >110 bpm). (Patient not taking: Reported on 01/17/2024)   [DISCONTINUED] doxycycline  (VIBRA -TABS) 100 MG tablet Take 1 tablet (100 mg total) by mouth 2 (two) times daily. (Patient not taking: Reported on 01/17/2024)   [DISCONTINUED] predniSONE  (DELTASONE ) 20 MG tablet Take 1 tablet (20 mg total) by mouth daily with breakfast. (Patient not taking: Reported on 01/17/2024)   No facility-administered encounter medications on file as of 01/22/2024.    Review of Systems:  Review of Systems  Constitutional:  Negative for activity change and appetite change.  HENT:  Positive for postnasal drip and rhinorrhea.   Respiratory:  Negative for cough and shortness of breath.   Cardiovascular:  Negative for leg swelling.  Gastrointestinal:  Negative for constipation.  Genitourinary: Negative.   Musculoskeletal:  Positive for gait problem. Negative for arthralgias and myalgias.  Skin: Negative.   Neurological:  Negative for dizziness and weakness.  Psychiatric/Behavioral:  Negative for confusion, dysphoric mood and sleep disturbance.     Health Maintenance  Topic Date Due   Medicare Annual Wellness (AWV)  12/25/2023   COVID-19 Vaccine (8 - 2024-25 season) 06/13/2024 (Originally 07/31/2023)   INFLUENZA VACCINE  03/14/2024   DTaP/Tdap/Td (3 - Td or Tdap) 07/14/2025   Pneumonia Vaccine 47+ Years old  Completed   DEXA SCAN  Completed   Zoster Vaccines- Shingrix  Completed   HPV VACCINES  Aged Out   Meningococcal B Vaccine  Aged Out    Physical Exam: Vitals:   01/22/24 1314  BP: 112/60  Pulse: 65  Temp: 98.3 F (36.8 C)  SpO2: 97%  Weight: 165 lb 3.2 oz (74.9 kg)  Height: 5\' 4"  (1.626 m)   Body mass index is 28.36 kg/m. Physical Exam Vitals reviewed.  Constitutional:       Appearance: Normal appearance.  HENT:     Head: Normocephalic.     Right Ear: Tympanic membrane normal.     Left Ear: Tympanic membrane normal.     Nose: Nose normal. No congestion.     Mouth/Throat:     Mouth: Mucous membranes are moist.     Pharynx: Oropharynx is clear.  Eyes:     Pupils: Pupils are equal, round, and reactive to light.  Cardiovascular:     Rate and Rhythm: Normal rate and regular rhythm.     Pulses: Normal pulses.     Heart sounds: Normal heart sounds. No murmur heard. Pulmonary:     Effort: Pulmonary effort is normal.     Breath sounds: Normal breath sounds.  Abdominal:     General: Abdomen is flat. Bowel sounds are normal.     Palpations: Abdomen is soft.  Musculoskeletal:        General: No swelling.     Cervical back: Neck supple.  Skin:    General: Skin is warm.  Neurological:     General: No focal deficit present.     Mental Status: She is alert and oriented to person, place, and time.  Psychiatric:        Mood and Affect: Mood normal.        Thought Content: Thought content normal.     Labs reviewed: Basic Metabolic Panel: Recent Labs    04/24/23 0000 09/20/23 0000  NA 137 136*  K 4.5 4.1  CL 103 103  CO2 21 22  BUN 22* 21  CREATININE 0.6 0.8  CALCIUM  8.6* 8.4*  TSH  --  3.60   Liver Function Tests: Recent Labs    04/24/23 0000 09/20/23 0000  AST 23 22  ALT 14 15  ALKPHOS 69 54  ALBUMIN 3.9 3.9   No results for input(s): "LIPASE", "AMYLASE" in the last 8760 hours. No results for input(s): "AMMONIA" in the last 8760 hours. CBC: Recent Labs    04/24/23 0000 09/20/23 0000  WBC 4.7 4.0  HGB 13.4 13.5  HCT 38 40  PLT 262 241   Lipid Panel: Recent Labs    04/24/23 0000  CHOL 160  HDL 56  LDLCALC 90  TRIG 70   No results found  for: "HGBA1C"  Procedures since last visit: No results found.  Assessment/Plan 1. Seasonal allergic rhinitis, unspecified trigger (Primary) She was just started on Allegra  5/10 Claritin   was stopped Pepcid was added by ENT Also on Atrovent  Nasal Spray and Nasocort with no benefit Has appointment wth Allergist  2. Essential hypertension On Norvasc  Losartan  Hydralazine   3. Pure hypercholesterolemia Statin LDL 90 in 9/24  4. Persistent atrial fibrillation (HCC) Amiodarone  and Xarelto   5. Age-related osteoporosis without current pathological fracture Fosamax  since 01/13/2023 /6. Mild cognitive impairment/ Doing well in [place]  7. Nonexudative age-related macular degeneration, unspecified laterality, unspecified stage Follows with Opthalmology  8. Weight gain Has stabilized very active walks with her walker and does all exercises    Labs/tests ordered:   Next appt:  04/28/2024

## 2024-01-30 DIAGNOSIS — L814 Other melanin hyperpigmentation: Secondary | ICD-10-CM | POA: Diagnosis not present

## 2024-01-30 DIAGNOSIS — L821 Other seborrheic keratosis: Secondary | ICD-10-CM | POA: Diagnosis not present

## 2024-01-30 DIAGNOSIS — L570 Actinic keratosis: Secondary | ICD-10-CM | POA: Diagnosis not present

## 2024-01-30 DIAGNOSIS — D1801 Hemangioma of skin and subcutaneous tissue: Secondary | ICD-10-CM | POA: Diagnosis not present

## 2024-01-30 DIAGNOSIS — L853 Xerosis cutis: Secondary | ICD-10-CM | POA: Diagnosis not present

## 2024-02-05 DIAGNOSIS — M79672 Pain in left foot: Secondary | ICD-10-CM | POA: Diagnosis not present

## 2024-02-05 DIAGNOSIS — B351 Tinea unguium: Secondary | ICD-10-CM | POA: Diagnosis not present

## 2024-02-05 DIAGNOSIS — M79671 Pain in right foot: Secondary | ICD-10-CM | POA: Diagnosis not present

## 2024-02-26 ENCOUNTER — Ambulatory Visit: Admitting: Allergy & Immunology

## 2024-02-26 ENCOUNTER — Other Ambulatory Visit: Payer: Self-pay

## 2024-02-26 ENCOUNTER — Encounter: Payer: Self-pay | Admitting: Allergy & Immunology

## 2024-02-26 VITALS — BP 118/60 | HR 62 | Temp 98.1°F | Resp 16 | Ht 63.39 in | Wt 167.7 lb

## 2024-02-26 DIAGNOSIS — R0982 Postnasal drip: Secondary | ICD-10-CM

## 2024-02-26 DIAGNOSIS — K219 Gastro-esophageal reflux disease without esophagitis: Secondary | ICD-10-CM

## 2024-02-26 MED ORDER — IPRATROPIUM BROMIDE 0.06 % NA SOLN: 2.0000 | Freq: Three times a day (TID) | NASAL | 5 refills | Status: DC

## 2024-02-26 MED ORDER — CARBINOXAMINE MALEATE 4 MG PO TABS: 4.0000 mg | ORAL_TABLET | Freq: Two times a day (BID) | ORAL | 5 refills | Status: AC

## 2024-02-26 NOTE — Patient Instructions (Addendum)
 1. Postnasal drip - We will get some allergy testing via the blood and see what we come up with.  - We will call you in 1-2 weeks with the results of the testing.  - We are going to increase your Atrovent  to a higher strength formulation. - Stop the Allegra  (fexofenadine ) and start carbinoxamine  4 mg every 12 hours to see if this helps with your postnasal drip.  2. Return in about 3 months (around 05/28/2024). You can have the follow up appointment with Dr. Iva or a Nurse Practicioner (our Nurse Practitioners are excellent and always have Physician oversight!).    Please inform us  of any Emergency Department visits, hospitalizations, or changes in symptoms. Call us  before going to the ED for breathing or allergy symptoms since we might be able to fit you in for a sick visit. Feel free to contact us  anytime with any questions, problems, or concerns.  It was a pleasure to meet you today!  Websites that have reliable patient information: 1. American Academy of Asthma, Allergy, and Immunology: www.aaaai.org 2. Food Allergy Research and Education (FARE): foodallergy.org 3. Mothers of Asthmatics: http://www.asthmacommunitynetwork.org 4. American College of Allergy, Asthma, and Immunology: www.acaai.org      "Like" us  on Facebook and Instagram for our latest updates!      A healthy democracy works best when [employer] participate! Make sure you are registered to vote! If you have moved or changed any of your contact information, you will need to get this updated before voting! Scan the QR codes below to learn more!

## 2024-02-26 NOTE — Progress Notes (Signed)
 NEW PATIENT  Date of Service/Encounter:  02/26/24  Consult requested by: Charlanne Fredia CROME, MD   Assessment:   Postnasal drip - getting environmental allergy panel via the blood  GERD - started on famotidine twice daily in June 2025 without resolution of current symptoms  Complicated past medical history including hypertension, atrial fibrillation, squamous cell carcinoma, gout, and hypercholesterolemia  Plan/Recommendations:   1. Postnasal drip - We will get some allergy testing via the blood and see what we come up with.  - We will call you in 1-2 weeks with the results of the testing.  - We are going to increase your Atrovent  to a higher strength formulation. - Stop the Allegra  (fexofenadine ) and start carbinoxamine  4 mg every 12 hours to see if this helps with your postnasal drip.  2. Return in about 3 months (around 05/28/2024). You can have the follow up appointment with Dr. Iva or a Nurse Practicioner (our Nurse Practitioners are excellent and always have Physician oversight!).   This note in its entirety was forwarded to the Provider who requested this consultation.  Subjective:   Rebekah  Graham is a 89 y.o. female presenting today for evaluation of  Chief Complaint  Patient presents with   Nasal Congestion    Having some post nasal drip, coughing    Rebekah  Graham has a history of the following: Patient Active Problem List   Diagnosis Date Noted   Age-related osteoporosis without current pathological fracture 01/13/2023   Mild cognitive impairment 09/05/2021   Unsteady gait when walking 07/20/2021   Stenosis of left carotid artery 11/08/2020   Hypercoagulable state due to atrial fibrillation (HCC) 05/05/2020   Pure hypercholesterolemia 10/30/2018   Bilateral hearing loss due to cerumen impaction 04/04/2017   Dupuytren's disease of palm of right hand 04/04/2017   Acute gouty arthritis 12/30/2015   Squamous cell carcinoma, leg 12/30/2015   Nonexudative  macular degeneration 01/04/2015   Nuclear sclerotic cataract of left eye 03/30/2014   Long term current use of anticoagulant therapy 03/30/2014   Impacted cerumen 09/29/2013   Diarrhea 09/11/2013   Hyperlipidemia    Lens replaced 03/19/2013   Pseudophakia 03/19/2013   H/O nonmelanoma skin cancer 03/04/2013   Ectropion 01/28/2013   Thumb pain 11/20/2012   Rosacea 09/23/2012   Cataract 09/19/2011   Corneal opacity 09/19/2011   Blepharochalasis 09/19/2011   Pterygium 09/19/2011   Left corneal scar 09/19/2011   Pseudoptosis 09/19/2011   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  Discussed the use of AI scribe software for clinical note transcription with the patient and/or guardian, who gave verbal consent to proceed.  Rebekah  Graham was referred by Charlanne Fredia CROME, MD.     Rebekah Graham  is a 89 y.o. female presenting for an evaluation of postnasal drip.  Asthma/Respiratory Symptom History: She has a persistent cough, which she considers normal, and has never needed an inhaler.  She has never seen a pulmonologist.  She is not a smoker.  Allergic Rhinitis Symptom History: She has been experiencing chronic throat clearing and post-nasal drip for the past few months, with the sensation localized to the back of her throat. She uses two nasal sprays, including ipratropium, which alleviates nasal symptoms but not the post-nasal drip. She is also on Allegra  and montelukast, though she  is uncertain of their effectiveness.  She saw Alm Camps PA at [facility] in June or 2025.  At that time, she was started on the famotidine and continued on Allegra  and ipratropium.  GERD Symptom History: She denies current reflux or  heartburn but recalls having reflux years ago. She started famotidine a month ago but is unsure of its impact on her symptoms.  Review of the ENT note shows that she was started on the famotidine at her ENT visit.  She has a cardiologist due to her atrial fibrillation.  She last saw Dr. Ladona in February 2025.  At that visit, she was doing very well on Xarelto  15 mg daily.  Her blood pressure was controlled on losartan .  She has diltiazem  to be used as needed for palpitations.  EKG was normal.  She resides in an assisted living facility, [facility], for the past ten years without recent environmental changes or new pets. She has not undergone allergy testing previously.  Otherwise, there is no history of other atopic diseases, including asthma, food allergies, drug allergies, stinging insect allergies, or contact dermatitis. There is no significant infectious history. Vaccinations are up to date.    Past Medical History: Patient Active Problem List   Diagnosis Date Noted   Age-related osteoporosis without current pathological fracture 01/13/2023   Mild cognitive impairment 09/05/2021   Unsteady gait when walking 07/20/2021   Stenosis of left carotid artery 11/08/2020   Hypercoagulable state due to atrial fibrillation (HCC) 05/05/2020   Pure hypercholesterolemia 10/30/2018   Bilateral hearing loss due to cerumen impaction 04/04/2017   Dupuytren's disease of palm of right hand 04/04/2017   Acute gouty arthritis 12/30/2015   Squamous cell carcinoma, leg 12/30/2015   Nonexudative macular degeneration 01/04/2015   Nuclear sclerotic cataract of left eye 03/30/2014   Long term current use of anticoagulant therapy 03/30/2014   Impacted cerumen 09/29/2013   Diarrhea 09/11/2013   Hyperlipidemia    Lens replaced 03/19/2013   Pseudophakia 03/19/2013   H/O nonmelanoma skin cancer 03/04/2013   Ectropion 01/28/2013   Thumb pain 11/20/2012   Rosacea 09/23/2012   Cataract 09/19/2011   Corneal  opacity 09/19/2011   Blepharochalasis 09/19/2011   Pterygium 09/19/2011   Left corneal scar 09/19/2011   Pseudoptosis 09/19/2011   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 If you have any questions, ask your nurse or doctor.          STOP taking these medications    ipratropium 0.03 % nasal spray Commonly known as: ATROVENT  Replaced by: ipratropium 0.06 % nasal spray Stopped by: Rebekah Graham       TAKE these medications    acetaminophen  500 MG tablet Commonly known as: TYLENOL  Take 1,000 mg by mouth every 8 (eight) hours as needed.   acetaminophen  500 MG tablet Commonly known as: TYLENOL  Take 500 mg by mouth 2 (two) times daily. Tablet 500 mg amt: 1000 mg; oral twice a day: 8:00 am -  11:00 am , 8:00 pm - 10:00 pm   albuterol  108 (90 Base) MCG/ACT inhaler Commonly known as: VENTOLIN  HFA Inhale 2 puffs into the lungs every 6 (six) hours as needed for wheezing or shortness of breath.   alendronate  70 MG tablet Commonly known as: FOSAMAX  Take 1 tablet (70 mg total) by mouth every 7 (seven) days. Take with a full glass of water on an empty stomach. Sit upright for 30 min after administration   amiodarone  100 MG tablet Commonly known as: PACERONE  Take 100 mg by mouth daily.   amLODipine  2.5 MG tablet Commonly known as: NORVASC  Take 2.5 mg by mouth daily. 5mg , oral, once a morning   Carbinoxamine  Maleate 4 MG Tabs Take 1 tablet (4 mg total) by mouth in the morning and at bedtime. Started by: [place] Emmitt Louis Brihany Butch    cholecalciferol 25 MCG (1000 UNIT) tablet Commonly known as: VITAMIN D3 Take 1,000 Units by mouth daily.   fexofenadine  180 MG tablet Commonly known as: Allegra  Allergy Take 1 tablet (180 mg total) by mouth daily.   furosemide  20 MG tablet Commonly known as: LASIX  Take 1 tablet (20 mg total) by mouth daily as needed (for increased lower leg swelling.).   GENTEAL TEARS OP Apply 1 drop to eye 2 (two) times daily.   hydrALAZINE  10 MG tablet Commonly known as: APRESOLINE  Take 10 mg by mouth as needed. One tab, oral, Three Times a day - PRN, Give PRN for SBP greater than or equal to 170   hydrALAZINE  25 MG tablet Commonly known as: APRESOLINE  Take 25 mg by mouth 2 (two) times daily. 25mg , oral, Twice a day, DO NOT HOLD HYDRALAZINE  UNLESS SBP IS <100.   ipratropium 0.06 % nasal spray Commonly known as: ATROVENT  Place 2 sprays into both nostrils 3 (three) times daily. Replaces: ipratropium 0.03 % nasal spray Started by: Rebekah Graham   losartan  25 MG tablet Commonly known as: COZAAR  Take 1 tablet (25 mg total) by mouth every evening.   METAMUCIL FIBER PO Take by mouth at bedtime.   montelukast 10 MG tablet Commonly known as: SINGULAIR Take 10 mg by mouth at bedtime. 10 mg, oral, At bedtime   PRESERVISION AREDS PO Take 2 tablets by mouth daily.   Multivitamin Adults 50+ Tabs Take 1 tablet by mouth daily.   PreviDent 5000 Enamel Protect 1.1-5 % Gel Generic drug: Sod Fluoride-Potassium Nitrate Place 1 Application onto teeth at bedtime.   Rivaroxaban  15 MG Tabs tablet Commonly known as: XARELTO  Take 1 tablet (15 mg total) by mouth daily with supper.   rosuvastatin  10 MG tablet Commonly known as: CRESTOR  Take 1 tablet (10 mg total) by mouth daily.   SALINE MIST 0.65 % nasal spray Generic drug: sodium chloride  Place 1 spray into the nose at bedtime as needed for congestion. 1, nasal, At bedtime- PRN, May keep at bedside for occasional use during the night for  dryness   triamcinolone  55 MCG/ACT Aero nasal inhaler Commonly known as: NASACORT  Place 1 spray into the nose daily.   Voltaren  1 % Gel Generic drug: diclofenac  Sodium Apply topically 2 (two) times daily. May self apply to left knee/hip prn discomfort   Xopenex  0.63 MG/3ML nebulizer solution Generic drug: levalbuterol  Take 0.63 mg by nebulization 2 (two) times daily as needed for wheezing or shortness of breath.        Birth History: non-contributory  Developmental History: non-contributory  Past Surgical History: Past Surgical History:  Procedure Laterality Date   ADENOIDECTOMY  BLADDER SUSPENSION  1998   CATARACT EXTRACTION W/ INTRAOCULAR LENS IMPLANT Right 11/20/2006   Dr. Carrie   catheter ablation  2008   for atrial tachycardia   CYST EXCISION  1998   mucus cyst removal from finger   PTERYGIUM EXCISION  06/07/2020   TONSILLECTOMY     TOTAL ABDOMINAL HYSTERECTOMY  2001   uterine bleeding     Family History: Family History  Problem Relation Age of Onset   Heart disease Mother    Heart disease Father    Cancer Sister        breast     Social History: Rebekah Graham  lives at [facility].  Her apartment is [address] years old.  There is carpeting throughout the home.  There are no animals inside and outside of the home.  She does have dust mite covers on her bed.  She does not smoke.  There are no fumes, chemicals, or dust exposure.  There is a HEPA filter in the home.  She does not live near interstate or industrial area.  She previously worked as a Runner, broadcasting/film/video.  She is from [place]  and grew up in [place]  for period of time while her father was in the service.   Review of systems otherwise negative other than that mentioned in the HPI.    Objective:   Blood pressure 118/60, pulse 62, temperature 98.1 F (36.7 C), temperature source Temporal, resp. rate 16, height 5' 3.39 (1.61 m), weight 167 lb 11.2 oz (76.1 kg), SpO2 96%. Body mass index  is 29.35 kg/m.     Physical Exam Vitals reviewed.  Constitutional:      Appearance: Normal appearance. She is well-developed.     Comments: Interactive.  Talkative.  HENT:     Head: Normocephalic and atraumatic.     Right Ear: Tympanic membrane, ear canal and external ear normal. No drainage, swelling or tenderness. Tympanic membrane is not injected, scarred, erythematous, retracted or bulging.     Left Ear: Tympanic membrane, ear canal and external ear normal. No drainage, swelling or tenderness. Tympanic membrane is not injected, scarred, erythematous, retracted or bulging.     Nose: No nasal deformity, septal deviation, mucosal edema or rhinorrhea.     Right Turbinates: Enlarged, swollen and pale.     Left Turbinates: Enlarged, swollen and pale.     Right Sinus: No maxillary sinus tenderness or frontal sinus tenderness.     Left Sinus: No maxillary sinus tenderness or frontal sinus tenderness.     Mouth/Throat:     Lips: Pink.     Mouth: Mucous membranes are moist. Mucous membranes are not pale and not dry.     Pharynx: Uvula midline.     Comments: Mild cobblestoning. Eyes:     General: Lids are normal. Allergic shiner present.        Right eye: No discharge.        Left eye: No discharge.     Conjunctiva/sclera: Conjunctivae normal.     Right eye: Right conjunctiva is not injected. No chemosis.    Left eye: Left conjunctiva is not injected. No chemosis.    Pupils: Pupils are equal, round, and reactive to light.  Cardiovascular:     Rate and Rhythm: Normal rate and regular rhythm.     Heart sounds: Normal heart sounds.  Pulmonary:     Effort: Pulmonary effort is normal. No tachypnea, accessory muscle usage or respiratory distress.     Breath sounds: Normal breath sounds. No wheezing,  rhonchi or rales.     Comments: Moving air well in all lung fields.  No increased work of breathing. Chest:     Chest wall: No tenderness.  Abdominal:     Tenderness: There is no abdominal  tenderness. There is no guarding or rebound.  Lymphadenopathy:     Head:     Right side of head: No submandibular, tonsillar or occipital adenopathy.     Left side of head: No submandibular, tonsillar or occipital adenopathy.     Cervical: No cervical adenopathy.  Skin:    Coloration: Skin is not pale.     Findings: No abrasion, erythema, petechiae or rash. Rash is not papular, urticarial or vesicular.  Neurological:     Mental Status: [name]  Psychiatric:        Behavior: Behavior is cooperative.      Diagnostic studies: labs sent instead          Rebekah Shaggy, MD [facility] 

## 2024-02-27 DIAGNOSIS — L570 Actinic keratosis: Secondary | ICD-10-CM | POA: Diagnosis not present

## 2024-02-27 DIAGNOSIS — L578 Other skin changes due to chronic exposure to nonionizing radiation: Secondary | ICD-10-CM | POA: Diagnosis not present

## 2024-02-27 DIAGNOSIS — L821 Other seborrheic keratosis: Secondary | ICD-10-CM | POA: Diagnosis not present

## 2024-02-27 DIAGNOSIS — L814 Other melanin hyperpigmentation: Secondary | ICD-10-CM | POA: Diagnosis not present

## 2024-02-29 ENCOUNTER — Ambulatory Visit: Payer: Self-pay | Admitting: Allergy & Immunology

## 2024-02-29 LAB — ALLERGEN PROFILE, MOLD
Aureobasidi Pullulans IgE: <0.1 kU/L
Candida Albicans IgE: <0.1 kU/L
M009-IgE Fusarium proliferatum: <0.1 kU/L
M014-IgE Epicoccum purpur: <0.1 kU/L
Mucor Racemosus IgE: <0.1 kU/L
Phoma Betae IgE: <0.1 kU/L
Setomelanomma Rostrat: <0.1 kU/L
Stemphylium Herbarum IgE: <0.1 kU/L

## 2024-02-29 LAB — ALLERGENS W/COMP RFLX AREA 2
Alternaria Alternata IgE: <0.1 kU/L
Aspergillus Fumigatus IgE: <0.1 kU/L
Bermuda Grass IgE: <0.1 kU/L
Cedar, Mountain IgE: <0.1 kU/L
Cladosporium Herbarum IgE: <0.1 kU/L
Cockroach, German IgE: <0.1 kU/L
Common Silver Birch IgE: <0.1 kU/L
Cottonwood IgE: <0.1 kU/L
D Farinae IgE: <0.1 kU/L
D Pteronyssinus IgE: <0.1 kU/L
E001-IgE Cat Dander: <0.1 kU/L
E005-IgE Dog Dander: <0.1 kU/L
Elm, American IgE: <0.1 kU/L
IgE (Immunoglobulin E), Serum: <2 [IU]/mL — ABNORMAL LOW (ref 6–495)
Johnson Grass IgE: <0.1 kU/L
Maple/Box Elder IgE: <0.1 kU/L
Mouse Urine IgE: <0.1 kU/L
Oak, White IgE: <0.1 kU/L
Pecan, Hickory IgE: <0.1 kU/L
Penicillium Chrysogen IgE: <0.1 kU/L
Pigweed, Rough IgE: <0.1 kU/L
Ragweed, Short IgE: <0.1 kU/L
Sheep Sorrel IgE Qn: <0.1 kU/L
Timothy Grass IgE: <0.1 kU/L
White Mulberry IgE: <0.1 kU/L

## 2024-03-11 DIAGNOSIS — H43813 Vitreous degeneration, bilateral: Secondary | ICD-10-CM | POA: Diagnosis not present

## 2024-03-11 DIAGNOSIS — H04123 Dry eye syndrome of bilateral lacrimal glands: Secondary | ICD-10-CM | POA: Diagnosis not present

## 2024-03-11 DIAGNOSIS — H43393 Other vitreous opacities, bilateral: Secondary | ICD-10-CM | POA: Diagnosis not present

## 2024-03-11 DIAGNOSIS — H353231 Exudative age-related macular degeneration, bilateral, with active choroidal neovascularization: Secondary | ICD-10-CM | POA: Diagnosis not present

## 2024-04-15 DIAGNOSIS — M79671 Pain in right foot: Secondary | ICD-10-CM | POA: Diagnosis not present

## 2024-04-15 DIAGNOSIS — M79672 Pain in left foot: Secondary | ICD-10-CM | POA: Diagnosis not present

## 2024-04-15 DIAGNOSIS — B351 Tinea unguium: Secondary | ICD-10-CM | POA: Diagnosis not present

## 2024-04-28 ENCOUNTER — Encounter: Payer: Self-pay | Admitting: Adult Health

## 2024-04-28 ENCOUNTER — Non-Acute Institutional Stay: Admitting: Adult Health

## 2024-04-28 VITALS — BP 118/62 | HR 61 | Temp 98.1°F | Ht 63.0 in | Wt 168.8 lb

## 2024-04-28 DIAGNOSIS — I10 Essential (primary) hypertension: Secondary | ICD-10-CM

## 2024-04-28 DIAGNOSIS — G3184 Mild cognitive impairment, so stated: Secondary | ICD-10-CM

## 2024-04-28 DIAGNOSIS — I480 Paroxysmal atrial fibrillation: Secondary | ICD-10-CM

## 2024-04-28 DIAGNOSIS — M810 Age-related osteoporosis without current pathological fracture: Secondary | ICD-10-CM

## 2024-04-28 DIAGNOSIS — M25512 Pain in left shoulder: Secondary | ICD-10-CM | POA: Insufficient documentation

## 2024-04-28 DIAGNOSIS — J309 Allergic rhinitis, unspecified: Secondary | ICD-10-CM | POA: Diagnosis not present

## 2024-04-28 DIAGNOSIS — I4819 Other persistent atrial fibrillation: Secondary | ICD-10-CM | POA: Diagnosis not present

## 2024-04-28 NOTE — Progress Notes (Signed)
 Location:  [facility]  END:ropwpr  Provider:  Bari America, ANP [facility] [phone]   Code Status: DNR Goals of Care:     10/22/2023    1:11 PM  Advanced Directives  Does Patient Have a Medical Advance Directive? Yes  Type of Estate agent of [place];Living will;Out of facility DNR (pink MOST or yellow form)  Does patient want to make changes to medical advance directive? No - Patient declined  Copy of Healthcare Power of Attorney in Chart? Yes - validated most recent copy scanned in chart (See row information)     Chief Complaint  Patient presents with   Follow-up    3 month follow up  Patient has concerns about a cough that's been going on for awhile now.    HPI: Patient is a 89 y.o. female seen today for medical management of chronic diseases.    Allergic rhinitis: Saw allergist and environmental allergy panel was negative. Seen 02/26/24 and atrovent  increased and Allergra discontinued, carbinoxamine  started   OP: bone density study on 01/02/23 which showed a T score of -2.7 indicating osteoporosis. Started on Fosamax  May 2024 Exercises classes and walks regularly  MCI:  MMSE 28/30 passed clock 08/17/22  Currently living in [facility] and doing well  Afib: no palpitations. No sob No swelling, wears compression hose  Bilateral carotid stenosis asymptomatic last carotid duplex per Dr Ladona 2022  HTN controlled  HLD Lab Results  Component Value Date   LDLCALC 90 04/24/2023    Has wet AMD followed with ophthalmology      Past Medical History:  Diagnosis Date   Actinic keratosis 09/23/2012   Anal fissure and fistula 07/28/2008   Arthralgia of temporomandibular joint 12/30/2009   Atrial fibrillation (HCC)    Atrial tachycardia (HCC)    s/p ablation 2008. [facility] (R free wall Atach)   Atrophy of vulva 09/23/2012   Blepharochalasis 03/01/2011   Cardiac dysrhythmia, unspecified 11/10/2009   Dermatophytosis of nail  02/28/2008   Dizziness and giddiness 04/03/2007   Dysphagia, unspecified(787.20) 01/17/2012   Edema 11/21/2006   External hemorrhoids without mention of complication 07/28/2008   HTN (hypertension)    Hyperlipidemia    Insomnia, unspecified 11/18/2004   Internal hemorrhoids without mention of complication 02/28/2008   Left bundle branch hemiblock    Loss of weight 03/05/2012   Myalgia and myositis, unspecified 01/05/2009   Orthostatic hypotension 03/22/2007   Other malaise and fatigue 03/05/2012   Pain in joint, site unspecified 02/28/2008   Palpitations 11/21/2006   Personality change due to conditions classified elsewhere    Pterygium eye, right 06/07/2020   Rectocele 09/23/2012   Restless legs syndrome (RLS)    Rosacea 09/23/2012   Sebaceous cyst 03/01/2011   Thumb pain 11/20/2012   Unspecified hereditary and idiopathic peripheral neuropathy     Past Surgical History:  Procedure Laterality Date   ADENOIDECTOMY     BLADDER SUSPENSION  1998   CATARACT EXTRACTION W/ INTRAOCULAR LENS IMPLANT Right 11/20/2006   Dr. Carrie   catheter ablation  2008   for atrial tachycardia   CYST EXCISION  1998   mucus cyst removal from finger   PTERYGIUM EXCISION  06/07/2020   TONSILLECTOMY     TOTAL ABDOMINAL HYSTERECTOMY  2001   uterine bleeding    Allergies  Allergen Reactions   Erythromycin Nausea And Vomiting   Flecainide Acetate     Postural hypotension   Penicillins     Outpatient Encounter Medications as of  04/28/2024  Medication Sig   acetaminophen  (TYLENOL ) 500 MG tablet Take 1,000 mg by mouth every 8 (eight) hours as needed.   albuterol  (VENTOLIN  HFA) 108 (90 Base) MCG/ACT inhaler Inhale 2 puffs into the lungs every 6 (six) hours as needed for wheezing or shortness of breath.   alendronate  (FOSAMAX ) 70 MG tablet Take 1 tablet (70 mg total) by mouth every 7 (seven) days. Take with a full glass of water on an empty stomach. Sit upright for 30 min after administration   amiodarone  (PACERONE ) 100  MG tablet Take 100 mg by mouth daily.   amLODipine  (NORVASC ) 2.5 MG tablet Take 2.5 mg by mouth daily. 5mg , oral, once a morning   Artificial Saliva (BIOTENE DRY MOUTH MOIST SPRAY) SOLN Use as directed 1 spray in the mouth or throat 2 (two) times daily. Give 1 spray by mouth two times a day for dry mouth   Artificial Tear Solution (GENTEAL TEARS OP) Apply 1 drop to eye 2 (two) times daily.   Carbinoxamine  Maleate 4 MG TABS Take 1 tablet (4 mg total) by mouth in the morning and at bedtime.   cholecalciferol (VITAMIN D3) 25 MCG (1000 UNIT) tablet Take 1,000 Units by mouth daily.   diclofenac  Sodium (VOLTAREN ) 1 % GEL Apply topically 2 (two) times daily. May self apply to left knee/hip prn discomfort   famotidine (PEPCID) 20 MG tablet Take 20 mg by mouth 2 (two) times daily.   furosemide  (LASIX ) 20 MG tablet Take 1 tablet (20 mg total) by mouth daily as needed (for increased lower leg swelling.).   guaifenesin (ROBITUSSIN) 100 MG/5ML syrup Take 5 mLs by mouth 4 (four) times daily as needed for cough. Give 5 ml orally every 6 hours as needed for cough   hydrALAZINE  (APRESOLINE ) 10 MG tablet Take 10 mg by mouth as needed. One tab, oral, Three Times a day - PRN, Give PRN for SBP greater than or equal to 170   hydrALAZINE  (APRESOLINE ) 25 MG tablet Take 25 mg by mouth 2 (two) times daily. 25mg , oral, Twice a day, DO NOT HOLD HYDRALAZINE  UNLESS SBP IS <100.   ipratropium (ATROVENT ) 0.03 % nasal spray Place 2 sprays into both nostrils in the morning and at bedtime.   levalbuterol  (XOPENEX ) 0.63 MG/3ML nebulizer solution Take 0.63 mg by nebulization 2 (two) times daily as needed for wheezing or shortness of breath.   losartan  (COZAAR ) 25 MG tablet Take 1 tablet (25 mg total) by mouth every evening.   METAMUCIL FIBER PO Take by mouth at bedtime.   montelukast (SINGULAIR) 10 MG tablet Take 10 mg by mouth at bedtime. 10 mg, oral, At bedtime   Multiple Vitamins-Minerals (MULTIVITAMIN ADULTS 50+) TABS Take 1 tablet  by mouth daily.   Multiple Vitamins-Minerals (PRESERVISION AREDS PO) Take 2 tablets by mouth daily.    Rivaroxaban  (XARELTO ) 15 MG TABS tablet Take 1 tablet (15 mg total) by mouth daily with supper.   rosuvastatin  (CRESTOR ) 10 MG tablet Take 1 tablet (10 mg total) by mouth daily.   Sod Fluoride-Potassium Nitrate (PREVIDENT 5000 ENAMEL PROTECT) 1.1-5 % GEL Place 1 Application onto teeth at bedtime.   sodium chloride  (SALINE MIST) 0.65 % nasal spray Place 1 spray into the nose at bedtime as needed for congestion. 1, nasal, At bedtime- PRN, May keep at bedside for occasional use during the night for dryness   triamcinolone  (NASACORT ) 55 MCG/ACT AERO nasal inhaler Place 1 spray into the nose daily.   [DISCONTINUED] acetaminophen  (TYLENOL ) 500 MG tablet  Take 500 mg by mouth 2 (two) times daily. Tablet 500 mg amt: 1000 mg; oral twice a day: 8:00 am - 11:00 am , 8:00 pm - 10:00 pm (Patient not taking: Reported on 04/28/2024)   [DISCONTINUED] fexofenadine  (ALLEGRA  ALLERGY) 180 MG tablet Take 1 tablet (180 mg total) by mouth daily. (Patient not taking: Reported on 04/28/2024)   [DISCONTINUED] ipratropium (ATROVENT ) 0.06 % nasal spray Place 2 sprays into both nostrils 3 (three) times daily. (Patient not taking: Reported on 04/28/2024)   No facility-administered encounter medications on file as of 04/28/2024.    Review of Systems:  Review of Systems  Constitutional:  Negative for activity change, appetite change, chills, diaphoresis, fatigue, fever and unexpected weight change.  HENT:  Positive for hearing loss and rhinorrhea. Negative for congestion, ear discharge, ear pain, sore throat, trouble swallowing and voice change.   Respiratory:  Negative for cough, shortness of breath and wheezing.   Cardiovascular:  Positive for leg swelling. Negative for chest pain and palpitations.  Gastrointestinal:  Negative for abdominal distention, abdominal pain, constipation and diarrhea.  Genitourinary:  Negative for  difficulty urinating and dysuria.  Musculoskeletal:  Positive for gait problem. Negative for arthralgias, back pain, joint swelling and myalgias.  Neurological:  Negative for dizziness, tremors, seizures, syncope, facial asymmetry, speech difficulty, weakness, light-headedness, numbness and headaches.  Psychiatric/Behavioral:  Negative for agitation, behavioral problems and confusion.        Memory loss    Health Maintenance  Topic Date Due   Medicare Annual Wellness (AWV)  12/25/2023   Influenza Vaccine  06/16/2024 (Originally 03/14/2024)   COVID-19 Vaccine (8 - 2025-26 season) 06/17/2024 (Originally 04/14/2024)   DTaP/Tdap/Td (3 - Td or Tdap) 07/14/2025   Pneumococcal Vaccine: 50+ Years  Completed   DEXA SCAN  Completed   Zoster Vaccines- Shingrix  Completed   HPV VACCINES  Aged Out   Meningococcal B Vaccine  Aged Out    Physical Exam: Vitals:   04/28/24 1325  BP: 118/62  Pulse: 61  Temp: 98.1 F (36.7 C)  SpO2: 94%  Weight: 168 lb 12.8 oz (76.6 kg)  Height: 5' 3 (1.6 m)     Body mass index is 29.9 kg/m. Physical Exam Vitals and nursing note reviewed.  Constitutional:      General: She is not in acute distress.    Appearance: She is not diaphoretic.  HENT:     Head: Normocephalic and atraumatic.     Right Ear: Tympanic membrane, ear canal and external ear normal.     Left Ear: Tympanic membrane, ear canal and external ear normal.     Nose: Congestion present. No rhinorrhea.     Mouth/Throat:     Mouth: Mucous membranes are moist.     Pharynx: Oropharynx is clear.  Eyes:     Conjunctiva/sclera: Conjunctivae normal.     Pupils: Pupils are equal, round, and reactive to light.     Comments: Dry eyes with erythema to sclera  Neck:     Vascular: No JVD.  Cardiovascular:     Rate and Rhythm: Normal rate and regular rhythm.     Heart sounds: No murmur heard. Pulmonary:     Effort: Pulmonary effort is normal. No respiratory distress.     Breath sounds: Normal breath  sounds. No wheezing.  Abdominal:     General: Abdomen is flat. Bowel sounds are normal. There is no distension.     Palpations: Abdomen is soft.     Tenderness: There is no abdominal  tenderness.  Musculoskeletal:     Left shoulder: No deformity, effusion, laceration, tenderness, bony tenderness or crepitus. Decreased range of motion. Normal strength. Normal pulse.     Cervical back: No rigidity or tenderness.     Comments:    Lymphadenopathy:     Cervical: No cervical adenopathy.  Skin:    General: Skin is warm and dry.  Neurological:     Mental Status: She is alert and oriented to person, place, and time.  Psychiatric:        Mood and Affect: Mood normal.     Labs reviewed: Basic Metabolic Panel: Recent Labs    09/20/23 0000  NA 136*  K 4.1  CL 103  CO2 22  BUN 21  CREATININE 0.8  CALCIUM  8.4*  TSH 3.60   Liver Function Tests: Recent Labs    09/20/23 0000  AST 22  ALT 15  ALKPHOS 54  ALBUMIN 3.9   No results for input(s): LIPASE, AMYLASE in the last 8760 hours. No results for input(s): AMMONIA in the last 8760 hours. CBC: Recent Labs    09/20/23 0000  WBC 4.0  HGB 13.5  HCT 40  PLT 241   Lipid Panel: No results for input(s): CHOL, HDL, LDLCALC, TRIG, CHOLHDL, LDLDIRECT in the last 8760 hours.   No results found for: HGBA1C  Procedures since last visit: No results found.    Assessment and Plan Assessment & Plan Chronic nasal congestion and cough Chronic nasal congestion and cough without severe symptoms or environmental allergies. Current treatment provides some improvement. - Continue Atrovent  nasal spray. - Continue carboxylamine. - Continue steroid nasal spray once daily. -follow up with allergy  Atrial fibrillation Atrial fibrillation managed with amiodarone , requiring regular monitoring. - Order blood work to monitor liver enzymes, thyroid  function, blood counts, and electrolytes. -rate is controlled On xarelto   for CVA risk reduction   Chronic lower extremity edema Chronic edema with minimal swelling, managed with compression hose. No diuretic therapy needed currently. - Continue use of compression hose. - Use Lasix  as needed for increased swelling.  Osteoporosis Osteoporosis managed with Fosamax  and regular exercise. - Continue Fosamax . - Encourage regular exercise.  General Health Maintenance Vaccinations mostly up to date. Pneumonia vaccine due. - Administer pneumonia vaccine. - Schedule blood work for tomorrow.  MCI Doing well in [place]  HTN Controlled  HLD Continue crestor  Lab Results  Component Value Date   LDLCALC 90 04/24/2023     Next appt:  3 months with Dr Charlanne   Needs CBC CMP and TSH monitoring due to amiodarone    Total time :  time greater than 50% of total time spent doing pt counseling and coordination of care

## 2024-04-29 DIAGNOSIS — I10 Essential (primary) hypertension: Secondary | ICD-10-CM | POA: Diagnosis not present

## 2024-04-29 DIAGNOSIS — E785 Hyperlipidemia, unspecified: Secondary | ICD-10-CM | POA: Diagnosis not present

## 2024-04-29 DIAGNOSIS — R799 Abnormal finding of blood chemistry, unspecified: Secondary | ICD-10-CM | POA: Diagnosis not present

## 2024-04-29 LAB — COMPREHENSIVE METABOLIC PANEL WITH GFR
Albumin: 4 (ref 3.5–5.0)
Calcium: 9.1 (ref 8.7–10.7)
Globulin: 1.8
eGFR: 61

## 2024-04-29 LAB — BASIC METABOLIC PANEL WITH GFR
BUN: 20 (ref 4–21)
CO2: 24 — AB (ref 13–22)
Chloride: 104 (ref 99–108)
Creatinine: 0.9 (ref 0.5–1.1)
Glucose: 88
Potassium: 4.2 meq/L (ref 3.5–5.1)
Sodium: 140 (ref 137–147)

## 2024-04-29 LAB — CBC AND DIFFERENTIAL
HCT: 41 (ref 36–46)
Hemoglobin: 13.8 (ref 12.0–16.0)
Platelets: 254 K/uL (ref 150–400)
WBC: 4.2

## 2024-04-29 LAB — HEPATIC FUNCTION PANEL
ALT: 21 U/L (ref 7–35)
AST: 26 (ref 13–35)
Alkaline Phosphatase: 57 (ref 25–125)
Bilirubin, Total: 0.3

## 2024-04-29 LAB — LIPID PANEL
Cholesterol: 155 (ref 0–200)
HDL: 60 (ref 35–70)
LDL Cholesterol: 81
LDl/HDL Ratio: 2.6
Triglycerides: 67 (ref 40–160)

## 2024-04-29 LAB — CBC: RBC: 4.16 (ref 3.87–5.11)

## 2024-04-29 LAB — TSH: TSH: 3.05 (ref 0.41–5.90)

## 2024-05-01 DIAGNOSIS — D485 Neoplasm of uncertain behavior of skin: Secondary | ICD-10-CM | POA: Diagnosis not present

## 2024-05-01 DIAGNOSIS — L910 Hypertrophic scar: Secondary | ICD-10-CM | POA: Diagnosis not present

## 2024-05-01 DIAGNOSIS — L814 Other melanin hyperpigmentation: Secondary | ICD-10-CM | POA: Diagnosis not present

## 2024-05-01 DIAGNOSIS — L821 Other seborrheic keratosis: Secondary | ICD-10-CM | POA: Diagnosis not present

## 2024-05-01 DIAGNOSIS — Z85828 Personal history of other malignant neoplasm of skin: Secondary | ICD-10-CM | POA: Diagnosis not present

## 2024-05-01 DIAGNOSIS — L570 Actinic keratosis: Secondary | ICD-10-CM | POA: Diagnosis not present

## 2024-05-01 DIAGNOSIS — D045 Carcinoma in situ of skin of trunk: Secondary | ICD-10-CM | POA: Diagnosis not present

## 2024-05-13 DIAGNOSIS — H43813 Vitreous degeneration, bilateral: Secondary | ICD-10-CM | POA: Diagnosis not present

## 2024-05-13 DIAGNOSIS — H353231 Exudative age-related macular degeneration, bilateral, with active choroidal neovascularization: Secondary | ICD-10-CM | POA: Diagnosis not present

## 2024-05-13 DIAGNOSIS — H04123 Dry eye syndrome of bilateral lacrimal glands: Secondary | ICD-10-CM | POA: Diagnosis not present

## 2024-05-13 DIAGNOSIS — H43393 Other vitreous opacities, bilateral: Secondary | ICD-10-CM | POA: Diagnosis not present

## 2024-05-27 ENCOUNTER — Ambulatory Visit: Admitting: Allergy & Immunology

## 2024-05-27 ENCOUNTER — Encounter: Payer: Self-pay | Admitting: Allergy & Immunology

## 2024-05-27 ENCOUNTER — Other Ambulatory Visit: Payer: Self-pay

## 2024-05-27 VITALS — BP 130/70 | HR 89 | Temp 98.0°F | Resp 18 | Ht 62.21 in | Wt 171.5 lb

## 2024-05-27 DIAGNOSIS — K219 Gastro-esophageal reflux disease without esophagitis: Secondary | ICD-10-CM

## 2024-05-27 DIAGNOSIS — R0982 Postnasal drip: Secondary | ICD-10-CM | POA: Diagnosis not present

## 2024-05-27 DIAGNOSIS — R053 Chronic cough: Secondary | ICD-10-CM

## 2024-05-27 NOTE — Patient Instructions (Addendum)
 1. Postnasal drip - Testing was negative to the entire panel via the blood. - We did not follow up with skin testing.  - Continue with the Atrovent  higher dose as you are doing.  - Continue with the carbinoxamine  4 mg every 12 hours to see if this helps with your postnasal drip. - We can send in refills whenever they are needed.   2. Return in about 1 year (around 05/27/2025). You can have the follow up appointment with Dr. Iva or a Nurse Practicioner (our Nurse Practitioners are excellent and always have Physician oversight!).    Please inform us  of any Emergency Department visits, hospitalizations, or changes in symptoms. Call us  before going to the ED for breathing or allergy symptoms since we might be able to fit you in for a sick visit. Feel free to contact us  anytime with any questions, problems, or concerns.  It was a pleasure to see you again today!  Websites that have reliable patient information: 1. American Academy of Asthma, Allergy, and Immunology: www.aaaai.org 2. Food Allergy Research and Education (FARE): foodallergy.org 3. Mothers of Asthmatics: http://www.asthmacommunitynetwork.org 4. American College of Allergy, Asthma, and Immunology: www.acaai.org      "Like" us  on Facebook and Instagram for our latest updates!      A healthy democracy works best when [employer] participate! Make sure you are registered to vote! If you have moved or changed any of your contact information, you will need to get this updated before voting! Scan the QR codes below to learn more!

## 2024-05-27 NOTE — Progress Notes (Signed)
 FOLLOW UP  Date of Service/Encounter:  05/27/24   Assessment:   Postnasal drip - with non-allergic rhinitis per lab work  Cough - likely secondary to postnasal drip   GERD - not responsive to famotidine   Complicated past medical history including hypertension, atrial fibrillation, squamous cell carcinoma, gout, and hypercholesterolemia  Resident of [facility]    Plan/Recommendations:   1. Postnasal drip - Testing was negative to the entire panel via the blood. - We did not follow up with skin testing.  - Continue with the Atrovent  higher dose as you are doing.  - Continue with the carbinoxamine  4 mg every 12 hours to see if this helps with your postnasal drip. - We can send in refills whenever they are needed.   2. Return in about 1 year (around 05/27/2025). You can have the follow up appointment with Dr. Iva or a Nurse Practicioner (our Nurse Practitioners are excellent and always have Physician oversight!).   Subjective:   Rebekah  Graham is a 89 y.o. female presenting today for follow up of  Chief Complaint  Patient presents with  . Follow-up    Persistent  cough has had it for a while, cough is more worst in the morning than any other time of day.    Rebekah  Graham has a history of the following: Patient Active Problem List   Diagnosis Date Noted  . Chronic allergic rhinitis 04/28/2024  . Left shoulder pain 04/28/2024  . Age-related osteoporosis without current pathological fracture 01/13/2023  . Mild cognitive impairment 09/05/2021  . Unsteady gait when walking 07/20/2021  . Stenosis of left carotid artery 11/08/2020  . Hypercoagulable state due to atrial fibrillation (HCC) 05/05/2020  . Pure hypercholesterolemia 10/30/2018  . Bilateral hearing loss due to cerumen impaction 04/04/2017  . Dupuytren's disease of palm of right hand 04/04/2017  . Squamous cell carcinoma, leg 12/30/2015  . Nonexudative macular degeneration 01/04/2015  . Nuclear sclerotic  cataract of left eye 03/30/2014  . Long term current use of anticoagulant therapy 03/30/2014  . Diarrhea 09/11/2013  . Hyperlipidemia   . Lens replaced 03/19/2013  . Pseudophakia 03/19/2013  . H/O nonmelanoma skin cancer 03/04/2013  . Ectropion 01/28/2013  . Cataract 09/19/2011  . Corneal opacity 09/19/2011  . Blepharochalasis 09/19/2011  . Pterygium 09/19/2011  . Left corneal scar 09/19/2011  . Pseudoptosis 09/19/2011  . 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 Pterygium of both eyes 09/19/2011  . Essential hypertension 11/23/2009  . Atrial fibrillation (HCC) 11/23/2009  . Venous insufficiency of both lower extremities 11/21/2006  . Insomnia, unspecified 11/18/2004    History obtained from: chart review and patient.  Discussed the use of AI scribe software for clinical note transcription with the patient and/or guardian, who gave verbal consent to proceed.  Rebekah Graham  is a 89 y.o. female presenting for a follow up visit. She was last seen in July 2025.  At that time, she was experiencing postnasal drip.  We stopped the Allegra  and started carbinoxamine  1 to 2 tablets daily.  We increased her Atrovent  to a higher dose as well.  We obtain lab work to look for environmental allergies and this was completely negative.  Since last visit, she has mostly done well.   Asthma/Respiratory Symptom History: She has a morning cough that is light and non productive. This is a dry cough.  It lasts for around a few minutes. She describes a dry, non-productive morning cough that occurs almost daily but does not last long. The cough is primarily present in  the morning and subsides after a few coughs. Deep breathing can trigger the cough, but there is no associated heartburn. Her current medications include an albuterol  inhaler and a nasal spray.  Allergic Rhinitis Symptom History: She has a fair amount of postnasal drip. She thinks it is a bit better than last time when we saw her. She experiences  persistent nasal drip despite using a higher dose of nasal spray and new allergy medication. Although the symptoms have improved, she still experiences significant nasal dripping. Previous allergy testing and blood work were satisfactory.  No recent sinus infections. She recently received her COVID and flu vaccinations without any adverse effects.  She has been living at her current residence for almost ten years, a place initiated by her [facility] in [place]. She used to participate in water aerobics every morning but stopped due to the timing of breakfast.     Otherwise, there have been no changes to her past medical history, surgical history, family history, or social history.    Review of systems otherwise negative other than that mentioned in the HPI.    Objective:   Blood pressure 130/70, pulse 89, temperature 98 F (36.7 C), temperature source Temporal, resp. rate 18, height 5' 2.21 (1.58 m), weight 171 lb 8 oz (77.8 kg), SpO2 95%. Body mass index is 31.16 kg/m.    Physical Exam Vitals reviewed.  Constitutional:      Appearance: Normal appearance. She is well-developed.     Comments: Interactive.  Talkative.  HENT:     Head: Normocephalic and atraumatic.     Right Ear: Tympanic membrane, ear canal and external ear normal. No drainage, swelling or tenderness. Tympanic membrane is not injected, scarred, erythematous, retracted or bulging.     Left Ear: Tympanic membrane, ear canal and external ear normal. No drainage, swelling or tenderness. Tympanic membrane is not injected, scarred, erythematous, retracted or bulging.     Nose: No nasal deformity, septal deviation, mucosal edema or rhinorrhea.     Right Turbinates: Enlarged, swollen and pale.     Left Turbinates: Enlarged, swollen and pale.     Right Sinus: No maxillary sinus tenderness or frontal sinus tenderness.     Left Sinus: No maxillary sinus tenderness or frontal sinus tenderness.     Comments: No  polyps.    Mouth/Throat:     Lips: Pink.     Mouth: Mucous membranes are moist. Mucous membranes are not pale and not dry.     Pharynx: Uvula midline.     Comments: Mild cobblestoning. Eyes:     General: Lids are normal. Allergic shiner present.        Right eye: No discharge.        Left eye: No discharge.     Conjunctiva/sclera: Conjunctivae normal.     Right eye: Right conjunctiva is not injected. No chemosis.    Left eye: Left conjunctiva is not injected. No chemosis.    Pupils: Pupils are equal, round, and reactive to light.  Cardiovascular:     Rate and Rhythm: Normal rate and regular rhythm.     Heart sounds: Normal heart sounds.  Pulmonary:     Effort: Pulmonary effort is normal. No tachypnea, accessory muscle usage or respiratory distress.     Breath sounds: Normal breath sounds. No wheezing, rhonchi or rales.     Comments: Moving air well in all lung fields.  No increased work of breathing. Chest:     Chest wall: No tenderness.  Abdominal:     Tenderness: There is no abdominal tenderness. There is no guarding or rebound.  Lymphadenopathy:     Head:     Right side of head: No submandibular, tonsillar or occipital adenopathy.     Left side of head: No submandibular, tonsillar or occipital adenopathy.     Cervical: No cervical adenopathy.  Skin:    Coloration: Skin is not pale.     Findings: No abrasion, erythema, petechiae or rash. Rash is not papular, urticarial or vesicular.  Neurological:     Mental Status: [name]  Psychiatric:        Behavior: Behavior is cooperative.      Diagnostic studies: none     Marty Shaggy, MD  [facility] 

## 2024-06-24 DIAGNOSIS — M79671 Pain in right foot: Secondary | ICD-10-CM | POA: Diagnosis not present

## 2024-06-24 DIAGNOSIS — M79672 Pain in left foot: Secondary | ICD-10-CM | POA: Diagnosis not present

## 2024-06-24 DIAGNOSIS — B351 Tinea unguium: Secondary | ICD-10-CM | POA: Diagnosis not present

## 2024-07-29 ENCOUNTER — Encounter: Payer: Self-pay | Admitting: Internal Medicine

## 2024-07-29 ENCOUNTER — Non-Acute Institutional Stay: Payer: Self-pay | Admitting: Internal Medicine

## 2024-07-29 VITALS — BP 136/80 | HR 79 | Temp 98.0°F | Wt 170.4 lb

## 2024-07-29 DIAGNOSIS — R635 Abnormal weight gain: Secondary | ICD-10-CM

## 2024-07-29 DIAGNOSIS — M810 Age-related osteoporosis without current pathological fracture: Secondary | ICD-10-CM

## 2024-07-29 DIAGNOSIS — G3184 Mild cognitive impairment, so stated: Secondary | ICD-10-CM

## 2024-07-29 DIAGNOSIS — H353190 Nonexudative age-related macular degeneration, unspecified eye, stage unspecified: Secondary | ICD-10-CM

## 2024-07-29 DIAGNOSIS — I10 Essential (primary) hypertension: Secondary | ICD-10-CM

## 2024-07-29 DIAGNOSIS — E7800 Pure hypercholesterolemia, unspecified: Secondary | ICD-10-CM

## 2024-07-29 DIAGNOSIS — J309 Allergic rhinitis, unspecified: Secondary | ICD-10-CM | POA: Diagnosis not present

## 2024-07-29 DIAGNOSIS — I4819 Other persistent atrial fibrillation: Secondary | ICD-10-CM

## 2024-07-29 NOTE — Progress Notes (Signed)
 Location:  [facility] Editor of Service:  Clinic (12)  Provider:   Code Status: DNR Goals of Care:     10/22/2023    1:11 PM  Advanced Directives  Does Patient Have a Medical Advance Directive? Yes  Type of Estate Agent of [place];Living will;Out of facility DNR (pink MOST or yellow form)  Does patient want to make changes to medical advance directive? No - Patient declined  Copy of Healthcare Power of Attorney in Chart? Yes - validated most recent copy scanned in chart (See row information)     Chief Complaint  Patient presents with   Follow-up    3 month follow up Patient is concerns about a cough she has almost every morning.    HPI: Patient is a 89 y.o. female seen today for medical management of chronic diseases.   Lives in [place]   Patient has a history of PAF, HLD, chronic venous insufficiency, left carotid stenosis and hypertension   Allergic rhinitis  Has Seen by ENT and Allergist Most Likely Non Allergic Rhinitis On Treatment Still has Symptoms    Discussed the use of AI scribe software for clinical note transcription with the patient, who gave verbal consent to proceed.  History of Present Illness   Rebekah Graham is a 89 year old female who presents with postnasal drip and concerns about weight gain.  She has persistent postnasal drip with a morning hacking cough, worse when moving between rooms. She attributes this to allergies but is unsure of the trigger and does not recall prior effective treatments.  She is concerned about weight gain since moving to assisted living, where she now receives three meals daily and eats dessert most days. She previously did water aerobics but stopped after the move. She now exercises about four days per week with weightlifting and regular walking. She is trying to limit bread and potatoes.  She denies dizziness, pain, palpitations, or vision changes. Sleep is good with regular  bowel movements. She urinates two to three times nightly but returns to sleep easily. She notes some difficulty hearing but does not use hearing aids.      Walking well with her walker No Falls Cognitively doing well in AL  Wt Readings from Last 3 Encounters:  07/29/24 170 lb 6.4 oz (77.3 kg)  05/27/24 171 lb 8 oz (77.8 kg)  04/28/24 168 lb 12.8 oz (76.6 kg)    Past Medical History:  Diagnosis Date   Actinic keratosis 09/23/2012   Anal fissure and fistula 07/28/2008   Arthralgia of temporomandibular joint 12/30/2009   Atrial fibrillation (HCC)    Atrial tachycardia    s/p ablation 2008. [facility] (R free wall Atach)   Atrophy of vulva 09/23/2012   Blepharochalasis 03/01/2011   Cardiac dysrhythmia, unspecified 11/10/2009   Dermatophytosis of nail 02/28/2008   Dizziness and giddiness 04/03/2007   Dysphagia, unspecified(787.20) 01/17/2012   Edema 11/21/2006   External hemorrhoids without mention of complication 07/28/2008   HTN (hypertension)    Hyperlipidemia    Insomnia, unspecified 11/18/2004   Internal hemorrhoids without mention of complication 02/28/2008   Left bundle branch hemiblock    Loss of weight 03/05/2012   Myalgia and myositis, unspecified 01/05/2009   Orthostatic hypotension 03/22/2007   Other malaise and fatigue 03/05/2012   Pain in joint, site unspecified 02/28/2008   Palpitations 11/21/2006   Personality change due to conditions classified elsewhere    Pterygium eye, right 06/07/2020   Rectocele 09/23/2012  Restless legs syndrome (RLS)    Rosacea 09/23/2012   Sebaceous cyst 03/01/2011   Thumb pain 11/20/2012   Unspecified hereditary and idiopathic peripheral neuropathy     Past Surgical History:  Procedure Laterality Date   ADENOIDECTOMY     BLADDER SUSPENSION  1998   CATARACT EXTRACTION W/ INTRAOCULAR LENS IMPLANT Right 11/20/2006   Dr. Carrie   catheter ablation  2008   for atrial tachycardia   CYST EXCISION  1998   mucus cyst removal from finger   PTERYGIUM EXCISION   06/07/2020   TONSILLECTOMY     TOTAL ABDOMINAL HYSTERECTOMY  2001   uterine bleeding    Allergies[1]  Outpatient Encounter Medications as of 07/29/2024  Medication Sig   acetaminophen  (TYLENOL ) 500 MG tablet Take 1,000 mg by mouth every 8 (eight) hours as needed.   albuterol  (VENTOLIN  HFA) 108 (90 Base) MCG/ACT inhaler Inhale 2 puffs into the lungs every 6 (six) hours as needed for wheezing or shortness of breath.   alendronate  (FOSAMAX ) 70 MG tablet Take 1 tablet (70 mg total) by mouth every 7 (seven) days. Take with a full glass of water on an empty stomach. Sit upright for 30 min after administration   amiodarone  (PACERONE ) 100 MG tablet Take 100 mg by mouth daily.   amLODipine  (NORVASC ) 2.5 MG tablet Take 5 mg by mouth daily. 5mg , oral, once a morning   Artificial Saliva (BIOTENE DRY MOUTH MOIST SPRAY) SOLN Use as directed 1 spray in the mouth or throat 2 (two) times daily. Give 1 spray by mouth two times a day for dry mouth   Artificial Tear Solution (GENTEAL TEARS OP) Apply 1 drop to eye 2 (two) times daily.   Carbinoxamine  Maleate 4 MG TABS Take 1 tablet (4 mg total) by mouth in the morning and at bedtime.   cholecalciferol (VITAMIN D3) 25 MCG (1000 UNIT) tablet Take 1,000 Units by mouth daily.   diclofenac  Sodium (VOLTAREN ) 1 % GEL Apply topically 2 (two) times daily. May self apply to left knee/hip prn discomfort   famotidine (PEPCID) 20 MG tablet Take 20 mg by mouth 2 (two) times daily.   furosemide  (LASIX ) 20 MG tablet Take 1 tablet (20 mg total) by mouth daily as needed (for increased lower leg swelling.).   guaifenesin (ROBITUSSIN) 100 MG/5ML syrup Take 5 mLs by mouth 4 (four) times daily as needed for cough. Give 5 ml orally every 6 hours as needed for cough   hydrALAZINE  (APRESOLINE ) 10 MG tablet Take 10 mg by mouth as needed. One tab, oral, Three Times a day - PRN, Give PRN for SBP greater than or equal to 170   hydrALAZINE  (APRESOLINE ) 25 MG tablet Take 25 mg by mouth 2 (two)  times daily. 25mg , oral, Twice a day, DO NOT HOLD HYDRALAZINE  UNLESS SBP IS <100.   ipratropium (ATROVENT ) 0.03 % nasal spray Place 2 sprays into both nostrils in the morning and at bedtime.   levalbuterol  (XOPENEX ) 0.63 MG/3ML nebulizer solution Take 0.63 mg by nebulization 2 (two) times daily as needed for wheezing or shortness of breath.   losartan  (COZAAR ) 25 MG tablet Take 1 tablet (25 mg total) by mouth every evening.   METAMUCIL FIBER PO Take by mouth at bedtime.   montelukast (SINGULAIR) 10 MG tablet Take 10 mg by mouth at bedtime. 10 mg, oral, At bedtime   Multiple Vitamins-Minerals (MULTIVITAMIN ADULTS 50+) TABS Take 1 tablet by mouth daily.   Multiple Vitamins-Minerals (PRESERVISION AREDS PO) Take 2 tablets by mouth  daily.    Rivaroxaban  (XARELTO ) 15 MG TABS tablet Take 1 tablet (15 mg total) by mouth daily with supper.   rosuvastatin  (CRESTOR ) 10 MG tablet Take 1 tablet (10 mg total) by mouth daily.   Sod Fluoride-Potassium Nitrate (PREVIDENT 5000 ENAMEL PROTECT) 1.1-5 % GEL Place 1 Application onto teeth at bedtime.   sodium chloride  (SALINE MIST) 0.65 % nasal spray Place 1 spray into the nose at bedtime as needed for congestion. 1, nasal, At bedtime- PRN, May keep at bedside for occasional use during the night for dryness   triamcinolone  (NASACORT ) 55 MCG/ACT AERO nasal inhaler Place 1 spray into the nose daily.   No facility-administered encounter medications on file as of 07/29/2024.    Review of Systems:  Review of Systems  Constitutional:  Negative for activity change and appetite change.  HENT:  Positive for congestion.   Respiratory:  Negative for cough and shortness of breath.   Cardiovascular:  Positive for leg swelling.  Gastrointestinal:  Negative for constipation.  Genitourinary: Negative.   Musculoskeletal:  Positive for gait problem. Negative for arthralgias and myalgias.  Skin: Negative.   Neurological:  Negative for dizziness and weakness.   Psychiatric/Behavioral:  Positive for confusion. Negative for dysphoric mood and sleep disturbance.     Health Maintenance  Topic Date Due   Medicare Annual Wellness (AWV)  12/25/2023   COVID-19 Vaccine (9 - Moderna risk 2025-26 season) 11/10/2024   DTaP/Tdap/Td (3 - Td or Tdap) 07/14/2025   Pneumococcal Vaccine: 50+ Years  Completed   Influenza Vaccine  Completed   Bone Density Scan  Completed   Zoster Vaccines- Shingrix  Completed   Meningococcal B Vaccine  Aged Out   Mammogram  Discontinued    Physical Exam: Vitals:   07/29/24 0923  BP: 136/80  Pulse: 79  Temp: 98 F (36.7 C)  SpO2: 96%  Weight: 170 lb 6.4 oz (77.3 kg)   Body mass index is 30.96 kg/m. Physical Exam Vitals reviewed.  Constitutional:      Appearance: Normal appearance.  HENT:     Head: Normocephalic.     Nose: Nose normal.     Mouth/Throat:     Mouth: Mucous membranes are moist.     Pharynx: Oropharynx is clear.  Eyes:     Pupils: Pupils are equal, round, and reactive to light.  Cardiovascular:     Rate and Rhythm: Normal rate and regular rhythm.     Pulses: Normal pulses.     Heart sounds: Normal heart sounds. No murmur heard. Pulmonary:     Effort: Pulmonary effort is normal.     Breath sounds: Normal breath sounds.  Abdominal:     General: Abdomen is flat. Bowel sounds are normal.     Palpations: Abdomen is soft.  Musculoskeletal:        General: Swelling present.     Cervical back: Neck supple.  Skin:    General: Skin is warm.  Neurological:     General: No focal deficit present.     Mental Status: She is alert and oriented to person, place, and time.  Psychiatric:        Mood and Affect: Mood normal.        Thought Content: Thought content normal.     Labs reviewed: Basic Metabolic Panel: Recent Labs    09/20/23 0000 04/29/24 0000  NA 136* 140  K 4.1 4.2  CL 103 104  CO2 22 24*  BUN 21 20  CREATININE 0.8 0.9  CALCIUM  8.4* 9.1  TSH 3.60 3.05   Liver Function  Tests: Recent Labs    09/20/23 0000 04/29/24 0000  AST 22 26  ALT 15 21  ALKPHOS 54 57  ALBUMIN 3.9 4.0   No results for input(s): LIPASE, AMYLASE in the last 8760 hours. No results for input(s): AMMONIA in the last 8760 hours. CBC: Recent Labs    09/20/23 0000 04/29/24 0000  WBC 4.0 4.2  HGB 13.5 13.8  HCT 40 41  PLT 241 254   Lipid Panel: Recent Labs    04/29/24 0000  CHOL 155  HDL 60  LDLCALC 81  TRIG 67   No results found for: HGBA1C  Procedures since last visit: No results found.  Assessment/Plan 1. Chronic allergic rhinitis (Primary) On Carbinoxamine  and Atrovent  nasal spray Symptoms some what controlled  2. Mild cognitive impairment Doing well in AL  3. Essential hypertension On Amlodipine  , Hydralazine  and Cozaar    4. Persistent atrial fibrillation (HCC) Amiodarone  and Xarelto   5. Age-related osteoporosis without current pathological fracture Fosamax  since 01/13/2023   6. Pure hypercholesterolemia LDL 81 in 04/2024  7. Nonexudative age-related macular degeneration, unspecified laterality, unspecified stage   8. Weight gain Write for Small portion meal and Avoid Deserts    Labs/tests ordered:  TSH,Lipid, Vitamin D CMP CBC  Before next visit Next appt:  10/27/2024        [1]  Allergies Allergen Reactions   Erythromycin Nausea And Vomiting   Flecainide Acetate     Postural hypotension   Penicillins

## 2024-08-13 ENCOUNTER — Encounter: Payer: Self-pay | Admitting: Adult Health

## 2024-08-13 ENCOUNTER — Non-Acute Institutional Stay: Admitting: Adult Health

## 2024-08-13 DIAGNOSIS — B9789 Other viral agents as the cause of diseases classified elsewhere: Secondary | ICD-10-CM | POA: Diagnosis not present

## 2024-08-13 DIAGNOSIS — J988 Other specified respiratory disorders: Secondary | ICD-10-CM

## 2024-08-13 MED ORDER — IPRATROPIUM-ALBUTEROL 0.5-2.5 (3) MG/3ML IN SOLN: 3.0000 mL | Freq: Two times a day (BID) | RESPIRATORY_TRACT | Status: AC

## 2024-08-13 NOTE — Progress Notes (Signed)
 " Location:  [facility] Room Number: 523 P Place of Service:  ALF [phone]) Provider:  Tawni America, NP    Patient Care Team: Charlanne Fredia CROME, MD as PCP - General (Internal Medicine) Community, [facility] Krell, Claudette T, NP as Nurse Practitioner (Geriatric Medicine) Ladona Heinz, MD as Consulting Physician (Cardiology) Carrie Dunnings, MD as Consulting Physician (Ophthalmology)  Extended Emergency Contact Information Primary Emergency Contact: Husby,Stephen  [place] Home Phone: [phone] Work Phone: [phone] Relation: Son Secondary Emergency Contact: [facility] Mobile Phone: [phone] Relation: Friend  Code Status:  DNR Goals of care: Advanced Directive information    10/22/2023    1:11 PM  Advanced Directives  Does Patient Have a Medical Advance Directive? Yes  Type of Estate Agent of [place];Living will;Out of facility DNR (pink MOST or yellow form)  Does patient want to make changes to medical advance directive? No - Patient declined  Copy of Healthcare Power of Attorney in Chart? Yes - validated most recent copy scanned in chart (See row information)     Chief Complaint  Patient presents with   Cough    HPI:  Pt is a 89 y.o. female seen today for an acute visit for cough.   Rapid covid and flu swab negative x 2  Symptoms present since 12/26 with cough and wheeze Denies sore throat, bodyaches or sob.  She did have a low grade temp.  Reports soft frequent stools, not loose. No abd pain Sats normal  She does have a hx of a morning cough and allergic rhinitis but this is described as different Using guaifenesin with some relief.   Past Medical History:  Diagnosis Date   Actinic keratosis 09/23/2012   Anal fissure and fistula 07/28/2008   Arthralgia of temporomandibular joint 12/30/2009   Atrial fibrillation (HCC)    Atrial tachycardia    s/p ablation 2008. [facility] (R  free wall Atach)   Atrophy of vulva 09/23/2012   Blepharochalasis 03/01/2011   Cardiac dysrhythmia, unspecified 11/10/2009   Dermatophytosis of nail 02/28/2008   Dizziness and giddiness 04/03/2007   Dysphagia, unspecified(787.20) 01/17/2012   Edema 11/21/2006   External hemorrhoids without mention of complication 07/28/2008   HTN (hypertension)    Hyperlipidemia    Insomnia, unspecified 11/18/2004   Internal hemorrhoids without mention of complication 02/28/2008   Left bundle branch hemiblock    Loss of weight 03/05/2012   Myalgia and myositis, unspecified 01/05/2009   Orthostatic hypotension 03/22/2007   Other malaise and fatigue 03/05/2012   Pain in joint, site unspecified 02/28/2008   Palpitations 11/21/2006   Personality change due to conditions classified elsewhere    Pterygium eye, right 06/07/2020   Rectocele 09/23/2012   Restless legs syndrome (RLS)    Rosacea 09/23/2012   Sebaceous cyst 03/01/2011   Thumb pain 11/20/2012   Unspecified hereditary and idiopathic peripheral neuropathy    Past Surgical History:  Procedure Laterality Date   ADENOIDECTOMY     BLADDER SUSPENSION  1998   CATARACT EXTRACTION W/ INTRAOCULAR LENS IMPLANT Right 11/20/2006   Dr. Carrie   catheter ablation  2008   for atrial tachycardia   CYST EXCISION  1998   mucus cyst removal from finger   PTERYGIUM EXCISION  06/07/2020   TONSILLECTOMY     TOTAL ABDOMINAL HYSTERECTOMY  2001   uterine bleeding    Allergies[1]  Outpatient Encounter Medications as of 08/13/2024  Medication Sig   acetaminophen  (TYLENOL ) 500 MG tablet Take 1,000 mg  by mouth as needed. Give 2 tablet orally as needed for 2 tabs PO BID pain related to PRIMARY OSTEOARTHRITIS, RIGHT HAND (M19. 041);PAIN IN LEFT SHOULDER (M25.512);LOW BACK PAIN, UNSPECIFIED (M54.50);PAIN IN LEFT LOWER LEG (M79.662);PAIN IN RIGHT FOOT (M79. 671)   albuterol  (VENTOLIN  HFA) 108 (90 Base) MCG/ACT inhaler Inhale 2 puffs into the lungs every 6 (six) hours as needed for  wheezing or shortness of breath.   alendronate  (FOSAMAX ) 70 MG tablet Take 1 tablet (70 mg total) by mouth every 7 (seven) days. Take with a full glass of water on an empty stomach. Sit upright for 30 min after administration   amiodarone  (PACERONE ) 100 MG tablet Take 100 mg by mouth daily.   amLODipine  (NORVASC ) 2.5 MG tablet Take 5 mg by mouth daily. 5mg , oral, once a morning   Artificial Saliva (BIOTENE DRY MOUTH MOIST SPRAY) SOLN Use as directed 1 spray in the mouth or throat 2 (two) times daily. Give 1 spray by mouth two times a day for dry mouth   Artificial Tear Solution (GENTEAL TEARS OP) Apply 1 drop to eye 2 (two) times daily.   Carbinoxamine  Maleate 4 MG TABS Take 1 tablet (4 mg total) by mouth in the morning and at bedtime.   cholecalciferol (VITAMIN D3) 25 MCG (1000 UNIT) tablet Take 1,000 Units by mouth daily.   diclofenac  Sodium (VOLTAREN ) 1 % GEL Apply topically 2 (two) times daily. May self apply to left knee/hip prn discomfort   famotidine (PEPCID) 20 MG tablet Take 20 mg by mouth 2 (two) times daily.   furosemide  (LASIX ) 20 MG tablet Take 1 tablet (20 mg total) by mouth daily as needed (for increased lower leg swelling.).   guaifenesin (ROBITUSSIN) 100 MG/5ML syrup Take 5 mLs by mouth 4 (four) times daily as needed for cough. Give 5 ml orally every 6 hours as needed for cough   hydrALAZINE  (APRESOLINE ) 10 MG tablet Take 10 mg by mouth as needed. One tab, oral, Three Times a day - PRN, Give PRN for SBP greater than or equal to 170   hydrALAZINE  (APRESOLINE ) 25 MG tablet Take 25 mg by mouth 2 (two) times daily. 25mg , oral, Twice a day, DO NOT HOLD HYDRALAZINE  UNLESS SBP IS <100.   hydroxypropyl methylcellulose / hypromellose (ISOPTO TEARS / GONIOVISC) 2.5 % ophthalmic solution Place 1 drop into both eyes. Instill 1 drop in both eyes every 2 hours as needed for eye irritation   ipratropium (ATROVENT ) 0.03 % nasal spray Place 2 sprays into both nostrils in the morning and at bedtime.    levalbuterol  (XOPENEX ) 0.63 MG/3ML nebulizer solution Take 0.63 mg by nebulization 2 (two) times daily as needed for wheezing or shortness of breath.   losartan  (COZAAR ) 25 MG tablet Take 1 tablet (25 mg total) by mouth every evening.   METAMUCIL FIBER PO Take by mouth at bedtime.   montelukast (SINGULAIR) 10 MG tablet Take 10 mg by mouth at bedtime. 10 mg, oral, At bedtime   Multiple Vitamins-Minerals (MULTIVITAMIN ADULTS 50+) TABS Take 1 tablet by mouth daily.   Multiple Vitamins-Minerals (PRESERVISION AREDS PO) Take 2 tablets by mouth daily.    Rivaroxaban  (XARELTO ) 15 MG TABS tablet Take 1 tablet (15 mg total) by mouth daily with supper.   rosuvastatin  (CRESTOR ) 10 MG tablet Take 1 tablet (10 mg total) by mouth daily.   Sod Fluoride-Potassium Nitrate (PREVIDENT 5000 ENAMEL PROTECT) 1.1-5 % GEL Place 1 Application onto teeth at bedtime.   sodium chloride  (SALINE MIST) 0.65 % nasal  spray Place 1 spray into the nose at bedtime as needed for congestion. 1, nasal, At bedtime- PRN, May keep at bedside for occasional use during the night for dryness   triamcinolone  (NASACORT ) 55 MCG/ACT AERO nasal inhaler Place 1 spray into the nose daily.   No facility-administered encounter medications on file as of 08/13/2024.    Review of Systems  Constitutional:  Negative for activity change, appetite change, chills, diaphoresis, fatigue and fever.  HENT:  Positive for congestion and hearing loss. Negative for ear discharge, ear pain, sinus pressure, sinus pain, sore throat and trouble swallowing.   Respiratory:  Positive for cough and wheezing. Negative for shortness of breath.   Cardiovascular:  Negative for chest pain and leg swelling.  Gastrointestinal:  Negative for abdominal distention, abdominal pain, constipation, diarrhea, nausea and vomiting.  Genitourinary:  Negative for difficulty urinating, dysuria and urgency.  Musculoskeletal:  Positive for gait problem (uses a walker). Negative for back  pain, myalgias and neck pain.  Skin:  Negative for rash.  Neurological:  Negative for dizziness and weakness.  Psychiatric/Behavioral:  Negative for confusion.     Immunization History  Administered Date(s) Administered   Fluad Quad(high Dose 65+) 05/19/2022, 06/05/2023   INFLUENZA, HIGH DOSE SEASONAL PF 05/23/2019, 06/11/2020   Influenza Whole 06/12/2012   Influenza,inj,Quad PF,6+ Mos 05/13/2013, 06/07/2018   Influenza-Unspecified 05/27/2014, 04/24/2015, 04/13/2016, 04/24/2017, 06/11/2020, 06/11/2021, 05/13/2024   Moderna Covid-19 Fall Seasonal Vaccine 22yrs & older 12/08/2022   Moderna Covid-19 Vaccine Bivalent Booster 30yrs & up 06/21/2021, 06/05/2023   Moderna SARS-COV2 Booster Vaccination 04/18/2021   Moderna Sars-Covid-2 Vaccination 08/26/2019, 09/23/2019, 06/29/2020, 06/14/2022   PNEUMOCOCCAL CONJUGATE-20 12/27/2022   Pneumococcal Conjugate-13 05/27/2014   Pneumococcal Polysaccharide-23 08/14/1997   RSV,unspecified 08/09/2022   Td 04/15/2011   Tdap 07/15/2015   Unspecified SARS-COV-2 Vaccination 05/13/2024   Zoster Recombinant(Shingrix) 02/12/2017, 06/28/2017   Zoster, Live 08/15/2007   Pertinent  Health Maintenance Due  Topic Date Due   Influenza Vaccine  Completed   Bone Density Scan  Completed   Mammogram  Discontinued      06/26/2023    1:08 PM 07/24/2023    2:05 PM 09/24/2023    3:06 PM 10/22/2023    1:10 PM 07/29/2024    9:30 AM  Fall Risk  Falls in the past year? 0 0 0 0 0  Was there an injury with Fall? 0  0  0   0  Fall Risk Category Calculator 0 0 0  0  Patient at Risk for Falls Due to Impaired mobility;Impaired balance/gait History of fall(s);Impaired balance/gait Impaired balance/gait;Impaired mobility Impaired balance/gait;Impaired mobility Impaired balance/gait;Impaired mobility  Fall risk Follow up Falls evaluation completed Falls evaluation completed;Education provided;Falls prevention discussed Falls evaluation completed Falls evaluation completed  Falls evaluation completed     Data saved with a previous flowsheet row definition   Functional Status Survey:    Vitals:   08/13/24 0956  BP: 105/69  Pulse: 70  Resp: 19  Temp: 98.4 F (36.9 C)  SpO2: 92%  Weight: 166 lb 8 oz (75.5 kg)  Height: 5' 2.21 (1.58 m)   Body mass index is 30.25 kg/m. Physical Exam Vitals and nursing note reviewed.  Constitutional:      General: She is not in acute distress.    Appearance: She is not diaphoretic.  HENT:     Head: Normocephalic and atraumatic.     Right Ear: Tympanic membrane normal.     Left Ear: Tympanic membrane normal.     Nose: Nose  normal.     Mouth/Throat:     Mouth: Mucous membranes are moist.     Pharynx: Oropharynx is clear.  Eyes:     Conjunctiva/sclera: Conjunctivae normal.     Pupils: Pupils are equal, round, and reactive to light.  Neck:     Vascular: No JVD.  Cardiovascular:     Rate and Rhythm: Normal rate and regular rhythm.     Heart sounds: No murmur heard. Pulmonary:     Effort: Pulmonary effort is normal. No respiratory distress.     Breath sounds: Wheezing (faint exp) present.  Abdominal:     General: Abdomen is flat. Bowel sounds are normal.     Palpations: Abdomen is soft.  Skin:    General: Skin is warm and dry.  Neurological:     Mental Status: She is alert and oriented to person, place, and time.  Psychiatric:        Mood and Affect: Mood normal.     Labs reviewed: Recent Labs    09/20/23 0000 04/29/24 0000  NA 136* 140  K 4.1 4.2  CL 103 104  CO2 22 24*  BUN 21 20  CREATININE 0.8 0.9  CALCIUM  8.4* 9.1   Recent Labs    09/20/23 0000 04/29/24 0000  AST 22 26  ALT 15 21  ALKPHOS 54 57  ALBUMIN 3.9 4.0   Recent Labs    09/20/23 0000 04/29/24 0000  WBC 4.0 4.2  HGB 13.5 13.8  HCT 40 41  PLT 241 254   Lab Results  Component Value Date   TSH 3.05 04/29/2024   No results found for: HGBA1C Lab Results  Component Value Date   CHOL 155 04/29/2024   HDL 60  04/29/2024   LDLCALC 81 04/29/2024   TRIG 67 04/29/2024    Significant Diagnostic Results in last 30 days:  No results found.  Assessment/Plan  1. Viral respiratory illness (Primary)  Cough present for 6 days with no low 02 sat or sob Mild wheezing, try duonebs If not improving in the next 48 hrs obtain CXR Continue prn guaifenesin for cough  - ipratropium-albuterol  (DUONEB) 0.5-2.5 (3) MG/3ML SOLN; Take 3 mLs by nebulization in the morning and at bedtime.   Total time 20 min:  time greater than 50% of total time spent doing pt counseling and coordination of care       [1]  Allergies Allergen Reactions   Erythromycin Nausea And Vomiting   Flecainide Acetate     Postural hypotension   Penicillins    "

## 2024-10-27 ENCOUNTER — Encounter: Admitting: Adult Health
